# Patient Record
Sex: Female | Born: 1945 | Race: Asian | Hispanic: No | Smoking: Former smoker
Health system: Southern US, Community
[De-identification: ages and names within clinical notes are randomized; demographics above are authoritative.]

## PROBLEM LIST (undated history)

## (undated) DIAGNOSIS — N8110 Cystocele, unspecified: Secondary | ICD-10-CM

## (undated) DIAGNOSIS — M1990 Unspecified osteoarthritis, unspecified site: Secondary | ICD-10-CM

## (undated) DIAGNOSIS — B019 Varicella without complication: Secondary | ICD-10-CM

## (undated) DIAGNOSIS — A770 Spotted fever due to Rickettsia rickettsii: Secondary | ICD-10-CM

## (undated) DIAGNOSIS — N952 Postmenopausal atrophic vaginitis: Secondary | ICD-10-CM

## (undated) DIAGNOSIS — N83209 Unspecified ovarian cyst, unspecified side: Secondary | ICD-10-CM

## (undated) DIAGNOSIS — D649 Anemia, unspecified: Secondary | ICD-10-CM

## (undated) DIAGNOSIS — N760 Acute vaginitis: Secondary | ICD-10-CM

## (undated) DIAGNOSIS — K219 Gastro-esophageal reflux disease without esophagitis: Secondary | ICD-10-CM

## (undated) DIAGNOSIS — M249 Joint derangement, unspecified: Secondary | ICD-10-CM

## (undated) DIAGNOSIS — E785 Hyperlipidemia, unspecified: Secondary | ICD-10-CM

## (undated) DIAGNOSIS — T7840XA Allergy, unspecified, initial encounter: Secondary | ICD-10-CM

## (undated) DIAGNOSIS — N281 Cyst of kidney, acquired: Secondary | ICD-10-CM

## (undated) DIAGNOSIS — R300 Dysuria: Secondary | ICD-10-CM

## (undated) DIAGNOSIS — Z8619 Personal history of other infectious and parasitic diseases: Secondary | ICD-10-CM

## (undated) DIAGNOSIS — N6019 Diffuse cystic mastopathy of unspecified breast: Secondary | ICD-10-CM

## (undated) DIAGNOSIS — D259 Leiomyoma of uterus, unspecified: Secondary | ICD-10-CM

## (undated) DIAGNOSIS — F418 Other specified anxiety disorders: Secondary | ICD-10-CM

## (undated) HISTORY — DX: Dysuria: R30.0

## (undated) HISTORY — DX: Unspecified osteoarthritis, unspecified site: M19.90

## (undated) HISTORY — DX: Cyst of kidney, acquired: N28.1

## (undated) HISTORY — DX: Anemia, unspecified: D64.9

## (undated) HISTORY — DX: Cystocele, unspecified: N81.10

## (undated) HISTORY — DX: Allergy, unspecified, initial encounter: T78.40XA

## (undated) HISTORY — DX: Diffuse cystic mastopathy of unspecified breast: N60.19

## (undated) HISTORY — DX: Postmenopausal atrophic vaginitis: N95.2

## (undated) HISTORY — DX: Personal history of other infectious and parasitic diseases: Z86.19

## (undated) HISTORY — DX: Varicella without complication: B01.9

## (undated) HISTORY — DX: Gastro-esophageal reflux disease without esophagitis: K21.9

## (undated) HISTORY — PX: WISDOM TOOTH EXTRACTION: SHX21

## (undated) HISTORY — DX: Spotted fever due to Rickettsia rickettsii: A77.0

## (undated) HISTORY — DX: Hyperlipidemia, unspecified: E78.5

## (undated) HISTORY — DX: Leiomyoma of uterus, unspecified: D25.9

## (undated) HISTORY — DX: Unspecified ovarian cyst, unspecified side: N83.209

## (undated) HISTORY — DX: Joint derangement, unspecified: M24.9

## (undated) HISTORY — DX: Acute vaginitis: N76.0

---
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
## 1990-07-09 HISTORY — PX: HERNIA REPAIR: SHX51

## 1992-07-09 HISTORY — PX: OTHER SURGICAL HISTORY: SHX169

## 1998-12-20 ENCOUNTER — Other Ambulatory Visit: Admission: RE | Admit: 1998-12-20 | Discharge: 1998-12-20 | Payer: Self-pay | Admitting: *Deleted

## 1999-02-02 ENCOUNTER — Other Ambulatory Visit: Admission: RE | Admit: 1999-02-02 | Discharge: 1999-02-02 | Payer: Self-pay | Admitting: *Deleted

## 2000-01-25 ENCOUNTER — Other Ambulatory Visit: Admission: RE | Admit: 2000-01-25 | Discharge: 2000-01-25 | Payer: Self-pay | Admitting: *Deleted

## 2000-08-27 ENCOUNTER — Other Ambulatory Visit: Admission: RE | Admit: 2000-08-27 | Discharge: 2000-08-27 | Payer: Self-pay | Admitting: *Deleted

## 2001-05-15 ENCOUNTER — Other Ambulatory Visit: Admission: RE | Admit: 2001-05-15 | Discharge: 2001-05-15 | Payer: Self-pay | Admitting: *Deleted

## 2002-06-01 ENCOUNTER — Other Ambulatory Visit: Admission: RE | Admit: 2002-06-01 | Discharge: 2002-06-01 | Payer: Self-pay | Admitting: *Deleted

## 2003-08-23 ENCOUNTER — Ambulatory Visit (HOSPITAL_COMMUNITY): Admission: RE | Admit: 2003-08-23 | Discharge: 2003-08-23 | Payer: Self-pay | Admitting: Gastroenterology

## 2008-07-09 LAB — HM PAP SMEAR

## 2010-10-16 LAB — CBC AND DIFFERENTIAL
HCT: 41 % (ref 36–46)
Hemoglobin: 13.5 g/dL (ref 12.0–16.0)
Platelets: 280 10*3/uL (ref 150–399)
WBC: 5.4 10^3/mL

## 2010-10-16 LAB — LIPID PANEL
HDL: 92 mg/dL — AB (ref 35–70)
LDL Cholesterol: 150 mg/dL

## 2011-04-20 LAB — BASIC METABOLIC PANEL
BUN: 9 mg/dL (ref 4–21)
Glucose: 75 mg/dL

## 2011-09-13 ENCOUNTER — Encounter: Payer: Self-pay | Admitting: Family Medicine

## 2011-09-13 ENCOUNTER — Ambulatory Visit (INDEPENDENT_AMBULATORY_CARE_PROVIDER_SITE_OTHER): Payer: Medicare Other | Admitting: Family Medicine

## 2011-09-13 ENCOUNTER — Other Ambulatory Visit (HOSPITAL_COMMUNITY)
Admission: RE | Admit: 2011-09-13 | Discharge: 2011-09-13 | Disposition: A | Discharge status: Home / Self Care | Payer: Medicare Other | Source: Ambulatory Visit | Attending: Family Medicine | Admitting: Family Medicine

## 2011-09-13 VITALS — BP 111/74 | HR 82 | Temp 98.8°F | Ht 64.75 in | Wt 109.8 lb

## 2011-09-13 DIAGNOSIS — M810 Age-related osteoporosis without current pathological fracture: Secondary | ICD-10-CM | POA: Insufficient documentation

## 2011-09-13 DIAGNOSIS — R300 Dysuria: Secondary | ICD-10-CM

## 2011-09-13 DIAGNOSIS — N83209 Unspecified ovarian cyst, unspecified side: Secondary | ICD-10-CM

## 2011-09-13 DIAGNOSIS — E785 Hyperlipidemia, unspecified: Secondary | ICD-10-CM | POA: Diagnosis not present

## 2011-09-13 DIAGNOSIS — M249 Joint derangement, unspecified: Secondary | ICD-10-CM

## 2011-09-13 DIAGNOSIS — Z124 Encounter for screening for malignant neoplasm of cervix: Secondary | ICD-10-CM | POA: Insufficient documentation

## 2011-09-13 DIAGNOSIS — N760 Acute vaginitis: Secondary | ICD-10-CM

## 2011-09-13 DIAGNOSIS — N8110 Cystocele, unspecified: Secondary | ICD-10-CM

## 2011-09-13 DIAGNOSIS — N6019 Diffuse cystic mastopathy of unspecified breast: Secondary | ICD-10-CM

## 2011-09-13 DIAGNOSIS — N8111 Cystocele, midline: Secondary | ICD-10-CM

## 2011-09-13 DIAGNOSIS — D649 Anemia, unspecified: Secondary | ICD-10-CM

## 2011-09-13 DIAGNOSIS — N952 Postmenopausal atrophic vaginitis: Secondary | ICD-10-CM

## 2011-09-13 DIAGNOSIS — T7840XA Allergy, unspecified, initial encounter: Secondary | ICD-10-CM

## 2011-09-13 DIAGNOSIS — K219 Gastro-esophageal reflux disease without esophagitis: Secondary | ICD-10-CM | POA: Insufficient documentation

## 2011-09-13 HISTORY — DX: Cystocele, unspecified: N81.10

## 2011-09-13 HISTORY — DX: Acute vaginitis: N76.0

## 2011-09-13 HISTORY — DX: Joint derangement, unspecified: M24.9

## 2011-09-13 HISTORY — DX: Dysuria: R30.0

## 2011-09-13 LAB — POCT URINALYSIS DIPSTICK
Ketones, UA: NEGATIVE
Leukocytes, UA: NEGATIVE
Nitrite, UA: NEGATIVE
Protein, UA: NEGATIVE
Urobilinogen, UA: 0.2

## 2011-09-13 MED ORDER — LORATADINE 10 MG PO TABS: 10.0000 mg | ORAL_TABLET | Freq: Every day | ORAL | Status: DC | PRN

## 2011-09-13 NOTE — Assessment & Plan Note (Signed)
3-4 months of yellowish vaginal discharge, sometimes causes mild discomfort. Is not sexually active, no pap in 2-3 years. Pap and ancillary testing performed today

## 2011-09-13 NOTE — Assessment & Plan Note (Signed)
Noted on PE and patient noted urinary hesitancy and urgency, will refer to GYN for further evaluation

## 2011-09-13 NOTE — Patient Instructions (Signed)
Preventive Care for Adults, Female A healthy lifestyle and preventive care can promote health and wellness. Preventive health guidelines for women include the following key practices.  A routine yearly physical is a good way to check with your caregiver about your health and preventive screening. It is a chance to share any concerns and updates on your health, and to receive a thorough exam.   Visit your dentist for a routine exam and preventive care every 6 months. Brush your teeth twice a day and floss once a day. Good oral hygiene prevents tooth decay and gum disease.   The frequency of eye exams is based on your age, health, family medical history, use of contact lenses, and other factors. Follow your caregiver's recommendations for frequency of eye exams.   Eat a healthy diet. Foods like vegetables, fruits, whole grains, low-fat dairy products, and lean protein foods contain the nutrients you need without too many calories. Decrease your intake of foods high in solid fats, added sugars, and salt. Eat the right amount of calories for you.Get information about a proper diet from your caregiver, if necessary.   Regular physical exercise is one of the most important things you can do for your health. Most adults should get at least 150 minutes of moderate-intensity exercise (any activity that increases your heart rate and causes you to sweat) each week. In addition, most adults need muscle-strengthening exercises on 2 or more days a week.   Maintain a healthy weight. The body mass index (BMI) is a screening tool to identify possible weight problems. It provides an estimate of body fat based on height and weight. Your caregiver can help determine your BMI, and can help you achieve or maintain a healthy weight.For adults 20 years and older:   A BMI below 18.5 is considered underweight.   A BMI of 18.5 to 24.9 is normal.   A BMI of 25 to 29.9 is considered overweight.   A BMI of 30 and above is  considered obese.   Maintain normal blood lipids and cholesterol levels by exercising and minimizing your intake of saturated fat. Eat a balanced diet with plenty of fruit and vegetables. Blood tests for lipids and cholesterol should begin at age 20 and be repeated every 5 years. If your lipid or cholesterol levels are high, you are over 50, or you are at high risk for heart disease, you may need your cholesterol levels checked more frequently.Ongoing high lipid and cholesterol levels should be treated with medicines if diet and exercise are not effective.   If you smoke, find out from your caregiver how to quit. If you do not use tobacco, do not start.   If you are pregnant, do not drink alcohol. If you are breastfeeding, be very cautious about drinking alcohol. If you are not pregnant and choose to drink alcohol, do not exceed 1 drink per day. One drink is considered to be 12 ounces (355 mL) of beer, 5 ounces (148 mL) of wine, or 1.5 ounces (44 mL) of liquor.   Avoid use of street drugs. Do not share needles with anyone. Ask for help if you need support or instructions about stopping the use of drugs.   High blood pressure causes heart disease and increases the risk of stroke. Your blood pressure should be checked at least every 1 to 2 years. Ongoing high blood pressure should be treated with medicines if weight loss and exercise are not effective.   If you are 55 to 66   years old, ask your caregiver if you should take aspirin to prevent strokes.   Diabetes screening involves taking a blood sample to check your fasting blood sugar level. This should be done once every 3 years, after age 45, if you are within normal weight and without risk factors for diabetes. Testing should be considered at a younger age or be carried out more frequently if you are overweight and have at least 1 risk factor for diabetes.   Breast cancer screening is essential preventive care for women. You should practice "breast  self-awareness." This means understanding the normal appearance and feel of your breasts and may include breast self-examination. Any changes detected, no matter how small, should be reported to a caregiver. Women in their 20s and 30s should have a clinical breast exam (CBE) by a caregiver as part of a regular health exam every 1 to 3 years. After age 40, women should have a CBE every year. Starting at age 40, women should consider having a mammography (breast X-ray test) every year. Women who have a family history of breast cancer should talk to their caregiver about genetic screening. Women at a high risk of breast cancer should talk to their caregivers about having magnetic resonance imaging (MRI) and a mammography every year.   The Pap test is a screening test for cervical cancer. A Pap test can show cell changes on the cervix that might become cervical cancer if left untreated. A Pap test is a procedure in which cells are obtained and examined from the lower end of the uterus (cervix).   Women should have a Pap test starting at age 21.   Between ages 21 and 29, Pap tests should be repeated every 2 years.   Beginning at age 30, you should have a Pap test every 3 years as long as the past 3 Pap tests have been normal.   Some women have medical problems that increase the chance of getting cervical cancer. Talk to your caregiver about these problems. It is especially important to talk to your caregiver if a new problem develops soon after your last Pap test. In these cases, your caregiver may recommend more frequent screening and Pap tests.   The above recommendations are the same for women who have or have not gotten the vaccine for human papillomavirus (HPV).   If you had a hysterectomy for a problem that was not cancer or a condition that could lead to cancer, then you no longer need Pap tests. Even if you no longer need a Pap test, a regular exam is a good idea to make sure no other problems are  starting.   If you are between ages 65 and 70, and you have had normal Pap tests going back 10 years, you no longer need Pap tests. Even if you no longer need a Pap test, a regular exam is a good idea to make sure no other problems are starting.   If you have had past treatment for cervical cancer or a condition that could lead to cancer, you need Pap tests and screening for cancer for at least 20 years after your treatment.   If Pap tests have been discontinued, risk factors (such as a new sexual partner) need to be reassessed to determine if screening should be resumed.   The HPV test is an additional test that may be used for cervical cancer screening. The HPV test looks for the virus that can cause the cell changes on the cervix.   The cells collected during the Pap test can be tested for HPV. The HPV test could be used to screen women aged 30 years and older, and should be used in women of any age who have unclear Pap test results. After the age of 30, women should have HPV testing at the same frequency as a Pap test.   Colorectal cancer can be detected and often prevented. Most routine colorectal cancer screening begins at the age of 50 and continues through age 75. However, your caregiver may recommend screening at an earlier age if you have risk factors for colon cancer. On a yearly basis, your caregiver may provide home test kits to check for hidden blood in the stool. Use of a small camera at the end of a tube, to directly examine the colon (sigmoidoscopy or colonoscopy), can detect the earliest forms of colorectal cancer. Talk to your caregiver about this at age 50, when routine screening begins. Direct examination of the colon should be repeated every 5 to 10 years through age 75, unless early forms of pre-cancerous polyps or small growths are found.   Hepatitis C blood testing is recommended for all people born from 1945 through 1965 and any individual with known risks for hepatitis C.    Practice safe sex. Use condoms and avoid high-risk sexual practices to reduce the spread of sexually transmitted infections (STIs). STIs include gonorrhea, chlamydia, syphilis, trichomonas, herpes, HPV, and human immunodeficiency virus (HIV). Herpes, HIV, and HPV are viral illnesses that have no cure. They can result in disability, cancer, and death. Sexually active women aged 25 and younger should be checked for chlamydia. Older women with new or multiple partners should also be tested for chlamydia. Testing for other STIs is recommended if you are sexually active and at increased risk.   Osteoporosis is a disease in which the bones lose minerals and strength with aging. This can result in serious bone fractures. The risk of osteoporosis can be identified using a bone density scan. Women ages 65 and over and women at risk for fractures or osteoporosis should discuss screening with their caregivers. Ask your caregiver whether you should take a calcium supplement or vitamin D to reduce the rate of osteoporosis.   Menopause can be associated with physical symptoms and risks. Hormone replacement therapy is available to decrease symptoms and risks. You should talk to your caregiver about whether hormone replacement therapy is right for you.   Use sunscreen with sun protection factor (SPF) of 30 or more. Apply sunscreen liberally and repeatedly throughout the day. You should seek shade when your shadow is shorter than you. Protect yourself by wearing long sleeves, pants, a wide-brimmed hat, and sunglasses year round, whenever you are outdoors.   Once a month, do a whole body skin exam, using a mirror to look at the skin on your back. Notify your caregiver of new moles, moles that have irregular borders, moles that are larger than a pencil eraser, or moles that have changed in shape or color.   Stay current with required immunizations.   Influenza. You need a dose every fall (or winter). The composition of  the flu vaccine changes each year, so being vaccinated once is not enough.   Pneumococcal polysaccharide. You need 1 to 2 doses if you smoke cigarettes or if you have certain chronic medical conditions. You need 1 dose at age 65 (or older) if you have never been vaccinated.   Tetanus, diphtheria, pertussis (Tdap, Td). Get 1 dose of   Tdap vaccine if you are younger than age 65, are over 65 and have contact with an infant, are a healthcare worker, are pregnant, or simply want to be protected from whooping cough. After that, you need a Td booster dose every 10 years. Consult your caregiver if you have not had at least 3 tetanus and diphtheria-containing shots sometime in your life or have a deep or dirty wound.   HPV. You need this vaccine if you are a woman age 26 or younger. The vaccine is given in 3 doses over 6 months.   Measles, mumps, rubella (MMR). You need at least 1 dose of MMR if you were born in 1957 or later. You may also need a second dose.   Meningococcal. If you are age 19 to 21 and a first-year college student living in a residence hall, or have one of several medical conditions, you need to get vaccinated against meningococcal disease. You may also need additional booster doses.   Zoster (shingles). If you are age 60 or older, you should get this vaccine.   Varicella (chickenpox). If you have never had chickenpox or you were vaccinated but received only 1 dose, talk to your caregiver to find out if you need this vaccine.   Hepatitis A. You need this vaccine if you have a specific risk factor for hepatitis A virus infection or you simply wish to be protected from this disease. The vaccine is usually given as 2 doses, 6 to 18 months apart.   Hepatitis B. You need this vaccine if you have a specific risk factor for hepatitis B virus infection or you simply wish to be protected from this disease. The vaccine is given in 3 doses, usually over 6 months.  Preventive Services /  Frequency Ages 19 to 39  Blood pressure check.** / Every 1 to 2 years.   Lipid and cholesterol check.** / Every 5 years beginning at age 20.   Clinical breast exam.** / Every 3 years for women in their 20s and 30s.   Pap test.** / Every 2 years from ages 21 through 29. Every 3 years starting at age 30 through age 65 or 70 with a history of 3 consecutive normal Pap tests.   HPV screening.** / Every 3 years from ages 30 through ages 65 to 70 with a history of 3 consecutive normal Pap tests.   Hepatitis C blood test.** / For any individual with known risks for hepatitis C.   Skin self-exam. / Monthly.   Influenza immunization.** / Every year.   Pneumococcal polysaccharide immunization.** / 1 to 2 doses if you smoke cigarettes or if you have certain chronic medical conditions.   Tetanus, diphtheria, pertussis (Tdap, Td) immunization. / A one-time dose of Tdap vaccine. After that, you need a Td booster dose every 10 years.   HPV immunization. / 3 doses over 6 months, if you are 26 and younger.   Measles, mumps, rubella (MMR) immunization. / You need at least 1 dose of MMR if you were born in 1957 or later. You may also need a second dose.   Meningococcal immunization. / 1 dose if you are age 19 to 21 and a first-year college student living in a residence hall, or have one of several medical conditions, you need to get vaccinated against meningococcal disease. You may also need additional booster doses.   Varicella immunization.** / Consult your caregiver.   Hepatitis A immunization.** / Consult your caregiver. 2 doses, 6 to 18 months   apart.   Hepatitis B immunization.** / Consult your caregiver. 3 doses usually over 6 months.  Ages 40 to 64  Blood pressure check.** / Every 1 to 2 years.   Lipid and cholesterol check.** / Every 5 years beginning at age 20.   Clinical breast exam.** / Every year after age 40.   Mammogram.** / Every year beginning at age 40 and continuing for as  long as you are in good health. Consult with your caregiver.   Pap test.** / Every 3 years starting at age 30 through age 65 or 70 with a history of 3 consecutive normal Pap tests.   HPV screening.** / Every 3 years from ages 30 through ages 65 to 70 with a history of 3 consecutive normal Pap tests.   Fecal occult blood test (FOBT) of stool. / Every year beginning at age 50 and continuing until age 75. You may not need to do this test if you get a colonoscopy every 10 years.   Flexible sigmoidoscopy or colonoscopy.** / Every 5 years for a flexible sigmoidoscopy or every 10 years for a colonoscopy beginning at age 50 and continuing until age 75.   Hepatitis C blood test.** / For all people born from 1945 through 1965 and any individual with known risks for hepatitis C.   Skin self-exam. / Monthly.   Influenza immunization.** / Every year.   Pneumococcal polysaccharide immunization.** / 1 to 2 doses if you smoke cigarettes or if you have certain chronic medical conditions.   Tetanus, diphtheria, pertussis (Tdap, Td) immunization.** / A one-time dose of Tdap vaccine. After that, you need a Td booster dose every 10 years.   Measles, mumps, rubella (MMR) immunization. / You need at least 1 dose of MMR if you were born in 1957 or later. You may also need a second dose.   Varicella immunization.** / Consult your caregiver.   Meningococcal immunization.** / Consult your caregiver.   Hepatitis A immunization.** / Consult your caregiver. 2 doses, 6 to 18 months apart.   Hepatitis B immunization.** / Consult your caregiver. 3 doses, usually over 6 months.  Ages 65 and over  Blood pressure check.** / Every 1 to 2 years.   Lipid and cholesterol check.** / Every 5 years beginning at age 20.   Clinical breast exam.** / Every year after age 40.   Mammogram.** / Every year beginning at age 40 and continuing for as long as you are in good health. Consult with your caregiver.   Pap test.** /  Every 3 years starting at age 30 through age 65 or 70 with a 3 consecutive normal Pap tests. Testing can be stopped between 65 and 70 with 3 consecutive normal Pap tests and no abnormal Pap or HPV tests in the past 10 years.   HPV screening.** / Every 3 years from ages 30 through ages 65 or 70 with a history of 3 consecutive normal Pap tests. Testing can be stopped between 65 and 70 with 3 consecutive normal Pap tests and no abnormal Pap or HPV tests in the past 10 years.   Fecal occult blood test (FOBT) of stool. / Every year beginning at age 50 and continuing until age 75. You may not need to do this test if you get a colonoscopy every 10 years.   Flexible sigmoidoscopy or colonoscopy.** / Every 5 years for a flexible sigmoidoscopy or every 10 years for a colonoscopy beginning at age 50 and continuing until age 75.   Hepatitis   C blood test.** / For all people born from 1945 through 1965 and any individual with known risks for hepatitis C.   Osteoporosis screening.** / A one-time screening for women ages 65 and over and women at risk for fractures or osteoporosis.   Skin self-exam. / Monthly.   Influenza immunization.** / Every year.   Pneumococcal polysaccharide immunization.** / 1 dose at age 65 (or older) if you have never been vaccinated.   Tetanus, diphtheria, pertussis (Tdap, Td) immunization. / A one-time dose of Tdap vaccine if you are over 65 and have contact with an infant, are a healthcare worker, or simply want to be protected from whooping cough. After that, you need a Td booster dose every 10 years.   Varicella immunization.** / Consult your caregiver.   Meningococcal immunization.** / Consult your caregiver.   Hepatitis A immunization.** / Consult your caregiver. 2 doses, 6 to 18 months apart.   Hepatitis B immunization.** / Check with your caregiver. 3 doses, usually over 6 months.  ** Family history and personal history of risk and conditions may change your caregiver's  recommendations. Document Released: 08/21/2001 Document Revised: 06/14/2011 Document Reviewed: 11/20/2010 [employer] Patient Information 2012 [employer]. 

## 2011-09-14 ENCOUNTER — Encounter: Payer: Self-pay | Admitting: Family Medicine

## 2011-09-14 DIAGNOSIS — N83209 Unspecified ovarian cyst, unspecified side: Secondary | ICD-10-CM

## 2011-09-14 DIAGNOSIS — N952 Postmenopausal atrophic vaginitis: Secondary | ICD-10-CM

## 2011-09-14 DIAGNOSIS — N6019 Diffuse cystic mastopathy of unspecified breast: Secondary | ICD-10-CM

## 2011-09-14 HISTORY — DX: Unspecified ovarian cyst, unspecified side: N83.209

## 2011-09-14 HISTORY — DX: Diffuse cystic mastopathy of unspecified breast: N60.19

## 2011-09-14 HISTORY — DX: Postmenopausal atrophic vaginitis: N95.2

## 2011-09-14 NOTE — Progress Notes (Signed)
Patient ID: Kelsey [name], female   DOB: 1946-06-29, 66 y.o.   MRN: [id number] Kelsey [name] [id number] [birth year] 09/14/2011      Progress Note New Patient  Subjective  Chief Complaint  Chief Complaint  Patient presents with  . Establish Care    new patient    HPI  Patient is to use 57 female who is in today for nutrition appointment. Her largest concern is a roughly 3-4 month history of a creamy vaginal discharge. She denies odor, itching, pain. She denies fevers, chills. She's not sexually active. Denies any history of abnormal Paps. Has not had one in several years. She has had trouble with abnormal mammograms in the past mostly notable for dense tissue but never any cancer. 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 Other eye she reports generally good health she does struggle with some dyspepsia. Has a burping belching and burning feeling in her chest at times. She is very physically active and tries to keep well Past Medical History  Diagnosis Date  . Chicken pox as a child  . GERD (gastroesophageal reflux disease)   . Anemia     iron deficiency  . Hyperlipidemia   . Osteoporosis   . Allergy   . 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 H/O measles   . H/O mumps   . Female bladder prolapse 09/13/2011  . Vaginitis 09/13/2011  . Dysuria 09/13/2011    Past Surgical History  Procedure Date  . Small intestine hernia 1994    Family History  Problem Relation Age of Onset  . Heart disease Mother   . Osteoporosis Mother   . Cancer Father     prostate  . Osteoporosis Sister   . Gout Brother   . Gout Maternal Grandmother   . Heart disease Maternal Grandmother   . Alzheimer's disease Maternal Grandfather   . Osteoporosis Sister     History   Social History  . Marital Status: Married    Spouse Name: N/A    Number of Children: N/A  . Years of Education: N/A   Occupational History  . Not on file.   Social History Main Topics  . Smoking  status: Former Smoker -- 0.3 packs/day for 10 years    Types: Cigarettes    Quit date: 07/09/1978  . Smokeless tobacco: Never Used  . Alcohol Use: Yes     1-2 glasses of wine daily and a 12 oz beer daily  . Drug Use: No  . Sexually Active: No   Other Topics Concern  . Not on file   Social History Narrative  . No narrative on file    No current outpatient prescriptions on file prior to visit.    Allergies  Allergen Reactions  . Penicillins Rash  . Sulfa Antibiotics Rash    Under arm and thighs    Review of Systems  Review of Systems  Constitutional: Negative for fever, chills and malaise/fatigue.  HENT: Negative for hearing loss, nosebleeds and congestion.   Eyes: Negative for discharge.  Respiratory: Negative for cough, sputum production, shortness of breath and wheezing.   Cardiovascular: Negative for chest pain, palpitations and leg swelling.  Gastrointestinal: Positive for heartburn and nausea. Negative for vomiting, abdominal pain, diarrhea, constipation and blood in stool.  Genitourinary: Positive for urgency and frequency. Negative for dysuria and hematuria.       Change in stream, trouble initiating and flowing at a different angle  Musculoskeletal: Negative for myalgias, back pain and falls.  Skin: Negative for rash.  Neurological: Negative for dizziness, tremors, sensory change, focal weakness, loss of consciousness, weakness and headaches.  Endo/Heme/Allergies: Negative for polydipsia. Does not bruise/bleed easily.  Psychiatric/Behavioral: Negative for depression and suicidal ideas. The patient is not nervous/anxious and does not have insomnia.     Objective  BP 111/74  Pulse 82  Temp(Src) 98.8 F (37.1 C) (Temporal)  Ht 5' 4.75" (1.645 m)  Wt 109 lb 12.8 oz (49.805 kg)  BMI 18.41 kg/m2  SpO2 96%  Physical Exam  Physical Exam  Constitutional: She is oriented to person, place, and time and well-developed, well-nourished, and in no distress. No  distress.  HENT:  Head: Normocephalic and atraumatic.  Right Ear: External ear normal.  Left Ear: External ear normal.  Nose: Nose normal.  Mouth/Throat: Oropharynx is clear and moist. No oropharyngeal exudate.  Eyes: Conjunctivae are normal. Pupils are equal, round, and reactive to light. Right eye exhibits no discharge. Left eye exhibits no discharge. No scleral icterus.  Neck: Normal range of motion. Neck supple. No thyromegaly present.  Cardiovascular: Normal rate, regular rhythm, normal heart sounds and intact distal pulses.   No murmur heard. Pulmonary/Chest: Effort normal and breath sounds normal. No respiratory distress. She has no wheezes. She has no rales.  Abdominal: Soft. Bowel sounds are normal. She exhibits no distension and no mass. There is no tenderness.  Genitourinary: Uterus normal, cervix normal, right adnexa normal and left adnexa normal. Vaginal discharge found.       Yellowish vaginal discharge, bladder prolapse noted.   Musculoskeletal: Normal range of motion. She exhibits no edema and no tenderness.  Lymphadenopathy:    She has no cervical adenopathy.  Neurological: She is alert and oriented to person, place, and time. She has normal reflexes. No cranial nerve deficit. Coordination normal.  Skin: Skin is warm and dry. No rash noted. She is not diaphoretic.  Psychiatric: Mood, memory and affect normal.       Assessment & Plan  Vaginitis 3-4 months of yellowish vaginal discharge, sometimes causes mild discomfort. Is not sexually active, no pap in 2-3 years. Pap and ancillary testing performed today  Female bladder prolapse Noted on PE and patient noted urinary hesitancy and urgency, will refer to GYN for further evaluation  Hyperlipidemia Avoid trans fats and monitor lipid panel  Anemia Will check a CBC and advise after reviewing labs and old records which have been ordered today  Dysuria Check a UA and culture

## 2011-09-14 NOTE — Assessment & Plan Note (Signed)
Will check a CBC and advise after reviewing labs and old records which have been ordered today

## 2011-09-14 NOTE — Assessment & Plan Note (Signed)
Avoid trans fats and monitor lipid panel

## 2011-09-14 NOTE — Assessment & Plan Note (Addendum)
Check a UA and culture 

## 2011-09-24 MED ORDER — FLUCONAZOLE 150 MG PO TABS: 150.0000 mg | ORAL_TABLET | Freq: Once | ORAL | Status: DC

## 2011-09-24 NOTE — Progress Notes (Signed)
Addended by: Court Joy on: 09/24/2011 10:44 AM   Modules accepted: Orders

## 2011-09-27 ENCOUNTER — Encounter: Payer: Self-pay | Admitting: Obstetrics and Gynecology

## 2011-10-01 ENCOUNTER — Encounter (INDEPENDENT_AMBULATORY_CARE_PROVIDER_SITE_OTHER): Payer: Medicare Other | Admitting: Obstetrics and Gynecology

## 2011-10-01 DIAGNOSIS — N952 Postmenopausal atrophic vaginitis: Secondary | ICD-10-CM | POA: Diagnosis not present

## 2011-10-17 ENCOUNTER — Ambulatory Visit: Payer: Medicare Other | Admitting: Family Medicine

## 2011-10-24 ENCOUNTER — Encounter: Payer: Self-pay | Admitting: Family Medicine

## 2011-10-24 DIAGNOSIS — M1990 Unspecified osteoarthritis, unspecified site: Secondary | ICD-10-CM | POA: Insufficient documentation

## 2011-11-15 DIAGNOSIS — M810 Age-related osteoporosis without current pathological fracture: Secondary | ICD-10-CM | POA: Diagnosis not present

## 2011-11-16 ENCOUNTER — Other Ambulatory Visit: Payer: Self-pay

## 2011-11-16 MED ORDER — ESTRADIOL 0.1 MG/GM VA CREA: TOPICAL_CREAM | VAGINAL | Status: DC

## 2011-11-16 NOTE — Telephone Encounter (Signed)
Fax Rx for E[facility]ace [facility]   ld

## 2011-11-28 ENCOUNTER — Ambulatory Visit (INDEPENDENT_AMBULATORY_CARE_PROVIDER_SITE_OTHER): Payer: Medicare Other | Admitting: Family Medicine

## 2011-11-28 ENCOUNTER — Encounter: Payer: Self-pay | Admitting: Family Medicine

## 2011-11-28 VITALS — BP 98/67 | HR 64 | Temp 98.1°F | Ht 64.75 in | Wt 105.8 lb

## 2011-11-28 DIAGNOSIS — M810 Age-related osteoporosis without current pathological fracture: Secondary | ICD-10-CM | POA: Diagnosis not present

## 2011-11-28 DIAGNOSIS — N760 Acute vaginitis: Secondary | ICD-10-CM

## 2011-11-28 DIAGNOSIS — IMO0001 Reserved for inherently not codable concepts without codable children: Secondary | ICD-10-CM

## 2011-11-28 DIAGNOSIS — R300 Dysuria: Secondary | ICD-10-CM

## 2011-11-28 DIAGNOSIS — R634 Abnormal weight loss: Secondary | ICD-10-CM

## 2011-11-28 DIAGNOSIS — R109 Unspecified abdominal pain: Secondary | ICD-10-CM | POA: Diagnosis not present

## 2011-11-28 DIAGNOSIS — E785 Hyperlipidemia, unspecified: Secondary | ICD-10-CM | POA: Diagnosis not present

## 2011-11-28 DIAGNOSIS — K219 Gastro-esophageal reflux disease without esophagitis: Secondary | ICD-10-CM

## 2011-11-28 LAB — CBC
HCT: 37.6 % (ref 36.0–46.0)
Hemoglobin: 12.6 g/dL (ref 12.0–15.0)
RBC: 3.89 Mil/uL (ref 3.87–5.11)
WBC: 4.8 10*3/uL (ref 4.5–10.5)

## 2011-11-28 LAB — POCT URINALYSIS DIPSTICK
Ketones, UA: NEGATIVE
Leukocytes, UA: NEGATIVE
Nitrite, UA: NEGATIVE
Protein, UA: NEGATIVE
Urobilinogen, UA: 0.2
pH, UA: 7.5

## 2011-11-28 LAB — RENAL FUNCTION PANEL
BUN: 11 mg/dL (ref 6–23)
CO2: 30 mEq/L (ref 19–32)
Calcium: 9 mg/dL (ref 8.4–10.5)
Chloride: 105 mEq/L (ref 96–112)
Creatinine, Ser: 0.5 mg/dL (ref 0.4–1.2)
Sodium: 142 mEq/L (ref 135–145)

## 2011-11-28 LAB — HEPATIC FUNCTION PANEL
ALT: 22 U/L (ref 0–35)
AST: 31 U/L (ref 0–37)
Alkaline Phosphatase: 44 U/L (ref 39–117)
Total Bilirubin: 0.7 mg/dL (ref 0.3–1.2)

## 2011-11-28 LAB — LIPID PANEL
Cholesterol: 240 mg/dL — ABNORMAL HIGH (ref 0–200)
Total CHOL/HDL Ratio: 2

## 2011-11-28 MED ORDER — OMEPRAZOLE 20 MG PO CPDR: 20.0000 mg | DELAYED_RELEASE_CAPSULE | Freq: Every day | ORAL | Status: DC

## 2011-11-28 NOTE — Assessment & Plan Note (Signed)
Has been having Dexa scans annually due to her osteoporosis and active treatment, She used Reclast which was last administered in October of 2011 and has been using Prolia 60 mg Alma q 6 months since. Last Dexa scan was 01/03/2011 at [facility] and she would like to return there so we will arrange this

## 2011-11-28 NOTE — Progress Notes (Signed)
Patient ID: Kelsey [name], female   DOB: [birth year], 66 y.o.   MRN: [id number] Kelsey [name] [id number] 11-Dec-1945 11/28/2011      Progress Note-Follow Up  Subjective  Chief Complaint  Chief Complaint  Patient presents with  . Annual Exam    physical / no pap    HPI  Patient is a 52 from old female who is in today for followup on her new patient appointment. She is feeling better at this time. She was struggling with vaginitis and some have a yeast infection about appointment. She never took a Diflucan but she was referred to GYN for evaluation of possible bladder prolapse symptoms and they are now treating her for atrophic vaginitis and her symptoms have resolved. She complains of occasional low back pain upon rising but nothing severe. She is presently using polio shots every 6 months for her osteoporosis and is in need of a DEXA scan in June. Has not had any recent illness, fevers, chills, chest pain, palpitations, GI or GU complaints since she was last seen at our office.  Past Medical History  Diagnosis Date  . Chicken pox as a child  . GERD (gastroesophageal reflux disease)   . Anemia     iron deficiency  . Hyperlipidemia   . Allergy   . 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 H/O measles   . H/O mumps   . Female bladder prolapse 09/13/2011  . Vaginitis 09/13/2011  . Dysuria 09/13/2011  . Ovarian cyst 09/14/2011  . Fibrocystic breast 09/14/2011  . 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 Osteoporosis     reclast in Oct  . Osteoarthrosis     right shoulder    Past Surgical History  Procedure Date  . Small intestine hernia 1994    Family History  Problem Relation Age of Onset  . Heart disease Mother   . Osteoporosis Mother   . Cancer Father     prostate  . Osteoporosis Sister   . Gout Brother   . Gout Maternal Grandmother   . Heart disease Maternal Grandmother   . Alzheimer's disease Maternal Grandfather   . Osteoporosis Sister     History   Social History  . Marital  Status: Married    Spouse Name: N/A    Number of Children: N/A  . Years of Education: N/A   Occupational History  . Not on file.   Social History Main Topics  . Smoking status: Former Smoker -- 0.3 packs/day for 10 years    Types: Cigarettes    Quit date: 07/09/1978  . Smokeless tobacco: Never Used  . Alcohol Use: Yes     1-2 glasses of wine daily and a 12 oz beer daily  . Drug Use: No  . Sexually Active: No   Other Topics Concern  . Not on file   Social History Narrative  . No narrative on file    Current Outpatient Prescriptions on File Prior to Visit  Medication Sig Dispense Refill  . estradiol (ESTRACE VAGINAL) 0.1 MG/GM vaginal cream 1gr per vagina 3 x weekly  42.5 g  12  . Multiple Vitamin (MULTIVITAMIN) capsule Take 0.5 capsules by mouth daily.      . Omega-3 Fatty Acids (FISH OIL) 1000 MG CAPS Take 1 capsule by mouth daily.      Marland Kitchen DISCONTD: omeprazole (PRILOSEC) 20 MG capsule Take 20 mg by mouth daily.        Allergies  Allergen Reactions  . Penicillins Rash  . Sulfa Antibiotics  Rash    Under arm and thighs    Review of Systems  Review of Systems  Constitutional: Negative for fever, chills and malaise/fatigue.  HENT: Negative for hearing loss, nosebleeds and congestion.   Eyes: Negative for discharge.  Respiratory: Negative for cough, sputum production, shortness of breath and wheezing.   Cardiovascular: Negative for chest pain, palpitations and leg swelling.  Gastrointestinal: Negative for heartburn, nausea, vomiting, abdominal pain, diarrhea, constipation and blood in stool.  Genitourinary: Negative for dysuria, urgency, frequency and hematuria.  Musculoskeletal: Positive for back pain. Negative for myalgias and falls.       Low back pain upon arising, no radicular symptoms  Skin: Negative for rash.  Neurological: Negative for dizziness, tremors, sensory change, focal weakness, loss of consciousness, weakness and headaches.  Endo/Heme/Allergies:  Negative for polydipsia. Does not bruise/bleed easily.  Psychiatric/Behavioral: Negative for depression and suicidal ideas. The patient is not nervous/anxious and does not have insomnia.     Objective  BP 98/67  Pulse 64  Temp(Src) 98.1 F (36.7 C) (Temporal)  Ht 5' 4.75" (1.645 m)  Wt 105 lb 12.8 oz (47.991 kg)  BMI 17.74 kg/m2  SpO2 97%  Physical Exam  Physical Exam  Constitutional: She is oriented to person, place, and time and well-developed, well-nourished, and in no distress. No distress.  HENT:  Head: Normocephalic and atraumatic.  Right Ear: External ear normal.  Left Ear: External ear normal.  Nose: Nose normal.  Mouth/Throat: Oropharynx is clear and moist. No oropharyngeal exudate.  Eyes: Conjunctivae are normal. Pupils are equal, round, and reactive to light. Right eye exhibits no discharge. Left eye exhibits no discharge. No scleral icterus.  Neck: Normal range of motion. Neck supple. No thyromegaly present.  Cardiovascular: Normal rate, regular rhythm, normal heart sounds and intact distal pulses.   No murmur heard. Pulmonary/Chest: Effort normal and breath sounds normal. No respiratory distress. She has no wheezes. She has no rales.  Abdominal: Soft. Bowel sounds are normal. She exhibits no distension and no mass. There is no tenderness.  Musculoskeletal: Normal range of motion. She exhibits no edema and no tenderness.  Lymphadenopathy:    She has no cervical adenopathy.  Neurological: She is alert and oriented to person, place, and time. She has normal reflexes. No cranial nerve deficit. Coordination normal.  Skin: Skin is warm and dry. No rash noted. She is not diaphoretic.  Psychiatric: Mood, memory and affect normal.    No results found for this basename: TSH   Lab Results  Component Value Date   WBC 5.4 10/16/2010   HGB 13.5 10/16/2010   HCT 41 10/16/2010   PLT 280 10/16/2010   Lab Results  Component Value Date   CREATININE 0.7 04/20/2011   BUN 9  04/20/2011   NA 139 04/20/2011   K 4.9 04/20/2011   No results found for this basename: ALT, AST, GGT, ALKPHOS, BILITOT   Lab Results  Component Value Date   CHOL 258* 10/16/2010   Lab Results  Component Value Date   HDL 92* 10/16/2010   Lab Results  Component Value Date   LDLCALC 150 10/16/2010   Lab Results  Component Value Date   TRIG 77 10/16/2010   No results found for this basename: CHOLHDL     Assessment & Plan  Osteoporosis Has been having Dexa scans annually due to her osteoporosis and active treatment, She used Reclast which was last administered in October of 2011 and has been using Prolia 60 mg  q 6  months since. Last Dexa scan was 01/03/2011 at [facility] and she would like to return there so we will arrange this  Vaginitis Improved despite never having taken her diflucan. She is now using Estrace cream and following with gynecologist. She is now feeling better and has a follow up appt with them  Dysuria No symptoms at present but patient requests a repat UA  GERD (gastroesophageal reflux disease) Patient tried to decrease her Omeprazole use but did not tolerate this. Had increased abdominal bloating, CP, dyspepsia. Will refer back to her Gastroenterologist for further evaluation and she is given a refill on the Omeprazole  Hyperlipidemia Last year total choleserol was 258. Will repeat panel today.

## 2011-11-28 NOTE — Assessment & Plan Note (Signed)
No symptoms at present but patient requests a repat UA

## 2011-11-28 NOTE — Assessment & Plan Note (Signed)
Improved despite never having taken her diflucan. She is now using Estrace cream and following with gynecologist. She is now feeling better and has a follow up appt with them

## 2011-11-28 NOTE — Assessment & Plan Note (Signed)
Patient tried to decrease her Omeprazole use but did not tolerate this. Had increased abdominal bloating, CP, dyspepsia. Will refer back to her Gastroenterologist for further evaluation and she is given a refill on the Omeprazole

## 2011-11-28 NOTE — Assessment & Plan Note (Signed)
Last year total choleserol was 258. Will repeat panel today.

## 2011-11-28 NOTE — Patient Instructions (Signed)

## 2011-11-29 ENCOUNTER — Other Ambulatory Visit: Payer: Self-pay | Admitting: Family Medicine

## 2011-11-29 LAB — VITAMIN D 25 HYDROXY (VIT D DEFICIENCY, FRACTURES): Vit D, 25-Hydroxy: 32 ng/mL (ref 30–89)

## 2011-11-30 NOTE — Progress Notes (Signed)
Addended by: Baldemar Lenis R on: 11/30/2011 10:21 AM   Modules accepted: Orders

## 2011-11-30 NOTE — Progress Notes (Signed)
Addended by: Baldemar Lenis R on: 11/30/2011 04:44 PM   Modules accepted: Orders

## 2011-11-30 NOTE — Progress Notes (Signed)
Addended by: Baldemar Lenis R on: 11/30/2011 09:40 AM   Modules accepted: Orders

## 2011-12-04 ENCOUNTER — Telehealth: Payer: Self-pay | Admitting: Family Medicine

## 2011-12-04 NOTE — Telephone Encounter (Signed)
Patient is requesting a printed copy of her 11/28/11 lab results. She will pickup 12/05/11

## 2011-12-04 NOTE — Telephone Encounter (Signed)
Printed

## 2011-12-05 ENCOUNTER — Other Ambulatory Visit: Payer: Self-pay | Admitting: Family Medicine

## 2011-12-05 NOTE — Progress Notes (Signed)
Patient with documented of osteoporosis. Has had medication changes over the past couple of years that have resulted in some improvement. She has started using Prolia. We will proceed with repeat Dexa scan to assess her response

## 2011-12-06 DIAGNOSIS — K219 Gastro-esophageal reflux disease without esophagitis: Secondary | ICD-10-CM | POA: Diagnosis not present

## 2011-12-06 DIAGNOSIS — R1013 Epigastric pain: Secondary | ICD-10-CM | POA: Diagnosis not present

## 2011-12-06 DIAGNOSIS — R634 Abnormal weight loss: Secondary | ICD-10-CM | POA: Diagnosis not present

## 2011-12-06 DIAGNOSIS — R141 Gas pain: Secondary | ICD-10-CM | POA: Diagnosis not present

## 2011-12-11 ENCOUNTER — Encounter: Payer: Self-pay | Admitting: Family Medicine

## 2012-01-04 ENCOUNTER — Other Ambulatory Visit: Payer: Medicare Other

## 2012-01-04 ENCOUNTER — Telehealth: Payer: Self-pay

## 2012-01-04 DIAGNOSIS — M810 Age-related osteoporosis without current pathological fracture: Secondary | ICD-10-CM | POA: Diagnosis not present

## 2012-01-04 DIAGNOSIS — Z1231 Encounter for screening mammogram for malignant neoplasm of breast: Secondary | ICD-10-CM | POA: Diagnosis not present

## 2012-01-04 NOTE — Telephone Encounter (Signed)
I notified pt that Mammogram was normal and Bone Density showed Osteoporosis that we already knew about and there would be no change to her therapy. Pt stated understandment Mailed copies to pt per pts request

## 2012-01-14 ENCOUNTER — Encounter: Payer: Self-pay | Admitting: Family Medicine

## 2012-01-15 ENCOUNTER — Ambulatory Visit (INDEPENDENT_AMBULATORY_CARE_PROVIDER_SITE_OTHER): Payer: Medicare Other | Admitting: Obstetrics and Gynecology

## 2012-01-15 ENCOUNTER — Encounter: Payer: Self-pay | Admitting: Obstetrics and Gynecology

## 2012-01-15 VITALS — BP 88/42 | Wt 106.0 lb

## 2012-01-15 DIAGNOSIS — N952 Postmenopausal atrophic vaginitis: Secondary | ICD-10-CM | POA: Diagnosis not present

## 2012-01-15 DIAGNOSIS — Z719 Counseling, unspecified: Secondary | ICD-10-CM

## 2012-01-15 MED ORDER — ESTRADIOL 10 MCG VA TABS: 1.0000 | ORAL_TABLET | VAGINAL | Status: DC

## 2012-01-15 MED ORDER — ESTRADIOL ACETATE 0.05 MG/24HR VA RING: 1.0000 | VAGINAL_RING | VAGINAL | Status: DC

## 2012-01-15 NOTE — Progress Notes (Signed)
Subjective:  66 yo patient seen in March 2013, referred by Dr Reuel Derby at [facility] for sever vulvo-vaginal atrophy. Today pt is here for a 3 f/u on her estrace cream.pt stated that the estrace cream is working. Reports urinary flow is now normal.  Objective:  Pelvic Exam: Vulva and Vagina: much improved with pink mucosa and no petechia  Assessment:  Improved VV atrophy   Plan:  Estring every 3 months Follow-up annual exam

## 2012-02-05 DIAGNOSIS — L819 Disorder of pigmentation, unspecified: Secondary | ICD-10-CM | POA: Diagnosis not present

## 2012-03-18 DIAGNOSIS — L819 Disorder of pigmentation, unspecified: Secondary | ICD-10-CM | POA: Diagnosis not present

## 2012-09-25 ENCOUNTER — Other Ambulatory Visit: Payer: Self-pay | Admitting: Obstetrics and Gynecology

## 2012-09-25 DIAGNOSIS — Z124 Encounter for screening for malignant neoplasm of cervix: Secondary | ICD-10-CM | POA: Diagnosis not present

## 2012-09-25 DIAGNOSIS — N952 Postmenopausal atrophic vaginitis: Secondary | ICD-10-CM | POA: Diagnosis not present

## 2012-09-25 DIAGNOSIS — Z01419 Encounter for gynecological examination (general) (routine) without abnormal findings: Secondary | ICD-10-CM | POA: Diagnosis not present

## 2012-09-25 DIAGNOSIS — Z79890 Hormone replacement therapy (postmenopausal): Secondary | ICD-10-CM | POA: Diagnosis not present

## 2012-10-10 DIAGNOSIS — H2510 Age-related nuclear cataract, unspecified eye: Secondary | ICD-10-CM | POA: Diagnosis not present

## 2012-10-10 DIAGNOSIS — H5210 Myopia, unspecified eye: Secondary | ICD-10-CM | POA: Diagnosis not present

## 2012-10-14 DIAGNOSIS — N840 Polyp of corpus uteri: Secondary | ICD-10-CM | POA: Diagnosis not present

## 2012-10-14 DIAGNOSIS — N952 Postmenopausal atrophic vaginitis: Secondary | ICD-10-CM | POA: Diagnosis not present

## 2012-10-14 DIAGNOSIS — N950 Postmenopausal bleeding: Secondary | ICD-10-CM | POA: Diagnosis not present

## 2012-10-27 ENCOUNTER — Encounter: Payer: Self-pay | Admitting: Obstetrics and Gynecology

## 2012-10-27 ENCOUNTER — Other Ambulatory Visit: Payer: Self-pay | Admitting: Obstetrics and Gynecology

## 2012-10-30 ENCOUNTER — Encounter (HOSPITAL_COMMUNITY): Payer: Self-pay | Admitting: Pharmacist

## 2012-11-04 ENCOUNTER — Encounter (HOSPITAL_COMMUNITY): Payer: Self-pay

## 2012-11-04 ENCOUNTER — Encounter (HOSPITAL_COMMUNITY)
Admission: RE | Admit: 2012-11-04 | Discharge: 2012-11-04 | Disposition: A | Discharge status: Home / Self Care | Payer: Medicare Other | Source: Ambulatory Visit | Attending: Obstetrics and Gynecology | Admitting: Obstetrics and Gynecology

## 2012-11-04 DIAGNOSIS — N950 Postmenopausal bleeding: Secondary | ICD-10-CM | POA: Diagnosis not present

## 2012-11-04 DIAGNOSIS — N854 Malposition of uterus: Secondary | ICD-10-CM | POA: Diagnosis not present

## 2012-11-04 DIAGNOSIS — D259 Leiomyoma of uterus, unspecified: Secondary | ICD-10-CM | POA: Diagnosis not present

## 2012-11-04 DIAGNOSIS — R9389 Abnormal findings on diagnostic imaging of other specified body structures: Secondary | ICD-10-CM | POA: Diagnosis not present

## 2012-11-04 LAB — CBC
MCV: 95.6 fL (ref 78.0–100.0)
Platelets: 191 10*3/uL (ref 150–400)
RBC: 4.07 MIL/uL (ref 3.87–5.11)
RDW: 13.3 % (ref 11.5–15.5)
WBC: 5.1 10*3/uL (ref 4.0–10.5)

## 2012-11-04 NOTE — Pre-Procedure Instructions (Signed)
I attempted several times to obtain old EKG for Dr. Rodman Pickle to compare to today's abnormal EKG ( right axis deviation) but after following pt's info as to where/who did prior EKGs, I never could obtain a report. Dr. Rodman Pickle looked at EKG and "ok'd" it.

## 2012-11-04 NOTE — Patient Instructions (Addendum)
Your procedure is scheduled on:11/06/12  Enter through the Main Entrance at :10am  Pick up desk phone and dial [phone] and inform us of your arrival.  Please call [phone] if you have any problems the morning of surgery.  Remember: Do not eat or drink after midnight:WED.   Take these meds the morning of surgery with a sip of water:Omeprazole  DO NOT wear jewelry, eye make-up, lipstick,body lotion, or dark fingernail polish.   Patients discharged on the day of surgery will not be allowed to drive home.

## 2012-11-05 ENCOUNTER — Other Ambulatory Visit: Payer: Self-pay | Admitting: Obstetrics and Gynecology

## 2012-11-05 NOTE — H&P (Signed)
Reason for admission:  Postmenopausal bleeding with suspicion of endometrial polyp  HPI  This is a 67 year old Asian woman, G0, who was referred in July 2013 for excessive vaginal discharge which was due to severe vulvo-vaginal atrophy. Estrace cream was prescribed with good results after 3 months but patient desired to change to Estring for convenience. Femring was filled instead which patient used unopposed for 6 months with minimal spotting at initiation and breast soreness for 2 weeks, both now resolved. At annual visit an ultrasound was ordered to evaluate endometrium and revealed: uterus measuring 5.6 x 6.6 x 5.2 cm and 2 normal ovaries. There were 6 measurable fibroids with the largest at 4.4 cm and most between 1-2.5 cm. The endometrium was thickened at 17.26 mm with high suspicion of endometrial polyp. Recommendation was to proceed with hysteroscopy and resection of polyp.    Allergies  PENICILLINS: Rash SULFA (SULFONAMIDE ANTIBIOTICS): Rash  Medications    Name Date Source  Calcium 500 + D Take one tablet every day 09/25/12 Marissa Nestle  Estring 1 vaginal ring every 3 months 09/25/12 SR  multivitamin tablet take one tablet every day 09/25/12 Marissa Nestle  omega 3-dha-epa-fish oil 1,000 mg (120 mg-180 mg) capsule 09/25/12 Jeannie Martin  omeprazole 20 mg capsule,delayed release Take 1 capsule(s) every day by oral route. 09/25/12 Marissa Nestle   GYN History  Last Pap Smear: 09/25/12: normal Recent Bone Density: 01/04/2012 T Score -1.1. Recent Mammogram: 01/04/2012 Normal.  Past Medical History Non-contributory  Social History  Former smoker.(1 PPD) 10 YEARS.intake Moderate Wine/beer Moderate coffee/green tea Status: Married.  Family History Mother - Heart disease (died age: 9)   - Osteoporosis  Father - Carcinoma of prostate (died age: 71)  Sister - Osteoporosis  Maternal Aunt - Osteoporosis  Maternal Grandmother - Osteoporosis   - Heart disease   Brother - Gout   - Carcinoma of prostate  Maternal Grandfather - Alzheimer's disease   Surgical History Reviewed Surgical History   Surgery-Other - Wisdom Teeth   GI-Hernia Repair - 1994  Vitals 09/25/2012 02:33 pm Wt: 109 lbs  Ht: 5 ft 5 in BMI: 18.1  BP: 96/60       ROS Constitutional: Constitutional: no fever, fatigue, or significant weight loss/gain. Cardiovascular: Cardiovascular: no palpitations, SOB, orthopnea, edema, or chest pain. Gastrointestinal: Gastrointestinal: no heartburn, indigestion, nausea, vomiting, difficulty swallowing, abdominal pain, or bowel movement changes. Genitourinary: Genitourinary: no vaginal odor or itching and no hematuria, incontinence, rash, lesion, discharge, abnormal bleeding, flank pain, or trouble urinating. Endocrine: Endocrine: no endocrine symptoms. Menstrual cycle: no PMDD symptoms. Menopausal: no menopausal symptoms. Sexual problems: no sexual problems. Psychological: Psychiatric: no depression or alcoholism.  Physical Exam  Patient is a 67 year old female.  Chaperone: Chaperone: present. Constitutional: General Appearance: healthy-appearing, well-nourished, and well-developed. Psychiatric: Orientation: to time, place, and person. Mood and Affect: normal mood and affect and active and alert. Skin: Appearance: no rashes or lesions. Neck: Neck: supple, FROM, trachea midline, and no masses. Thyroid: no enlargement or nodules and non-tender. Lungs: Respiratory Effort: no intercostal retractions or accessory muscle usage. Auscultation: no wheezing, rales/crackles, or rhonchi and clear to auscultation. Cardiovascular: Auscultation: RRR and no murmur. Peripheral Vascular: no varicosities, LLE edema, RLE edema, calf tenderness, or palpable cords and pedal pulses intact. Abdomen: Auscultation/Inspection/Palpation: no tenderness, hepatomegaly, splenomegaly, masses, or CVA tenderness and soft, non-distended, and normal bowel sounds. Hernia: none  palpated. Breast: Inspection/Palpation: no tenderness, skin changes, abnormal secretions, or distinct masses and nipple appearance normal. Female Genitalia: Vulva: no masses,  atrophy, or lesions. Vagina: no tenderness, erythema, rectocele, abnormal vaginal discharge, or vesicle(s) or ulcers and cystocele (2/4); well vascularized mucosa without any signs of atrophy. Cervix: no discharge or cervical motion tenderness and grossly normal. Uterus: normal size and shape and midline, mobile, non-tender, and no uterine prolapse. Bladder/Urethra: no urethral discharge or mass and normal meatus, bladder non distended, and Urethra well supported. Adnexa/Parametria: no parametrial tenderness or mass and no adnexal tenderness or ovarian mass. Lymph Nodes: Palpation: non tender submandibular nodes, axillary nodes, or inguinal nodes. Rectal Exam: Rectum: normal perianal skin and sphincter tone and no hemorrhoids or masses.  Assessment / Plan  Thickened endometrium with suspicion of polyp Proceed with HSC / resection of polyp / D&C  HYSTEROSCOPY   The procedure was reviewed with patient with expected benefits. Risks including but not limited to bleeding, infection, uterine perforation with possible intra-abdominal organ damage and need to stay overnight +/- require exploratory laparoscopy were also reviewed. Pre-operative instructions were given to patient. Post-operative recovery and expectations were also discussed and all questions were answered.

## 2012-11-06 ENCOUNTER — Ambulatory Visit (HOSPITAL_COMMUNITY)
Admission: RE | Admit: 2012-11-06 | Discharge: 2012-11-06 | Disposition: A | Discharge status: Home / Self Care | Payer: Medicare Other | Source: Ambulatory Visit | Attending: Obstetrics and Gynecology | Admitting: Obstetrics and Gynecology

## 2012-11-06 ENCOUNTER — Encounter (HOSPITAL_COMMUNITY): Payer: Self-pay | Admitting: Anesthesiology

## 2012-11-06 ENCOUNTER — Ambulatory Visit (HOSPITAL_COMMUNITY): Payer: Medicare Other | Admitting: Anesthesiology

## 2012-11-06 ENCOUNTER — Encounter (HOSPITAL_COMMUNITY)
Admission: RE | Disposition: A | Discharge status: Home / Self Care | Payer: Self-pay | Source: Ambulatory Visit | Attending: Obstetrics and Gynecology

## 2012-11-06 DIAGNOSIS — D259 Leiomyoma of uterus, unspecified: Secondary | ICD-10-CM | POA: Diagnosis not present

## 2012-11-06 DIAGNOSIS — N950 Postmenopausal bleeding: Secondary | ICD-10-CM | POA: Insufficient documentation

## 2012-11-06 DIAGNOSIS — N854 Malposition of uterus: Secondary | ICD-10-CM | POA: Diagnosis not present

## 2012-11-06 DIAGNOSIS — R9389 Abnormal findings on diagnostic imaging of other specified body structures: Secondary | ICD-10-CM | POA: Insufficient documentation

## 2012-11-06 DIAGNOSIS — N840 Polyp of corpus uteri: Secondary | ICD-10-CM | POA: Diagnosis not present

## 2012-11-06 HISTORY — DX: Leiomyoma of uterus, unspecified: D25.9

## 2012-11-06 HISTORY — PX: DILATATION & CURRETTAGE/HYSTEROSCOPY WITH RESECTOCOPE: SHX5572

## 2012-11-06 SURGERY — DILATATION & CURETTAGE/HYSTEROSCOPY WITH RESECTOCOPE
Anesthesia: Spinal | Site: Vagina | Wound class: Clean Contaminated

## 2012-11-06 MED ORDER — IBUPROFEN 600 MG PO TABS: 600.0000 mg | ORAL_TABLET | Freq: Four times a day (QID) | ORAL | Status: DC | PRN

## 2012-11-06 MED ORDER — LACTATED RINGERS IV SOLN
INTRAVENOUS | Status: DC
  Administered 2012-11-06 (×2): via INTRAVENOUS

## 2012-11-06 MED ORDER — CLINDAMYCIN PHOSPHATE 600 MG/50ML IV SOLN
600.0000 mg | Freq: Once | INTRAVENOUS | Status: DC
  Filled 2012-11-06: qty 50

## 2012-11-06 MED ORDER — CIPROFLOXACIN IN D5W 200 MG/100ML IV SOLN
200.0000 mg | Freq: Once | INTRAVENOUS | Status: AC
  Administered 2012-11-06: 200 mg via INTRAVENOUS
  Filled 2012-11-06: qty 100

## 2012-11-06 MED ORDER — MIDAZOLAM HCL 5 MG/5ML IJ SOLN
INTRAMUSCULAR | Status: DC | PRN
  Administered 2012-11-06: 1 mg via INTRAVENOUS

## 2012-11-06 MED ORDER — KETOROLAC TROMETHAMINE 30 MG/ML IJ SOLN
INTRAMUSCULAR | Status: AC
  Filled 2012-11-06: qty 1

## 2012-11-06 MED ORDER — PROPOFOL 10 MG/ML IV EMUL
INTRAVENOUS | Status: AC
  Filled 2012-11-06: qty 20

## 2012-11-06 MED ORDER — FENTANYL CITRATE 0.05 MG/ML IJ SOLN: 25.0000 ug | INTRAMUSCULAR | Status: DC | PRN

## 2012-11-06 MED ORDER — PROPOFOL 10 MG/ML IV EMUL
INTRAVENOUS | Status: DC | PRN
  Administered 2012-11-06 (×16): 20 mg via INTRAVENOUS
  Administered 2012-11-06: 30 mg via INTRAVENOUS
  Administered 2012-11-06 (×3): 20 mg via INTRAVENOUS
  Administered 2012-11-06: 10 mg via INTRAVENOUS
  Administered 2012-11-06 (×3): 20 mg via INTRAVENOUS

## 2012-11-06 MED ORDER — FENTANYL CITRATE 0.05 MG/ML IJ SOLN
INTRAMUSCULAR | Status: DC | PRN
  Administered 2012-11-06 (×4): 50 ug via INTRAVENOUS

## 2012-11-06 MED ORDER — CHLOROPROCAINE HCL 1 % IJ SOLN
INTRAMUSCULAR | Status: DC | PRN
  Administered 2012-11-06: 20 mL

## 2012-11-06 MED ORDER — CHLOROPROCAINE HCL 1 % IJ SOLN
INTRAMUSCULAR | Status: AC
  Filled 2012-11-06: qty 30

## 2012-11-06 MED ORDER — CLINDAMYCIN PHOSPHATE 600 MG/50ML IV SOLN
600.0000 mg | Freq: Once | INTRAVENOUS | Status: AC
  Administered 2012-11-06: 600 mg via INTRAVENOUS
  Filled 2012-11-06: qty 50

## 2012-11-06 MED ORDER — FENTANYL CITRATE 0.05 MG/ML IJ SOLN
INTRAMUSCULAR | Status: AC
  Filled 2012-11-06: qty 5

## 2012-11-06 MED ORDER — EPHEDRINE 5 MG/ML INJ
INTRAVENOUS | Status: AC
  Filled 2012-11-06: qty 10

## 2012-11-06 MED ORDER — LIDOCAINE HCL (CARDIAC) 20 MG/ML IV SOLN
INTRAVENOUS | Status: DC | PRN
  Administered 2012-11-06: 40 mg via INTRAVENOUS

## 2012-11-06 MED ORDER — GLYCINE 1.5 % IR SOLN
Status: DC | PRN
  Administered 2012-11-06 (×5): 3000 mL

## 2012-11-06 MED ORDER — KETOROLAC TROMETHAMINE 30 MG/ML IJ SOLN
INTRAMUSCULAR | Status: DC | PRN
  Administered 2012-11-06: 30 mg via INTRAVENOUS

## 2012-11-06 MED ORDER — EPHEDRINE SULFATE 50 MG/ML IJ SOLN
INTRAMUSCULAR | Status: DC | PRN
  Administered 2012-11-06: 5 mg via INTRAVENOUS
  Administered 2012-11-06: 10 mg via INTRAVENOUS

## 2012-11-06 MED ORDER — MIDAZOLAM HCL 2 MG/2ML IJ SOLN
INTRAMUSCULAR | Status: AC
  Filled 2012-11-06: qty 2

## 2012-11-06 MED ORDER — LIDOCAINE HCL (CARDIAC) 20 MG/ML IV SOLN
INTRAVENOUS | Status: AC
  Filled 2012-11-06: qty 5

## 2012-11-06 SURGICAL SUPPLY — 20 items
BOOTIES KNEE HIGH SLOAN (MISCELLANEOUS) ×4 IMPLANT
CANISTER SUCTION 2500CC (MISCELLANEOUS) ×2 IMPLANT
CATH ROBINSON RED A/P 16FR (CATHETERS) ×2 IMPLANT
CLOTH BEACON ORANGE TIMEOUT ST (SAFETY) ×2 IMPLANT
CONTAINER PREFILL 10% NBF 60ML (FORM) ×4 IMPLANT
CORD ACTIVE DISPOSABLE (ELECTRODE) ×1
CORD ELECTRO ACTIVE DISP (ELECTRODE) ×1 IMPLANT
DRESSING TELFA 8X3 (GAUZE/BANDAGES/DRESSINGS) ×2 IMPLANT
ELECT LOOP GYNE PRO 24FR (CUTTING LOOP)
ELECT REM PT RETURN 9FT ADLT (ELECTROSURGICAL) ×2
ELECT VAPORTRODE GRVD BAR (ELECTRODE) IMPLANT
ELECTRODE LOOP GYNE PRO 24FR (CUTTING LOOP) IMPLANT
ELECTRODE REM PT RTRN 9FT ADLT (ELECTROSURGICAL) ×1 IMPLANT
GLOVE BIOGEL PI IND STRL 7.0 (GLOVE) ×1 IMPLANT
GLOVE BIOGEL PI INDICATOR 7.0 (GLOVE) ×1
GOWN STRL REIN XL XLG (GOWN DISPOSABLE) ×4 IMPLANT
PACK HYSTEROSCOPY LF (CUSTOM PROCEDURE TRAY) ×2 IMPLANT
PAD OB MATERNITY 4.3X12.25 (PERSONAL CARE ITEMS) ×2 IMPLANT
TOWEL OR 17X24 6PK STRL BLUE (TOWEL DISPOSABLE) ×2 IMPLANT
WATER STERILE IRR 1000ML POUR (IV SOLUTION) ×2 IMPLANT

## 2012-11-06 NOTE — Interval H&P Note (Signed)
History and Physical Interval Note:  11/06/2012 11:34 AM  Kelsey [name]  has presented today for surgery, with the diagnosis of Endometrial Polyps  The various methods of treatment have been discussed with the patient and family. After consideration of risks, benefits and other options for treatment, the patient has consented to  Procedure(s): DILATATION & CURETTAGE/HYSTEROSCOPY WITH RESECTOCOPE (N/A) as a surgical intervention .  The patient's history has been reviewed, patient examined, no change in status, stable for surgery.  I have reviewed the patient's chart and labs.  Questions were answered to the patient's satisfaction.     Kellan Raffield A

## 2012-11-06 NOTE — Transfer of Care (Signed)
Immediate Anesthesia Transfer of Care Note  Patient: Kelsey [name]  Procedure(s) Performed: Procedure(s): DILATATION & CURETTAGE/HYSTEROSCOPY WITH RESECTOCOPE (N/A)  Patient Location: PACU  Anesthesia Type:MAC  Level of Consciousness: awake, alert  and oriented  Airway & Oxygen Therapy: Patient Spontanous Breathing and Patient connected to nasal cannula oxygen  Post-op Assessment: Report given to PACU RN and Post -op Vital signs reviewed and stable  Post vital signs: Reviewed and stable  Complications: No apparent anesthesia complications

## 2012-11-06 NOTE — Anesthesia Postprocedure Evaluation (Signed)
  Anesthesia Post Note  Patient: Kelsey [name]  Procedure(s) Performed: Procedure(s) (LRB): DILATATION & CURETTAGE/HYSTEROSCOPY WITH RESECTOCOPE (N/A)  Anesthesia type: MAC  Patient location: PACU  Post pain: Pain level controlled  Post assessment: Post-op Vital signs reviewed  Last Vitals:  Filed Vitals:   11/06/12 1330  BP: 93/55  Pulse: 66  Temp: 37 C  Resp: 17    Post vital signs: Reviewed  Level of consciousness: sedated  Complications: No apparent anesthesia complications

## 2012-11-06 NOTE — Anesthesia Preprocedure Evaluation (Signed)
Anesthesia Evaluation  Patient identified by MRN, date of birth, ID band Patient awake    Reviewed: Allergy & Precautions, H&P , NPO status , Patient's Chart, lab work & pertinent test results, reviewed documented beta blocker date and time   History of Anesthesia Complications Negative for: history of anesthetic complications  Airway Mallampati: I TM Distance: >3 FB Neck ROM: full    Dental  (+) Teeth Intact   Pulmonary neg pulmonary ROS,  breath sounds clear to auscultation  Pulmonary exam normal       Cardiovascular Exercise Tolerance: Good negative cardio ROS  Rhythm:regular Rate:Normal     Neuro/Psych Low back pain  negative psych ROS   GI/Hepatic Neg liver ROS, GERD-  Medicated,  Endo/Other  negative endocrine ROS  Renal/GU negative Renal ROS Bladder dysfunction Female GU complaint     Musculoskeletal   Abdominal   Peds  Hematology negative hematology ROS (+)   Anesthesia Other Findings   Reproductive/Obstetrics negative OB ROS                           Anesthesia Physical Anesthesia Plan  ASA: II  Anesthesia Plan: Spinal   Post-op Pain Management:    Induction:   Airway Management Planned:   Additional Equipment:   Intra-op Plan:   Post-operative Plan:   Informed Consent: I have reviewed the patients History and Physical, chart, labs and discussed the procedure including the risks, benefits and alternatives for the proposed anesthesia with the patient or authorized representative who has indicated his/her understanding and acceptance.   Dental Advisory Given  Plan Discussed with: CRNA and Surgeon  Anesthesia Plan Comments: (GA with LMA vs Spinal vs MAC/Local discussed.  Pt wants least anesthesia possible.  Prefers spinal.  Will discuss with Dr Estanislado Pandy.  )        Anesthesia Quick Evaluation

## 2012-11-06 NOTE — H&P (View-Only) (Signed)
Reason for admission:  Postmenopausal bleeding with suspicion of endometrial polyp  HPI  This is a 67 year old Asian woman, G0, who was referred in July 2013 for excessive vaginal discharge which was due to severe vulvo-vaginal atrophy. Estrace cream was prescribed with good results after 3 months but patient desired to change to Estring for convenience. Femring was filled instead which patient used unopposed for 6 months with minimal spotting at initiation and breast soreness for 2 weeks, both now resolved. At annual visit an ultrasound was ordered to evaluate endometrium and revealed: uterus measuring 5.6 x 6.6 x 5.2 cm and 2 normal ovaries. There were 6 measurable fibroids with the largest at 4.4 cm and most between 1-2.5 cm. The endometrium was thickened at 17.26 mm with high suspicion of endometrial polyp. Recommendation was to proceed with hysteroscopy and resection of polyp.    Allergies  PENICILLINS: Rash SULFA (SULFONAMIDE ANTIBIOTICS): Rash  Medications    Name Date Source  Calcium 500 + D Take one tablet every day 09/25/12 Jeannie Martin  Estring 1 vaginal ring every 3 months 09/25/12 SR  multivitamin tablet take one tablet every day 09/25/12 Jeannie Martin  omega 3-dha-epa-fish oil 1,000 mg (120 mg-180 mg) capsule 09/25/12 Jeannie Martin  omeprazole 20 mg capsule,delayed release Take 1 capsule(s) every day by oral route. 09/25/12 Jeannie Martin   GYN History  Last Pap Smear: 09/25/12: normal Recent Bone Density: 01/04/2012 T Score -1.1. Recent Mammogram: 01/04/2012 Normal.  Past Medical History Non-contributory  Social History  Former smoker.(1 PPD) 10 YEARS.intake Moderate Wine/beer Moderate coffee/green tea Status: Married.  Family History Mother - Heart disease (died age: 94)   - Osteoporosis  Father - Carcinoma of prostate (died age: 90)  Sister - Osteoporosis  Maternal Aunt - Osteoporosis  Maternal Grandmother - Osteoporosis   - Heart disease   Brother - Gout   - Carcinoma of prostate  Maternal Grandfather - Alzheimer's disease   Surgical History Reviewed Surgical History   Surgery-Other - Wisdom Teeth   GI-Hernia Repair - 1994  Vitals 09/25/2012 02:33 pm Wt: 109 lbs  Ht: 5 ft 5 in BMI: 18.1  BP: 96/60       ROS Constitutional: Constitutional: no fever, fatigue, or significant weight loss/gain. Cardiovascular: Cardiovascular: no palpitations, SOB, orthopnea, edema, or chest pain. Gastrointestinal: Gastrointestinal: no heartburn, indigestion, nausea, vomiting, difficulty swallowing, abdominal pain, or bowel movement changes. Genitourinary: Genitourinary: no vaginal odor or itching and no hematuria, incontinence, rash, lesion, discharge, abnormal bleeding, flank pain, or trouble urinating. Endocrine: Endocrine: no endocrine symptoms. Menstrual cycle: no PMDD symptoms. Menopausal: no menopausal symptoms. Sexual problems: no sexual problems. Psychological: Psychiatric: no depression or alcoholism.  Physical Exam  Patient is a 67-year-old female.  Chaperone: Chaperone: present. Constitutional: General Appearance: healthy-appearing, well-nourished, and well-developed. Psychiatric: Orientation: to time, place, and person. Mood and Affect: normal mood and affect and active and alert. Skin: Appearance: no rashes or lesions. Neck: Neck: supple, FROM, trachea midline, and no masses. Thyroid: no enlargement or nodules and non-tender. Lungs: Respiratory Effort: no intercostal retractions or accessory muscle usage. Auscultation: no wheezing, rales/crackles, or rhonchi and clear to auscultation. Cardiovascular: Auscultation: RRR and no murmur. Peripheral Vascular: no varicosities, LLE edema, RLE edema, calf tenderness, or palpable cords and pedal pulses intact. Abdomen: Auscultation/Inspection/Palpation: no tenderness, hepatomegaly, splenomegaly, masses, or CVA tenderness and soft, non-distended, and normal bowel sounds. Hernia: none  palpated. Breast: Inspection/Palpation: no tenderness, skin changes, abnormal secretions, or distinct masses and nipple appearance normal. Female Genitalia: Vulva: no masses,   atrophy, or lesions. Vagina: no tenderness, erythema, rectocele, abnormal vaginal discharge, or vesicle(s) or ulcers and cystocele (2/4); well vascularized mucosa without any signs of atrophy. Cervix: no discharge or cervical motion tenderness and grossly normal. Uterus: normal size and shape and midline, mobile, non-tender, and no uterine prolapse. Bladder/Urethra: no urethral discharge or mass and normal meatus, bladder non distended, and Urethra well supported. Adnexa/Parametria: no parametrial tenderness or mass and no adnexal tenderness or ovarian mass. Lymph Nodes: Palpation: non tender submandibular nodes, axillary nodes, or inguinal nodes. Rectal Exam: Rectum: normal perianal skin and sphincter tone and no hemorrhoids or masses.  Assessment / Plan  Thickened endometrium with suspicion of polyp Proceed with HSC / resection of polyp / D&C  HYSTEROSCOPY   The procedure was reviewed with patient with expected benefits. Risks including but not limited to bleeding, infection, uterine perforation with possible intra-abdominal organ damage and need to stay overnight +/- require exploratory laparoscopy were also reviewed. Pre-operative instructions were given to patient. Post-operative recovery and expectations were also discussed and all questions were answered.  

## 2012-11-06 NOTE — Op Note (Signed)
Preop diagnosis: endometrial polyp  Postop diagnosis: Intra-cavity pedunculated fibroid  Anesthesia: sedation and paracervical block  Anesthesiologist: Dr. Jean Rosenthal  Procedure: Hysteroscopy, myomectomy  and curettage  Surgeon: Dr. Dois Davenport Delquan Poucher  Procedure: After being informed of the planned procedure with possible complications including bleeding, infection and uterine perforation, informed consent was obtained and patient was taken to or #8.  She was given IV sedation anesthesia without complication. She was placed in a dorsal decubitus position, prepped and draped in the sterile fashion and a Foley catheter was inserted in her bladder. Pelvic exam reveals a retroverted uterus approximately 8 weeks in size with normal adnexa.  A weighted speculum is inserted in the vagina. The cervix was grasped with a tenaculum forcep placed on the anterior lip.We proceed with a paracervical block using 1% Nesacaine, 20 cc. Uterus is sounded at 6.5. The cervix is then easily dilated using Hegar dilator until # 33. This allows for easy placement of a diagnostic hysteroscope. With perfusion of Glycine at a maximum pressure of 90 mmHg, we are able to evaluate the entire uterine cavity.  Observation: we note a large pedunculated fibroid filling the whole uterine cavity with a stalk attached to the anterior wall. We are able to go around it and visualize both tubal ostia and a thin atrophic myometrium.  We proceed with the resection of this fibroid in multiple passes. Fragment are removed to send to pathology.We then removed our instrumentation. Using a sharp curette, we proceed with curettage of the endometrial cavity which returns a small amount of normal-appearing endometrium.  Instruments are then removed. Instrument and sponge count is complete x2. Estimated blood loss is minimal. Water deficit is 305 cc.  The procedure is very well tolerated by the patient who is taken to recovery room in a well and stable  condition.  Specimen: Fragments of fibroid and endometrial curettings sent to pathology.

## 2012-11-07 ENCOUNTER — Encounter (HOSPITAL_COMMUNITY): Payer: Self-pay | Admitting: Obstetrics and Gynecology

## 2012-11-22 ENCOUNTER — Other Ambulatory Visit: Payer: Self-pay | Admitting: Family Medicine

## 2012-11-24 NOTE — Telephone Encounter (Signed)
Rx sent in to pharmacy. 

## 2012-11-28 ENCOUNTER — Ambulatory Visit (INDEPENDENT_AMBULATORY_CARE_PROVIDER_SITE_OTHER): Payer: Medicare Other | Admitting: Nurse Practitioner

## 2012-11-28 ENCOUNTER — Encounter: Payer: Self-pay | Admitting: Nurse Practitioner

## 2012-11-28 VITALS — BP 90/50 | HR 74 | Temp 97.6°F

## 2012-11-28 DIAGNOSIS — E785 Hyperlipidemia, unspecified: Secondary | ICD-10-CM | POA: Diagnosis not present

## 2012-11-28 DIAGNOSIS — E559 Vitamin D deficiency, unspecified: Secondary | ICD-10-CM

## 2012-11-28 DIAGNOSIS — K219 Gastro-esophageal reflux disease without esophagitis: Secondary | ICD-10-CM

## 2012-11-28 DIAGNOSIS — Z79899 Other long term (current) drug therapy: Secondary | ICD-10-CM | POA: Diagnosis not present

## 2012-11-28 LAB — LIPID PANEL
Total CHOL/HDL Ratio: 3
Triglycerides: 69 mg/dL (ref 0.0–149.0)
VLDL: 13.8 mg/dL (ref 0.0–40.0)

## 2012-11-28 LAB — MAGNESIUM: Magnesium: 2.2 mg/dL (ref 1.5–2.5)

## 2012-11-28 LAB — RENAL FUNCTION PANEL
CO2: 29 mEq/L (ref 19–32)
Chloride: 103 mEq/L (ref 96–112)
Creatinine, Ser: 0.6 mg/dL (ref 0.4–1.2)
GFR: 117.15 mL/min (ref >60.00)
Phosphorus: 3.6 mg/dL (ref 2.3–4.6)
Sodium: 138 mEq/L (ref 135–145)

## 2012-11-28 MED ORDER — OMEPRAZOLE 20 MG PO CPDR: 20.0000 mg | DELAYED_RELEASE_CAPSULE | Freq: Every day | ORAL | Status: DC

## 2012-11-28 NOTE — Patient Instructions (Signed)
I will call you with abnormal labs. I will be interested to see your records from sports medicine to assess your osteoporosis. Your goal should be to get at least 1200 mg Calcium in food. You will need to start Vit D supplements, but I will check level, then advise. Continue to see gynecology for all gynecology needs. Start 250 mg magnesium in the evening. If it causes loose stools, cut in half. Pleasure to meet you today!

## 2012-11-28 NOTE — Progress Notes (Signed)
Subjective:     Kelsey [name] is a 67 y.o. female and is here for a comprehensive physical exam. The patient reports recent surgery for uterine fibroid 3 weeks ago, ongoing treatment for osteoporosis by Sports med in [place]-not certain if she is on Prolia or Reclas; ongoing lumbar pain relieved with exercise; some joint swelling R ring finger.Marland Kitchen  History   Social History  . Marital Status: Married    Spouse Name: N/A    Number of Children: N/A  . Years of Education: N/A   Occupational History  . Not on file.   Social History Main Topics  . Smoking status: Former Smoker -- 0.30 packs/day for 10 years    Types: Cigarettes    Quit date: 07/09/1978  . Smokeless tobacco: Never Used  . Alcohol Use: Yes     Comment: 1-2 glasses of wine daily and a 12 oz beer daily  . Drug Use: No  . Sexually Active: No   Other Topics Concern  . Not on file   Social History Narrative  . No narrative on file   Health Maintenance  Topic Date Due  . Tetanus/tdap  10/31/1964  . Colonoscopy  11/01/1995  . Zostavax  10/31/2005  . Pneumococcal Polysaccharide Vaccine Age 46 And Over  11/01/2010  . Influenza Vaccine  03/09/2013  . Mammogram  01/03/2014    The following portions of the patient's history were reviewed and updated as appropriate: allergies, current medications, past family history, past medical history, past social history, past surgical history and problem list.  Review of Systems A comprehensive review of systems was negative except for: Gastrointestinal: positive for hX of chest pain that was Dx'd as GERD, symptom relief w/omeprazole. Genitourinary: positive for Hx of vaginal atrophy relieved by e string, recent Tx for large uterine fibroid. Musculoskeletal: positive for back pain and recent swelling of PIP R ring finger, recently noticed swelling of R sternoclavicular notch, no pain associated.   Objective:    BP 90/50  Pulse 74  Temp(Src) 97.6 F (36.4 C) (Oral)  SpO2  98% General appearance: alert, cooperative, appears stated age and no distress Head: Normocephalic, without obvious abnormality, atraumatic Eyes: negative findings: lids and lashes normal, conjunctivae and sclerae normal, corneas clear and pupils equal, round, reactive to light and accomodation Ears: normal TM's and external ear canals both ears Nose: no sinus tenderness Throat: lips, mucosa, and tongue normal; teeth and gums normal Neck: no adenopathy, no carotid bruit, no JVD, supple, symmetrical, trachea midline and thyroid not enlarged, symmetric, no tenderness/mass/nodules Back: no skin lesions, erythema, or scars, no tenderness to percussion or palpation, hyperflexion-able to put hands on floor several inches in front of her Lungs: clear to auscultation bilaterally Breasts: No axillary or supraclavicular adenopathy, did not examine breast tissue-only lymph node exam Heart: regular rate and rhythm, S1, S2 normal, no murmur, click, rub or gallop Abdomen: soft, non-tender; bowel sounds normal; no masses,  no organomegaly Extremities: extremities normal, atraumatic, no cyanosis or edema Pulses: 2+ and symmetric Skin: Skin color, texture, turgor normal. No rashes or lesions Lymph nodes: Cervical, supraclavicular, and axillary nodes normal.    Assessment:   Unspecified vitamin D deficiency - Plan: Vitamin D 25 hydroxy  Hyperlipidemia - Plan: Lipid panel  Medication management - Plan: Renal Function Panel, Magnesium  GERD (gastroesophageal reflux disease) - Plan: omeprazole (PRILOSEC) 20 MG capsule    Plan:     1. Unspecified vitamin D deficiency Last level was 32. Would like for level  to be at least 60. Will make reommendations after labs back - Vitamin D 25 hydroxy  2. Hyperlipidemia Mild elevation of LDL, HDL very high. Continue omega 3 and weekly fish intake. - Lipid panel  3. Medication management Pt is uncertain if she is taking prolia or reclast. Will request records  from provider administering med. Monitoring should include DEXA, Mg, Ca, Ph, kidney function, vit D level must be therapeutic. - Renal Function Panel - Magnesium  4. GERD (gastroesophageal reflux disease) Continue med. Add 250 mg Mg daily as PPI deplete magnesium. - omeprazole (PRILOSEC) 20 MG capsule; Take 1 capsule (20 mg total) by mouth daily.  Dispense: 90 capsule; Refill: 1  See After Visit Summary for Counseling Recommendations

## 2012-12-03 ENCOUNTER — Telehealth: Payer: Self-pay | Admitting: Nurse Practitioner

## 2012-12-03 DIAGNOSIS — E559 Vitamin D deficiency, unspecified: Secondary | ICD-10-CM | POA: Insufficient documentation

## 2012-12-03 MED ORDER — VITAMIN D3 1.25 MG (50000 UT) PO CAPS: 1.0000 | ORAL_CAPSULE | ORAL | Status: DC

## 2012-12-03 NOTE — Telephone Encounter (Signed)
See telephone note.

## 2012-12-15 ENCOUNTER — Encounter: Payer: Self-pay | Admitting: *Deleted

## 2012-12-15 LAB — BASIC METABOLIC PANEL
BUN: 9 mg/dL (ref 4–21)
CO2: 33 mmol/L
Calcium: 9.5 mg/dL
Chloride: 102 mmol/L
GFR, Est Non African American: 91
Glucose: 75

## 2012-12-15 LAB — VITAMIN D 25 HYDROXY (VIT D DEFICIENCY, FRACTURES): Vit D, 25-Hydroxy: 38

## 2012-12-19 ENCOUNTER — Telehealth: Payer: Self-pay | Admitting: Emergency Medicine

## 2012-12-19 DIAGNOSIS — E559 Vitamin D deficiency, unspecified: Secondary | ICD-10-CM

## 2012-12-19 DIAGNOSIS — Z1239 Encounter for other screening for malignant neoplasm of breast: Secondary | ICD-10-CM

## 2012-12-19 DIAGNOSIS — M810 Age-related osteoporosis without current pathological fracture: Secondary | ICD-10-CM

## 2012-12-19 NOTE — Telephone Encounter (Signed)
LM for patient to CB. [facility] is the best location for patient's [employer]

## 2012-12-19 NOTE — Telephone Encounter (Signed)
Patient states its time for her Bone Desity and Mammogram test, she wants to know if where she should set these up at and if [facility] will ok the order. Please advise patient

## 2012-12-22 NOTE — Telephone Encounter (Signed)
Patient will be going to [facility] 1st week of July. Faxed signed orders.

## 2012-12-31 ENCOUNTER — Encounter: Payer: Medicare Other | Admitting: Family Medicine

## 2013-01-06 DIAGNOSIS — Z1231 Encounter for screening mammogram for malignant neoplasm of breast: Secondary | ICD-10-CM | POA: Diagnosis not present

## 2013-01-07 ENCOUNTER — Encounter: Payer: Self-pay | Admitting: Nurse Practitioner

## 2013-01-12 ENCOUNTER — Telehealth: Payer: Self-pay | Admitting: *Deleted

## 2013-01-12 NOTE — Telephone Encounter (Signed)
Tried patient again but there was know answer.

## 2013-01-12 NOTE — Telephone Encounter (Signed)
Need patient's name and DOB. Thanks! Marcelino Duster  From: Maximino Sarin, FNP Sent: 01/08/2013 8:34 AM To: April Paul Half, CMA Subject: call pt   Please call pt. Advise we have sent letter of necessity to [facility] for bone density scan. She can check with them next week if she does not hear from them. They should let her know if the scan is approved through insurance. Thanks!

## 2013-01-13 ENCOUNTER — Ambulatory Visit: Payer: Medicare Other

## 2013-01-15 NOTE — Telephone Encounter (Signed)
Tried to call patient again,still no answer and know way to leave a message. Home number is know longer a service.

## 2013-01-20 ENCOUNTER — Encounter: Payer: Self-pay | Admitting: *Deleted

## 2013-01-28 ENCOUNTER — Encounter: Payer: Self-pay | Admitting: Family Medicine

## 2013-04-30 ENCOUNTER — Other Ambulatory Visit: Payer: Self-pay | Admitting: Emergency Medicine

## 2013-04-30 ENCOUNTER — Other Ambulatory Visit: Payer: Self-pay | Admitting: Nurse Practitioner

## 2013-04-30 ENCOUNTER — Other Ambulatory Visit (INDEPENDENT_AMBULATORY_CARE_PROVIDER_SITE_OTHER): Payer: Medicare Other

## 2013-04-30 DIAGNOSIS — E559 Vitamin D deficiency, unspecified: Secondary | ICD-10-CM

## 2013-04-30 NOTE — Telephone Encounter (Signed)
Please call pt, ask her to come in for lab appt for vit D level. Please schedule.

## 2013-04-30 NOTE — Progress Notes (Signed)
Labs only

## 2013-05-01 ENCOUNTER — Telehealth: Payer: Self-pay | Admitting: Nurse Practitioner

## 2013-05-01 NOTE — Telephone Encounter (Signed)
Pls call pt: advise to take 2000 iu daily Vitamin D3. Continue weight bearing exercise.

## 2013-05-01 NOTE — Telephone Encounter (Signed)
Contacted patient and updated demographics in EPIC

## 2013-05-01 NOTE — Telephone Encounter (Signed)
Unable to reach pt by phone. Will request letter to be sent for updated contact info. Vit D still not in therapeutic range ( 50-80). Recmd continue 2000 iu daily Vit D3.

## 2013-05-04 NOTE — Telephone Encounter (Signed)
LMOVM for patient to return call concerning results.  

## 2013-05-04 NOTE — Telephone Encounter (Signed)
Patient returned call and was given advise. 

## 2013-05-27 ENCOUNTER — Telehealth: Payer: Self-pay | Admitting: *Deleted

## 2013-05-27 NOTE — Telephone Encounter (Signed)
Message copied by Baldwin [place] on Wed May 27, 2013 10:20 AM ------      Message from: Carmelia Bake      Created: Wed May 27, 2013  9:27 AM      Contact: [phone]       Please call patient re Proalia injection. She wants to come in ASAP ------

## 2013-06-11 ENCOUNTER — Telehealth: Payer: Self-pay | Admitting: Nurse Practitioner

## 2013-06-11 NOTE — Telephone Encounter (Signed)
I Do not know why prolia has been pre-auth. I have not ordered this med.  Pt received 2 doses from a historical provider. This medication carries potential for serious adverse events. I do not recommend it for this pt as she has used reclast only once- 10/'10. She should use Reclast yearly for at least 3 yrs before treatment failure is determined.  Pt has had chronically low vit d, which must be corrected for maximal calcium absorption which must take place before giving bisphosphonates to decrease risk for bone necrosis.  Once vit D level is b/w 50-80, I will order reclast if pt is insistent. But she has not had Dexa since 2013, so we do not know if 2 doses of prolia were effective. I have LM for pt to return call.

## 2013-06-12 ENCOUNTER — Other Ambulatory Visit: Payer: Self-pay | Admitting: Nurse Practitioner

## 2013-06-12 ENCOUNTER — Ambulatory Visit: Payer: Medicare Other

## 2013-06-12 DIAGNOSIS — M810 Age-related osteoporosis without current pathological fracture: Secondary | ICD-10-CM

## 2013-06-26 ENCOUNTER — Telehealth: Payer: Self-pay | Admitting: Nurse Practitioner

## 2013-06-26 NOTE — Telephone Encounter (Signed)
Bone density scheduled for 06/30/13 @ 10:15 ([facility] [place]), pt notified of appointment time  Orders to be signed and faxed to [phone]

## 2013-06-30 DIAGNOSIS — M810 Age-related osteoporosis without current pathological fracture: Secondary | ICD-10-CM | POA: Diagnosis not present

## 2013-07-03 ENCOUNTER — Telehealth: Payer: Self-pay | Admitting: Nurse Practitioner

## 2013-07-03 NOTE — Telephone Encounter (Signed)
Improvement at spine: increase 1.5% since last scan 6/13. T-score -1.0. Decline R femur of 1.3%. t score -2.8.  Recmd cont. Weight bearing exercise, ca, vit  Supplement. Reclast q1y for 3 yrs. If wishes.  LM for pt.

## 2013-07-15 ENCOUNTER — Other Ambulatory Visit: Payer: Self-pay | Admitting: *Deleted

## 2013-07-15 MED ORDER — OMEPRAZOLE 20 MG PO CPDR: DELAYED_RELEASE_CAPSULE | ORAL | Status: DC

## 2013-07-15 NOTE — Telephone Encounter (Signed)
Called oncology department and had to leave message. Waiting for a call back.

## 2013-07-17 NOTE — Telephone Encounter (Signed)
Attempting to set reclast infusion at [facility] long

## 2013-07-21 NOTE — Telephone Encounter (Signed)
[name] does not do Reclast infusions, however they advised me to contact Short Stay at [facility] to see if they do the infusions. Called short stay and they stated that they do give the Reclast infusions. Short stay faxed over information.

## 2013-07-27 ENCOUNTER — Encounter: Payer: Self-pay | Admitting: Family Medicine

## 2013-08-07 ENCOUNTER — Telehealth: Payer: Self-pay | Admitting: Nurse Practitioner

## 2013-08-07 ENCOUNTER — Other Ambulatory Visit: Payer: Self-pay | Admitting: Nurse Practitioner

## 2013-08-07 DIAGNOSIS — M810 Age-related osteoporosis without current pathological fracture: Secondary | ICD-10-CM

## 2013-08-07 NOTE — Telephone Encounter (Signed)
Please call patient. She has a question about her Prolia injection.

## 2013-08-07 NOTE — Telephone Encounter (Signed)
Patient was calling in inquire about reclast inj appt. Called Short Stay who stated that pt will need to come in and get labs drawn, fax form and lab results to [facility]. After we fax form and labs, we need to call Short Stay to make appt for pt. Patient stated that she will come in for labs next week.

## 2013-08-11 ENCOUNTER — Other Ambulatory Visit (INDEPENDENT_AMBULATORY_CARE_PROVIDER_SITE_OTHER): Payer: Medicare Other

## 2013-08-11 DIAGNOSIS — M810 Age-related osteoporosis without current pathological fracture: Secondary | ICD-10-CM | POA: Diagnosis not present

## 2013-08-11 LAB — CALCIUM: CALCIUM: 8.9 mg/dL (ref 8.4–10.5)

## 2013-08-11 LAB — CREATININE, SERUM: Creatinine, Ser: 0.6 mg/dL (ref 0.4–1.2)

## 2013-08-14 ENCOUNTER — Telehealth: Payer: Self-pay | Admitting: *Deleted

## 2013-08-14 NOTE — Telephone Encounter (Signed)
Reclast inj form and labs faxed to [facility]. Appointment scheduled for pt to have Reclast inj 09/01/2013 at 1:00 pm. Patient informed of appointment and advised to drink 2 glasses of water a few hours before appointment.

## 2013-08-14 NOTE — Telephone Encounter (Signed)
Message copied by Julieta Bellini on Fri Aug 14, 2013  9:12 AM ------      Message from: WEAVER, LAYNE COX      Created: Wed Aug 12, 2013  4:33 PM       Please add these labs results to paper work for reclast order for short stay. ------

## 2013-08-27 DIAGNOSIS — Z1211 Encounter for screening for malignant neoplasm of colon: Secondary | ICD-10-CM | POA: Diagnosis not present

## 2013-08-27 DIAGNOSIS — K219 Gastro-esophageal reflux disease without esophagitis: Secondary | ICD-10-CM | POA: Diagnosis not present

## 2013-09-01 ENCOUNTER — Encounter (HOSPITAL_COMMUNITY): Payer: Self-pay

## 2013-09-01 ENCOUNTER — Other Ambulatory Visit (HOSPITAL_COMMUNITY): Payer: Self-pay | Admitting: Nurse Practitioner

## 2013-09-01 ENCOUNTER — Ambulatory Visit (HOSPITAL_COMMUNITY)
Admission: RE | Admit: 2013-09-01 | Discharge: 2013-09-01 | Disposition: A | Discharge status: Home / Self Care | Payer: Medicare Other | Source: Ambulatory Visit | Attending: Nurse Practitioner | Admitting: Nurse Practitioner

## 2013-09-01 DIAGNOSIS — M810 Age-related osteoporosis without current pathological fracture: Secondary | ICD-10-CM | POA: Insufficient documentation

## 2013-09-01 MED ORDER — SODIUM CHLORIDE 0.9 % IV SOLN
Freq: Once | INTRAVENOUS | Status: AC
  Administered 2013-09-01: 13:00:00 via INTRAVENOUS

## 2013-09-01 MED ORDER — ZOLEDRONIC ACID 5 MG/100ML IV SOLN
5.0000 mg | Freq: Once | INTRAVENOUS | Status: AC
  Administered 2013-09-01: 5 mg via INTRAVENOUS
  Filled 2013-09-01: qty 100

## 2013-09-01 NOTE — Discharge Instructions (Signed)

## 2013-10-07 DIAGNOSIS — N952 Postmenopausal atrophic vaginitis: Secondary | ICD-10-CM | POA: Diagnosis not present

## 2013-10-07 DIAGNOSIS — Z124 Encounter for screening for malignant neoplasm of cervix: Secondary | ICD-10-CM | POA: Diagnosis not present

## 2013-10-07 DIAGNOSIS — M810 Age-related osteoporosis without current pathological fracture: Secondary | ICD-10-CM | POA: Diagnosis not present

## 2013-10-13 DIAGNOSIS — H2510 Age-related nuclear cataract, unspecified eye: Secondary | ICD-10-CM | POA: Diagnosis not present

## 2013-12-02 ENCOUNTER — Encounter: Payer: Self-pay | Admitting: Nurse Practitioner

## 2013-12-02 ENCOUNTER — Ambulatory Visit (INDEPENDENT_AMBULATORY_CARE_PROVIDER_SITE_OTHER): Payer: Medicare Other | Admitting: Nurse Practitioner

## 2013-12-02 VITALS — BP 85/52 | HR 71 | Temp 97.8°F | Ht 65.0 in | Wt 108.0 lb

## 2013-12-02 DIAGNOSIS — E785 Hyperlipidemia, unspecified: Secondary | ICD-10-CM

## 2013-12-02 DIAGNOSIS — Z6379 Other stressful life events affecting family and household: Secondary | ICD-10-CM

## 2013-12-02 DIAGNOSIS — R252 Cramp and spasm: Secondary | ICD-10-CM

## 2013-12-02 DIAGNOSIS — R636 Underweight: Secondary | ICD-10-CM | POA: Insufficient documentation

## 2013-12-02 DIAGNOSIS — Z79899 Other long term (current) drug therapy: Secondary | ICD-10-CM

## 2013-12-02 DIAGNOSIS — M810 Age-related osteoporosis without current pathological fracture: Secondary | ICD-10-CM

## 2013-12-02 DIAGNOSIS — Z636 Dependent relative needing care at home: Secondary | ICD-10-CM

## 2013-12-02 DIAGNOSIS — Z0000 Encounter for general adult medical examination without abnormal findings: Secondary | ICD-10-CM

## 2013-12-02 DIAGNOSIS — Z862 Personal history of diseases of the blood and blood-forming organs and certain disorders involving the immune mechanism: Secondary | ICD-10-CM | POA: Diagnosis not present

## 2013-12-02 DIAGNOSIS — Z23 Encounter for immunization: Secondary | ICD-10-CM

## 2013-12-02 DIAGNOSIS — E559 Vitamin D deficiency, unspecified: Secondary | ICD-10-CM

## 2013-12-02 HISTORY — DX: Personal history of diseases of the blood and blood-forming organs and certain disorders involving the immune mechanism: Z86.2

## 2013-12-02 LAB — CBC WITH DIFFERENTIAL/PLATELET
BASOS ABS: 0 10*3/uL (ref 0.0–0.1)
Basophils Relative: 0.9 % (ref 0.0–3.0)
EOS ABS: 0.1 10*3/uL (ref 0.0–0.7)
Eosinophils Relative: 2.2 % (ref 0.0–5.0)
HCT: 38 % (ref 36.0–46.0)
Hemoglobin: 12.6 g/dL (ref 12.0–15.0)
LYMPHS PCT: 27.5 % (ref 12.0–46.0)
Lymphs Abs: 1.2 10*3/uL (ref 0.7–4.0)
MCHC: 33.1 g/dL (ref 30.0–36.0)
MCV: 95.6 fl (ref 78.0–100.0)
Monocytes Absolute: 0.4 10*3/uL (ref 0.1–1.0)
Monocytes Relative: 7.8 % (ref 3.0–12.0)
NEUTROS PCT: 61.6 % (ref 43.0–77.0)
Neutro Abs: 2.8 10*3/uL (ref 1.4–7.7)
Platelets: 244 10*3/uL (ref 150.0–400.0)
RBC: 3.98 Mil/uL (ref 3.87–5.11)
RDW: 14 % (ref 11.5–15.5)
WBC: 4.5 10*3/uL (ref 4.0–10.5)

## 2013-12-02 LAB — COMPREHENSIVE METABOLIC PANEL
ALBUMIN: 4.3 g/dL (ref 3.5–5.2)
ALK PHOS: 51 U/L (ref 39–117)
ALT: 22 U/L (ref 0–35)
AST: 24 U/L (ref 0–37)
BILIRUBIN TOTAL: 1 mg/dL (ref 0.2–1.2)
BUN: 15 mg/dL (ref 6–23)
CO2: 30 mEq/L (ref 19–32)
Calcium: 9.5 mg/dL (ref 8.4–10.5)
Chloride: 102 mEq/L (ref 96–112)
Creatinine, Ser: 0.7 mg/dL (ref 0.4–1.2)
GFR: 96.32 mL/min (ref >60.00)
GLUCOSE: 78 mg/dL (ref 70–99)
Potassium: 4.9 mEq/L (ref 3.5–5.1)
Sodium: 140 mEq/L (ref 135–145)
Total Protein: 7 g/dL (ref 6.0–8.3)

## 2013-12-02 LAB — VITAMIN B12: VITAMIN B 12: 726 pg/mL (ref 211–911)

## 2013-12-02 LAB — IRON AND TIBC
%SAT: 30 % (ref 20–55)
IRON: 123 ug/dL (ref 42–145)
TIBC: 406 ug/dL (ref 250–470)
UIBC: 283 ug/dL (ref 125–400)

## 2013-12-02 LAB — LIPID PANEL
Cholesterol: 273 mg/dL — ABNORMAL HIGH (ref 0–200)
HDL: 110.6 mg/dL (ref >39.00)
LDL Cholesterol: 154 mg/dL — ABNORMAL HIGH (ref 0–99)
Total CHOL/HDL Ratio: 2
Triglycerides: 40 mg/dL (ref 0.0–149.0)
VLDL: 8 mg/dL (ref 0.0–40.0)

## 2013-12-02 LAB — MAGNESIUM: Magnesium: 2.2 mg/dL (ref 1.5–2.5)

## 2013-12-02 LAB — TSH: TSH: 1.02 u[IU]/mL (ref 0.35–4.50)

## 2013-12-02 NOTE — Assessment & Plan Note (Signed)
Goal: keep level between 50-80. Continue D3 daily. Check level today.

## 2013-12-02 NOTE — Assessment & Plan Note (Signed)
Reclast infusion 2/15. (Q2Y) Will repeat DEXA 12/16. Will plan another reclast 2/17 if no improvement & pt wishes. Continue Vit D supplement. Check level today. Decrease animal fats & proteins to minimize calcium loss. Get calcium through dark green vegetables.

## 2013-12-02 NOTE — Assessment & Plan Note (Signed)
BMI 17.  Historical thyroid studies nml. Check TSH today.

## 2013-12-02 NOTE — Assessment & Plan Note (Signed)
HDL is high-cardioprotective. Check lipids today. Exercises regularly.

## 2013-12-02 NOTE — Patient Instructions (Signed)
Develop lifelong habits of exercise most days of the week: take a 30 minute walk. The benefits include weight loss, lower risk for heart disease, diabetes, stroke, high blood pressure, lower rates of depression & dementia, better sleep quality & bone health.  Choose nutrient dense foods:seeds, nuts, fruits & vegetables, whole grains, brown rice.  Continue with vitamin D. My office will call with lab results. Keep appointment with colonoscopy. Get shingles vaccine at least 2 weeks after the vaccines you had today.  Herpes Zoster Virus Vaccine What is this medicine? HERPES ZOSTER VIRUS VACCINE (HUR peez ZOS ter vahy ruhs vak SEEN) is a vaccine. It is used to prevent shingles in adults 68 years old and over. This vaccine is not used to treat shingles or nerve pain from shingles. This medicine may be used for other purposes; ask your health care provider or pharmacist if you have questions. COMMON BRAND NAME(S): Zostavax What should I tell my health care provider before I take this medicine? They need to know if you have any of these conditions: -cancer like leukemia or lymphoma -immune system problems or therapy -infection with fever -tuberculosis -an unusual or allergic reaction to vaccines, neomycin, gelatin, other medicines, foods, dyes, or preservatives -pregnant or trying to get pregnant -breast-feeding How should I use this medicine? This vaccine is for injection under the skin. It is given by a health care professional. Talk to your pediatrician regarding the use of this medicine in children. This medicine is not approved for use in children. Overdosage: If you think you have taken too much of this medicine contact a poison control center or emergency room at once. NOTE: This medicine is only for you. Do not share this medicine with others. What if I miss a dose? This does not apply. What may interact with this medicine? Do not take this medicine with any of the following  medications: -adalimumab -anakinra -etanercept -infliximab -medicines to treat cancer -medicines that suppress your immune system This medicine may also interact with the following medications: -immunoglobulins -steroid medicines like prednisone or cortisone This list may not describe all possible interactions. Give your health care provider a list of all the medicines, herbs, non-prescription drugs, or dietary supplements you use. Also tell them if you smoke, drink alcohol, or use illegal drugs. Some items may interact with your medicine. What should I watch for while using this medicine? Visit your doctor for regular check ups. This vaccine, like all vaccines, may not fully protect everyone. After receiving this vaccine it may be possible to pass chickenpox infection to others. Avoid people with immune system problems, pregnant women who have not had chickenpox, and newborns of women who have not had chickenpox. Talk to your doctor for more information. What side effects may I notice from receiving this medicine? Side effects that you should report to your doctor or health care professional as soon as possible: -allergic reactions like skin rash, itching or hives, swelling of the face, lips, or tongue -breathing problems -feeling faint or lightheaded, falls -fever, flu-like symptoms -pain, tingling, numbness in the hands or feet -swelling of the ankles, feet, hands -unusually weak or tired Side effects that usually do not require medical attention (report to your doctor or health care professional if they continue or are bothersome): -aches or pains -chickenpox-like rash -diarrhea -headache -loss of appetite -nausea, vomiting -redness, pain, swelling at site where injected -runny nose This list may not describe all possible side effects. Call your doctor for medical advice about side  effects. You may report side effects to FDA at 1-800-FDA-1088. Where should I keep my  medicine? This drug is given in a hospital or clinic and will not be stored at home. NOTE: This sheet is a summary. It may not cover all possible information. If you have questions about this medicine, talk to your doctor, pharmacist, or health care provider.  2014, Elsevier/Gold Standard. (2009-12-12 17:43:50)

## 2013-12-02 NOTE — Progress Notes (Signed)
Pre visit review using our clinic review tool, if applicable. No additional management support is needed unless otherwise documented below in the visit note. 

## 2013-12-02 NOTE — Assessment & Plan Note (Signed)
Occuring in am, not associated w/exercise. Check Mg, CMET today.

## 2013-12-02 NOTE — Assessment & Plan Note (Signed)
Taking Caregiver class through [facility]. Feeling frustrated with housework and having to remind husband of things frequently.  Recently retired to spend more time at home. Planning to join support group this summer. Learning she must make time for self. Planning to travel to [place] next month.

## 2013-12-02 NOTE — Assessment & Plan Note (Addendum)
No fatigue or SOB. Check CBC & iron studies today.

## 2013-12-02 NOTE — Assessment & Plan Note (Signed)
Needs Tdap & shingles, & Pna 13 Colonoscopy due this yr-scheduled 7/'15 w/Dr Collene Mares. MMG scheduled 7/'15. Sees gyn for womancare.

## 2013-12-03 LAB — VITAMIN D 25 HYDROXY (VIT D DEFICIENCY, FRACTURES): VIT D 25 HYDROXY: 64 ng/mL (ref 30–89)

## 2013-12-03 LAB — HEPATITIS C ANTIBODY: HCV AB: NEGATIVE

## 2013-12-04 ENCOUNTER — Telehealth: Payer: Self-pay | Admitting: Nurse Practitioner

## 2013-12-04 NOTE — Telephone Encounter (Signed)
pls call pt: Advise  All labs look great.  Continue w/current Vit D.  No need for statin.  No anemia or iron deficiency.  All electrolytes nml, including Mg.  B12 normal.  Thyroid nml.  See her in 1 year unless she needs us.  

## 2013-12-10 ENCOUNTER — Other Ambulatory Visit: Payer: Self-pay

## 2013-12-11 ENCOUNTER — Other Ambulatory Visit: Payer: Self-pay | Admitting: *Deleted

## 2013-12-11 MED ORDER — OMEPRAZOLE 20 MG PO CPDR: 20.0000 mg | DELAYED_RELEASE_CAPSULE | Freq: Every day | ORAL | Status: DC

## 2013-12-16 NOTE — Telephone Encounter (Signed)
Left message for pt to return call concerning results. Pt is out of town for a few weeks.

## 2013-12-16 NOTE — Telephone Encounter (Signed)
pls call pt: Advise  All labs look great.  Continue w/current Vit D.  No need for statin.  No anemia or iron deficiency.  All electrolytes nml, including Mg.  B12 normal.  Thyroid nml.  See her in 1 year unless she needs [place].

## 2013-12-23 NOTE — Telephone Encounter (Signed)
LMOVM for pt to return call concerning results.

## 2013-12-25 NOTE — Telephone Encounter (Signed)
Called pt, LMOM to CB. 

## 2013-12-26 NOTE — Progress Notes (Signed)
Subjective:     Kelsey [name] is a 68 y.o. female who presents for f/u of osteoporosis, dyslipidemia, underweight, Vit D deficicency, History of iron deficiency and today she c/o nocturnal muscle cramps and is tearful as she discusses the strain of caring for husband who has dementia. OP: Reclast inj 6 mos ago. DEXA in 2 yrs. Taking vit D & ca supplements-has been D deficient. Will check today. Underweight: check thyroid, B12, CBC, CMET, screen Hep C. Lipids: HDL very high-decreases risk for ASCVD. Check lipids today. Pt gives HX of iron deficiency. Will check iron studies today. She c/o muscle cramps-mostly at night, occasionally during day. She is taking Mg supplement. Will check Mg, CBC, CMET today. She continues to take omeprazole for GERD. Symptoms well controlled. Continue med. Check B12 as it can become depleted while taking PPI.  The following portions of the patient's history were reviewed and updated as appropriate: allergies, current medications, past family history, past medical history, past social history, past surgical history and problem list.  Review of Systems Pertinent items are noted in HPI.    Objective:    BP 85/52  Pulse 71  Temp(Src) 97.8 F (36.6 C) (Temporal)  Ht 5\' 5"  (1.651 m)  Wt 108 lb (48.988 kg)  BMI 17.97 kg/m2  SpO2 96% BP 85/52  Pulse 71  Temp(Src) 97.8 F (36.6 C) (Temporal)  Ht 5\' 5"  (1.651 m)  Wt 108 lb (48.988 kg)  BMI 17.97 kg/m2  SpO2 96% General appearance: alert, cooperative, appears stated age and mild distress Head: Normocephalic, without obvious abnormality, atraumatic Eyes: negative findings: lids and lashes normal and conjunctivae and sclerae normal Lungs: clear to auscultation bilaterally Heart: regular rate and rhythm, S1, S2 normal, no murmur, click, rub or gallop Abdomen: soft, non-tender; bowel sounds normal; no masses,  no organomegaly Extremities: extremities normal, atraumatic, no cyanosis or edema Pulses: 2+ and  symmetric Lymph nodes: Cervical, supraclavicular, and axillary nodes normal.    Assessment:     2. History of iron deficiency anemia - CBC with Differential - Iron and TIBC  3. Muscle cramp - Comprehensive metabolic panel - Magnesium  4. Underweight - CBC with Differential - Hepatitis C antibody - Comprehensive metabolic panel - TSH  5. Dyslipidemia - Lipid panel  6. Need for pneumococcal vaccination - Pneumococcal conjugate vaccine 13-valent IM  7. Need for Tdap vaccination - Tdap vaccine greater than or equal to 7yo IM  8. Osteoporosis, unspecified   9. Hyperlipidemia   10. Unspecified vitamin D deficiency   11. Caregiver stress

## 2014-01-06 DIAGNOSIS — K319 Disease of stomach and duodenum, unspecified: Secondary | ICD-10-CM | POA: Diagnosis not present

## 2014-01-06 DIAGNOSIS — K2289 Other specified disease of esophagus: Secondary | ICD-10-CM | POA: Diagnosis not present

## 2014-01-06 DIAGNOSIS — K2970 Gastritis, unspecified, without bleeding: Secondary | ICD-10-CM | POA: Diagnosis not present

## 2014-01-06 DIAGNOSIS — K2990 Gastroduodenitis, unspecified, without bleeding: Secondary | ICD-10-CM | POA: Diagnosis not present

## 2014-01-06 DIAGNOSIS — Z1211 Encounter for screening for malignant neoplasm of colon: Secondary | ICD-10-CM | POA: Diagnosis not present

## 2014-01-06 DIAGNOSIS — K219 Gastro-esophageal reflux disease without esophagitis: Secondary | ICD-10-CM | POA: Diagnosis not present

## 2014-01-06 DIAGNOSIS — K228 Other specified diseases of esophagus: Secondary | ICD-10-CM | POA: Diagnosis not present

## 2014-01-06 DIAGNOSIS — R1013 Epigastric pain: Secondary | ICD-10-CM | POA: Diagnosis not present

## 2014-01-14 ENCOUNTER — Encounter: Payer: Self-pay | Admitting: Nurse Practitioner

## 2014-01-15 ENCOUNTER — Encounter: Payer: Self-pay | Admitting: Nurse Practitioner

## 2014-01-20 NOTE — Telephone Encounter (Signed)
Patient is aware of results.

## 2014-01-25 ENCOUNTER — Encounter: Payer: Self-pay | Admitting: Nurse Practitioner

## 2014-01-25 DIAGNOSIS — M2170 Unequal limb length (acquired), unspecified site: Secondary | ICD-10-CM | POA: Insufficient documentation

## 2014-02-11 ENCOUNTER — Ambulatory Visit: Payer: Medicare Other | Admitting: Nurse Practitioner

## 2014-02-11 ENCOUNTER — Encounter: Payer: Self-pay | Admitting: Nurse Practitioner

## 2014-02-11 ENCOUNTER — Ambulatory Visit (INDEPENDENT_AMBULATORY_CARE_PROVIDER_SITE_OTHER): Payer: Medicare Other | Admitting: Nurse Practitioner

## 2014-02-11 VITALS — BP 95/60 | HR 77 | Temp 97.8°F | Ht 65.0 in | Wt 108.0 lb

## 2014-02-11 DIAGNOSIS — W57XXXA Bitten or stung by nonvenomous insect and other nonvenomous arthropods, initial encounter: Secondary | ICD-10-CM | POA: Diagnosis not present

## 2014-02-11 DIAGNOSIS — T148 Other injury of unspecified body region: Secondary | ICD-10-CM | POA: Diagnosis not present

## 2014-02-11 MED ORDER — METHYLPREDNISOLONE ACETATE 40 MG/ML IJ SUSP
40.0000 mg | Freq: Once | INTRAMUSCULAR | Status: AC
  Administered 2014-02-11: 40 mg via INTRA_ARTICULAR

## 2014-02-11 NOTE — Progress Notes (Signed)
   Subjective:    Patient ID: Kelsey [name], female    DOB: [birth year], 68 y.o.   MRN: [id number]  Rash This is a new (Pulling weeds few weeks ago & this week. ) problem. The current episode started 1 to 4 weeks ago (2 weeks-rash on legs, yesterday rash on arms & trunk). The problem has been gradually worsening since onset. The rash is diffuse (arms, legs, trunk). The rash is characterized by blistering, itchiness, redness and swelling. She was exposed to plant contact and an insect bite/sting. Pertinent negatives include no anorexia, congestion, cough, diarrhea, eye pain, facial edema, fatigue, fever, joint pain, shortness of breath, sore throat or vomiting. Past treatments include nothing.      Review of Systems  Constitutional: Negative for fever, chills, activity change and fatigue.  HENT: Negative for congestion and sore throat.   Eyes: Negative for pain and redness.  Respiratory: Negative for cough, chest tightness and shortness of breath.   Gastrointestinal: Negative for vomiting, abdominal pain, diarrhea and anorexia.  Musculoskeletal: Negative for joint pain.  Skin: Positive for rash.  Neurological: Negative for headaches.  Hematological: Negative for adenopathy.       Objective:   Physical Exam  Vitals reviewed. Constitutional: She is oriented to person, place, and time. She appears well-developed and well-nourished.  HENT:  Head: Normocephalic and atraumatic.  Eyes: Conjunctivae are normal. Right eye exhibits no discharge. Left eye exhibits no discharge.  Cardiovascular: Normal rate.   Pulmonary/Chest: Effort normal and breath sounds normal. No respiratory distress.  Neurological: She is alert and oriented to person, place, and time.  Skin: Skin is warm and dry. Rash noted.  Few small bullous lesions on ankle & toe, crusting over. Multiple  (about 20) 1 mm insects w/6 legs removed from skin-trunk, legs, arms.  (Legs seen under microscope). Some lesions surrounded by  mild erythema, some have small bullous lesions w/no visible insect.  Psychiatric: She has a normal mood and affect. Her behavior is normal. Thought content normal.          Assessment & Plan:  1. Tick bite - methylPREDNISolone acetate (DEPO-MEDROL) injection 40 mg; Inject 1 mL (40 mg total) into the articular space once. Claritin, pepcid. Skin care instructions given. Monitor for tick fever for 21 days.  See pt instructions.

## 2014-02-11 NOTE — Progress Notes (Signed)
Pre visit review using our clinic review tool, if applicable. No additional management support is needed unless otherwise documented below in the visit note. 

## 2014-02-11 NOTE — Patient Instructions (Signed)
Start claritin 10 mg and pepcid 20 mg daily. Take for 10 days. Get exfoliating gloves. Wash with gentle soap-Dove bar soap. Apply good lotion -St Oleh Genin.- after washing. Monitor for symptoms of tick fever for 21 days: fever, headache, joint pain, rash.  Tick Bite Information Ticks are insects that attach themselves to the skin and draw blood for food. There are various types of ticks. Common types include wood ticks and deer ticks. Most ticks live in shrubs and grassy areas. Ticks can climb onto your body when you make contact with leaves or grass where the tick is waiting. The most common places on the body for ticks to attach themselves are the scalp, neck, armpits, waist, and groin. Most tick bites are harmless, but sometimes ticks carry germs that cause diseases. These germs can be spread to a person during the tick's feeding process. The chance of a disease spreading through a tick bite depends on:   The type of tick.  Time of year.   How long the tick is attached.   Geographic location.  HOW CAN YOU PREVENT TICK BITES? Take these steps to help prevent tick bites when you are outdoors:  Wear protective clothing. Long sleeves and long pants are best.   Wear white clothes so you can see ticks more easily.  Tuck your pant legs into your socks.   If walking on a trail, stay in the middle of the trail to avoid brushing against bushes.  Avoid walking through areas with long grass.  Put insect repellent on all exposed skin and along boot tops, pant legs, and sleeve cuffs.   Check clothing, hair, and skin repeatedly and before going inside.   Brush off any ticks that are not attached.  Take a shower or bath as soon as possible after being outdoors.  WHAT IS THE PROPER WAY TO REMOVE A TICK? Ticks should be removed as soon as possible to help prevent diseases caused by tick bites. 1. If latex gloves are available, put them on before trying to remove a tick.  2. Using  fine-point tweezers, grasp the tick as close to the skin as possible. You may also use curved forceps or a tick removal tool. Grasp the tick as close to its head as possible. Avoid grasping the tick on its body. 3. Pull gently with steady upward pressure until the tick lets go. Do not twist the tick or jerk it suddenly. This may break off the tick's head or mouth parts. 4. Do not squeeze or crush the tick's body. This could force disease-carrying fluids from the tick into your body.  5. After the tick is removed, wash the bite area and your hands with soap and water or other disinfectant such as alcohol. 6. Apply a small amount of antiseptic cream or ointment to the bite site.  7. Wash and disinfect any instruments that were used.  Do not try to remove a tick by applying a hot match, petroleum jelly, or fingernail polish to the tick. These methods do not work and may increase the chances of disease being spread from the tick bite.  WHEN SHOULD YOU SEEK MEDICAL CARE? Contact your health care provider if you are unable to remove a tick from your skin or if a part of the tick breaks off and is stuck in the skin.  After a tick bite, you need to be aware of signs and symptoms that could be related to diseases spread by ticks. Contact your health care provider  if you develop any of the following in the days or weeks after the tick bite:  Unexplained fever.  Rash. A circular rash that appears days or weeks after the tick bite may indicate the possibility of Lyme disease. The rash may resemble a target with a bull's-eye and may occur at a different part of your body than the tick bite.  Redness and swelling in the area of the tick bite.   Tender, swollen lymph glands.   Diarrhea.   Weight loss.   Cough.   Fatigue.   Muscle, joint, or bone pain.   Abdominal pain.   Headache.   Lethargy or a change in your level of consciousness.  Difficulty walking or moving your legs.    Numbness in the legs.   Paralysis.  Shortness of breath.   Confusion.   Repeated vomiting.  Document Released: 06/22/2000 Document Revised: 04/15/2013 Document Reviewed: 12/03/2012 [facility] Patient Information 2015 [place], [place]. This information is not intended to replace advice given to you by your health care provider. Make sure you discuss any questions you have with your health care provider.

## 2014-03-02 DIAGNOSIS — Z1231 Encounter for screening mammogram for malignant neoplasm of breast: Secondary | ICD-10-CM | POA: Diagnosis not present

## 2014-03-04 ENCOUNTER — Encounter: Payer: Self-pay | Admitting: Nurse Practitioner

## 2014-03-19 ENCOUNTER — Encounter: Payer: Self-pay | Admitting: Nurse Practitioner

## 2014-03-19 ENCOUNTER — Ambulatory Visit (INDEPENDENT_AMBULATORY_CARE_PROVIDER_SITE_OTHER): Payer: Medicare Other | Admitting: Nurse Practitioner

## 2014-03-19 VITALS — BP 94/63 | HR 70 | Temp 98.0°F | Resp 18 | Ht 65.0 in | Wt 105.0 lb

## 2014-03-19 DIAGNOSIS — F411 Generalized anxiety disorder: Secondary | ICD-10-CM | POA: Diagnosis not present

## 2014-03-19 DIAGNOSIS — R5381 Other malaise: Secondary | ICD-10-CM

## 2014-03-19 DIAGNOSIS — R002 Palpitations: Secondary | ICD-10-CM | POA: Diagnosis not present

## 2014-03-19 DIAGNOSIS — F418 Other specified anxiety disorders: Secondary | ICD-10-CM | POA: Insufficient documentation

## 2014-03-19 DIAGNOSIS — Z23 Encounter for immunization: Secondary | ICD-10-CM | POA: Diagnosis not present

## 2014-03-19 DIAGNOSIS — R5383 Other fatigue: Secondary | ICD-10-CM

## 2014-03-19 HISTORY — DX: Other specified anxiety disorders: F41.8

## 2014-03-19 MED ORDER — LORAZEPAM 0.5 MG PO TABS: 0.5000 mg | ORAL_TABLET | Freq: Two times a day (BID) | ORAL | Status: DC | PRN

## 2014-03-19 NOTE — Patient Instructions (Signed)
I think you are experiencing grief. Grief begins as soon as we realize life will not be the same for a loved one. lAnxiety & depression can be components of grief. Be patient with yourself.  Consider cognitive behavioral therapy with a licensed counselor. I will be happy to refer you. Take anti-anxiety medication when you are having palpitations or feeling anxious. Do not drive the first time you take it, as it may make you drowsy. Take half tablet, if it makes you feel too sleepy.  Apply benadryl cream on itchy lesions. They look like they may be flea bites.  Return in 2 weeks.  Grief Reaction Grief is a normal response to the death of someone close to you. Feelings of fear, anger, and guilt can affect almost everyone who loses someone they love. Symptoms of depression are also common. These include problems with sleep, loss of appetite, and lack of energy. These grief reaction symptoms often last for weeks to months after a loss. They may also return during special times that remind you of the person you lost, such as an anniversary or birthday. Anxiety, insomnia, irritability, and deep depression may last beyond the period of normal grief. If you experience these feelings for 6 months or longer, you may have clinical depression. Clinical depression requires further medical attention. If you think that you have clinical depression, you should contact your caregiver. If you have a history of depression or a family history of depression, you are at greater risk of clinical depression. You are also at greater risk of developing clinical depression if the loss was traumatic or the loss was of someone with whom you had unresolved issues.  A grief reaction can become complicated by being blocked. This means being unable to cry or express extreme emotions. This may prolong the grieving period and worsen the emotional effects of the loss. Mourning is a natural event in human life. A healthy grief reaction is  one that is not blocked. It requires a time of sadness and readjustment. It is very important to share your sorrow and fear with others, especially close friends and family. Professional counselors and clergy can also help you process your grief. Document Released: 06/25/2005 Document Revised: 11/09/2013 Document Reviewed: 03/05/2006 [facility] Patient Information 2015 [place], [place]. This information is not intended to replace advice given to you by your health care provider. Make sure you discuss any questions you have with your health care provider.

## 2014-03-19 NOTE — Progress Notes (Signed)
Pre visit review using our clinic review tool, if applicable. No additional management support is needed unless otherwise documented below in the visit note. 

## 2014-03-20 NOTE — Progress Notes (Signed)
Subjective:    Kelsey [name] is a 68 y.o. female who presents with palpitations and fatigue that is overwhelming at times. She associates symptoms with thoughts of husband's decline with alzheimer's.. The symptoms are moderate, occur intermittently, and last a few seconds per episode. Cardiac risk factors include: fam hx; mother had arrythmia. Aggravating factors: stress/anxiety. Relieving factors: none. Patient denies: abdominal pain, calf pain, chest pain, cough, dizziness, leg swelling, shortness of breath and syncope. She is tearful as she discusses life changes associated with husband's illness, family dynamics, & personal feelings. Also, she c/o 3 or 4 lesions on trunk & shoulder-itchy, thinks from flea bites from pets.   The following portions of the patient's history were reviewed and updated as appropriate: allergies, current medications, past family history, past medical history, past social history, past surgical history and problem list.  Review of Systems Pertinent items are noted in HPI.   Objective:    BP 94/63  Pulse 70  Temp(Src) 98 F (36.7 C) (Oral)  Resp 18  Ht 5\' 5"  (1.651 m)  Wt 105 lb (47.628 kg)  BMI 17.47 kg/m2  SpO2 96% General appearance: alert, cooperative, appears stated age, cachectic, mild distress, pale and tearful Head: Normocephalic, without obvious abnormality, atraumatic Eyes: negative findings: lids and lashes normal and conjunctivae and sclerae normal Neck: no carotid bruit, supple, symmetrical, trachea midline and thyroid not enlarged, symmetric, no tenderness/mass/nodules Lungs: clear to auscultation bilaterally Heart: regular rate and rhythm, S1, S2 normal, no murmur, click, rub or gallop Pulses: 2+ and symmetric Skin: few papular lesions trunk & shoulder. Pt states she has pets & lesions my be from flea bites. NO insect parts in lesions.  Cardiographics ECG: normal sinus rhythm   Assessment:  1. Situational anxiety Think this is grief  reaction as life changes for her & husband due to his illness. - LORazepam (ATIVAN) 0.5 MG tablet; Take 1 tablet (0.5 mg total) by mouth 2 (two) times daily as needed for anxiety.  Dispense: 30 tablet; Refill: 1 -Ref BH for grief therapy  2. Palpitations Anxiety related - EKG 12-Lead- NSR  3. Other malaise and fatigue Associated w/palps-likely r/t situational anxiety/grief  4. Need for prophylactic vaccination and inoculation against influenza - Flu Vaccine QUAD 36+ mos PF IM (Fluarix Quad PF)  See problem list for complete A&P See pt instructions. F/u 2 wks

## 2014-03-22 ENCOUNTER — Telehealth: Payer: Self-pay | Admitting: Nurse Practitioner

## 2014-03-22 NOTE — Telephone Encounter (Signed)
Patient says [facility] did not receive Rx for anxiety medication

## 2014-03-23 ENCOUNTER — Encounter: Payer: Self-pay | Admitting: Family Medicine

## 2014-03-23 DIAGNOSIS — Z23 Encounter for immunization: Secondary | ICD-10-CM | POA: Diagnosis not present

## 2014-03-23 NOTE — Telephone Encounter (Signed)
Spoke with pt today and advised her [facility] because it was not written for 90 days. Pt understood and will pick up [facility] at [facility].

## 2014-03-24 ENCOUNTER — Other Ambulatory Visit: Payer: Self-pay | Admitting: Nurse Practitioner

## 2014-03-24 DIAGNOSIS — Z23 Encounter for immunization: Secondary | ICD-10-CM

## 2014-03-24 MED ORDER — TYPHOID VI POLYSACCHARIDE VACC 25 MCG/0.5ML IM SOLN: 0.5000 mL | Freq: Once | INTRAMUSCULAR | Status: DC

## 2014-03-24 MED ORDER — TYPHOID VI POLYSACCHARIDE VACC 25 MCG/0.5ML IM SOLN
0.5000 mL | Freq: Once | INTRAMUSCULAR | Status: DC
Start: 2014-03-24 — End: 2014-03-24

## 2014-03-24 MED ORDER — TYPHOID VACCINE PO CPDR: 1.0000 | DELAYED_RELEASE_CAPSULE | ORAL | Status: DC

## 2014-03-24 NOTE — Progress Notes (Signed)
Pt requests prescription for typhoid vaccine.

## 2014-03-26 ENCOUNTER — Encounter: Payer: Medicare Other | Admitting: Internal Medicine

## 2014-03-30 ENCOUNTER — Ambulatory Visit (INDEPENDENT_AMBULATORY_CARE_PROVIDER_SITE_OTHER): Payer: Self-pay

## 2014-03-30 DIAGNOSIS — F4320 Adjustment disorder, unspecified: Secondary | ICD-10-CM

## 2014-04-02 ENCOUNTER — Encounter: Payer: Self-pay | Admitting: Nurse Practitioner

## 2014-04-02 ENCOUNTER — Ambulatory Visit: Payer: Medicare Other | Admitting: Nurse Practitioner

## 2014-04-02 ENCOUNTER — Ambulatory Visit (INDEPENDENT_AMBULATORY_CARE_PROVIDER_SITE_OTHER): Payer: Medicare Other | Admitting: Nurse Practitioner

## 2014-04-02 VITALS — BP 92/54 | HR 70 | Temp 97.9°F | Resp 18 | Ht 65.0 in | Wt 105.0 lb

## 2014-04-02 DIAGNOSIS — F411 Generalized anxiety disorder: Secondary | ICD-10-CM | POA: Diagnosis not present

## 2014-04-02 DIAGNOSIS — Z23 Encounter for immunization: Secondary | ICD-10-CM | POA: Diagnosis not present

## 2014-04-02 DIAGNOSIS — F418 Other specified anxiety disorders: Secondary | ICD-10-CM

## 2014-04-02 NOTE — Patient Instructions (Addendum)
I will contact you regarding more resources.   Nice to talk with you!

## 2014-04-02 NOTE — Progress Notes (Signed)
Pre visit review using our clinic review tool, if applicable. No additional management support is needed unless otherwise documented below in the visit note. 

## 2014-04-05 ENCOUNTER — Telehealth: Payer: Self-pay | Admitting: Nurse Practitioner

## 2014-04-05 NOTE — Telephone Encounter (Signed)
pls call pt: Advise I recommend Kelsey [name] for counseling. She is with [facility] in [place]. [phone]. Pt may call to schedule appt. (I spoke w/[name] about [name]'s specific needs).  Also, please give her info. for Alzheimer caregiver support group: there is one at [facility] in [place] & at [employer] [facility] [employer]. She can call [phone] or visit [email or web address] for more info. (time & date of groups).

## 2014-04-05 NOTE — Progress Notes (Signed)
Subjective:     Kelsey [name] is a 68 y.o. female who presents for follow up of anxiety disorder. 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 She is experiencing grief over his illness and caregiver burden. She has had a few episodes of feeling panicky with palpitations & and overwhelming emotion. I have encouraged her to seek counseling-she had 1 session. She is also attending a support group.  She is feeling some better-having more patient responses to husband.  She has not used ativan, but has a script in case she experiences panic with palpitations.   The following portions of the patient's history were reviewed and updated as appropriate: allergies, current medications, past medical history, past social history, past surgical history and problem list.    Objective:    BP 92/54  Pulse 70  Temp(Src) 97.9 F (36.6 C) (Oral)  Resp 18  Ht 5\' 5"  (1.651 m)  Wt 105 lb (47.628 kg)  BMI 17.47 kg/m2  SpO2 100%  General:  alert, cooperative, appears stated age and no distress  Affect/Behavior:  full facial expressions, good grooming, good insight, normal perception, normal reasoning, normal speech pattern and content and normal thought patterns       Assessment:   1. Need for prophylactic vaccination against Streptococcus pneumoniae (pneumococcus) - Pneumococcal conjugate vaccine 13-valent  2. Situational anxiety Use ativan PRN panic attacks & heightened anxiety [facility] support groups through [email or web address] Call placed to [facility] to get referral for best counselor for grief/caregiver burden F/u PRN

## 2014-04-05 NOTE — Assessment & Plan Note (Signed)
Pt has not tried ativan. She wants to have it in case she feels panicky again. I have encouraged her to seek alzheimer's caregiver support groups. She had 1 counseling session at [facility]. She exrepsses that she feels some better & is having more self-control w/responses to husband.

## 2014-04-06 ENCOUNTER — Telehealth: Payer: Self-pay

## 2014-04-06 NOTE — Telephone Encounter (Signed)
Hep A vaccine documentation

## 2014-04-06 NOTE — Telephone Encounter (Signed)
Spoke with pt, I gave information from [place]. Pt understood.

## 2014-04-23 ENCOUNTER — Other Ambulatory Visit: Payer: Self-pay

## 2014-04-23 DIAGNOSIS — Z23 Encounter for immunization: Secondary | ICD-10-CM

## 2014-04-23 MED ORDER — TYPHOID VACCINE PO CPDR: 1.0000 | DELAYED_RELEASE_CAPSULE | ORAL | Status: DC

## 2014-04-23 NOTE — Telephone Encounter (Signed)
Pt called and requested to send Typhoid Rx to [facility] because it is cheaper there than at [facility]. Sent Rx to [facility].

## 2014-05-04 ENCOUNTER — Other Ambulatory Visit: Payer: Self-pay | Admitting: *Deleted

## 2014-05-04 MED ORDER — OMEPRAZOLE 20 MG PO CPDR: 20.0000 mg | DELAYED_RELEASE_CAPSULE | Freq: Every day | ORAL | Status: DC

## 2014-05-31 ENCOUNTER — Ambulatory Visit (INDEPENDENT_AMBULATORY_CARE_PROVIDER_SITE_OTHER): Payer: Medicare Other | Admitting: Nurse Practitioner

## 2014-05-31 ENCOUNTER — Encounter: Payer: Self-pay | Admitting: Nurse Practitioner

## 2014-05-31 VITALS — BP 98/60 | HR 68 | Temp 97.2°F | Resp 18 | Ht 65.0 in | Wt 102.0 lb

## 2014-05-31 DIAGNOSIS — A09 Infectious gastroenteritis and colitis, unspecified: Secondary | ICD-10-CM

## 2014-05-31 DIAGNOSIS — N390 Urinary tract infection, site not specified: Secondary | ICD-10-CM | POA: Diagnosis not present

## 2014-05-31 LAB — CBC WITH DIFFERENTIAL/PLATELET
BASOS ABS: 0 10*3/uL (ref 0.0–0.1)
Basophils Relative: 0.5 % (ref 0.0–3.0)
Eosinophils Absolute: 0.1 10*3/uL (ref 0.0–0.7)
Eosinophils Relative: 2.1 % (ref 0.0–5.0)
HEMATOCRIT: 39 % (ref 36.0–46.0)
Hemoglobin: 12.9 g/dL (ref 12.0–15.0)
LYMPHS ABS: 1.1 10*3/uL (ref 0.7–4.0)
Lymphocytes Relative: 23.5 % (ref 12.0–46.0)
MCHC: 33.1 g/dL (ref 30.0–36.0)
MCV: 95.2 fl (ref 78.0–100.0)
MONO ABS: 0.3 10*3/uL (ref 0.1–1.0)
Monocytes Relative: 6.5 % (ref 3.0–12.0)
Neutro Abs: 3.1 10*3/uL (ref 1.4–7.7)
Neutrophils Relative %: 67.4 % (ref 43.0–77.0)
Platelets: 277 10*3/uL (ref 150.0–400.0)
RBC: 4.1 Mil/uL (ref 3.87–5.11)
RDW: 13.9 % (ref 11.5–15.5)
WBC: 4.6 10*3/uL (ref 4.0–10.5)

## 2014-05-31 LAB — COMPREHENSIVE METABOLIC PANEL
ALT: 23 U/L (ref 0–35)
AST: 24 U/L (ref 0–37)
Albumin: 4.3 g/dL (ref 3.5–5.2)
Alkaline Phosphatase: 69 U/L (ref 39–117)
BILIRUBIN TOTAL: 0.8 mg/dL (ref 0.2–1.2)
BUN: 14 mg/dL (ref 6–23)
CO2: 26 mEq/L (ref 19–32)
CREATININE: 0.6 mg/dL (ref 0.4–1.2)
Calcium: 9.5 mg/dL (ref 8.4–10.5)
Chloride: 101 mEq/L (ref 96–112)
GFR: 103.49 mL/min (ref >60.00)
Glucose, Bld: 84 mg/dL (ref 70–99)
Potassium: 5 mEq/L (ref 3.5–5.1)
Sodium: 139 mEq/L (ref 135–145)
Total Protein: 7.1 g/dL (ref 6.0–8.3)

## 2014-05-31 LAB — URINALYSIS, MICROSCOPIC ONLY

## 2014-05-31 NOTE — Progress Notes (Signed)
Pre visit review using our clinic review tool, if applicable. No additional management support is needed unless otherwise documented below in the visit note. 

## 2014-05-31 NOTE — Patient Instructions (Signed)
For rash start 10 mg claritin & 20 mg pepcid DAILY for 10 to 14 days.  Continue to increase fluids while having diarrhea. Do not take immodium.  My office will call with results.  Nice to see you!  Happy Thanksgiving!

## 2014-05-31 NOTE — Progress Notes (Signed)
Subjective:     Kelsey [name] is a 68 y.o. female who presents for evaluation of diarrhea & papular rash on face. Onset of diarrhea was 4 days ago. Diarrhea is occurring approximately 3 times per day. Patient describes diarrhea as watery, occurring after meals. Today stool was not watery, but loose.Diarrhea has been associated with anorexia, & rash occured on face yesterday. Patient denies blood in stool, fever, recent antibiotic use, significant abdominal pain. Pt returned 4 days ago from 1 month travel to [place], [place] & [place]. She took all vaccines & meds recommended by CDC, but states she forgot last dose of "typhoid" med (had 4 out of 5 doses). Previous visits for diarrhea: none. Evaluation to date: none.  Treatment to date: nothing. She denies fever, HA, nasal congestion, fatigue.  The following portions of the patient's history were reviewed and updated as appropriate: allergies, current medications, past medical history, past social history, past surgical history and problem list.  Review of Systems Constitutional: positive for anorexia and weight loss, negative for chills, fevers and night sweats Eyes: negative for irritation, redness and visual disturbance Ears, nose, mouth, throat, and face: negative for hoarseness, nasal congestion and sore throat Respiratory: negative for cough Cardiovascular: negative for chest pressure/discomfort, irregular heart beat and near-syncope Gastrointestinal: positive for change in bowel habits and diarrhea, negative for abdominal pain, dyspepsia, nausea and reflux symptoms Genitourinary:negative for dysuria Integument/breast: positive for rash Musculoskeletal:negative for arthralgias, muscle weakness and myalgias Neurological: negative for headaches    Objective:    BP 98/60 mmHg  Pulse 68  Temp(Src) 97.2 F (36.2 C) (Temporal)  Resp 18  Ht 5\' 5"  (1.651 m)  Wt 102 lb (46.267 kg)  BMI 16.97 kg/m2  SpO2 98% General: alert,  cooperative, appears stated age, cachectic and no distress  Hydration:  well hydrated  Abdomen: Lymph nodes: Lungs: Heart:    soft, non-tender; bowel sounds normal; no masses,  no organomegaly  No cervical or [place] LAD  BS clear RRR, no murmur    Assessment:plan   1. Traveler's diarrhea Most common illnesses in region of travel ([place], [place], & [place]) are Yellow fever, malaria, & typhoid. Symptoms most c/w typhoid (rash & anorexia) DD: viral gastritis - CBC with Differential - Comprehensive metabolic panel - Stool culture (consider typhoid if pos for shigella or salmonella) - Ova and parasite examination - Clostridium Difficile by PCR - Giardia/cryptosporidium (EIA) - Urine Microscopic - Sedimentation rate (typhoid) - Urine culture (typhoid) - Protime-INR (increased w/yellow fever) - Lactate dehydrogenase (elevated in malaria)  10 mg claritin, 20 mg pepcid daily for rash. See patient instructions for complete plan. F/u PRN symptoms & labs

## 2014-06-01 ENCOUNTER — Other Ambulatory Visit: Payer: Self-pay | Admitting: Nurse Practitioner

## 2014-06-01 DIAGNOSIS — A09 Infectious gastroenteritis and colitis, unspecified: Secondary | ICD-10-CM | POA: Diagnosis not present

## 2014-06-01 LAB — SEDIMENTATION RATE: SED RATE: 11 mm/h (ref 0–22)

## 2014-06-01 LAB — URINE CULTURE
Colony Count: NO GROWTH
Organism ID, Bacteria: NO GROWTH

## 2014-06-01 LAB — LACTATE DEHYDROGENASE: LDH: 161 U/L (ref 94–250)

## 2014-06-02 ENCOUNTER — Telehealth: Payer: Self-pay | Admitting: Nurse Practitioner

## 2014-06-02 LAB — OVA AND PARASITE EXAMINATION: OP: NONE SEEN

## 2014-06-02 LAB — CLOSTRIDIUM DIFFICILE BY PCR: Toxigenic C. Difficile by PCR: NOT DETECTED

## 2014-06-02 LAB — GIARDIA/CRYPTOSPORIDIUM (EIA)
Cryptosporidium Screen (EIA): NEGATIVE
GIARDIA SCREEN (EIA): NEGATIVE

## 2014-06-02 NOTE — Telephone Encounter (Signed)
Called pt, left detailed message on voicemail and advised to call back if needed.

## 2014-06-02 NOTE — Telephone Encounter (Signed)
pls call pt: Advise So far all results have come back neg for infection. Still waiting on some of the stool tests. I will check results over weekend & call her if needed. Pls ask if still having symptoms.

## 2014-06-04 ENCOUNTER — Telehealth: Payer: Self-pay | Admitting: Nurse Practitioner

## 2014-06-04 NOTE — Telephone Encounter (Signed)
pls call pt: Advise All stool studies nml. Pls ask if symptoms have resloved (diarrhea, rash).

## 2014-06-07 NOTE — Telephone Encounter (Signed)
Pt left a voicemail on my machine stating she is feeling better and her symptoms have subsided. Advised Layne.

## 2014-06-07 NOTE — Telephone Encounter (Signed)
Left message for pt to call back  °

## 2014-07-26 ENCOUNTER — Telehealth: Payer: Self-pay | Admitting: Nurse Practitioner

## 2014-07-26 MED ORDER — OMEPRAZOLE 20 MG PO CPDR: 20.0000 mg | DELAYED_RELEASE_CAPSULE | Freq: Every day | ORAL | Status: DC

## 2014-07-26 NOTE — Telephone Encounter (Signed)
Omeprazole [facility]

## 2014-11-18 DIAGNOSIS — H524 Presbyopia: Secondary | ICD-10-CM | POA: Diagnosis not present

## 2014-11-18 DIAGNOSIS — H2513 Age-related nuclear cataract, bilateral: Secondary | ICD-10-CM | POA: Diagnosis not present

## 2014-12-07 ENCOUNTER — Ambulatory Visit (INDEPENDENT_AMBULATORY_CARE_PROVIDER_SITE_OTHER): Payer: Medicare Other | Admitting: Nurse Practitioner

## 2014-12-07 ENCOUNTER — Encounter: Payer: Self-pay | Admitting: Nurse Practitioner

## 2014-12-07 VITALS — BP 92/61 | HR 66 | Temp 97.3°F | Ht 65.0 in | Wt 108.0 lb

## 2014-12-07 DIAGNOSIS — M2170 Unequal limb length (acquired), unspecified site: Secondary | ICD-10-CM

## 2014-12-07 DIAGNOSIS — E7800 Pure hypercholesterolemia, unspecified: Secondary | ICD-10-CM

## 2014-12-07 DIAGNOSIS — M810 Age-related osteoporosis without current pathological fracture: Secondary | ICD-10-CM

## 2014-12-07 DIAGNOSIS — R202 Paresthesia of skin: Secondary | ICD-10-CM | POA: Diagnosis not present

## 2014-12-07 DIAGNOSIS — R636 Underweight: Secondary | ICD-10-CM

## 2014-12-07 DIAGNOSIS — K219 Gastro-esophageal reflux disease without esophagitis: Secondary | ICD-10-CM | POA: Insufficient documentation

## 2014-12-07 LAB — LIPID PANEL
CHOL/HDL RATIO: 3
Cholesterol: 254 mg/dL — ABNORMAL HIGH (ref 0–200)
HDL: 99.6 mg/dL (ref >39.00)
LDL CALC: 142 mg/dL — AB (ref 0–99)
NonHDL: 154.4
Triglycerides: 63 mg/dL (ref 0.0–149.0)
VLDL: 12.6 mg/dL (ref 0.0–40.0)

## 2014-12-07 LAB — COMPREHENSIVE METABOLIC PANEL
ALT: 17 U/L (ref 0–35)
AST: 24 U/L (ref 0–37)
Albumin: 4.4 g/dL (ref 3.5–5.2)
Alkaline Phosphatase: 62 U/L (ref 39–117)
BUN: 16 mg/dL (ref 6–23)
CALCIUM: 9.2 mg/dL (ref 8.4–10.5)
CHLORIDE: 103 meq/L (ref 96–112)
CO2: 29 mEq/L (ref 19–32)
CREATININE: 0.59 mg/dL (ref 0.40–1.20)
GFR: 107.38 mL/min (ref >60.00)
Glucose, Bld: 78 mg/dL (ref 70–99)
Potassium: 4.9 mEq/L (ref 3.5–5.1)
SODIUM: 138 meq/L (ref 135–145)
TOTAL PROTEIN: 7.1 g/dL (ref 6.0–8.3)
Total Bilirubin: 0.7 mg/dL (ref 0.2–1.2)

## 2014-12-07 LAB — CBC
HEMATOCRIT: 38.3 % (ref 36.0–46.0)
HEMOGLOBIN: 12.7 g/dL (ref 12.0–15.0)
MCHC: 33 g/dL (ref 30.0–36.0)
MCV: 94.8 fl (ref 78.0–100.0)
Platelets: 246 10*3/uL (ref 150.0–400.0)
RBC: 4.04 Mil/uL (ref 3.87–5.11)
RDW: 13.9 % (ref 11.5–15.5)
WBC: 4.9 10*3/uL (ref 4.0–10.5)

## 2014-12-07 LAB — TSH: TSH: 1.9 u[IU]/mL (ref 0.35–4.50)

## 2014-12-07 LAB — VITAMIN D 25 HYDROXY (VIT D DEFICIENCY, FRACTURES): VITD: 38.22 ng/mL (ref 30.00–100.00)

## 2014-12-07 NOTE — Patient Instructions (Signed)
Please see [facility] [place] to evaluate arm tingling.  Decrease omeprazole to every other day, continue to decrease as tolerated. You may take only when needed.  Please get bone density study in September. Schedule appointment after test to discuss.  Continue with 300-400 mg calcium supplement daily. Eat green leafy vegetables to get calcium in diet.   My office will call with lab results.  Great to see you!

## 2014-12-07 NOTE — Progress Notes (Signed)
Pre visit review using our clinic review tool, if applicable. No additional management support is needed unless otherwise documented below in the visit note. 

## 2014-12-07 NOTE — Progress Notes (Addendum)
Subjective:     Kelsey [name] is a 69 y.o. female presents for f/u osteoporosis & reflux, and has new c/o L arm heaviness, & chronic back pain, reviewed preventive care. Osteoporosis:  Last dexa 2013. Last reclast infusion 04/2014. 1st reclast 2010. prolia 2012, 2013. Fosamax before 2010. Will repeat DEXA this year. Gets regular wt bearing exercise, eats healthy diet, although underweight. Taking 4000 iu Vit D3 daily. Takes calcium supplement.  Reflux: no symptoms. Taking omeprazole 20 mg qd. Started after taking fosamax. She stopped for 1 week & had epigastric "fullness'. Resumed med w/resolve of symptopms. Concerned about daily use. Discussed decreasing to qod if no symptoms & wean to PRN use.  L arm heavy: onset several weeks. Associated: paresthesia in arm & hand, pain w/neck extension. Notices when driving. Denies weakness. 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  Yoga & stretches are helpful. Onset-several yrs. Pain is stable.  Preventive care: MMG 2015 nml, colonoscopy int hemorrhoids. Vaccines UTD. LDL slightly high, but HDL high. Reflux: started after taking fosamax. No symptoms using omep 20 mg qd. Suggest she cut back to qod & continue wean if no symptoms. Take PRN if able.    The following portions of the patient's history were reviewed and updated as appropriate: allergies, current medications, past medical history, past social history, past surgical history and problem list.  Review of Systems Allergy: takes claritin PRN with relief. Eyes: recent eye exam. No vision change, mild cataract  Objective:    BP 92/61 mmHg  Pulse 66  Temp(Src) 97.3 F (36.3 C) (Oral)  Ht 5\' 5"  (1.651 m)  Wt 108 lb (48.988 kg)  BMI 17.97 kg/m2  SpO2 96% BP 92/61 mmHg  Pulse 66  Temp(Src) 97.3 F (36.3 C) (Oral)  Ht 5\' 5"  (1.651 m)  Wt 108 lb (48.988 kg)  BMI 17.97 kg/m2  SpO2 96% General appearance: alert, cooperative, appears stated age and no  distress Head: Normocephalic, without obvious abnormality, atraumatic Eyes: negative findings: lids and lashes normal, conjunctivae and sclerae normal, corneas clear and pupils equal, round, reactive to light and accomodation Ears: normal TM's and external ear canals both ears Neck: no adenopathy, no carotid bruit, supple, symmetrical, trachea midline and thyroid not enlarged, symmetric, no tenderness/mass/nodules No spinal tenderness Nor spinal accessory tenderness. Slight limited ROM w/extension & rotation to L  Lungs: clear to auscultation bilaterally Heart: regular rate and rhythm, S1, S2 normal, no murmur, click, rub or gallop Abdomen: soft, non-tender; bowel sounds normal; no masses,  no organomegaly Extremities: extremities normal, atraumatic, no cyanosis or edema Pulses: 2+ and symmetric Skin: Skin color, texture, turgor normal. No rashes or lesions Lymph nodes: Cervical, supraclavicular, and axillary nodes normal. Neurologic: Grossly normal  Back: R shoulder higher than L, R scapula slightly higher than L  Assessment:Plan     1. Elevated LDL cholesterol level High HDL - Lipid panel - Comprehensive metabolic panel - CBC  2. Osteoporosis DEXA in Sept. Continue wt bearing exercise, vit d & calcium supplement. limit meat to minimize calcium loss.  - Vit D  25 hydroxy (rtn osteoporosis monitoring) - Comprehensive metabolic panel - CBC  3. Underweight - TSH  4. Arm paresthesia, left Likely cervical radiculopathy - Ambulatory referral to Spine Surgery  5. Leg length discrepancy Likely cause of back pain - Ambulatory referral to Spine Surgery  6. Reflux Decrease omep 20 mg to qod  F/u 6 mos-after DEXA scan

## 2014-12-08 ENCOUNTER — Encounter: Payer: Self-pay | Admitting: Nurse Practitioner

## 2014-12-08 ENCOUNTER — Telehealth: Payer: Self-pay | Admitting: Nurse Practitioner

## 2014-12-08 ENCOUNTER — Other Ambulatory Visit: Payer: Self-pay | Admitting: Nurse Practitioner

## 2014-12-08 DIAGNOSIS — M810 Age-related osteoporosis without current pathological fracture: Secondary | ICD-10-CM

## 2014-12-08 NOTE — Telephone Encounter (Signed)
Called and informed patient of lab results.  

## 2014-12-08 NOTE — Telephone Encounter (Signed)
pls call pt: Advise Labs look great! Tell her how to sign up for mychart, so she can see lab numbers. Increase vit D3 to 5000 iu qd. Take with meal for best absorption.

## 2014-12-13 DIAGNOSIS — Z681 Body mass index (BMI) 19 or less, adult: Secondary | ICD-10-CM | POA: Diagnosis not present

## 2014-12-13 DIAGNOSIS — M542 Cervicalgia: Secondary | ICD-10-CM | POA: Diagnosis not present

## 2015-01-03 ENCOUNTER — Other Ambulatory Visit: Payer: Self-pay | Admitting: Family Medicine

## 2015-01-03 MED ORDER — OMEPRAZOLE 20 MG PO CPDR: 20.0000 mg | DELAYED_RELEASE_CAPSULE | Freq: Every day | ORAL | Status: DC

## 2015-04-04 DIAGNOSIS — M5412 Radiculopathy, cervical region: Secondary | ICD-10-CM | POA: Diagnosis not present

## 2015-04-04 DIAGNOSIS — M5030 Other cervical disc degeneration, unspecified cervical region: Secondary | ICD-10-CM | POA: Diagnosis not present

## 2015-04-04 DIAGNOSIS — M4712 Other spondylosis with myelopathy, cervical region: Secondary | ICD-10-CM | POA: Diagnosis not present

## 2015-04-20 DIAGNOSIS — M5030 Other cervical disc degeneration, unspecified cervical region: Secondary | ICD-10-CM | POA: Diagnosis not present

## 2015-04-20 DIAGNOSIS — M4712 Other spondylosis with myelopathy, cervical region: Secondary | ICD-10-CM | POA: Diagnosis not present

## 2015-04-26 DIAGNOSIS — M436 Torticollis: Secondary | ICD-10-CM | POA: Diagnosis not present

## 2015-04-26 DIAGNOSIS — R293 Abnormal posture: Secondary | ICD-10-CM | POA: Diagnosis not present

## 2015-04-26 DIAGNOSIS — M5030 Other cervical disc degeneration, unspecified cervical region: Secondary | ICD-10-CM | POA: Diagnosis not present

## 2015-04-26 DIAGNOSIS — M542 Cervicalgia: Secondary | ICD-10-CM | POA: Diagnosis not present

## 2015-05-03 DIAGNOSIS — M542 Cervicalgia: Secondary | ICD-10-CM | POA: Diagnosis not present

## 2015-05-03 DIAGNOSIS — M436 Torticollis: Secondary | ICD-10-CM | POA: Diagnosis not present

## 2015-05-03 DIAGNOSIS — R293 Abnormal posture: Secondary | ICD-10-CM | POA: Diagnosis not present

## 2015-05-03 DIAGNOSIS — M5030 Other cervical disc degeneration, unspecified cervical region: Secondary | ICD-10-CM | POA: Diagnosis not present

## 2015-05-05 ENCOUNTER — Telehealth: Payer: Self-pay | Admitting: Nurse Practitioner

## 2015-05-05 DIAGNOSIS — M810 Age-related osteoporosis without current pathological fracture: Secondary | ICD-10-CM

## 2015-05-05 NOTE — Telephone Encounter (Signed)
Patient has osteoporosis and is due for bone density. She had her last one at [facility]. Please put in order. Thanks

## 2015-05-06 ENCOUNTER — Telehealth: Payer: Self-pay | Admitting: *Deleted

## 2015-05-06 DIAGNOSIS — M5030 Other cervical disc degeneration, unspecified cervical region: Secondary | ICD-10-CM | POA: Diagnosis not present

## 2015-05-06 DIAGNOSIS — M810 Age-related osteoporosis without current pathological fracture: Secondary | ICD-10-CM

## 2015-05-06 DIAGNOSIS — M542 Cervicalgia: Secondary | ICD-10-CM | POA: Diagnosis not present

## 2015-05-06 DIAGNOSIS — R293 Abnormal posture: Secondary | ICD-10-CM | POA: Diagnosis not present

## 2015-05-06 DIAGNOSIS — M436 Torticollis: Secondary | ICD-10-CM | POA: Diagnosis not present

## 2015-05-06 NOTE — Telephone Encounter (Signed)
Thanks

## 2015-05-06 NOTE — Telephone Encounter (Signed)
Order placed for Bone Density Scan . Ready to schedule.

## 2015-05-06 NOTE — Telephone Encounter (Signed)
Order placed for bone density scan.

## 2015-05-13 DIAGNOSIS — R293 Abnormal posture: Secondary | ICD-10-CM | POA: Diagnosis not present

## 2015-05-13 DIAGNOSIS — M5030 Other cervical disc degeneration, unspecified cervical region: Secondary | ICD-10-CM | POA: Diagnosis not present

## 2015-05-13 DIAGNOSIS — M542 Cervicalgia: Secondary | ICD-10-CM | POA: Diagnosis not present

## 2015-05-13 DIAGNOSIS — M436 Torticollis: Secondary | ICD-10-CM | POA: Diagnosis not present

## 2015-05-16 DIAGNOSIS — Z1231 Encounter for screening mammogram for malignant neoplasm of breast: Secondary | ICD-10-CM | POA: Diagnosis not present

## 2015-05-16 DIAGNOSIS — M810 Age-related osteoporosis without current pathological fracture: Secondary | ICD-10-CM | POA: Diagnosis not present

## 2015-05-17 DIAGNOSIS — M542 Cervicalgia: Secondary | ICD-10-CM | POA: Diagnosis not present

## 2015-05-17 DIAGNOSIS — M5030 Other cervical disc degeneration, unspecified cervical region: Secondary | ICD-10-CM | POA: Diagnosis not present

## 2015-05-17 DIAGNOSIS — R293 Abnormal posture: Secondary | ICD-10-CM | POA: Diagnosis not present

## 2015-05-17 DIAGNOSIS — M436 Torticollis: Secondary | ICD-10-CM | POA: Diagnosis not present

## 2015-05-18 ENCOUNTER — Telehealth: Payer: Self-pay | Admitting: Family Medicine

## 2015-05-18 NOTE — Telephone Encounter (Signed)
Please call pt, her bone density scan results are consistent with prior results. Patient should continue  treatment  for her osteoporosis. Last treatment I noticed was 08/2013. I do not see additional medication on list that suggest changed in therapy. ( I see she has had prolia, reclast, boniva, fosamax,vit D/Ca in the past) - I have not evaluated pt, she was prior NP pt.  - if on current therapy please add to med list. If pt needs to discuss please encourage her to schedule an appt.  - continue screening every 2 years with bone scan recommneded

## 2015-05-19 DIAGNOSIS — M5030 Other cervical disc degeneration, unspecified cervical region: Secondary | ICD-10-CM | POA: Diagnosis not present

## 2015-05-19 DIAGNOSIS — M542 Cervicalgia: Secondary | ICD-10-CM | POA: Diagnosis not present

## 2015-05-19 NOTE — Telephone Encounter (Signed)
Left message for patient to call back to discuss treatment options.

## 2015-05-23 ENCOUNTER — Ambulatory Visit (INDEPENDENT_AMBULATORY_CARE_PROVIDER_SITE_OTHER): Payer: Medicare Other | Admitting: Family Medicine

## 2015-05-23 ENCOUNTER — Encounter: Payer: Self-pay | Admitting: Family Medicine

## 2015-05-23 VITALS — BP 112/78 | HR 82 | Resp 20 | Wt 110.5 lb

## 2015-05-23 DIAGNOSIS — M810 Age-related osteoporosis without current pathological fracture: Secondary | ICD-10-CM

## 2015-05-23 NOTE — Progress Notes (Signed)
   Subjective:    Patient ID: Kelsey [name], female    DOB: 1946-03-08, 69 y.o.   MRN: [id number]  HPI  Osteoporosis:pt with known osteoporosis, which is basically stable from prior bone density. Unknown by EMR review, pt knowledge and spine specialist when last treatment was given. Records indicated reclast 08/2014, however pt feels she has  Had two reclast treatments and one last year. Bone density results reviewed with pt today.    Past Medical History  Diagnosis Date  . Chicken pox as a child  . GERD (gastroesophageal reflux disease)   . Anemia     iron deficiency  . Hyperlipidemia   . Allergy   . 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 H/O measles   . H/O mumps   . Female bladder prolapse 09/13/2011  . Vaginitis 09/13/2011  . Dysuria 09/13/2011  . Ovarian cyst 09/14/2011  . Fibrocystic breast 09/14/2011  . 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 Osteoporosis     reclast in Oct  . Osteoarthrosis     right shoulder  . Uterine fibroid 11/2012    See Dr. Cletis Media note, surgery 11/2012   Allergies  Allergen Reactions  . Penicillins Rash  . Sulfa Antibiotics Rash    Under arm and thighs   Social History   Social History  . Marital Status: Married    Spouse Name: N/A  . Number of Children: N/A  . Years of Education: N/A   Occupational History  . Not on file.   Social History Main Topics  . Smoking status: Former Smoker -- 0.30 packs/day for 10 years    Types: Cigarettes    Quit date: 07/09/1978  . Smokeless tobacco: Never Used  . Alcohol Use: Yes     Comment: 1-2 glasses of wine daily and a 12 oz beer daily  . Drug Use: No  . Sexual Activity: No   Other Topics Concern  . Not on file   Social History Narrative      Review of Systems Negative, with the exception of above mentioned in HPI     Objective:   Physical Exam BP 112/78 mmHg  Pulse 82  Resp 20  Wt 110 lb 8 oz (50.122 kg)  SpO2 98% Gen: Afebrile. No acute distress. Nontoxic in appearance. Pleasant asian female.    Psych: Normal affect, dress and demeanor. Normal speech. Normal thought content and judgment.    Assessment & Plan:  1. Osteoporosis - pt with known osteoporosis, which is basically stable from prior bone density.  - Unknown by EMR review, pt knowledge and spine specialist when last treatment was given. Records indicated reclast 08/2014, however pt feels she has  Had two reclast treatments.  - Will attempt to contact insurance to obtain any reclast or prolia treatments since 08/2014.  - Reclast yearly indicated for treatment and will attempt to get pt back on this schedule. BMP will be checked prior to treatment.

## 2015-05-23 NOTE — Telephone Encounter (Signed)
Spoke with patient she is setting up appt to discuss treatment options for her osteoporosis.

## 2015-05-27 ENCOUNTER — Ambulatory Visit (INDEPENDENT_AMBULATORY_CARE_PROVIDER_SITE_OTHER): Payer: Medicare Other

## 2015-05-27 DIAGNOSIS — M542 Cervicalgia: Secondary | ICD-10-CM | POA: Diagnosis not present

## 2015-05-27 DIAGNOSIS — M436 Torticollis: Secondary | ICD-10-CM | POA: Diagnosis not present

## 2015-05-27 DIAGNOSIS — Z23 Encounter for immunization: Secondary | ICD-10-CM

## 2015-05-27 DIAGNOSIS — M5030 Other cervical disc degeneration, unspecified cervical region: Secondary | ICD-10-CM | POA: Diagnosis not present

## 2015-05-27 DIAGNOSIS — R293 Abnormal posture: Secondary | ICD-10-CM | POA: Diagnosis not present

## 2015-05-30 DIAGNOSIS — M5030 Other cervical disc degeneration, unspecified cervical region: Secondary | ICD-10-CM | POA: Diagnosis not present

## 2015-05-30 DIAGNOSIS — R293 Abnormal posture: Secondary | ICD-10-CM | POA: Diagnosis not present

## 2015-05-30 DIAGNOSIS — M542 Cervicalgia: Secondary | ICD-10-CM | POA: Diagnosis not present

## 2015-05-30 DIAGNOSIS — M436 Torticollis: Secondary | ICD-10-CM | POA: Diagnosis not present

## 2015-06-01 ENCOUNTER — Telehealth: Payer: Self-pay | Admitting: *Deleted

## 2015-06-01 DIAGNOSIS — M810 Age-related osteoporosis without current pathological fracture: Secondary | ICD-10-CM

## 2015-06-01 NOTE — Telephone Encounter (Signed)
Insurance does not have a record of anything being filed since 2015 for Reclast or Prolia,. Patient called she states she ran into [place] at the store and she dicussed it with her and she told patient that she did not order any additional Treatment since 2015

## 2015-06-06 MED ORDER — DENOSUMAB 60 MG/ML ~~LOC~~ SOLN: 60.0000 mg | Freq: Once | SUBCUTANEOUS | Status: DC

## 2015-06-06 NOTE — Telephone Encounter (Signed)
I have ordered Prolia. Please call pt and make her aware this has been ordered and when it is approved, she can have the medication administered in the office  By a nurse visit.

## 2015-06-07 ENCOUNTER — Encounter: Payer: Self-pay | Admitting: Family Medicine

## 2015-06-07 NOTE — Telephone Encounter (Signed)
Can you please get PA.  Thanks!

## 2015-06-08 DIAGNOSIS — M5030 Other cervical disc degeneration, unspecified cervical region: Secondary | ICD-10-CM | POA: Diagnosis not present

## 2015-06-08 DIAGNOSIS — M436 Torticollis: Secondary | ICD-10-CM | POA: Diagnosis not present

## 2015-06-08 DIAGNOSIS — M542 Cervicalgia: Secondary | ICD-10-CM | POA: Diagnosis not present

## 2015-06-08 DIAGNOSIS — R293 Abnormal posture: Secondary | ICD-10-CM | POA: Diagnosis not present

## 2015-06-13 NOTE — Telephone Encounter (Signed)
I have electronically submitted pt's info for Prolia insurance verification and will notify you once I have a response. Thank you. °

## 2015-06-21 NOTE — Telephone Encounter (Signed)
I have rec'd pt's insurance verification and her estimated responsibility is $0.  Please make pt aware this is an estimate and we will not know and exact amt until insurance(s) has/have paid.  I have sent a copy of the summary of benefits to be scanned into pt's chart.  If you have any questions, please let me know.  Thank you.

## 2015-06-22 NOTE — Telephone Encounter (Signed)
This was sent to me.

## 2015-06-24 NOTE — Telephone Encounter (Signed)
Prolia injection was ordered and should be in office late Monday.  Once we receive injection we can schedule patient.

## 2015-06-29 NOTE — Telephone Encounter (Signed)
Patient scheduled appointment for prolia injection 06/30/15.

## 2015-06-30 ENCOUNTER — Ambulatory Visit: Payer: Medicare Other

## 2015-07-05 ENCOUNTER — Ambulatory Visit (INDEPENDENT_AMBULATORY_CARE_PROVIDER_SITE_OTHER): Payer: Medicare Other | Admitting: Family Medicine

## 2015-07-05 ENCOUNTER — Encounter: Payer: Self-pay | Admitting: Family Medicine

## 2015-07-05 ENCOUNTER — Ambulatory Visit: Payer: Medicare Other

## 2015-07-05 VITALS — BP 90/64 | HR 89 | Temp 98.3°F | Resp 20 | Wt 107.5 lb

## 2015-07-05 DIAGNOSIS — J320 Chronic maxillary sinusitis: Secondary | ICD-10-CM | POA: Insufficient documentation

## 2015-07-05 DIAGNOSIS — J0100 Acute maxillary sinusitis, unspecified: Secondary | ICD-10-CM

## 2015-07-05 MED ORDER — DOXYCYCLINE HYCLATE 100 MG PO TABS: 100.0000 mg | ORAL_TABLET | Freq: Two times a day (BID) | ORAL | Status: DC

## 2015-07-05 NOTE — Progress Notes (Signed)
   Subjective:    Patient ID: Kelsey [name], female    DOB: [birth year], 69 y.o.   MRN: [id number]  HPI  URI-like symptoms: Patient states roughly 1 week ago she was at the gym working out, and she came out from the gym she received home with the windows down. She feels like since then she has been battling some upper respiratory symptoms. She endorses nasal congestion, rhinorrhea, fever of approximately 100 Fahrenheit, less appetite, mild headache and cough. Patient does have a history of pneumonia in the past. She is up-to-date on her immunizations. Patient denies any nausea, vomit, diarrhea or rash. She has a decreased appetite but tolerating by mouth.  She has only tried Tylenol/Advil for symptomatic relief.  Former Smoker  Past Medical History  Diagnosis Date  . Chicken pox as a child  . GERD (gastroesophageal reflux disease)   . Anemia     iron deficiency  . Hyperlipidemia   . Allergy   . 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 H/O measles   . H/O mumps   . Female bladder prolapse 09/13/2011  . Vaginitis 09/13/2011  . Dysuria 09/13/2011  . Ovarian cyst 09/14/2011  . Fibrocystic breast 09/14/2011  . 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 Osteoporosis     reclast in Oct  . Osteoarthrosis     right shoulder  . Uterine fibroid 11/2012    See Dr. Cletis Media note, surgery 11/2012   Allergies  Allergen Reactions  . Penicillins Rash  . Sulfa Antibiotics Rash    Under arm and thighs   Review of Systems Negative, with the exception of above mentioned in HPI     Objective:   Physical Exam BP 90/64 mmHg  Pulse 89  Temp(Src) 98.3 F (36.8 C)  Resp 20  Wt 107 lb 8 oz (48.762 kg)  SpO2 95% Gen: Afebrile. No acute distress. Nontoxic in appearance, well-developed, well-nourished, Asian female. Pleasant. HENT: AT. [place]. Bilateral TM visualized, no erythema or bulging. Fluid levels appreciated. MMM, no oral lesions. Bilateral nares with erythema and swelling. Throat with mild erythema, no exudates. Cough  present on exam, no hoarseness present on exam. Tenderness to palpation bilateral maxillary sinuses. Eyes:Pupils Equal Round Reactive to light, Extraocular movements intact,  Conjunctiva without redness, discharge or icterus. Neck/lymp/endocrine: Supple, left shotty anterior cervical lymphadenopathy CV: RRR  Chest: CTAB, no wheeze or crackles Abd: Soft. Height. NTND. BS present. No Masses palpated. Skin: No rashes, purpura or petechiae.     Assessment & Plan:  1. Acute maxillary sinusitis, recurrence not specified - Discussed rest, hydration, Flonase, Mucinex, nasal saline and doxycycline. Encouraged use of humidifier at night. - doxycycline (VIBRA-TABS) 100 MG tablet; Take 1 tablet (100 mg total) by mouth 2 (two) times daily.  Dispense: 20 tablet; Refill: 0 Follow-up as needed

## 2015-07-05 NOTE — Patient Instructions (Signed)
Sinusitis, Adult Sinusitis is redness, soreness, and inflammation of the paranasal sinuses. Paranasal sinuses are air pockets within the bones of your face. They are located beneath your eyes, in the middle of your forehead, and above your eyes. In healthy paranasal sinuses, mucus is able to drain out, and air is able to circulate through them by way of your nose. However, when your paranasal sinuses are inflamed, mucus and air can become trapped. This can allow bacteria and other germs to grow and cause infection. Sinusitis can develop quickly and last only a short time (acute) or continue over a long period (chronic). Sinusitis that lasts for more than 12 weeks is considered chronic. CAUSES Causes of sinusitis include:  Allergies.  Structural abnormalities, such as displacement of the cartilage that separates your nostrils (deviated septum), which can decrease the air flow through your nose and sinuses and affect sinus drainage.  Functional abnormalities, such as when the small hairs (cilia) that line your sinuses and help remove mucus do not work properly or are not present. SIGNS AND SYMPTOMS Symptoms of acute and chronic sinusitis are the same. The primary symptoms are pain and pressure around the affected sinuses. Other symptoms include:  Upper toothache.  Earache.  Headache.  Bad breath.  Decreased sense of smell and taste.  A cough, which worsens when you are lying flat.  Fatigue.  Fever.  Thick drainage from your nose, which often is green and may contain pus (purulent).  Swelling and warmth over the affected sinuses. DIAGNOSIS Your health care provider will perform a physical exam. During your exam, your health care provider may perform any of the following to help determine if you have acute sinusitis or chronic sinusitis:  Look in your nose for signs of abnormal growths in your nostrils (nasal polyps).  Tap over the affected sinus to check for signs of  infection.  View the inside of your sinuses using an imaging device that has a light attached (endoscope). If your health care provider suspects that you have chronic sinusitis, one or more of the following tests may be recommended:  Allergy tests.  Nasal culture. A sample of mucus is taken from your nose, sent to a lab, and screened for bacteria.  Nasal cytology. A sample of mucus is taken from your nose and examined by your health care provider to determine if your sinusitis is related to an allergy. TREATMENT Most cases of acute sinusitis are related to a viral infection and will resolve on their own within 10 days. Sometimes, medicines are prescribed to help relieve symptoms of both acute and chronic sinusitis. These may include pain medicines, decongestants, nasal steroid sprays, or saline sprays. However, for sinusitis related to a bacterial infection, your health care provider will prescribe antibiotic medicines. These are medicines that will help kill the bacteria causing the infection. Rarely, sinusitis is caused by a fungal infection. In these cases, your health care provider will prescribe antifungal medicine. For some cases of chronic sinusitis, surgery is needed. Generally, these are cases in which sinusitis recurs more than 3 times per year, despite other treatments. HOME CARE INSTRUCTIONS  Drink plenty of water. Water helps thin the mucus so your sinuses can drain more easily.  Use a humidifier.  Inhale steam 3-4 times a day (for example, sit in the bathroom with the shower running).  Apply a warm, moist washcloth to your face 3-4 times a day, or as directed by your health care provider.  Use saline nasal sprays to help   moisten and clean your sinuses.  Take medicines only as directed by your health care provider.  If you were prescribed either an antibiotic or antifungal medicine, finish it all even if you start to feel better. SEEK IMMEDIATE MEDICAL CARE IF:  You have  increasing pain or severe headaches.  You have nausea, vomiting, or drowsiness.  You have swelling around your face.  You have vision problems.  You have a stiff neck.  You have difficulty breathing.   This information is not intended to replace advice given to you by your health care provider. Make sure you discuss any questions you have with your health care provider.   Document Released: 06/25/2005 Document Revised: 07/16/2014 Document Reviewed: 07/10/2011 Elsevier Interactive Patient Education 2016 [place], hydrate, doxycycline for ten days, humidifier. Try nasal saline and flonase as well.

## 2015-07-06 ENCOUNTER — Encounter: Payer: Self-pay | Admitting: *Deleted

## 2015-07-10 DIAGNOSIS — A770 Spotted fever due to Rickettsia rickettsii: Secondary | ICD-10-CM

## 2015-07-10 HISTORY — DX: Spotted fever due to Rickettsia rickettsii: A77.0

## 2015-08-24 ENCOUNTER — Telehealth: Payer: Self-pay | Admitting: Family Medicine

## 2015-08-24 NOTE — Telephone Encounter (Signed)
Patient has scheduled proalia injection for Friday. She would like to know if there are any side effects?

## 2015-08-25 NOTE — Telephone Encounter (Signed)
Left message for patient side effects information left at front desk for patient review

## 2015-08-26 ENCOUNTER — Ambulatory Visit (INDEPENDENT_AMBULATORY_CARE_PROVIDER_SITE_OTHER): Payer: Medicare Other | Admitting: *Deleted

## 2015-08-26 DIAGNOSIS — M810 Age-related osteoporosis without current pathological fracture: Secondary | ICD-10-CM

## 2015-08-26 MED ORDER — DENOSUMAB 60 MG/ML ~~LOC~~ SOLN
60.0000 mg | Freq: Once | SUBCUTANEOUS | Status: AC
  Administered 2015-08-26: 60 mg via SUBCUTANEOUS

## 2015-08-26 NOTE — Progress Notes (Signed)
Patient presents today for her first Prolia injection. Medication information reviewed with patient. Patient tolerated injection well. No reaction at injection site after 20 minutes.

## 2015-09-19 ENCOUNTER — Other Ambulatory Visit: Payer: Self-pay | Admitting: *Deleted

## 2015-09-19 MED ORDER — OMEPRAZOLE 20 MG PO CPDR: 20.0000 mg | DELAYED_RELEASE_CAPSULE | Freq: Every day | ORAL | Status: DC

## 2015-09-21 ENCOUNTER — Telehealth: Payer: Self-pay | Admitting: Family Medicine

## 2015-09-21 MED ORDER — OMEPRAZOLE 20 MG PO CPDR: 20.0000 mg | DELAYED_RELEASE_CAPSULE | Freq: Every day | ORAL | Status: DC

## 2015-09-21 NOTE — Telephone Encounter (Signed)
Called patient and informed have not received request for prior auth for omeprazole, and advised it could be bought OTC. Patient states she is not requesting prior auth, just wanted eRx sent to [facility] rather than [facility].  Rerouted eRx to [facility].

## 2015-09-21 NOTE — Telephone Encounter (Signed)
Pt states that prior auth for omeprazole before it can be filled.

## 2015-11-01 ENCOUNTER — Encounter: Payer: Self-pay | Admitting: Family Medicine

## 2015-11-01 ENCOUNTER — Ambulatory Visit: Payer: Medicare Other | Admitting: Family Medicine

## 2015-11-01 ENCOUNTER — Ambulatory Visit (INDEPENDENT_AMBULATORY_CARE_PROVIDER_SITE_OTHER): Payer: Medicare Other | Admitting: Family Medicine

## 2015-11-01 VITALS — BP 106/65 | HR 81 | Temp 98.2°F | Resp 20 | Wt 109.5 lb

## 2015-11-01 DIAGNOSIS — T148 Other injury of unspecified body region: Secondary | ICD-10-CM

## 2015-11-01 DIAGNOSIS — R21 Rash and other nonspecific skin eruption: Secondary | ICD-10-CM | POA: Diagnosis not present

## 2015-11-01 DIAGNOSIS — W57XXXA Bitten or stung by nonvenomous insect and other nonvenomous arthropods, initial encounter: Secondary | ICD-10-CM | POA: Diagnosis not present

## 2015-11-01 MED ORDER — DOXYCYCLINE HYCLATE 100 MG PO TABS: 100.0000 mg | ORAL_TABLET | Freq: Two times a day (BID) | ORAL | Status: DC

## 2015-11-01 NOTE — Patient Instructions (Signed)
[place] Spotted Fever [place] spotted fever is an illness that is spread to people by infected ticks. The illness causes flulike symptoms and a reddish-purple rash. This illness can quickly become very serious. Treatment must be started right away. When the illness is not treated right away, it can sometimes lead to long-term health problems or even death. This illness is most common during warm weather when ticks are most active. CAUSES [place] spotted fever is caused by a type of bacteria that is called Rickettsia rickettsii. This type of bacteria is carried by American dog ticks and [place] wood ticks. People get infected through a bite from a tick that is infected with the bacteria. The bite is painless, and it frequently goes unnoticed. The bacteria can also infect a person when tick blood or tick feces get into a person's body through damaged skin. A tick bite is not necessary for an infection to occur. People can get [place] spotted fever if they get a tick's blood or body fluids on their skin in the area of a small cut or sore. This could happen while removing a tick from another person or a dog. The infection is not contagious, and it cannot be spread (transmitted) from person to person. SIGNS AND SYMPTOMS Symptoms may begin 2-14 days after a tick bite. The most common early symptoms are:  Fever.  Muscle aches.  Headache.  Nausea.  Vomiting.  Poor appetite.  Abdominal pain. The reddish-purple rash usually appears 3-5 days after the first symptoms begin. The rash often starts on the wrists and ankles. It may then spread to the palms, the soles of the feet, the legs, and the trunk. DIAGNOSIS Diagnosis is based on a physical exam, medical history, and blood tests. Your health care provider may suspect [place] spotted fever in one of these cases:   If you have recently been bitten by a tick.  If you have been in areas that have a lot of ticks  or in areas where the disease is common. TREATMENT It is important to begin treatment right away. Treatment will usually involve the use of antibiotic medicines. In some cases, your health care provider may begin treatment before the diagnosis is confirmed. If your symptoms are severe, a hospital stay may be needed. HOME CARE INSTRUCTIONS  Rest as much as possible until you feel better.  Take medicines only as directed by your health care provider.  Take your antibiotic medicine as directed by your health care provider. Finish the antibiotic even if you start to feel better.  Drink enough fluid to keep your urine clear or pale yellow.  Keep all follow-up visits as directed by your health care provider. This is important. PREVENTION Avoiding tick bites can help to prevent this illness. Take these steps to avoid tick bites when you are outdoors:  Be aware that most ticks live in shrubs, low tree branches, and grassy areas. A tick can climb onto your body when you make contact with leaves or grass where the tick is waiting.  Wear protective clothing. Long sleeves and long pants are best.  Wear white clothes so you can see ticks more easily.  Tuck your pant legs into your socks.  If you go walking on a trail, stay in the middle of the trail to avoid brushing against bushes.  Avoid walking through areas that have long grass.  Put insect repellent on all exposed skin and along boot tops, pant legs, and sleeve cuffs.    Check clothing, hair, and skin repeatedly and before going inside.  Check family members and pets for ticks.  Brush off any ticks that are not attached.  Take a shower or a bath as soon as possible after you have been outdoors. Check your skin for ticks. The most common places on the body where ticks attach themselves are the scalp, neck, armpits, waist, and groin. You can also greatly reduce your chances of getting [place] spotted fever if you remove attached  ticks as soon as possible. To remove an attached tick, use a forceps or fine-point tweezers to detach the intact tick without leaving its mouth parts in the skin. The wound from the tick bite should be washed after the tick has been removed. SEEK MEDICAL CARE IF:  You have drainage, swelling, or increased redness or pain in the area of the rash. SEEK IMMEDIATE MEDICAL CARE IF:  You have chest pain.  You have shortness of breath.  You have a severe headache.  You have a seizure.  You have severe abdominal pain.  You are feeling confused.  You are bruising easily.  You have bleeding from your gums.  You have blood in your stool.   This information is not intended to replace advice given to you by your health care provider. Make sure you discuss any questions you have with your health care provider.   Document Released: 10/07/2000 Document Revised: 07/16/2014 Document Reviewed: 02/08/2014 Elsevier Interactive Patient Education 2016 [employer]  

## 2015-11-01 NOTE — Progress Notes (Signed)
Patient ID: Kelsey [name], female   DOB: [birth year], 70 y.o.   MRN: [id number]    Kelsey [name] , 1945-11-26, 70 y.o., female MRN: [id number]  CC: tick bite Subjective: Pt presents for an acute OV with complaints of tick bite x2 about 10 days ago. Associated symptoms include headache (not current), and bilateral wrist and bilateral ankle red rash. She reports the tics had to be on her body for a few days. She initially felt there was an itchiness, but did not look at the location until a few days later she pulled of a live tick that was "fat". She noticed the rash over the last few days.   Allergies  Allergen Reactions  . Penicillins Rash  . Sulfa Antibiotics Rash    Under arm and thighs   Social History  Substance Use Topics  . Smoking status: Former Smoker -- 0.30 packs/day for 10 years    Types: Cigarettes    Quit date: 07/09/1978  . Smokeless tobacco: Never Used  . Alcohol Use: Yes     Comment: 1-2 glasses of wine daily and a 12 oz beer daily   Past Medical History  Diagnosis Date  . Chicken pox as a child  . GERD (gastroesophageal reflux disease)   . Anemia     iron deficiency  . Hyperlipidemia   . Allergy   . 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 H/O measles   . H/O mumps   . Female bladder prolapse 09/13/2011  . Vaginitis 09/13/2011  . Dysuria 09/13/2011  . Ovarian cyst 09/14/2011  . Fibrocystic breast 09/14/2011  . 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 Osteoporosis     reclast in Oct  . Osteoarthrosis     right shoulder  . Uterine fibroid 11/2012    See Dr. Cletis Media note, surgery 11/2012   Past Surgical History  Procedure Laterality Date  . Small intestine hernia  1994  . Hernia repair  1992  . Dilatation & currettage/hysteroscopy with resectocope N/A 11/06/2012    Procedure: DILATATION & CURETTAGE/HYSTEROSCOPY WITH RESECTOCOPE;  Surgeon: Alwyn Pea, MD;  Location: [facility];  Service: Gynecology;  Laterality: N/A;   Family History  Problem Relation Age of Onset  . Heart  disease Mother   . Osteoporosis Mother   . Cancer Father     prostate  . Osteoporosis Sister   . Gout Brother   . Gout Maternal Grandmother   . Heart disease Maternal Grandmother   . Alzheimer's disease Maternal Grandfather   . Osteoporosis Sister      Medication List       This list is accurate as of: 11/01/15  3:44 PM.  Always use your most recent med list.               Biotin 10 MG Tabs  Take by mouth.     CALCIUM + D3 PO  Take 300 mg by mouth daily.     Co Q 10 100 MG Caps  Take 1 tablet by mouth daily.     denosumab 60 MG/ML Soln injection  Commonly known as:  PROLIA  Inject 60 mg into the skin once. Administer in upper arm, thigh, or abdomen     ESTRING 2 MG vaginal ring  Generic drug:  estradiol     Fish Oil 1000 MG Caps  Take 1 capsule by mouth daily.     Glucosamine 750 MG Tabs  Take by mouth daily.     LORazepam 0.5 MG tablet  Commonly  known as:  ATIVAN  Take 1 tablet (0.5 mg total) by mouth 2 (two) times daily as needed for anxiety.     MAGNESIA PO  Take 1 tablet by mouth daily. OTC     multivitamin capsule  Take 0.5 capsules by mouth daily. Reported on 07/05/2015     omeprazole 20 MG capsule  Commonly known as:  PRILOSEC  Take 1 capsule (20 mg total) by mouth daily.       ROS: Negative, with the exception of above mentioned in HPI Objective:  BP 106/65 mmHg  Pulse 81  Temp(Src) 98.2 F (36.8 C) (Oral)  Resp 20  Wt 109 lb 8 oz (49.669 kg)  SpO2 97% Body mass index is 18.22 kg/(m^2). Gen: Afebrile. No acute distress. Nontoxic in appearance, well developed, well nourished thin pt.  HENT: AT. [place]. Bilateral TM visualized and normal in appearance. MMM, no oral lesions. Bilateral nares without erythema or swelling. Throat without erythema or exudates.  Eyes:Pupils Equal Round Reactive to light, Extraocular movements intact,  Conjunctiva without redness, discharge or icterus. Neck/lymp/endocrine: Supple,no lymphadenopathy Skin:   maculopapular rash bilateral ankles and bilateral wrist. Insect bite inner left thigh x2. No erythema, swelling or drainage associated with bite. No other rash.  Neuro: Normal gait. PERLA. EOMi. Alert. Oriented x3  Psych: Normal affect, dress and demeanor. Normal speech. Normal thought content and judgment  Assessment/Plan: Kelsey [name] is a 70 y.o. female present for acute OV for  Tick bite with Rash and nonspecific skin eruption - Pt with initial headache, tick bite and no rash bilateral ankles and wrist. - Discussed with patient the possibility of RMSF as cause for all the above. She is not certain if she agrees or feels these were all separate issues, which of course is a possibility, but agrees to treatment today. Discussed lab work, inaccuracy and timing of labs to be positive with pt today.  - doxycycline (VIBRA-TABS) 100 MG tablet; Take 1 tablet (100 mg total) by mouth 2 (two) times daily.  Dispense: 14 tablet; Refill: 0   electronically signed by:  Howard Pouch, DO  [place]

## 2015-11-03 ENCOUNTER — Telehealth: Payer: Self-pay | Admitting: *Deleted

## 2015-11-03 ENCOUNTER — Encounter: Payer: Self-pay | Admitting: Family Medicine

## 2015-11-03 ENCOUNTER — Ambulatory Visit (INDEPENDENT_AMBULATORY_CARE_PROVIDER_SITE_OTHER): Payer: Medicare Other | Admitting: Family Medicine

## 2015-11-03 DIAGNOSIS — R21 Rash and other nonspecific skin eruption: Secondary | ICD-10-CM | POA: Diagnosis not present

## 2015-11-03 DIAGNOSIS — Z01419 Encounter for gynecological examination (general) (routine) without abnormal findings: Secondary | ICD-10-CM | POA: Diagnosis not present

## 2015-11-03 DIAGNOSIS — Z681 Body mass index (BMI) 19 or less, adult: Secondary | ICD-10-CM | POA: Diagnosis not present

## 2015-11-03 DIAGNOSIS — N952 Postmenopausal atrophic vaginitis: Secondary | ICD-10-CM | POA: Diagnosis not present

## 2015-11-03 DIAGNOSIS — Z124 Encounter for screening for malignant neoplasm of cervix: Secondary | ICD-10-CM | POA: Diagnosis not present

## 2015-11-03 DIAGNOSIS — M810 Age-related osteoporosis without current pathological fracture: Secondary | ICD-10-CM | POA: Diagnosis not present

## 2015-11-03 MED ORDER — TRIAMCINOLONE ACETONIDE 0.1 % EX CREA: 1.0000 "application " | TOPICAL_CREAM | Freq: Two times a day (BID) | CUTANEOUS | Status: DC

## 2015-11-03 MED ORDER — HYDROXYZINE HCL 10 MG PO TABS: 10.0000 mg | ORAL_TABLET | Freq: Three times a day (TID) | ORAL | Status: DC | PRN

## 2015-11-03 NOTE — Telephone Encounter (Signed)
Patient scheduled office visit to be seen today.

## 2015-11-03 NOTE — Telephone Encounter (Signed)
Patient called left message stating she is itching all over and its driving her crazy . She wants to know if there is anything she can take for this? Please advise.

## 2015-11-03 NOTE — Patient Instructions (Signed)
1. Buy Allegra or Zyrtec and start now. (1 only in 24 hours) 2. Buy benadryl and take a dose tonight before bed.  3. If neither of the above work, you can fill the prescription I handed to you today, and try it as prescribed. Do not take prescribed pill with the benadryl, but you can take with Allegra or Zyrtec.  4. Cream can be used with all the above.

## 2015-11-03 NOTE — Telephone Encounter (Signed)
Left message for patient with instructions on voice mail.

## 2015-11-03 NOTE — Progress Notes (Signed)
Patient ID: Kelsey [name], female   DOB: 1945/12/27, 70 y.o.   MRN: [id number]    Kelsey [name] , Apr 04, 1946, 70 y.o., female MRN: [id number]  CC: rash Subjective:  Rash: pt presents today with a rash that is spreading. She had a tick bite and is being treated for potential RMSF with doxy which she started yesterday (after itching progressed). She states she feels that she did have a mild fever yesterday or "hot flash" and one episode of nausea. She reports after the hot flash, she decided she would start the doxycyline. Today her rash is worse, and is on her back and sacrum, where she was itchy yesterday but prior to expression of rest or start of doxycyline. She states the rash is driving her crazy and is more itchy and spreading. Initially the rash was wrists and ankles only. She reports she has been back out in the yard again as well.   Prior Note: Pt presents for an acute OV with complaints of tick bite x2 about 10 days ago. Associated symptoms include headache (not current), and bilateral wrist and bilateral ankle red rash. She reports the tics had to be on her body for a few days. She initially felt there was an itchiness, but did not look at the location until a few days later she pulled of a live tick that was "fat". She noticed the rash over the last few days.   Allergies  Allergen Reactions  . Penicillins Rash  . Sulfa Antibiotics Rash    Under arm and thighs   Social History  Substance Use Topics  . Smoking status: Former Smoker -- 0.30 packs/day for 10 years    Types: Cigarettes    Quit date: 07/09/1978  . Smokeless tobacco: Never Used  . Alcohol Use: Yes     Comment: 1-2 glasses of wine daily and a 12 oz beer daily   Past Medical History  Diagnosis Date  . Chicken pox as a child  . GERD (gastroesophageal reflux disease)   . Anemia     iron deficiency  . Hyperlipidemia   . Allergy   . 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 H/O measles   . H/O mumps   . Female  bladder prolapse 09/13/2011  . Vaginitis 09/13/2011  . Dysuria 09/13/2011  . Ovarian cyst 09/14/2011  . Fibrocystic breast 09/14/2011  . 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 Osteoporosis     reclast in Oct  . Osteoarthrosis     right shoulder  . Uterine fibroid 11/2012    See Dr. Cletis Media note, surgery 11/2012   Past Surgical History  Procedure Laterality Date  . Small intestine hernia  1994  . Hernia repair  1992  . Dilatation & currettage/hysteroscopy with resectocope N/A 11/06/2012    Procedure: DILATATION & CURETTAGE/HYSTEROSCOPY WITH RESECTOCOPE;  Surgeon: Alwyn Pea, MD;  Location: [facility];  Service: Gynecology;  Laterality: N/A;   Family History  Problem Relation Age of Onset  . Heart disease Mother   . Osteoporosis Mother   . Cancer Father     prostate  . Osteoporosis Sister   . Gout Brother   . Gout Maternal Grandmother   . Heart disease Maternal Grandmother   . Alzheimer's disease Maternal Grandfather   . Osteoporosis Sister      Medication List       This list is accurate as of: 11/03/15 11:00 AM.  Always use your most recent med list.  Biotin 10 MG Tabs  Take by mouth.     CALCIUM + D3 PO  Take 300 mg by mouth daily.     Co Q 10 100 MG Caps  Take 1 tablet by mouth daily.     denosumab 60 MG/ML Soln injection  Commonly known as:  PROLIA  Inject 60 mg into the skin once. Administer in upper arm, thigh, or abdomen     doxycycline 100 MG tablet  Commonly known as:  VIBRA-TABS  Take 1 tablet (100 mg total) by mouth 2 (two) times daily.     ESTRING 2 MG vaginal ring  Generic drug:  estradiol     Fish Oil 1000 MG Caps  Take 1 capsule by mouth daily.     Glucosamine 750 MG Tabs  Take by mouth daily.     LORazepam 0.5 MG tablet  Commonly known as:  ATIVAN  Take 1 tablet (0.5 mg total) by mouth 2 (two) times daily as needed for anxiety.     MAGNESIA PO  Take 1 tablet by mouth daily. OTC     multivitamin capsule  Take 0.5 capsules by mouth  daily. Reported on 07/05/2015     omeprazole 20 MG capsule  Commonly known as:  PRILOSEC  Take 1 capsule (20 mg total) by mouth daily.       ROS: Negative, with the exception of above mentioned in HPI Objective:  BP 108/74 mmHg  Pulse 77  Temp(Src) 98.1 F (36.7 C) (Oral)  Resp 20  Wt 109 lb 8 oz (49.669 kg)  SpO2 97% Body mass index is 18.22 kg/(m^2). Gen: Afebrile. No acute distress. Nontoxic in appearance, well developed, well nourished thin pt.  HENT: AT. [place]. Bilateral TM visualized and normal in appearance. MMM, no oral lesions.  Eyes:Pupils Equal Round Reactive to light, Extraocular movements intact,  Conjunctiva without redness, discharge or icterus. Skin:  maculopapular rash bilateral ankles and bilateral wrist, improved today. erythemic red raised rash  Left flank, left tricep, over sacrum.  Insect bite inner left thigh x2. No erythema, swelling or drainage associated with bite.  Neuro: Normal gait. PERLA. EOMi. Alert. Oriented x3  Psych: Normal affect, dress and demeanor. Normal speech. Normal thought content and judgment  Assessment/Plan: Kelsey [name] is a 70 y.o. female present for acute OV for  Tick bite with Rash and nonspecific skin eruption - Pt with initial headache, tick bite and  rash bilateral ankles and wrist. Considering RMSF, timeline is appropriate and so was initial appearance of rash.  - Pt has started the abx yesterday. She experienced nausea x 1. Discussed it could be secondary to doxy, discussed this with her today.  - continue doxy, pt has tolerated this medication in the past.  - Rash is spreading vs hives vs additional insect bites. I do not think it secondary to doxy because she was scratching in the same area at first appt prior to start of doxy. She has been again working out in the yard, could be secondary exposure if hives or insect bites. - Pt to purchase allegra or zyrtec and start today. - Purchase benadryl and if need take LOW dose  tonight before bed, sedation precautions given to pt.  - triamcinolone cream prescribed for topical relief.  - Vistaril 10 mg TID PRN ONLY if all the above is not working and NEVER with benadryl. Sedation precautions given to patient.  - F/U if worsening.   > 25 minutes spent with patient, >50% of time spent  face to face counseling patient and coordinating care.   electronically signed by:  Howard Pouch, DO  [place]

## 2015-11-03 NOTE — Telephone Encounter (Signed)
Pt can try OTC regimens:  - Oral medications: allegra or benadryl.  - Topical cortisone. - If needing something more I can prescribe a stronger steroid cream and/or antihistamine. A prescribed antihistamine would probably cause her sedation, but could be used at least at night. I would want her to try OTC therapy today and if needing more she can call in tomorrow morning and we can prescribe something more for her.

## 2015-11-09 ENCOUNTER — Telehealth: Payer: Self-pay | Admitting: *Deleted

## 2015-11-10 ENCOUNTER — Encounter: Payer: Self-pay | Admitting: Family Medicine

## 2015-11-10 ENCOUNTER — Ambulatory Visit (INDEPENDENT_AMBULATORY_CARE_PROVIDER_SITE_OTHER): Payer: Medicare Other | Admitting: Family Medicine

## 2015-11-10 VITALS — BP 97/64 | HR 70 | Temp 98.0°F | Resp 16 | Wt 108.0 lb

## 2015-11-10 DIAGNOSIS — L299 Pruritus, unspecified: Secondary | ICD-10-CM | POA: Diagnosis not present

## 2015-11-10 DIAGNOSIS — R21 Rash and other nonspecific skin eruption: Secondary | ICD-10-CM | POA: Diagnosis not present

## 2015-11-10 NOTE — Progress Notes (Signed)
OFFICE VISIT  11/10/2015   CC:  Chief Complaint  Patient presents with  . Rash    only occurs in the morning, then goes away     HPI:    Patient is a 70 y.o. Asian female who presents for f/u of a rash. Was seen 11/03/15 by Dr. [name] for same reason and I reviewed that note today.    Hx of tick bite. Two weeks later she got rash on ankles and wrists that was slightly raised and pink--it itched some.  It reminded her of poison ivy, which she had potentially had contact with.  Seen by MD and doxy started for potential RMSF.  At that time the only other symptom she had was an isolated hot flash.  Before starting the doxy, she started to get an intermittent rash on torso that itched a lot.   Currently she denies any HAs, body aches, fevers.  She has finished the doxy. She says the rash on ankles and wrists is gone.  She continues to itch on her torso and continues to have a pinkish rash in the itchy areas that comes up only briefly.  An antihistamine she has at home is helping with the rash/itch. She is worried she may still have " a seed" of the infection in her.    Past Medical History  Diagnosis Date  . Chicken pox as a child  . GERD (gastroesophageal reflux disease)   . Anemia     iron deficiency  . Hyperlipidemia   . Allergy   . 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 H/O measles   . H/O mumps   . Female bladder prolapse 09/13/2011  . Vaginitis 09/13/2011  . Dysuria 09/13/2011  . Ovarian cyst 09/14/2011  . Fibrocystic breast 09/14/2011  . 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 Osteoporosis     reclast in Oct  . Osteoarthrosis     right shoulder  . Uterine fibroid 11/2012    See Dr. Cletis Media note, surgery 11/2012    Past Surgical History  Procedure Laterality Date  . Small intestine hernia  1994  . Hernia repair  1992  . Dilatation & currettage/hysteroscopy with resectocope N/A 11/06/2012    Procedure: DILATATION & CURETTAGE/HYSTEROSCOPY WITH RESECTOCOPE;  Surgeon: Alwyn Pea, MD;   Location: [facility];  Service: Gynecology;  Laterality: N/A;    Outpatient Prescriptions Prior to Visit  Medication Sig Dispense Refill  . Biotin 10 MG TABS Take by mouth.    . Calcium Carb-Cholecalciferol (CALCIUM + D3 PO) Take 300 mg by mouth daily.    . Coenzyme Q10 (CO Q 10) 100 MG CAPS Take 1 tablet by mouth daily.    Marland Kitchen denosumab (PROLIA) 60 MG/ML SOLN injection Inject 60 mg into the skin once. Administer in upper arm, thigh, or abdomen 180 mL 1  . ESTRING 2 MG vaginal ring     . Glucosamine 750 MG TABS Take by mouth daily.     . hydrOXYzine (ATARAX/VISTARIL) 10 MG tablet Take 1 tablet (10 mg total) by mouth 3 (three) times daily as needed. 30 tablet 0  . LORazepam (ATIVAN) 0.5 MG tablet Take 1 tablet (0.5 mg total) by mouth 2 (two) times daily as needed for anxiety. 30 tablet 1  . Magnesium Hydroxide (MAGNESIA PO) Take 1 tablet by mouth daily. OTC    . Multiple Vitamin (MULTIVITAMIN) capsule Take 0.5 capsules by mouth daily. Reported on 07/05/2015    . Omega-3 Fatty Acids (FISH OIL) 1000 MG CAPS Take 1 capsule  by mouth daily.    Marland Kitchen omeprazole (PRILOSEC) 20 MG capsule Take 1 capsule (20 mg total) by mouth daily. 90 capsule 1  . doxycycline (VIBRA-TABS) 100 MG tablet Take 1 tablet (100 mg total) by mouth 2 (two) times daily. (Patient not taking: Reported on 11/10/2015) 14 tablet 0  . triamcinolone cream (KENALOG) 0.1 % Apply 1 application topically 2 (two) times daily. Apply to skin rash as needed (Patient not taking: Reported on 11/10/2015) 30 g 0   No facility-administered medications prior to visit.    Allergies  Allergen Reactions  . Penicillins Rash  . Sulfa Antibiotics Rash    Under arm and thighs    ROS As per HPI  PE: Blood pressure 97/64, pulse 70, temperature 98 F (36.7 C), temperature source Oral, resp. rate 16, weight 108 lb (48.988 kg), SpO2 94 %.  Pt examined with Sharen Hones, CMA, as chaperone.  Gen: Alert, well appearing.  Patient is oriented to person, place,  time, and situation. AFFECT: pleasant, lucid thought and speech. SKIN: no rash on any skin surface.  No skin lesions.  No discoloration.  LABS:  none  IMPRESSION AND PLAN:  Intermittent itch/rash on trunk, lingering after treatment for possible RMSF.   Reassured patient.  I don't think she still has any infection.  I told her the rash may linger this way for a while and to continue to take antihistamine prn. Signs/symptoms to call or return for were reviewed and pt expressed understanding.  An After Visit Summary was printed and given to the patient.  Spent 25 min with pt today, with >50% of this time spent in counseling and care coordination regarding the above problems.  FOLLOW UP: Return if symptoms worsen or fail to improve.  Signed:  Crissie Sickles, MD           11/10/2015

## 2015-11-10 NOTE — Telephone Encounter (Signed)
Opened in error

## 2015-11-16 ENCOUNTER — Encounter: Payer: Self-pay | Admitting: Family Medicine

## 2015-11-16 DIAGNOSIS — A770 Spotted fever due to Rickettsia rickettsii: Secondary | ICD-10-CM | POA: Insufficient documentation

## 2015-11-16 DIAGNOSIS — Z79890 Hormone replacement therapy (postmenopausal): Secondary | ICD-10-CM | POA: Insufficient documentation

## 2015-12-08 ENCOUNTER — Ambulatory Visit (INDEPENDENT_AMBULATORY_CARE_PROVIDER_SITE_OTHER): Payer: Medicare Other | Admitting: Family Medicine

## 2015-12-08 ENCOUNTER — Encounter: Payer: Self-pay | Admitting: Family Medicine

## 2015-12-08 VITALS — BP 88/59 | HR 68 | Temp 98.3°F | Resp 18 | Ht 65.0 in | Wt 109.5 lb

## 2015-12-08 DIAGNOSIS — Z862 Personal history of diseases of the blood and blood-forming organs and certain disorders involving the immune mechanism: Secondary | ICD-10-CM

## 2015-12-08 DIAGNOSIS — Z23 Encounter for immunization: Secondary | ICD-10-CM | POA: Diagnosis not present

## 2015-12-08 DIAGNOSIS — Z0000 Encounter for general adult medical examination without abnormal findings: Secondary | ICD-10-CM | POA: Diagnosis not present

## 2015-12-08 DIAGNOSIS — E789 Disorder of lipoprotein metabolism, unspecified: Secondary | ICD-10-CM

## 2015-12-08 LAB — CBC WITH DIFFERENTIAL/PLATELET
BASOS PCT: 0.5 % (ref 0.0–3.0)
Basophils Absolute: 0 10*3/uL (ref 0.0–0.1)
EOS ABS: 0.1 10*3/uL (ref 0.0–0.7)
EOS PCT: 2.6 % (ref 0.0–5.0)
HCT: 36.8 % (ref 36.0–46.0)
Hemoglobin: 12.2 g/dL (ref 12.0–15.0)
LYMPHS ABS: 1.4 10*3/uL (ref 0.7–4.0)
LYMPHS PCT: 34.9 % (ref 12.0–46.0)
MCHC: 33.2 g/dL (ref 30.0–36.0)
MCV: 94.3 fl (ref 78.0–100.0)
MONO ABS: 0.3 10*3/uL (ref 0.1–1.0)
MONOS PCT: 6.3 % (ref 3.0–12.0)
NEUTROS ABS: 2.2 10*3/uL (ref 1.4–7.7)
Neutrophils Relative %: 55.7 % (ref 43.0–77.0)
PLATELETS: 263 10*3/uL (ref 150.0–400.0)
RBC: 3.9 Mil/uL (ref 3.87–5.11)
RDW: 13.9 % (ref 11.5–15.5)
WBC: 4 10*3/uL (ref 4.0–10.5)

## 2015-12-08 LAB — COMPREHENSIVE METABOLIC PANEL
ALBUMIN: 4.3 g/dL (ref 3.5–5.2)
ALT: 18 U/L (ref 0–35)
AST: 25 U/L (ref 0–37)
Alkaline Phosphatase: 49 U/L (ref 39–117)
BILIRUBIN TOTAL: 0.5 mg/dL (ref 0.2–1.2)
BUN: 18 mg/dL (ref 6–23)
CALCIUM: 8.6 mg/dL (ref 8.4–10.5)
CHLORIDE: 105 meq/L (ref 96–112)
CO2: 29 meq/L (ref 19–32)
CREATININE: 0.58 mg/dL (ref 0.40–1.20)
GFR: 109.2 mL/min (ref >60.00)
Glucose, Bld: 84 mg/dL (ref 70–99)
Potassium: 4.3 mEq/L (ref 3.5–5.1)
SODIUM: 139 meq/L (ref 135–145)
Total Protein: 6.8 g/dL (ref 6.0–8.3)

## 2015-12-08 LAB — LIPID PANEL
CHOLESTEROL: 262 mg/dL — AB (ref 0–200)
HDL: 98.2 mg/dL (ref >39.00)
LDL CALC: 152 mg/dL — AB (ref 0–99)
NonHDL: 163.97
TRIGLYCERIDES: 60 mg/dL (ref 0.0–149.0)
Total CHOL/HDL Ratio: 3
VLDL: 12 mg/dL (ref 0.0–40.0)

## 2015-12-08 MED ORDER — ZOSTER VACCINE LIVE 19400 UNT/0.65ML ~~LOC~~ SUSR: 0.6500 mL | Freq: Once | SUBCUTANEOUS | Status: DC

## 2015-12-08 NOTE — Progress Notes (Addendum)
Patient ID: Kelsey [name], female   DOB: 1946/01/06, 70 y.o.   MRN: [id number] Medicare AWV    History of Present Ilness: Kelsey [name], 70 y.o. , female presents today for Medicare wellness visit with physical (secondary insurance [employer]).  History of present illness: Patient has no complaints today. Vital Signs: BP 88/59 mmHg  Pulse 68  Temp(Src) 98.3 F (36.8 C)  Resp 18  Ht 5\' 5"  (1.651 m)  Wt 109 lb 8 oz (49.669 kg)  BMI 18.22 kg/m2  SpO2 97% List of providers/suppliers:  Updated in pts records (snapshot) Patient Care Team    Relationship Specialty Notifications Start End  [name], DO PCP - General Family Medicine  05/27/15   Delsa Bern, MD Consulting Physician Obstetrics and Gynecology  12/08/15   [name], MD Consulting Physician Gastroenterology  12/08/15   Calvert Cantor, MD Consulting Physician Ophthalmology  12/08/15     Past medical, surgical, family and social histories reviewed (including experiences with illnesses, hospital stays, operations, injuries, and treatments):  Past Medical History  Diagnosis Date  . Chicken pox as a child  . GERD (gastroesophageal reflux disease)   . Anemia     iron deficiency  . Hyperlipidemia   . Allergy   . 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 H/O measles   . H/O mumps   . Female bladder prolapse 09/13/2011  . Vaginitis 09/13/2011  . Dysuria 09/13/2011  . Ovarian cyst 09/14/2011  . Fibrocystic breast 09/14/2011  . 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 Osteoporosis     reclast in Oct  . Osteoarthrosis     right shoulder  . Uterine fibroid 11/2012    See Dr. Cletis Media note, surgery 11/2012  . Bladder prolapse, female, acquired    All allergies reviewed Allergies  Allergen Reactions  . Penicillins Rash  . Sulfa Antibiotics Rash    Under arm and thighs   Past Surgical History  Procedure Laterality Date  . Small intestine hernia  1994  . Hernia repair  1992  . Dilatation & currettage/hysteroscopy with resectocope N/A 11/06/2012     Procedure: DILATATION & CURETTAGE/HYSTEROSCOPY WITH RESECTOCOPE;  Surgeon: Alwyn Pea, MD;  Location: [facility];  Service: Gynecology;  Laterality: N/A;  . Wisdom tooth extraction     Family History  Problem Relation Age of Onset  . Heart disease Mother   . Osteoporosis Mother   . Cancer Father     prostate  . Osteoporosis Sister   . Gout Brother   . Gout Maternal Grandmother   . Heart disease Maternal Grandmother   . Alzheimer's disease Maternal Grandfather   . Osteoporosis Sister    Social History   Social History Narrative    All medications verified   Medication List       This list is accurate as of: 12/08/15  9:01 AM.  Always use your most recent med list.               Biotin 10 MG Tabs  Take by mouth.     CALCIUM + D3 PO  Take 300 mg by mouth daily.     Co Q 10 100 MG Caps  Take 1 tablet by mouth daily.     denosumab 60 MG/ML Soln injection  Commonly known as:  PROLIA  Inject 60 mg into the skin once. Administer in upper arm, thigh, or abdomen     ESTRING 2 MG vaginal ring  Generic drug:  estradiol     Fish Oil  1000 MG Caps  Take 1 capsule by mouth daily.     Glucosamine 750 MG Tabs  Take by mouth daily.     MAGNESIA PO  Take 1 tablet by mouth daily. OTC     omeprazole 20 MG capsule  Commonly known as:  PRILOSEC  Take 1 capsule (20 mg total) by mouth daily.     Vitamin D-3 1000 units Caps  Take 4,000 Units by mouth.         Exercise/Diet: Current Exercise Habits: Structured exercise class, Type of exercise: yoga (swimming), Time (Minutes): 60, Frequency (Times/Week): 3, Weekly Exercise (Minutes/Week): 180, Intensity: Mild   Diet: Regular diet  Functional Status Survey: Is the patient deaf or have difficulty hearing?: No Does the patient have difficulty seeing, even when wearing glasses/contacts?: No Does the patient have difficulty concentrating, remembering, or making decisions?: No Does the patient have difficulty walking or  climbing stairs?: No Does the patient have difficulty dressing or bathing?: No Does the patient have difficulty doing errands alone such as visiting a doctor's office or shopping?: No  Fall Risk  12/08/2015 12/02/2013  Falls in the past year? No Yes  Number falls in past yr: - 1  Injury with Fall? - Yes  Risk for fall due to : - Other (Comment)  Risk for fall due to (comments): - Dog over balanced her     Cognitive assessment: no barriers identified   Hearing screen: No obvious barriers  Depression Screening: Depression screen [facility] 2/9 12/08/2015 12/02/2013  Decreased Interest 0 0  Down, Depressed, Hopeless 0 1  PHQ - 2 Score 0 1    Advanced Care Planning: Patient has advanced directives, and she will bring this in the copy into her chart.  Health maintenance:  Immunizations: Immunization History  Administered Date(s) Administered  . Hepatitis A, Adult 03/23/2014  . Influenza Whole 04/09/2011  . Influenza, High Dose Seasonal PF 05/27/2015  . Influenza,inj,Quad PF,36+ Mos 03/19/2014  . Pneumococcal Conjugate-13 12/02/2013, 04/02/2014  . Tdap 12/02/2013   Info -Pneumococcal (once in a lifetime after age 78); Seasonal Influenza annually; Tetanus ( Tdap ) once every 10 years- not covered by medicare; Zostavax - Shingles vaccine - not covered by Part A or B   Bone mass measurements Date of Study: 05/16/2015, osteoporosis, patient on prolia , repeat skin every 2 years Info:  USPSTF: "B" recommendation to screen, >2 yr interval advised. Coverage: Medicare patients at risk for developing Osteoporosis. Medicare covers every 24 months or based on previous test.   Cardiovascular Screening Blood Tests Lipid Panel     Component Value Date/Time   CHOL 262* 12/08/2015 0850   TRIG 60.0 12/08/2015 0850   HDL 98.20 12/08/2015 0850   CHOLHDL 3 12/08/2015 0850   VLDL 12.0 12/08/2015 0850   LDLCALC 152* 12/08/2015 0850   LDLDIRECT 131.6 11/28/2012 0951   Info:  USPSTF: "C"  recommendation for no screening unless patient has risk factors, "A" recommendation to screen if has risk factors: HTN, DM, ASCVD, Fam Hx of ASCVD in men <50, women <60, Tobacco use, BMI > 30. USPSTF prefers Total Cholesterol and HDL for screening. USPSTF advises every 5 years Coverage: All asymptomic Medicare patients (No fast required for total and HDL measurement. 12-hr fast is required if lipid panel done)  Diabetes Screening Tests CMP Latest Ref Rng 12/08/2015 12/07/2014 05/31/2014  Glucose 70 - 99 mg/dL 84 78 84  BUN 6 - 23 mg/dL 18 16 14   Creatinine 0.40 - 1.20 mg/dL 0.58 0.59  0.6  Sodium 135 - 145 mEq/L 139 138 139  Potassium 3.5 - 5.1 mEq/L 4.3 4.9 5.0  Chloride 96 - 112 mEq/L 105 103 101  CO2 19 - 32 mEq/L 29 29 26   Calcium 8.4 - 10.5 mg/dL 8.6 9.2 9.5  Total Protein 6.0 - 8.3 g/dL 6.8 7.1 7.1  Total Bilirubin 0.2 - 1.2 mg/dL 0.5 0.7 0.8  Alkaline Phos 39 - 117 U/L 49 62 69  AST 0 - 37 U/L 25 24 24   ALT 0 - 35 U/L 18 17 23    Info USPSTF: "B" recommendation to screen if patient has sustained BP > 135/80; ([employer] covers 2 screening tests per year for patient diagnosed with pre-diabetes; 1 screening per year if previously tested, but not diagnosed with pre-diabetes or if never tested) Coverage: - Medicare patients with certain risk factors for diabetes or diagnosed with pre-diabetes (patients previously diagnosed with diabetes aren't eligible for benefit)  Diabetes Self-Mgmt Training and Medical Nutrition Therapy  Date previously done: N/A Info: (Up to 10 hrs of initial training within a continuous 23mo period; subsequent yrs up to 2hrs follow-up training each year after initial year) ;  Coverage: Medicare patients at risk for complications from diabetes, recently diagnosed with diabetes or previously diagnosed with diabetes (must certify DSMT need)   Colorectal Cancer Screening Last Colonoscopy: 01/06/2014 Done by: Dr. Collene Mares. Follow-up in 10 year. Info USPSTF: "A" for CRC  screening, ages 81-74;  3 screening methods: USPSTF says no method is preferred - Annual high-sensitivity FOBT OR - Flex sig Q 5 y + annual FOBT OR - Colonoscopy Q 10 y;  Medicare covers:  -Flexible sigmoidoscopy (13yrs, or once every 10 yrs after a screening colonoscopy) -Screening Colonoscopy (every 5 yrs if at high risk; every 10 yrs otherwise) -Fecal Occult Blood Test (annually) or Cologuard q3 years Coverage: Medicare covers for patients age 25 and up - Screening colonoscopy: For those at high risk; no minimum age  Glaucoma Screening  Date of Last ophthalmology evaluation: Dr. Bing Plume, No Fhx. Last eye exam: scheduled this month. Info USPSTF: "I" recommendation - Insufficent evidence to recommend for or against screening (Medicare coveres annually for patients in a high risk group);  Coverage: Medicare covers for patients with diabetes mellitus, family history of glaucoma, African-Americans age 94 and over, or 38 age 64 and up  Screening Pap tests and Pelvic Exam Last Pap and Pelvic exam: Has gynecologist. Cervical cancer screening not indicated. Info:  USPSTF: "D" recommendation against cervical cancer screening for women > age 42 with adequate screening history not at high risk for cervical cancer.  (Medicare covers annually if high-risk, or childbearing age with abnormal Pap test within past 3 yrs; every 24 months for all other women); Medicare covers for all female Medicare patients  Screening Mammography Last mammogram 05/17/2015, BI-RADS 2, benign. Patient prefers every to use her mammogram. Info:  USPSTF "B" recommendation for mammography every 2 years ages 23-74;  (Medicare covers annually); Medicare covers for all female patients 49 or older   AAA/EKG Screening: completed if indicated and medicare welcome visit.   Alcohol abuse screening: completed if indicated  STIs screening: Completed if indicated  ROS: Negative, with the exception of above  mentioned in HPI  Exam: BP 88/59 mmHg  Pulse 68  Temp(Src) 98.3 F (36.8 C)  Resp 18  Ht 5\' 5"  (1.651 m)  Wt 109 lb 8 oz (49.669 kg)  BMI 18.22 kg/m2  SpO2 97% Gen: Afebrile. No acute distress. Nontoxic in appearance,  well-developed, well-nourished, Asian female. Very pleasant. Thin. HENT: AT. [place]. Bilateral TM visualized and normal in appearance. MMM. Bilateral nares without erythema or swelling. Throat without erythema or exudates. No cough on exam, no hoarseness on exam. Eyes:Pupils Equal Round Reactive to light, Extraocular movements intact,  Conjunctiva without redness, discharge or icterus. Neck/lymp/endocrine: Supple, no lymphadenopathy, no thyromegaly CV: RRR no murmur appreciated, no edema, +2/4 P posterior tibialis pulses Chest: CTAB, no wheeze or crackles. Normal respiratory effort, good air movement. Abd: Soft. Flat. NTND. BS present. No Masses palpated. No hepatosplenomegaly MSK: No obvious deformities, full range of motion. Skin: No rashes, purpura or petechiae.  Neuro: Normal gait. PERLA. EOMi. Alert. Oriented. Cranial nerves II through XII intact. Muscle strength 5/5 upper and lower extremity. DTRs equal bilaterally. Psych: Normal affect, dress and demeanor. Normal speech. Normal thought content and judgment.   Assessment and Plan: PSV23 administered today Zostavax printed today Cbc, cmp, lipid panel Advanced directives, patient encouraged to bring in copy of her advanced directives  Education and Counseling Provided:  See after visit summary that was printed and given to patient 1. Tobacco abuse counseling 2. Alcohol abuse counseling 3. STIs counseling 4. Referrals made  Referrals/orders made if indicated: 1. IBT (Intensive Behavior Therapy) for Cardiovascular disease 2. IBT for Obesity 3. MNT (Medical Nutrition Therapy)  4. Behavioral Counseling for Alcohol abuse  5. HIBT (High Intensity Behavioral Counseling) to prevent STIs (Sexually Transmitted  Infections) 6. [place] for AAA screening - IPPE only 7. EKG screening- IPPE only 8. Low dose CT- lung cancer screen 9. Pelvic/PAP exam 10. PSA/Prostate 11. Screen mammogram 12. HIV screen 13. Hep C screen 14. Glaucoma screen 15. Diabetes screen 16. Colon cancer screen 17. DEXA  Vaccinations Administered: 1. Influenza 2. PPV23/prevnar 13 3. Hepatitis B 4. Tdap - print script 5. Shingle's vaccination- print script  Electronically Signed by: Howard Pouch, DO [place]

## 2015-12-08 NOTE — Patient Instructions (Signed)
  Kelsey [name] , Thank you for taking time to come for your Medicare Wellness Visit. I appreciate your ongoing commitment to your health goals. Please review the following plan we discussed and let me know if I can assist you in the future.   These are the goals we discussed: Goals    None      This is a list of the screening recommended for you and due dates:  Health Maintenance  Topic Date Due  . Shingles Vaccine  10/31/2005  . Pneumonia vaccines (2 of 2 - PPSV23) 04/03/2015  . Flu Shot  02/07/2016  . Mammogram  05/16/2017  . Tetanus Vaccine  12/03/2023  . Colon Cancer Screening  01/07/2024  . DEXA scan (bone density measurement)  Completed  .  Hepatitis C: One time screening is recommended by Center for Disease Control  (CDC) for  adults born from [birth year] through 1965.   Completed    Shingles vaccine given today. We will call you with results once available.  Last pneumonia shot today.  Please get [place] a copy of advanced directives

## 2015-12-09 ENCOUNTER — Telehealth: Payer: Self-pay | Admitting: Family Medicine

## 2015-12-09 NOTE — Telephone Encounter (Signed)
Spoke with patient reviewed lab results and instructions with patient. Patient verbalized understanding . 

## 2015-12-09 NOTE — Telephone Encounter (Signed)
Please call pt: - her labs are all god with the exception of her cholesterol. Mildly higher than last year. Her HDL (good cholesterol is excellent, so that will make her total cholesterol high--> and that is ok). However, her LDL (bad cholesterol) a little higher than desired, would like to see this <130. Please have her increase her fish oil supplement to 2000 mg a day (she is taking 1000 mg).

## 2015-12-12 ENCOUNTER — Encounter: Payer: Self-pay | Admitting: Family Medicine

## 2015-12-27 DIAGNOSIS — H524 Presbyopia: Secondary | ICD-10-CM | POA: Diagnosis not present

## 2015-12-27 DIAGNOSIS — H5213 Myopia, bilateral: Secondary | ICD-10-CM | POA: Diagnosis not present

## 2015-12-27 DIAGNOSIS — H25813 Combined forms of age-related cataract, bilateral: Secondary | ICD-10-CM | POA: Diagnosis not present

## 2016-02-23 ENCOUNTER — Ambulatory Visit (INDEPENDENT_AMBULATORY_CARE_PROVIDER_SITE_OTHER): Payer: Medicare Other | Admitting: Family Medicine

## 2016-02-23 DIAGNOSIS — M810 Age-related osteoporosis without current pathological fracture: Secondary | ICD-10-CM | POA: Diagnosis not present

## 2016-02-23 MED ORDER — DENOSUMAB 60 MG/ML ~~LOC~~ SOLN
60.0000 mg | Freq: Once | SUBCUTANEOUS | Status: AC
  Administered 2017-11-06: 60 mg via SUBCUTANEOUS

## 2016-02-23 NOTE — Progress Notes (Signed)
Patient arrived today for prolia injection. Patient tolerated well.

## 2016-03-13 ENCOUNTER — Encounter: Payer: Self-pay | Admitting: Family Medicine

## 2016-03-13 ENCOUNTER — Ambulatory Visit (INDEPENDENT_AMBULATORY_CARE_PROVIDER_SITE_OTHER): Payer: Medicare Other | Admitting: Family Medicine

## 2016-03-13 VITALS — BP 92/64 | HR 87 | Temp 98.5°F | Resp 20 | Wt 106.2 lb

## 2016-03-13 DIAGNOSIS — L509 Urticaria, unspecified: Secondary | ICD-10-CM

## 2016-03-13 NOTE — Progress Notes (Signed)
Kelsey [name] , August 21, 1945, 70 y.o., female MRN: [id number] Patient Care Team    Relationship Specialty Notifications Start End  [name], DO PCP - General Family Medicine  05/27/15   Delsa Bern, MD Consulting Physician Obstetrics and Gynecology  12/08/15   [name], MD Consulting Physician Gastroenterology  12/08/15   Calvert Cantor, MD Consulting Physician Ophthalmology  12/08/15     CC: rash Subjective: Pt presents for an acute OV with complaints of rash of 2 days duration.  Associated symptoms include worsening rash and itchiness. Rash is spreading, but improving in original areas.   Patient denies new exposures, personal hygiene products, clothing or cleaning agents.She is under more stress than usual. Shew has not been exercising the last 3 weeks since returning from [place].  She has not tried any medications to improve symptoms.   Allergies  Allergen Reactions  . Penicillins Rash  . Sulfa Antibiotics Rash    Under arm and thighs   Social History  Substance Use Topics  . Smoking status: Former Smoker    Packs/day: 0.30    Years: 10.00    Types: Cigarettes    Quit date: 07/09/1978  . Smokeless tobacco: Never Used  . Alcohol use Yes     Comment: 1-2 glasses of wine daily and a 12 oz beer daily   Past Medical History:  Diagnosis Date  . Allergy   . Anemia    iron deficiency  . 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 Bladder prolapse, female, acquired   . Chicken pox as a child  . Dysuria 09/13/2011  . Female bladder prolapse 09/13/2011  . Fibrocystic breast 09/14/2011  . GERD (gastroesophageal reflux disease)   . H/O measles   . H/O mumps   . Hyperlipidemia   . 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 Osteoarthrosis    right shoulder  . Osteoporosis    reclast in Oct  . Ovarian cyst 09/14/2011  . Uterine fibroid 11/2012   See Dr. Cletis Media note, surgery 11/2012  . Vaginitis 09/13/2011   Past Surgical History:  Procedure Laterality Date  . DILATATION & CURRETTAGE/HYSTEROSCOPY WITH  RESECTOCOPE N/A 11/06/2012   Procedure: DILATATION & CURETTAGE/HYSTEROSCOPY WITH RESECTOCOPE;  Surgeon: Alwyn Pea, MD;  Location: [facility];  Service: Gynecology;  Laterality: N/A;  . HERNIA REPAIR  1992  . small intestine hernia  1994  . WISDOM TOOTH EXTRACTION     Family History  Problem Relation Age of Onset  . Heart disease Mother   . Osteoporosis Mother   . Cancer Father     prostate  . Osteoporosis Sister   . Gout Brother   . Gout Maternal Grandmother   . Heart disease Maternal Grandmother   . Alzheimer's disease Maternal Grandfather   . Osteoporosis Sister      Medication List       Accurate as of 03/13/16  1:49 PM. Always use your most recent med list.          Biotin 10 MG Tabs Take by mouth.   CALCIUM + D3 PO Take 300 mg by mouth daily.   Co Q 10 100 MG Caps Take 1 tablet by mouth daily.   denosumab 60 MG/ML Soln injection Commonly known as:  PROLIA Inject 60 mg into the skin once. Administer in upper arm, thigh, or abdomen   ESTRING 2 MG vaginal ring Generic drug:  estradiol   Fish Oil 1000 MG Caps Take 1 capsule by mouth daily.   Glucosamine 750 MG  Tabs Take by mouth daily.   MAGNESIA PO Take 1 tablet by mouth daily. OTC   omeprazole 20 MG capsule Commonly known as:  PRILOSEC Take 1 capsule (20 mg total) by mouth daily.   Vitamin D-3 1000 units Caps Take 4,000 Units by mouth.   Zoster Vaccine Live (PF) 19400 UNT/0.65ML injection Commonly known as:  ZOSTAVAX Inject 19,400 Units into the skin once.       No results found for this or any previous visit (from the past 24 hour(s)). No results found.   ROS: Negative, with the exception of above mentioned in HPI   Objective:  BP 92/64 (BP Location: Left Arm, Patient Position: Sitting, Cuff Size: Normal)   Pulse 87   Temp 98.5 F (36.9 C)   Resp 20   Wt 106 lb 4 oz (48.2 kg)   SpO2 97%   BMI 17.68 kg/m  Body mass index is 17.68 kg/m. Gen: Afebrile. No acute distress. Nontoxic in  appearance, well developed, well nourished. Very pleasant female.  HENT: AT. [place].MMM, no oral lesions. Eyes:Pupils Equal Round Reactive to light, Extraocular movements intact,  Conjunctiva without redness, discharge or icterus. Skin: No  purpura or petechiae. Raised red macular papular rash bilateral biceps, flanks, thighs. And right calf.  Neuro: Normal gait. PERLA. EOMi. Alert. Oriented x3  Psych: Normal affect, dress and demeanor. Normal speech. Normal thought content and judgment.  Assessment/Plan: Kelsey [name] is a 70 y.o. female present for acute OV for  Hives - pt to monitor home environment for any new exposures.  - allegra daily for 5-7 days. Then trial off.  - return to exercise regimen for stress relief.  - F/U 1 week if worsening or not improved.   electronically signed by:  Howard Pouch, DO  [place]

## 2016-03-13 NOTE — Patient Instructions (Signed)
Hives Hives are itchy, red, swollen areas of the skin. They can vary in size and location on your body. Hives can come and go for hours or several days (acute hives) or for several weeks (chronic hives). Hives do not spread from person to person (noncontagious). They may get worse with scratching, exercise, and emotional stress. CAUSES   Allergic reaction to food, additives, or drugs.  Infections, including the common cold.  Illness, such as vasculitis, lupus, or thyroid disease.  Exposure to sunlight, heat, or cold.  Exercise.  Stress.  Contact with chemicals. SYMPTOMS   Red or white swollen patches on the skin. The patches may change size, shape, and location quickly and repeatedly.  Itching.  Swelling of the hands, feet, and face. This may occur if hives develop deeper in the skin. DIAGNOSIS  Your caregiver can usually tell what is wrong by performing a physical exam. Skin or blood tests may also be done to determine the cause of your hives. In some cases, the cause cannot be determined. TREATMENT  Mild cases usually get better with medicines such as antihistamines. Severe cases may require an emergency epinephrine injection. If the cause of your hives is known, treatment includes avoiding that trigger.  HOME CARE INSTRUCTIONS   Avoid causes that trigger your hives.  Take antihistamines as directed by your caregiver to reduce the severity of your hives. Non-sedating or low-sedating antihistamines are usually recommended. Do not drive while taking an antihistamine.  Take any other medicines prescribed for itching as directed by your caregiver.  Wear loose-fitting clothing.  Keep all follow-up appointments as directed by your caregiver. SEEK MEDICAL CARE IF:   You have persistent or severe itching that is not relieved with medicine.  You have painful or swollen joints. SEEK IMMEDIATE MEDICAL CARE IF:   You have a fever.  Your tongue or lips are swollen.  You have  trouble breathing or swallowing.  You feel tightness in the throat or chest.  You have abdominal pain. These problems may be the first sign of a life-threatening allergic reaction. Call your local emergency services (911 in [place] MAKE SURE YOU:   Understand these instructions.  Will watch your condition.  Will get help right away if you are not doing well or get worse.   This information is not intended to replace advice given to you by your health care provider. Make sure you discuss any questions you have with your health care provider.   Document Released: 06/25/2005 Document Revised: 06/30/2013 Document Reviewed: 09/18/2011 Elsevier Interactive Patient Education 2016 [place] daily for 1 week, then trial off.  Get back to exercise regimen for stress relief.

## 2016-03-19 ENCOUNTER — Other Ambulatory Visit: Payer: Self-pay | Admitting: Family Medicine

## 2016-04-12 ENCOUNTER — Ambulatory Visit: Payer: Medicare Other

## 2016-04-12 ENCOUNTER — Ambulatory Visit (INDEPENDENT_AMBULATORY_CARE_PROVIDER_SITE_OTHER): Payer: Medicare Other

## 2016-04-12 DIAGNOSIS — Z23 Encounter for immunization: Secondary | ICD-10-CM

## 2016-05-14 ENCOUNTER — Ambulatory Visit (INDEPENDENT_AMBULATORY_CARE_PROVIDER_SITE_OTHER): Payer: Medicare Other | Admitting: Family Medicine

## 2016-05-14 ENCOUNTER — Encounter: Payer: Self-pay | Admitting: Family Medicine

## 2016-05-14 VITALS — BP 112/65 | HR 71 | Temp 99.0°F | Resp 20 | Ht 65.0 in | Wt 108.0 lb

## 2016-05-14 DIAGNOSIS — H0015 Chalazion left lower eyelid: Secondary | ICD-10-CM | POA: Diagnosis not present

## 2016-05-14 DIAGNOSIS — S0101XA Laceration without foreign body of scalp, initial encounter: Secondary | ICD-10-CM | POA: Diagnosis not present

## 2016-05-14 NOTE — Progress Notes (Signed)
Kelsey [name] , Apr 30, 1946, 70 y.o., female MRN: [id number] Patient Care Team    Relationship Specialty Notifications Start End  [name], DO PCP - General Family Medicine  05/27/15   Delsa Bern, MD Consulting Physician Obstetrics and Gynecology  12/08/15   [name], MD Consulting Physician Gastroenterology  12/08/15   Calvert Cantor, MD Consulting Physician Ophthalmology  12/08/15     Subjective: Pt presents for an acute OV with complaints of   Head laceration: pt presents with concerns over a head laceration she received 2 days ago when a clock fell on her head. She states she was changing the time on the clock when it fell about 1-2 feet and hit her on top of her head. The impact was painful, and she was bleeding from head down her face. She states she waited a few hours, placing pressure over and it stopped bleeding. She has returned to her normal activities and reports only minimal tenderness with touch and no additional bleeding.     Left Eye discomfort: Patient states she has had left eye discomfort for 1 month. She reports it feels like something is underneath her eyelid. She reports always having a small bump on the  Lower lining  Of her eye and she felt it was a little larger. She put a needle in this area and denies expression of fluid, but feels it did make the area smaller. She denies eye drainage, redness or visual changes. She reports she is scheduled to see her eye doctor in 2 weeks and have  Cataract surgery   Allergies  Allergen Reactions  . Penicillins Rash  . Sulfa Antibiotics Rash    Under arm and thighs   Social History  Substance Use Topics  . Smoking status: Former Smoker    Packs/day: 0.30    Years: 10.00    Types: Cigarettes    Quit date: 07/09/1978  . Smokeless tobacco: Never Used  . Alcohol use Yes     Comment: 1-2 glasses of wine daily and a 12 oz beer daily   Past Medical History:  Diagnosis Date  . Allergy   . Anemia    iron deficiency    . 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 Bladder prolapse, female, acquired   . Chicken pox as a child  . Dysuria 09/13/2011  . Female bladder prolapse 09/13/2011  . Fibrocystic breast 09/14/2011  . GERD (gastroesophageal reflux disease)   . H/O measles   . H/O mumps   . Hyperlipidemia   . 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 Osteoarthrosis    right shoulder  . Osteoporosis    reclast in Oct  . Ovarian cyst 09/14/2011  . Uterine fibroid 11/2012   See Dr. Cletis Media note, surgery 11/2012  . Vaginitis 09/13/2011   Past Surgical History:  Procedure Laterality Date  . DILATATION & CURRETTAGE/HYSTEROSCOPY WITH RESECTOCOPE N/A 11/06/2012   Procedure: DILATATION & CURETTAGE/HYSTEROSCOPY WITH RESECTOCOPE;  Surgeon: Alwyn Pea, MD;  Location: [facility];  Service: Gynecology;  Laterality: N/A;  . HERNIA REPAIR  1992  . small intestine hernia  1994  . WISDOM TOOTH EXTRACTION     Family History  Problem Relation Age of Onset  . Heart disease Mother   . Osteoporosis Mother   . Cancer Father     prostate  . Osteoporosis Sister   . Gout Brother   . Gout Maternal Grandmother   . Heart disease Maternal Grandmother   . Alzheimer's disease Maternal Grandfather   .  Osteoporosis Sister      Medication List       Accurate as of 05/14/16  3:10 PM. Always use your most recent med list.          Biotin 10 MG Tabs Take by mouth.   CALCIUM + D3 PO Take 300 mg by mouth daily.   Co Q 10 100 MG Caps Take 1 tablet by mouth daily.   denosumab 60 MG/ML Soln injection Commonly known as:  PROLIA Inject 60 mg into the skin once. Administer in upper arm, thigh, or abdomen   ESTRING 2 MG vaginal ring Generic drug:  estradiol   Fish Oil 1000 MG Caps Take 1 capsule by mouth daily.   Glucosamine 750 MG Tabs Take by mouth daily.   MAGNESIA PO Take 1 tablet by mouth daily. OTC   omeprazole 20 MG capsule Commonly known as:  PRILOSEC TAKE 1 CAPSULE DAILY   Vitamin D-3 1000 units Caps Take 4,000 Units by  mouth.       No results found for this or any previous visit (from the past 24 hour(s)). No results found.   ROS: Negative, with the exception of above mentioned in HPI   Objective:  BP 112/65 (BP Location: Left Arm, Patient Position: Sitting, Cuff Size: Normal)   Pulse 71   Temp 99 F (37.2 C)   Resp 20   Ht 5\' 5"  (1.651 m)   Wt 108 lb (49 kg)   SpO2 99%   BMI 17.97 kg/m  Body mass index is 17.97 kg/m. Gen: Afebrile. No acute distress. Nontoxic in appearance, well developed, well nourished.  HENT: small 1.5cm laceration/abrasion midline scalp about 3 inches from hairline. Dried blood over lac. [place]. MMM Eyes:Pupils Equal Round Reactive to light, Extraocular movements intact,  Conjunctiva without redness, discharge or icterus. Small pinpoint cystic lesion midline/lateral lower lid inside lash line. No ectropion/entropian. No corneal scratch identified.   Assessment/Plan: Kelsey [name] is a 70 y.o. female present for acute OV for  Chalazion of left lower eyelid - discussed with her stye vs chalazion. 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 It has improved since she popped it. Small chalazion remains.  - she was encouraged to discuss with her ophthalmologist. Chalazion usually need removal to fully correct.  Laceration of scalp without foreign body, initial encounter - well healing. Very small laceration/abrasion. Discussed gently treatment with shampoo/water for cleansing. Then apply small amount of abx ointment to area 2x a day.  - F/u PRN     electronically signed by:  Howard Pouch, DO  [place]

## 2016-05-14 NOTE — Patient Instructions (Signed)
Area on your head is a small cut, healing ok. When washing your hair use finger tips, not your nails. Gently massage that area. Keeping clean, and covered with small amount of neosporin.   Tell your eye doctor about your eye. I think it is a chalazion that is causing your discomfort.    Chalazion A chalazion is a swelling or lump on the eyelid. It can affect the upper or lower eyelid. CAUSES This condition may be caused by:  Long-lasting (chronic) inflammation of the eyelid glands.  A blocked oil gland in the eyelid. SYMPTOMS Symptoms of this condition include:  A swelling on the eyelid. The swelling may spread to areas around the eye.  A hard lump on the eyelid. This lump may make it hard to see out of the eye. DIAGNOSIS This condition is diagnosed with an examination of the eye. TREATMENT This condition is treated by applying a warm compress to the eyelid. If the condition does not improve after two days, it may be treated with:  Surgery.  Medicine that is injected into the chalazion by a health care provider.  Medicine that is applied to the eye. HOME CARE INSTRUCTIONS  Do not touch the chalazion.  Do not try to remove the pus, such as by squeezing the chalazion or sticking it with a pin or needle.  Do not rub your eyes.  Wash your hands often. Dry your hands with a clean towel.  Keep your face, scalp, and eyebrows clean.  Avoid wearing eye makeup.  Apply a warm, moist compress to the eyelid 4-6 times a day for 10-15 minutes at a time. This will help to open any blocked glands and help to reduce redness and swelling.  Apply over-the-counter and prescription medicines only as told by your health care provider.  If the chalazion does not break open (rupture) on its own in a month, return to your health care provider.  Keep all follow-up appointments as told by your health care provider. This is important. SEEK MEDICAL CARE IF:  Your eyelid has not improved in 4  weeks.  Your eyelid is getting worse.  You have a fever.  The chalazion does not rupture on its own with home treatment in a month. SEEK IMMEDIATE MEDICAL CARE IF:  You have pain in your eye.  Your vision changes.  The chalazion becomes painful or red  The chalazion gets bigger.   This information is not intended to replace advice given to you by your health care provider. Make sure you discuss any questions you have with your health care provider.   Document Released: 06/22/2000 Document Revised: 03/16/2015 Document Reviewed: 10/18/2014 Elsevier Interactive Patient Education [employer].

## 2016-06-26 DIAGNOSIS — H25813 Combined forms of age-related cataract, bilateral: Secondary | ICD-10-CM | POA: Diagnosis not present

## 2016-06-26 DIAGNOSIS — H524 Presbyopia: Secondary | ICD-10-CM | POA: Diagnosis not present

## 2016-06-26 DIAGNOSIS — H02835 Dermatochalasis of left lower eyelid: Secondary | ICD-10-CM | POA: Diagnosis not present

## 2016-06-26 DIAGNOSIS — H5213 Myopia, bilateral: Secondary | ICD-10-CM | POA: Diagnosis not present

## 2016-06-26 DIAGNOSIS — H02831 Dermatochalasis of right upper eyelid: Secondary | ICD-10-CM | POA: Diagnosis not present

## 2016-06-26 DIAGNOSIS — H02832 Dermatochalasis of right lower eyelid: Secondary | ICD-10-CM | POA: Diagnosis not present

## 2016-06-26 DIAGNOSIS — H02834 Dermatochalasis of left upper eyelid: Secondary | ICD-10-CM | POA: Diagnosis not present

## 2016-07-05 ENCOUNTER — Ambulatory Visit (INDEPENDENT_AMBULATORY_CARE_PROVIDER_SITE_OTHER): Payer: Medicare Other | Admitting: Family Medicine

## 2016-07-05 ENCOUNTER — Encounter: Payer: Self-pay | Admitting: Family Medicine

## 2016-07-05 VITALS — BP 104/67 | HR 80 | Temp 98.8°F | Resp 20 | Ht 65.0 in | Wt 110.0 lb

## 2016-07-05 DIAGNOSIS — M545 Low back pain: Secondary | ICD-10-CM | POA: Diagnosis not present

## 2016-07-05 DIAGNOSIS — Z818 Family history of other mental and behavioral disorders: Secondary | ICD-10-CM | POA: Diagnosis not present

## 2016-07-05 DIAGNOSIS — F99 Mental disorder, not otherwise specified: Secondary | ICD-10-CM | POA: Diagnosis not present

## 2016-07-05 DIAGNOSIS — M533 Sacrococcygeal disorders, not elsewhere classified: Secondary | ICD-10-CM

## 2016-07-05 HISTORY — DX: Mental disorder, not otherwise specified: F99

## 2016-07-05 MED ORDER — NAPROXEN 500 MG PO TABS: 500.0000 mg | ORAL_TABLET | Freq: Two times a day (BID) | ORAL | 0 refills | Status: DC

## 2016-07-05 NOTE — Progress Notes (Addendum)
Kelsey [name] , 02-16-1946, 70 y.o., female MRN: [id number] Patient Care Team    Relationship Specialty Notifications Start End  [name], DO PCP - General Family Medicine  05/27/15   Delsa Bern, MD Consulting Physician Obstetrics and Gynecology  12/08/15   [name], MD Consulting Physician Gastroenterology  12/08/15   Calvert Cantor, MD Consulting Physician Ophthalmology  12/08/15     CC: Low back pain Subjective: Pt presents for an acute OV with complaints of right sided low back pain of 2 weeks duration.  Patient points to her right sacroiliac area and states that she has been experiencing pain for the past 2 weeks in this location, and the last week she has experienced more frequent sharp pain in this area. She states intermittently she feels like when she tries to stand she feels like her back and leg are "disconnected" and feels like her leg is going to give out on her. She states the pain becomes worse when transferring positions from standing to sitting and getting out of bed in the morning. She feels better when she is standing. The pain does not radiate. She reports over Christmas time she tried to get in the hot tub and perform some stretches, which she felt initially helped, but now the pain is worse again. She has not tried any medications. He has had no prior injuries or recent increase in activity. 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  Decreased mental status exam: Patient states that she recently had a screening for a Mini-Mental status exam her right her through a support group benefit since she cares for her husband. She states her husband scored a 23/30 and she only scored a 6/30. She states this has her extremely concerned considering she is the caregiver and she has a family history of dementia. She feels she "possible points "secondary to translation of her English, but she is just not certain and would like further evaluation for this  in her memory.  Allergies  Allergen Reactions  . Penicillins Rash  . Sulfa Antibiotics Rash    Under arm and thighs   Social History  Substance Use Topics  . Smoking status: Former Smoker    Packs/day: 0.30    Years: 10.00    Types: Cigarettes    Quit date: 07/09/1978  . Smokeless tobacco: Never Used  . Alcohol use Yes     Comment: 1-2 glasses of wine daily and a 12 oz beer daily   Past Medical History:  Diagnosis Date  . Allergy   . Anemia    iron deficiency  . 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 Bladder prolapse, female, acquired   . Chicken pox as a child  . Dysuria 09/13/2011  . Female bladder prolapse 09/13/2011  . Fibrocystic breast 09/14/2011  . GERD (gastroesophageal reflux disease)   . H/O measles   . H/O mumps   . Hyperlipidemia   . 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 Osteoarthrosis    right shoulder  . Osteoporosis    reclast in Oct  . Ovarian cyst 09/14/2011  . Uterine fibroid 11/2012   See Dr. Cletis Media note, surgery 11/2012  . Vaginitis 09/13/2011   Past Surgical History:  Procedure Laterality Date  . DILATATION & CURRETTAGE/HYSTEROSCOPY WITH RESECTOCOPE N/A 11/06/2012   Procedure: DILATATION & CURETTAGE/HYSTEROSCOPY WITH RESECTOCOPE;  Surgeon: Alwyn Pea, MD;  Location: [facility];  Service: Gynecology;  Laterality: N/A;  . HERNIA REPAIR  1992  .  small intestine hernia  1994  . WISDOM TOOTH EXTRACTION     Family History  Problem Relation Age of Onset  . Heart disease Mother   . Osteoporosis Mother   . Cancer Father     prostate  . Osteoporosis Sister   . Gout Brother   . Gout Maternal Grandmother   . Heart disease Maternal Grandmother   . Alzheimer's disease Maternal Grandfather   . Osteoporosis Sister    Allergies as of 07/05/2016      Reactions   Penicillins Rash   Sulfa Antibiotics Rash   Under arm and thighs      Medication List       Accurate as of 07/05/16  9:09 AM. Always use your most recent med list.          Biotin 10 MG Tabs Take by  mouth.   CALCIUM + D3 PO Take 300 mg by mouth daily.   Co Q 10 100 MG Caps Take 1 tablet by mouth daily.   ESTRING 2 MG vaginal ring Generic drug:  estradiol   Fish Oil 1000 MG Caps Take 1 capsule by mouth daily.   Glucosamine 750 MG Tabs Take by mouth daily.   MAGNESIA PO Take 1 tablet by mouth daily. OTC   omeprazole 20 MG capsule Commonly known as:  PRILOSEC TAKE 1 CAPSULE DAILY   Vitamin D-3 1000 units Caps Take 4,000 Units by mouth.       No results found for this or any previous visit (from the past 24 hour(s)). No results found.   ROS: Negative, with the exception of above mentioned in HPI   Objective:  BP 104/67 (BP Location: Right Arm, Patient Position: Sitting, Cuff Size: Normal)   Pulse 80   Temp 98.8 F (37.1 C)   Resp 20   Ht 5\' 5"  (1.651 m)   Wt 110 lb (49.9 kg)   SpO2 98%   BMI 18.30 kg/m  Body mass index is 18.3 kg/m. Gen: Afebrile. No acute distress. Nontoxic in appearance, well developed, well nourished. Very pleasant Asian female. HENT: AT. [place]. MMM MSK/Neuro: Normal gait. Difficulty transitioning from sitting/standing positions. Discomfort with flexion. Tender to palpation right SI. Muscle strength 5/5 bilateral lower extremity. Neurovascularly intact distally.   Assessment/Plan: Kelsey [name] is a 70 y.o. female present for acute OV for  Acute right-sided low back pain, with sciatica presence unspecified/SI (sacroiliac) pain - Patient's pain seems to be focal over right sacroiliac joint. Discussed with her treatment for this condition is an anti-inflammatory and eventually stretching. - Icing may also prove to be beneficial if acute cause. - Multiple different therapeutic options were provided to patient, and she chose to take naproxen twice a day for 7 days with meals. This has been called in for her. Patient was given the opportunity to ask questions. -Patient will start stretches for SI/sciatica area in 3-5 days, using pain as  her guide. -She is asked to follow-up in 2-4 weeks only if not resolved, sooner if worsening.  Abnormal mental status exam: - Discussed with patient that the points that she did not attain secondary to her English is a valid reason. The abnormal result did concern her, and she would like further testing to ensure she is okay. Discussed with her there are physicians that specialize in a neuropsych eval, and if she is wanting we can refer her to that today. Pt agreed.   > 25 minutes spent with patient, >50% of time spent face  to face counseling    electronically signed by:  Howard Pouch, DO  [place]

## 2016-07-05 NOTE — Patient Instructions (Addendum)
Start naproxen every 12 hours with a meal for 7 days. Start stretches on day 3, using pain as your guide. Follow up in 2-4 weeks.     Sciatica Rehab Ask your health care provider which exercises are safe for you. Do exercises exactly as told by your health care provider and adjust them as directed. It is normal to feel mild stretching, pulling, tightness, or discomfort as you do these exercises, but you should stop right away if you feel sudden pain or your pain gets worse.Do not begin these exercises until told by your health care provider. Stretching and range of motion exercises These exercises warm up your muscles and joints and improve the movement and flexibility of your hips and your back. These exercises also help to relieve pain, numbness, and tingling. Exercise A: Sciatic nerve glide 1. Sit in a chair with your head facing down toward your chest. Place your hands behind your back. Let your shoulders slump forward. 2. Slowly straighten one of your knees while you tilt your head back as if you are looking toward the ceiling. Only straighten your leg as far as you can without making your symptoms worse. 3. Hold for __________ seconds. 4. Slowly return to the starting position. 5. Repeat with your other leg. Repeat __________ times. Complete this exercise __________ times a day. Exercise B: Knee to chest with hip adduction and internal rotation 1. Lie on your back on a firm surface with both legs straight. 2. Bend one of your knees and move it up toward your chest until you feel a gentle stretch in your lower back and buttock. Then, move your knee toward the shoulder that is on the opposite side from your leg.  Hold your leg in this position by holding onto the front of your knee. 3. Hold for __________ seconds. 4. Slowly return to the starting position. 5. Repeat with your other leg. Repeat __________ times. Complete this exercise __________ times a day. Exercise C: Prone extension  on elbows 1. Lie on your abdomen on a firm surface. A bed may be too soft for this exercise. 2. Prop yourself up on your elbows. 3. Use your arms to help lift your chest up until you feel a gentle stretch in your abdomen and your lower back.  This will place some of your body weight on your elbows. If this is uncomfortable, try stacking pillows under your chest.  Your hips should stay down, against the surface that you are lying on. Keep your hip and back muscles relaxed. 4. Hold for __________ seconds. 5. Slowly relax your upper body and return to the starting position. Repeat __________ times. Complete this exercise __________ times a day. Strengthening exercises These exercises build strength and endurance in your back. Endurance is the ability to use your muscles for a long time, even after they get tired. Exercise D: Pelvic tilt 1. Lie on your back on a firm surface. Bend your knees and keep your feet flat. 2. Tense your abdominal muscles. Tip your pelvis up toward the ceiling and flatten your lower back into the floor.  To help with this exercise, you may place a small towel under your lower back and try to push your back into the towel. 3. Hold for __________ seconds. 4. Let your muscles relax completely before you repeat this exercise. Repeat __________ times. Complete this exercise __________ times a day. Exercise E: Alternating arm and leg raises 1. Get on your hands and knees on a firm surface.  If you are on a hard floor, you may want to use padding to cushion your knees, such as an exercise mat. 2. Line up your arms and legs. Your hands should be below your shoulders, and your knees should be below your hips. 3. Lift your left leg behind you. At the same time, raise your right arm and straighten it in front of you.  Do not lift your leg higher than your hip.  Do not lift your arm higher than your shoulder.  Keep your abdominal and back muscles tight.  Keep your hips facing  the ground.  Do not arch your back.  Keep your balance carefully, and do not hold your breath. 4. Hold for __________ seconds. 5. Slowly return to the starting position and repeat with your right leg and your left arm. Repeat __________ times. Complete this exercise __________ times a day. Posture and body mechanics   Body mechanics refers to the movements and positions of your body while you do your daily activities. Posture is part of body mechanics. Good posture and healthy body mechanics can help to relieve stress in your body's tissues and joints. Good posture means that your spine is in its natural S-curve position (your spine is neutral), your shoulders are pulled back slightly, and your head is not tipped forward. The following are general guidelines for applying improved posture and body mechanics to your everyday activities. Standing   When standing, keep your spine neutral and your feet about hip-width apart. Keep a slight bend in your knees. Your ears, shoulders, and hips should line up.  When you do a task in which you stand in one place for a long time, place one foot up on a stable object that is 2-4 inches (5-10 cm) high, such as a footstool. This helps keep your spine neutral. Sitting  When sitting, keep your spine neutral and keep your feet flat on the floor. Use a footrest, if necessary, and keep your thighs parallel to the floor. Avoid rounding your shoulders, and avoid tilting your head forward.  When working at a desk or a computer, keep your desk at a height where your hands are slightly lower than your elbows. Slide your chair under your desk so you are close enough to maintain good posture.  When working at a computer, place your monitor at a height where you are looking straight ahead and you do not have to tilt your head forward or downward to look at the screen. Resting   When lying down and resting, avoid positions that are most painful for you.  If you have  pain with activities such as sitting, bending, stooping, or squatting (flexion-based activities), lie in a position in which your body does not bend very much. For example, avoid curling up on your side with your arms and knees near your chest (fetal position).  If you have pain with activities such as standing for a long time or reaching with your arms (extension-based activities), lie with your spine in a neutral position and bend your knees slightly. Try the following positions:  Lying on your side with a pillow between your knees.  Lying on your back with a pillow under your knees. Lifting   When lifting objects, keep your feet at least shoulder-width apart and tighten your abdominal muscles.  Bend your knees and hips and keep your spine neutral. It is important to lift using the strength of your legs, not your back. Do not lock your knees straight  out.  Always ask for help to lift heavy or awkward objects. This information is not intended to replace advice given to you by your health care provider. Make sure you discuss any questions you have with your health care provider. Document Released: 06/25/2005 Document Revised: 03/01/2016 Document Reviewed: 03/11/2015 Elsevier Interactive Patient Education  2017 [place].   Please help [place] help you:  It is a privilege to be able to take care of great patients such as yourself. We are honored you have chosen [facility] for your [facility] home. Below you will find basic instructions that you may need to access in the future. Please help [place] help you by reading the instructions, which cover many of the frequent questions we experience.   Prescription refills and request:  -In order to allow more efficient response time, please call your pharmacy for all refills. They will forward the request electronically to [place]. This allows for the quickest possible response. Request left on a nurse line can take longer to refill, since these are  checked as time allows between office patients and other phone calls.  - refill request can take up to 3-5 working days to complete.  - If request is sent electronically and request is appropiate, it is usually completed in 1-2 business days.  - all patients will need to be seen routinely for all chronic medical conditions requiring prescription medications (see follow-up below). If you are overdue for follow up on your condition, you will be asked to make an appointment and we will call in enough medication to cover you until your appointment (up to 30 days).  - all controlled substances will require a face to face visit to request/refill.  - if you desire your prescriptions to go through a new pharmacy, and have an active script at original pharmacy, you will need to call your pharmacy and have scripts transferred to new pharmacy. This is completed between the pharmacy locations and not by your provider.    Results: If any images or labs were ordered, it can take up to 1 week to get results depending on the test ordered and the lab/facility running and resulting the test. - Normal or stable results, which do not need further discussion, will be released to your mychart immediately with attached note to you. A call will not be generated for normal results. Please make certain to sign up for mychart. If you have questions on how to activate your mychart you can call the front office.  - If your results need further discussion, our office will attempt to contact you via phone, and if unable to reach you after 2 attempts, we will release your abnormal result to your mychart with instructions.  - All results will be automatically released in mychart after 1 week.  - Your provider will provide you with explanation and instruction on all relevant material in your results. Please keep in mind, results and labs may appear confusing or abnormal to the untrained eye, but it does not mean they are actually  abnormal for you personally. If you have any questions about your results that are not covered, or you desire more detailed explanation than what was provided, you should make an appointment with your provider to do so.   Our office handles many outgoing and incoming calls daily. If we have not contacted you within 1 week about your results, please check your mychart to see if there is a message first and if not, then  contact our office.  In helping with this matter, you help decrease call volume, and therefore allow [place] to be able to respond to patients needs more efficiently.   Acute office visits (sick visit):  An acute visit is intended for a new problem and are scheduled in shorter time slots to allow schedule openings for patients with new problems. This is the appropriate visit to discuss a new problem. In order to provide you with excellent quality medical care with proper time for you to explain your problem, have an exam and receive treatment with instructions, these appointments should be limited to one new problem per visit. If you experience a new problem, in which you desire to be addressed, please make an acute office visit, we save openings on the schedule to accommodate you. Please do not save your new problem for any other type of visit, let us take care of it properly and quickly for you.   Follow up visits:  Depending on your condition(s) your provider will need to see you routinely in order to provide you with quality care and prescribe medication(s). Most chronic conditions (Example: hypertension, Diabetes, depression/anxiety... etc), require visits a couple times a year. Your provider will instruct you on proper follow up for your personal medical conditions and history. Please make certain to make follow up appointments for your condition as instructed. Failing to do so could result in lapse in your medication treatment/refills. If you request a refill, and are overdue to be seen on a  condition, we will always provide you with a 30 day script (once) to allow you time to schedule.    Medicare wellness (well visit): - we have a wonderful Nurse Maudie Mercury), that will meet with you and provide you will yearly medicare wellness visits. These visits should occur yearly (can not be scheduled less than 1 calendar year apart) and cover preventive health, immunizations, advance directives and screenings you are entitled to yearly through your medicare benefits. Do not miss out on your entitled benefits, this is when medicare will pay for these benefits to be ordered for you.  These are strongly encouraged by your provider and is the appropriate type of visit to make certain you are up to date with all preventive health benefits. If you have not had your medicare wellness exam in the last 12 months, please make certain to schedule one by calling the office and schedule your medicare wellness with Maudie Mercury as soon as possible.   Yearly physical (well visit):  - Adults are recommended to be seen yearly for physicals. Check with your insurance and date of your last physical, most insurances require one calendar year between physicals. Physicals include all preventive health topics, screenings, medical exam and labs that are appropriate for gender/age and history. You may have fasting labs needed at this visit. This is a well visit (not a sick visit), acute topics should not be covered during this visit.  - Pediatric patients are seen more frequently when they are younger. Your provider will advise you on well child visit timing that is appropriate for your their age. - This is not a medicare wellness visit. Medicare wellness exams do not have an exam portion to the visit. Some medicare companies allow for a physical, some do not allow a yearly physical. If your medicare allows a yearly physical you can schedule the medicare wellness with our nurse Maudie Mercury and have your physical with your provider after, on the same  day. Please check with insurance  for your full benefits.   Late Policy/No Shows:  - all new patients should arrive 15-30 minutes earlier than appointment to allow [place] time  to  obtain all personal demographics,  insurance information and for you to complete office paperwork. - All established patients should arrive 10-15 minutes earlier than appointment time to update all information and be checked in .  - In our best efforts to run on time, if you are late for your appointment you will be asked to either reschedule or if able, we will work you back into the schedule. There will be a wait time to work you back in the schedule,  depending on availability.  - If you are unable to make it to your appointment as scheduled, please call 24 hours ahead of time to allow [place] to fill the time slot with someone else who needs to be seen. If you do not cancel your appointment ahead of time, you may be charged a no show fee.

## 2016-07-05 NOTE — Addendum Note (Signed)
Addended by: Howard Pouch A on: 07/05/2016 04:52 PM   Modules accepted: Orders

## 2016-08-05 ENCOUNTER — Emergency Department (HOSPITAL_COMMUNITY)
Admission: EM | Admit: 2016-08-05 | Discharge: 2016-08-05 | Disposition: A | Discharge status: Home / Self Care | Payer: Medicare Other | Attending: Emergency Medicine | Admitting: Emergency Medicine

## 2016-08-05 ENCOUNTER — Encounter (HOSPITAL_COMMUNITY): Payer: Self-pay | Admitting: Emergency Medicine

## 2016-08-05 ENCOUNTER — Emergency Department (HOSPITAL_COMMUNITY): Payer: Medicare Other

## 2016-08-05 DIAGNOSIS — R05 Cough: Secondary | ICD-10-CM | POA: Diagnosis not present

## 2016-08-05 DIAGNOSIS — Z87891 Personal history of nicotine dependence: Secondary | ICD-10-CM | POA: Diagnosis not present

## 2016-08-05 DIAGNOSIS — J111 Influenza due to unidentified influenza virus with other respiratory manifestations: Secondary | ICD-10-CM

## 2016-08-05 DIAGNOSIS — R69 Illness, unspecified: Secondary | ICD-10-CM

## 2016-08-05 LAB — CBC WITH DIFFERENTIAL/PLATELET
BASOS ABS: 0 10*3/uL (ref 0.0–0.1)
Basophils Relative: 0 %
Eosinophils Absolute: 0 10*3/uL (ref 0.0–0.7)
Eosinophils Relative: 0 %
HEMATOCRIT: 34.7 % — AB (ref 36.0–46.0)
Hemoglobin: 11.9 g/dL — ABNORMAL LOW (ref 12.0–15.0)
LYMPHS PCT: 13 %
Lymphs Abs: 0.6 10*3/uL — ABNORMAL LOW (ref 0.7–4.0)
MCH: 31.5 pg (ref 26.0–34.0)
MCHC: 34.3 g/dL (ref 30.0–36.0)
MCV: 91.8 fL (ref 78.0–100.0)
MONO ABS: 0.5 10*3/uL (ref 0.1–1.0)
MONOS PCT: 9 %
NEUTROS ABS: 3.7 10*3/uL (ref 1.7–7.7)
Neutrophils Relative %: 78 %
Platelets: 200 10*3/uL (ref 150–400)
RBC: 3.78 MIL/uL — ABNORMAL LOW (ref 3.87–5.11)
RDW: 13.8 % (ref 11.5–15.5)
WBC: 4.8 10*3/uL (ref 4.0–10.5)

## 2016-08-05 LAB — BASIC METABOLIC PANEL
ANION GAP: 6 (ref 5–15)
BUN: 12 mg/dL (ref 6–20)
CALCIUM: 8.2 mg/dL — AB (ref 8.9–10.3)
CHLORIDE: 102 mmol/L (ref 101–111)
CO2: 26 mmol/L (ref 22–32)
Creatinine, Ser: 0.55 mg/dL (ref 0.44–1.00)
GFR calc Af Amer: >60 mL/min (ref >60)
GFR calc non Af Amer: >60 mL/min (ref >60)
GLUCOSE: 97 mg/dL (ref 65–99)
Potassium: 3.5 mmol/L (ref 3.5–5.1)
Sodium: 134 mmol/L — ABNORMAL LOW (ref 135–145)

## 2016-08-05 LAB — I-STAT CG4 LACTIC ACID, ED: Lactic Acid, Venous: 0.53 mmol/L (ref 0.5–1.9)

## 2016-08-05 MED ORDER — SODIUM CHLORIDE 0.9 % IV BOLUS (SEPSIS)
1000.0000 mL | Freq: Once | INTRAVENOUS | Status: AC
  Administered 2016-08-05: 1000 mL via INTRAVENOUS

## 2016-08-05 MED ORDER — ACETAMINOPHEN 500 MG PO TABS
1000.0000 mg | ORAL_TABLET | Freq: Once | ORAL | Status: AC
  Administered 2016-08-05: 1000 mg via ORAL
  Filled 2016-08-05: qty 2

## 2016-08-05 MED ORDER — ACETAMINOPHEN 325 MG PO TABS: 650.0000 mg | ORAL_TABLET | Freq: Once | ORAL | Status: DC | PRN

## 2016-08-05 MED ORDER — OSELTAMIVIR PHOSPHATE 75 MG PO CAPS
75.0000 mg | ORAL_CAPSULE | Freq: Once | ORAL | Status: AC
  Administered 2016-08-05: 75 mg via ORAL
  Filled 2016-08-05: qty 1

## 2016-08-05 MED ORDER — OSELTAMIVIR PHOSPHATE 75 MG PO CAPS: 75.0000 mg | ORAL_CAPSULE | Freq: Two times a day (BID) | ORAL | 0 refills | Status: DC

## 2016-08-05 NOTE — ED Triage Notes (Signed)
Pt c/o fevers/chills, cough. Chronic back pain, unable to be certain if feeling body aches or chronic pain. Home temperature 102.5, self-treating with ice packs on head. Pt's husband flu positive. No emesis/diarrhea, abdominal pain.

## 2016-08-05 NOTE — ED Notes (Signed)
Oxygen saturation 95% when ambulating.

## 2016-08-05 NOTE — ED Notes (Signed)
Bed: WA02 Expected date:  Expected time:  Means of arrival:  Comments: 71 yo fever/vomiting

## 2016-08-05 NOTE — ED Provider Notes (Signed)
[facility] DEPT Provider Note   CSN: [id number] Arrival date & time: 08/05/16  1708  By signing my name below, I, Oleh Genin, attest that this documentation has been prepared under the direction and in the presence of Carlisle Cater, [place]. Electronically Signed: Oleh Genin, Scribe. 08/05/16. 9:13 PM.   History   Chief Complaint Chief Complaint  Patient presents with  . Cough    HPI Kelsey [name] is a 71 y.o. female who presents to the ED for evaluation of flu-like symptoms. This patient was in her usual health until 3 days ago when she developed a mild dry cough which has worsened since that time. The patient states that she is frequently visiting her husband who is an inpatient at this facility and is known to have a positive flu test. She was at home this afternoon when she had new onset of fever, chills, generalized muscle aching, abdominal pain, and joint pain. She denies any vomiting, diarrhea, or urinary complaints. She has no other complaints at this time.   The history is provided by the patient. No language interpreter was used.    Past Medical History:  Diagnosis Date  . Allergy   . Anemia    iron deficiency  . 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 Bladder prolapse, female, acquired   . Chicken pox as a child  . Dysuria 09/13/2011  . Female bladder prolapse 09/13/2011  . Fibrocystic breast 09/14/2011  . GERD (gastroesophageal reflux disease)   . H/O measles   . H/O mumps   . Hyperlipidemia   . 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 Osteoarthrosis    right shoulder  . Osteoporosis    reclast in Oct  . Ovarian cyst 09/14/2011  . Uterine fibroid 11/2012   See Dr. Cletis Media note, surgery 11/2012  . Vaginitis 09/13/2011    Patient Active Problem List   Diagnosis Date Noted  . 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 Chalazion of left lower eyelid 05/14/2016  . Laceration of scalp without foreign body 05/14/2016  . [facility] spotted fever) 11/16/2015  .  Hormone replacement therapy 11/16/2015  . Situational anxiety 03/19/2014  . Leg length discrepancy 01/25/2014  . History of iron deficiency anemia 12/02/2013  . Preventative health care 12/02/2013  . Underweight 12/02/2013  . Dyslipidemia 12/02/2013  . Caregiver stress 12/02/2013  . Unspecified vitamin D deficiency 12/03/2012  . Osteoarthrosis   . Fibrocystic breast 09/14/2011  . 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 GERD (gastroesophageal reflux disease)   . Osteoporosis     Past Surgical History:  Procedure Laterality Date  . DILATATION & CURRETTAGE/HYSTEROSCOPY WITH RESECTOCOPE N/A 11/06/2012   Procedure: DILATATION & CURETTAGE/HYSTEROSCOPY WITH RESECTOCOPE;  Surgeon: Alwyn Pea, MD;  Location: [facility];  Service: Gynecology;  Laterality: N/A;  . HERNIA REPAIR  1992  . small intestine hernia  1994  . WISDOM TOOTH EXTRACTION      OB History    Gravida Para Term Preterm AB Living   0             SAB TAB Ectopic Multiple Live Births                   Home Medications    Prior to Admission medications   Medication Sig Start Date End Date Taking? Authorizing Provider  Biotin 10 MG TABS Take 1 tablet by mouth daily.    Yes Historical Provider, MD  Calcium Carb-Cholecalciferol (CALCIUM + D3 PO) Take 300 mg  by mouth daily.   Yes Historical Provider, MD  Cholecalciferol (VITAMIN D-3) 1000 units CAPS Take 4,000 Units by mouth.   Yes Historical Provider, MD  Coenzyme Q10 (CO Q 10) 100 MG CAPS Take 1 tablet by mouth daily.   Yes Historical Provider, MD  Glucosamine 750 MG TABS Take by mouth daily.    Yes Historical Provider, MD  Magnesium Hydroxide (MAGNESIA PO) Take 1 tablet by mouth daily. OTC   Yes Historical Provider, MD  Omega-3 Fatty Acids (FISH OIL) 1000 MG CAPS Take 1 capsule by mouth daily.   Yes Historical Provider, MD  omeprazole (PRILOSEC) 20 MG capsule TAKE 1 CAPSULE DAILY Patient taking differently: TAKE 20mg  BY MOUTH DAILY 03/19/16  Yes  Tammi Sou, MD  naproxen (NAPROSYN) 500 MG tablet Take 1 tablet (500 mg total) by mouth 2 (two) times daily with a meal. 07/05/16   [name] [name], DO    Family History Family History  Problem Relation Age of Onset  . Heart disease Mother   . Osteoporosis Mother   . Cancer Father     prostate  . Osteoporosis Sister   . Gout Brother   . Gout Maternal Grandmother   . Heart disease Maternal Grandmother   . Alzheimer's disease Maternal Grandfather   . Osteoporosis Sister     Social History Social History  Substance Use Topics  . Smoking status: Former Smoker    Packs/day: 0.30    Years: 10.00    Types: Cigarettes    Quit date: 07/09/1978  . Smokeless tobacco: Never Used  . Alcohol use Yes     Comment: 1-2 glasses of wine daily and a 12 oz beer daily     Allergies   Penicillins and Sulfa antibiotics   Review of Systems Review of Systems  Constitutional: Positive for chills and fever. Negative for fatigue.  HENT: Negative for congestion, ear pain, rhinorrhea, sinus pressure and sore throat.   Eyes: Negative for redness.  Respiratory: Positive for cough. Negative for wheezing.   Gastrointestinal: Negative for abdominal pain, diarrhea, nausea and vomiting.  Genitourinary: Negative for dysuria, frequency and urgency.  Musculoskeletal: Positive for arthralgias and myalgias. Negative for neck stiffness.  Skin: Negative for rash.  Neurological: Negative for headaches.  Hematological: Negative for adenopathy.  All other systems reviewed and are negative.    Physical Exam Updated Vital Signs BP 121/66 (BP Location: Left Arm)   Pulse 88   Temp 101 F (38.3 C) (Oral)   Resp 20   SpO2 96%   Physical Exam  Constitutional: She appears well-developed and well-nourished.  HENT:  Head: Normocephalic and atraumatic.  Right Ear: Tympanic membrane, external ear and ear canal normal.  Left Ear: Tympanic membrane, external ear and ear canal normal.  Nose: Nose normal. No  mucosal edema or rhinorrhea.  Mouth/Throat: Uvula is midline, oropharynx is clear and moist and mucous membranes are normal. Mucous membranes are not dry. No oral lesions. No trismus in the jaw. No uvula swelling. No oropharyngeal exudate, posterior oropharyngeal edema, posterior oropharyngeal erythema or tonsillar abscesses.  Eyes: Conjunctivae are normal. Right eye exhibits no discharge. Left eye exhibits no discharge.  Neck: Normal range of motion. Neck supple.  Cardiovascular: Normal rate, regular rhythm and normal heart sounds.   Pulmonary/Chest: Effort normal and breath sounds normal. No respiratory distress. She has no wheezes. She has no rales.  Frequent coughing during exam  Abdominal: Soft. There is no tenderness.  Musculoskeletal: She exhibits no edema.  Lymphadenopathy:  She has no cervical adenopathy.  Neurological: She is alert.  Skin: Skin is warm and dry.  Psychiatric: She has a normal mood and affect.  Nursing note and vitals reviewed.    ED Treatments / Results  DIAGNOSTIC STUDIES: Oxygen Saturation is 96 percent on room air which is normal by my interpretation.    COORDINATION OF CARE: 9:04 PM Discussed treatment plan with pt at bedside and pt agreed to plan.  Labs (all labs ordered are listed, but only abnormal results are displayed) Labs Reviewed  CBC WITH DIFFERENTIAL/PLATELET - Abnormal; Notable for the following:       Result Value   RBC 3.78 (*)    Hemoglobin 11.9 (*)    HCT 34.7 (*)    Lymphs Abs 0.6 (*)    All other components within normal limits  BASIC METABOLIC PANEL - Abnormal; Notable for the following:    Sodium 134 (*)    Calcium 8.2 (*)    All other components within normal limits  I-STAT CG4 LACTIC ACID, ED    Radiology Dg Chest 2 View  Result Date: 08/05/2016 CLINICAL DATA:  Fever, chills and cough.  Chronic back pain. EXAM: CHEST  2 VIEW COMPARISON:  None. FINDINGS: Lungs are somewhat hyperexpanded without focal consolidation or  effusion. Symmetric round densities of projected over the lower lungs compatible with the nipples. Dense 1 cm calcification projected within the lower right breast. Cardiomediastinal silhouette is within normal. There is minimal degenerate change of the spine. Moderate costochondral calcifications. IMPRESSION: No active cardiopulmonary disease. Electronically Signed   By: Marin Olp M.D.   On: 08/05/2016 21:57    Procedures Procedures (including critical care time)  Medications Ordered in ED Medications  acetaminophen (TYLENOL) tablet 650 mg (not administered)  sodium chloride 0.9 % bolus 1,000 mL (0 mLs Intravenous Stopped 08/05/16 2257)  acetaminophen (TYLENOL) tablet 1,000 mg (1,000 mg Oral Given 08/05/16 2131)  oseltamivir (TAMIFLU) capsule 75 mg (75 mg Oral Given 08/05/16 2302)     Initial Impression / Assessment and Plan / ED Course  I have reviewed the triage vital signs and the nursing notes.  Pertinent labs & imaging results that were available during my care of the patient were reviewed by me and considered in my medical decision making (see chart for details).       Vital signs reviewed and are as follows: BP (!) 97/47 (BP Location: Left Arm)   Pulse 79   Temp 98.6 F (37 C) (Oral)   Resp 18   SpO2 96%    11:31 PM Patient discussed with and seen by Dr. Vanita Panda. Presumptive flu. She has ambulated without desat. Will treat with Tamiflu. Patient discharged to home. Encouraged to rest and drink plenty of fluids.  Patient told to return to ED or see their primary doctor if their symptoms worsen, high fever not controlled with tylenol, persistent vomiting, they feel they are dehydrated, or if they have any other concerns.  Patient verbalized understanding and agreed with plan.     Final Clinical Impressions(s) / ED Diagnoses   Final diagnoses:  Influenza-like illness   Patient with symptoms consistent with influenza. She has positive contact with her husband. Labs and  x-ray reassuring. She will be started on Tamiflu. Vitals are stable, low-grade fever. No signs of dehydration, tolerating PO's. Lungs are clear. Supportive therapy indicated with return if symptoms worsen. Patient counseled.   New Prescriptions New Prescriptions   OSELTAMIVIR (TAMIFLU) 75 MG CAPSULE    Take 1  capsule (75 mg total) by mouth every 12 (twelve) hours.  I personally performed the services described in this documentation, which was scribed in my presence. The recorded information has been reviewed and is accurate.    Carlisle Cater, PA-C 08/05/16 2333    Carmin Muskrat, MD 08/07/16 [phone]

## 2016-08-05 NOTE — ED Notes (Signed)
No respiratory or acute distress noted alert and oriented x 3 call light in reach able to speak in full sentences. 

## 2016-08-05 NOTE — Discharge Instructions (Signed)
Please read and follow all provided instructions.  Your diagnoses today include:  1. Influenza-like illness     Tests performed today include:  Blood counts and electrolytes  Chest x-ray  Vital signs. See below for your results today.   Medications prescribed:   Tamiflu - medication for flu  Take any prescribed medications only as directed.  Home care instructions:  Follow any educational materials contained in this packet. Please continue drinking plenty of fluids. Use over-the-counter cold and flu medications as needed as directed on packaging for symptom relief. You may also use ibuprofen or tylenol as directed on packaging for pain or fever.   BE VERY CAREFUL not to take multiple medicines containing Tylenol (also called acetaminophen). Doing so can lead to an overdose which can damage your liver and cause liver failure and possibly death.   Follow-up instructions: Please follow-up with your primary care provider in the next 3 days for further evaluation of your symptoms.   Return instructions:   Please return to the Emergency Department if you experience worsening symptoms.  Please return if you have a high fever greater than 101 degrees not controlled with over-the-counter medications, persistent vomiting and cannot keep down fluids, or worsening trouble breathing.  Please return if you have any other emergent concerns.  Additional Information:  Your vital signs today were: BP 97/55 (BP Location: Left Arm)    Pulse 86    Temp 101.4 F (38.6 C) (Oral)    Resp 18    SpO2 96%  If your blood pressure (BP) was elevated above 135/85 this visit, please have this repeated by your doctor within one month.

## 2016-08-10 DIAGNOSIS — Z1231 Encounter for screening mammogram for malignant neoplasm of breast: Secondary | ICD-10-CM | POA: Diagnosis not present

## 2016-08-14 ENCOUNTER — Ambulatory Visit (INDEPENDENT_AMBULATORY_CARE_PROVIDER_SITE_OTHER): Payer: Medicare Other | Admitting: Psychiatry

## 2016-08-14 DIAGNOSIS — F4324 Adjustment disorder with disturbance of conduct: Secondary | ICD-10-CM | POA: Diagnosis not present

## 2016-08-27 ENCOUNTER — Telehealth: Payer: Self-pay | Admitting: Family Medicine

## 2016-08-27 NOTE — Telephone Encounter (Signed)
LMOM for patient stating after doing insurance verification for Prolia, it was discovered that she has not met her deductible and she may be billed around $60 for her prolia injection.  Just wanted to make patient aware prior to her appointment 08/28/16.

## 2016-08-28 ENCOUNTER — Ambulatory Visit (INDEPENDENT_AMBULATORY_CARE_PROVIDER_SITE_OTHER): Payer: Medicare Other | Admitting: Family Medicine

## 2016-08-28 DIAGNOSIS — M810 Age-related osteoporosis without current pathological fracture: Secondary | ICD-10-CM

## 2016-08-28 MED ORDER — DENOSUMAB 60 MG/ML ~~LOC~~ SOLN
60.0000 mg | Freq: Once | SUBCUTANEOUS | Status: AC
  Administered 2016-08-28: 60 mg via SUBCUTANEOUS

## 2016-08-28 NOTE — Telephone Encounter (Signed)
Patient aware.

## 2016-08-28 NOTE — Progress Notes (Signed)
Noted.  Medical screening examination/treatment/procedure(s) were performed by non-physician practitioner and as supervising physician I was immediately available for consultation/collaboration.  I agree with above assessment and plan.  Electronically Signed by: Howard Pouch, DO [facility]

## 2016-09-15 ENCOUNTER — Other Ambulatory Visit: Payer: Self-pay | Admitting: Family Medicine

## 2016-11-01 DIAGNOSIS — M545 Low back pain: Secondary | ICD-10-CM | POA: Diagnosis not present

## 2016-11-01 DIAGNOSIS — M9902 Segmental and somatic dysfunction of thoracic region: Secondary | ICD-10-CM | POA: Diagnosis not present

## 2016-11-01 DIAGNOSIS — M546 Pain in thoracic spine: Secondary | ICD-10-CM | POA: Diagnosis not present

## 2016-11-01 DIAGNOSIS — M542 Cervicalgia: Secondary | ICD-10-CM | POA: Diagnosis not present

## 2016-11-01 DIAGNOSIS — M9903 Segmental and somatic dysfunction of lumbar region: Secondary | ICD-10-CM | POA: Diagnosis not present

## 2016-11-01 DIAGNOSIS — M9901 Segmental and somatic dysfunction of cervical region: Secondary | ICD-10-CM | POA: Diagnosis not present

## 2016-11-05 ENCOUNTER — Encounter: Payer: Self-pay | Admitting: Family Medicine

## 2016-11-05 ENCOUNTER — Other Ambulatory Visit (HOSPITAL_COMMUNITY)
Admission: RE | Admit: 2016-11-05 | Discharge: 2016-11-05 | Disposition: A | Discharge status: Home / Self Care | Payer: Medicare Other | Source: Ambulatory Visit | Attending: Family Medicine | Admitting: Family Medicine

## 2016-11-05 ENCOUNTER — Ambulatory Visit (INDEPENDENT_AMBULATORY_CARE_PROVIDER_SITE_OTHER): Payer: Medicare Other | Admitting: Family Medicine

## 2016-11-05 VITALS — BP 99/66 | HR 81 | Temp 98.0°F | Resp 20 | Ht 65.0 in | Wt 109.0 lb

## 2016-11-05 DIAGNOSIS — Z3044 Encounter for surveillance of vaginal ring hormonal contraceptive device: Secondary | ICD-10-CM

## 2016-11-05 DIAGNOSIS — N952 Postmenopausal atrophic vaginitis: Secondary | ICD-10-CM | POA: Diagnosis not present

## 2016-11-05 DIAGNOSIS — Z79890 Hormone replacement therapy (postmenopausal): Secondary | ICD-10-CM

## 2016-11-05 MED ORDER — ESTRING 2 MG VA RING: 2.0000 mg | VAGINAL_RING | VAGINAL | 4 refills | Status: DC

## 2016-11-05 NOTE — Progress Notes (Signed)
Kelsey [name] , [birth year], 71 y.o., female MRN: [id number] Patient Care Team    Relationship Specialty Notifications Start End  [name], DO PCP - General Family Medicine  05/27/15   Delsa Bern, MD Consulting Physician Obstetrics and Gynecology  12/08/15   [name], MD Consulting Physician Gastroenterology  12/08/15   Calvert Cantor, MD Consulting Physician Ophthalmology  12/08/15     Chief Complaint  Patient presents with  . Gynecologic Exam     Subjective:  Hormone therpay/atrophic vaginitis: Patient presents for annual pelvic exam. 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 Patient has been prescribed ESTRInG 2 mg vaginal ring every 90 days for approximately one year. Her last pelvic exam was with Pap and reported normal April 2017. Her last mammogram was November 2016, and reported normal. Patient feels her estrogen replacement has helped with her vaginal dryness and atrophy changes. She denies any dyspareunia, vaginal irritation or vaginal bleeding. Patient does have a family history of heart disease and osteoporosis. No breast cancer family history.  Depression screen [facility] 2/9 12/08/2015 12/02/2013  Decreased Interest 0 0  Down, Depressed, Hopeless 0 1  PHQ - 2 Score 0 1    Allergies  Allergen Reactions  . Penicillins Rash    Has patient had a PCN reaction causing immediate rash, facial/tongue/throat swelling, SOB or lightheadedness with hypotension: no Has patient had a PCN reaction causing severe rash involving mucus membranes or skin necrosis: yes Has patient had a PCN reaction that required hospitalization: no Has patient had a PCN reaction occurring within the last 10 years: no If all of the above answers are "NO", then may proceed with Cephalosporin use.   . Sulfa Antibiotics Rash    Under arm and thighs   Social History  Substance Use Topics  . Smoking status: Former Smoker    Packs/day: 0.30    Years: 10.00   Types: Cigarettes    Quit date: 07/09/1978  . Smokeless tobacco: Never Used  . Alcohol use Yes     Comment: 1-2 glasses of wine daily and a 12 oz beer daily   Past Medical History:  Diagnosis Date  . Allergy   . Anemia    iron deficiency  . 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 Bladder prolapse, female, acquired   . Chicken pox as a child  . Dysuria 09/13/2011  . Female bladder prolapse 09/13/2011  . Fibrocystic breast 09/14/2011  . GERD (gastroesophageal reflux disease)   . H/O measles   . H/O mumps   . Hyperlipidemia   . 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 Osteoarthrosis    right shoulder  . Osteoporosis    reclast in Oct  . Ovarian cyst 09/14/2011  . Uterine fibroid 11/2012   See Dr. Cletis Media note, surgery 11/2012  . Vaginitis 09/13/2011   Past Surgical History:  Procedure Laterality Date  . DILATATION & CURRETTAGE/HYSTEROSCOPY WITH RESECTOCOPE N/A 11/06/2012   Procedure: DILATATION & CURETTAGE/HYSTEROSCOPY WITH RESECTOCOPE;  Surgeon: Alwyn Pea, MD;  Location: [facility];  Service: Gynecology;  Laterality: N/A;  . HERNIA REPAIR  1992  . small intestine hernia  1994  . WISDOM TOOTH EXTRACTION     Family History  Problem Relation Age of Onset  . Heart disease Mother   . Osteoporosis Mother   . Cancer Father     prostate  . Osteoporosis Sister   . Gout Brother   . Gout Maternal Grandmother   . Heart disease Maternal Grandmother   .  Alzheimer's disease Maternal Grandfather   . Osteoporosis Sister    Allergies as of 11/05/2016      Reactions   Penicillins Rash   Has patient had a PCN reaction causing immediate rash, facial/tongue/throat swelling, SOB or lightheadedness with hypotension: no Has patient had a PCN reaction causing severe rash involving mucus membranes or skin necrosis: yes Has patient had a PCN reaction that required hospitalization: no Has patient had a PCN reaction occurring within the last 10 years: no If all of the above answers are "NO", then may proceed with  Cephalosporin use.   Sulfa Antibiotics Rash   Under arm and thighs      Medication List       Accurate as of 11/05/16  2:24 PM. Always use your most recent med list.          Biotin 10 MG Tabs Take 1 tablet by mouth daily.   CALCIUM + D3 PO Take 300 mg by mouth daily.   Co Q 10 100 MG Caps Take 1 tablet by mouth daily.   ESTRING 2 MG vaginal ring Generic drug:  estradiol Place 2 mg vaginally every 3 (three) months.   Fish Oil 1000 MG Caps Take 1 capsule by mouth daily.   Glucosamine 750 MG Tabs Take by mouth daily.   MAGNESIA PO Take 1 tablet by mouth daily. OTC   naproxen 500 MG tablet Commonly known as:  NAPROSYN Take 1 tablet (500 mg total) by mouth 2 (two) times daily with a meal.   omeprazole 20 MG capsule Commonly known as:  PRILOSEC TAKE 1 CAPSULE DAILY   Vitamin D-3 1000 units Caps Take 4,000 Units by mouth.       All past medical history, surgical history, allergies, family history, immunizations andmedications were updated in the EMR today and reviewed under the history and medication portions of their EMR.     ROS: Negative, with the exception of above mentioned in HPI   Objective:  BP 99/66 (BP Location: Right Arm, Patient Position: Sitting, Cuff Size: Normal)   Pulse 81   Temp 98 F (36.7 C)   Resp 20   Ht 5\' 5"  (1.651 m)   Wt 109 lb (49.4 kg)   SpO2 98%   BMI 18.14 kg/m  Body mass index is 18.14 kg/m. Gen: Afebrile. No acute distress. Nontoxic in appearance, well developed, well nourished.  HENT: AT. [place].  MMM, no oral lesions.  Eyes:Pupils Equal Round Reactive to light, Extraocular movements intact,  Conjunctiva without redness, discharge or icterus. Abd: Soft. NTND. BS Present. No Masses palpated. No rebound or guarding.  Neuro:  Normal gait. PERLA. EOMi. Alert. Oriented x3  Psych: Normal affect, dress and demeanor. Normal speech. Normal thought content and judgment. Breasts: breasts appear normal, symmetrical, no tenderness on  exam, no suspicious masses, no skin or nipple changes or axillary nodes. GYN:  External genitalia within normal limits, normal hair distribution, no lesions. Urethral meatus normal, no lesions. Vaginal mucosa pink, moist, normal rugae for age, no lesions. No cystocele or rectocele. cervix without lesions, moderate white thick discharge. Bimanual exam revealed normal uterus.  No bladder/suprapubic fullness, masses or tenderness. No cervical motion tenderness. No adnexal fullness. Anus and perineum within normal limits, no lesions.   No exam data present No results found. No results found for this or any previous visit (from the past 24 hour(s)).  Assessment/Plan: Kelsey [name] is a 71 y.o. female present for OV for  Hormone replacement therapy/atrophic vaginitis -  Discussed routine pelvic/Pap and mammograms with patient today. Discussed although the ring is very low estrogen, there still is some possibility of increased chance that cardiovascular disease, breast cancer. - Last Pap 10/2015, repeat every 3 years if on estrogen therapy. - Yearly pelvic exam completed today. - Patient encouraged to schedule mammogram, encouraged her not to go past every 2 years for mammogram completion. Ideally yearly on medication. - Wet prep sent today for vaginal discharge, Cervicovaginal ancillary only - Refills on ESTRING 2mg  every 90 days, x3 refills.  - Follow-up 12 months.  Reviewed expectations re: course of current medical issues.  Discussed self-management of symptoms.  Outlined signs and symptoms indicating need for more acute intervention.  Patient verbalized understanding and all questions were answered.  Patient received an After-Visit Summary.     Note is dictated utilizing voice recognition software. Although note has been proof read prior to signing, occasional typographical errors still can be missed. If any questions arise, please do not hesitate to call for verification.    electronically signed by:  Howard Pouch, DO  [place]

## 2016-11-05 NOTE — Patient Instructions (Signed)
I have refilled your estrogen therapy.  Makes sure to stay updated with mammogram and yearly pelvics (pap every 3 years).  Let me know on the chiropractor> or if you want referral to OMT .

## 2016-11-06 ENCOUNTER — Telehealth: Payer: Self-pay | Admitting: Family Medicine

## 2016-11-06 DIAGNOSIS — M545 Low back pain: Secondary | ICD-10-CM | POA: Diagnosis not present

## 2016-11-06 DIAGNOSIS — M546 Pain in thoracic spine: Secondary | ICD-10-CM | POA: Diagnosis not present

## 2016-11-06 DIAGNOSIS — M9901 Segmental and somatic dysfunction of cervical region: Secondary | ICD-10-CM | POA: Diagnosis not present

## 2016-11-06 DIAGNOSIS — M9902 Segmental and somatic dysfunction of thoracic region: Secondary | ICD-10-CM | POA: Diagnosis not present

## 2016-11-06 DIAGNOSIS — M542 Cervicalgia: Secondary | ICD-10-CM | POA: Diagnosis not present

## 2016-11-06 DIAGNOSIS — M9903 Segmental and somatic dysfunction of lumbar region: Secondary | ICD-10-CM | POA: Diagnosis not present

## 2016-11-06 DIAGNOSIS — B379 Candidiasis, unspecified: Secondary | ICD-10-CM | POA: Insufficient documentation

## 2016-11-06 LAB — CERVICOVAGINAL ANCILLARY ONLY: WET PREP (BD AFFIRM): POSITIVE — AB

## 2016-11-06 MED ORDER — FLUCONAZOLE 150 MG PO TABS: ORAL_TABLET | ORAL | 0 refills | Status: DC

## 2016-11-06 NOTE — Telephone Encounter (Signed)
Please call pt: - she did have a yeast infection by the swab we took yesterday. I have called in a medicine called diflucan which is one pill today and then repeat in 3 days.

## 2016-11-06 NOTE — Telephone Encounter (Signed)
Left message for patient to return call to review.

## 2016-11-06 NOTE — Telephone Encounter (Signed)
Patient notified and verbalized understanding. 

## 2016-12-07 ENCOUNTER — Ambulatory Visit: Payer: Self-pay | Admitting: Family Medicine

## 2016-12-07 ENCOUNTER — Ambulatory Visit (INDEPENDENT_AMBULATORY_CARE_PROVIDER_SITE_OTHER): Payer: Medicare Other | Admitting: Family Medicine

## 2016-12-07 ENCOUNTER — Encounter: Payer: Self-pay | Admitting: Family Medicine

## 2016-12-07 VITALS — BP 105/68 | HR 71 | Temp 99.0°F | Resp 20 | Wt 107.5 lb

## 2016-12-07 DIAGNOSIS — M2170 Unequal limb length (acquired), unspecified site: Secondary | ICD-10-CM

## 2016-12-07 DIAGNOSIS — M5431 Sciatica, right side: Secondary | ICD-10-CM

## 2016-12-07 DIAGNOSIS — M9901 Segmental and somatic dysfunction of cervical region: Secondary | ICD-10-CM | POA: Insufficient documentation

## 2016-12-07 DIAGNOSIS — M2061 Acquired deformities of toe(s), unspecified, right foot: Secondary | ICD-10-CM

## 2016-12-07 DIAGNOSIS — M533 Sacrococcygeal disorders, not elsewhere classified: Secondary | ICD-10-CM | POA: Insufficient documentation

## 2016-12-07 DIAGNOSIS — M9905 Segmental and somatic dysfunction of pelvic region: Secondary | ICD-10-CM

## 2016-12-07 DIAGNOSIS — L84 Corns and callosities: Secondary | ICD-10-CM

## 2016-12-07 NOTE — Patient Instructions (Signed)
Sciatica Rehab  Ask your health care provider which exercises are safe for you. Do exercises exactly as told by your health care provider and adjust them as directed. It is normal to feel mild stretching, pulling, tightness, or discomfort as you do these exercises, but you should stop right away if you feel sudden pain or your pain gets worse. Do not begin these exercises until told by your health care provider.  Stretching and range of motion exercises  These exercises warm up your muscles and joints and improve the movement and flexibility of your hips and your back. These exercises also help to relieve pain, numbness, and tingling.  Exercise A: Sciatic nerve glide  1. Sit in a chair with your head facing down toward your chest. Place your hands behind your back. Let your shoulders slump forward.  2. Slowly straighten one of your knees while you tilt your head back as if you are looking toward the ceiling. Only straighten your leg as far as you can without making your symptoms worse.  3. Hold for __________ seconds.  4. Slowly return to the starting position.  5. Repeat with your other leg.  Repeat __________ times. Complete this exercise __________ times a day.  Exercise B: Knee to chest with hip adduction and internal rotation    1. Lie on your back on a firm surface with both legs straight.  2. Bend one of your knees and move it up toward your chest until you feel a gentle stretch in your lower back and buttock. Then, move your knee toward the shoulder that is on the opposite side from your leg.  ? Hold your leg in this position by holding onto the front of your knee.  3. Hold for __________ seconds.  4. Slowly return to the starting position.  5. Repeat with your other leg.  Repeat __________ times. Complete this exercise __________ times a day.  Exercise C: Prone extension on elbows    1. Lie on your abdomen on a firm surface. A bed may be too soft for this exercise.  2. Prop yourself up on your  elbows.  3. Use your arms to help lift your chest up until you feel a gentle stretch in your abdomen and your lower back.  ? This will place some of your body weight on your elbows. If this is uncomfortable, try stacking pillows under your chest.  ? Your hips should stay down, against the surface that you are lying on. Keep your hip and back muscles relaxed.  4. Hold for __________ seconds.  5. Slowly relax your upper body and return to the starting position.  Repeat __________ times. Complete this exercise __________ times a day.  Strengthening exercises  These exercises build strength and endurance in your back. Endurance is the ability to use your muscles for a long time, even after they get tired.  Exercise D: Pelvic tilt  1. Lie on your back on a firm surface. Bend your knees and keep your feet flat.  2. Tense your abdominal muscles. Tip your pelvis up toward the ceiling and flatten your lower back into the floor.  ? To help with this exercise, you may place a small towel under your lower back and try to push your back into the towel.  3. Hold for __________ seconds.  4. Let your muscles relax completely before you repeat this exercise.  Repeat __________ times. Complete this exercise __________ times a day.  Exercise E: Alternating arm and leg raises      1. Get on your hands and knees on a firm surface. If you are on a hard floor, you may want to use padding to cushion your knees, such as an exercise mat.  2. Line up your arms and legs. Your hands should be below your shoulders, and your knees should be below your hips.  3. Lift your left leg behind you. At the same time, raise your right arm and straighten it in front of you.  ? Do not lift your leg higher than your hip.  ? Do not lift your arm higher than your shoulder.  ? Keep your abdominal and back muscles tight.  ? Keep your hips facing the ground.  ? Do not arch your back.  ? Keep your balance carefully, and do not hold your breath.  4. Hold for  __________ seconds.  5. Slowly return to the starting position and repeat with your right leg and your left arm.  Repeat __________ times. Complete this exercise __________ times a day.  Posture and body mechanics    Body mechanics refers to the movements and positions of your body while you do your daily activities. Posture is part of body mechanics. Good posture and healthy body mechanics can help to relieve stress in your body's tissues and joints. Good posture means that your spine is in its natural S-curve position (your spine is neutral), your shoulders are pulled back slightly, and your head is not tipped forward. The following are general guidelines for applying improved posture and body mechanics to your everyday activities.  Standing    · When standing, keep your spine neutral and your feet about hip-width apart. Keep a slight bend in your knees. Your ears, shoulders, and hips should line up.  · When you do a task in which you stand in one place for a long time, place one foot up on a stable object that is 2-4 inches (5-10 cm) high, such as a footstool. This helps keep your spine neutral.  Sitting    · When sitting, keep your spine neutral and keep your feet flat on the floor. Use a footrest, if necessary, and keep your thighs parallel to the floor. Avoid rounding your shoulders, and avoid tilting your head forward.  · When working at a desk or a computer, keep your desk at a height where your hands are slightly lower than your elbows. Slide your chair under your desk so you are close enough to maintain good posture.  · When working at a computer, place your monitor at a height where you are looking straight ahead and you do not have to tilt your head forward or downward to look at the screen.  Resting    · When lying down and resting, avoid positions that are most painful for you.  · If you have pain with activities such as sitting, bending, stooping, or squatting (flexion-based activities), lie in a  position in which your body does not bend very much. For example, avoid curling up on your side with your arms and knees near your chest (fetal position).  · If you have pain with activities such as standing for a long time or reaching with your arms (extension-based activities), lie with your spine in a neutral position and bend your knees slightly. Try the following positions:  ? Lying on your side with a pillow between your knees.  ? Lying on your back with a pillow under your knees.  Lifting    · When lifting   objects, keep your feet at least shoulder-width apart and tighten your abdominal muscles.  · Bend your knees and hips and keep your spine neutral. It is important to lift using the strength of your legs, not your back. Do not lock your knees straight out.  · Always ask for help to lift heavy or awkward objects.  This information is not intended to replace advice given to you by your health care provider. Make sure you discuss any questions you have with your health care provider.  Document Released: 06/25/2005 Document Revised: 03/01/2016 Document Reviewed: 03/11/2015  [employer]vier Interactive Patient Education © 2018 [employer]

## 2016-12-07 NOTE — Progress Notes (Signed)
Kelsey [name] , Aug 23, 1945, 71 y.o., female MRN: [id number] Patient Care Team    Relationship Specialty Notifications Start End  [name], DO PCP - General Family Medicine  05/27/15   Delsa Bern, MD Consulting Physician Obstetrics and Gynecology  12/08/15   [name], MD Consulting Physician Gastroenterology  12/08/15   Calvert Cantor, MD Consulting Physician Ophthalmology  12/08/15    Chief Complaint  Patient presents with  . Back Pain    wants referral  . Chest Pain    chronic on and off    Subjective:  Neck and Back pain: Patient presents today to discuss her chronic low back pain. She states she has been seeing a chiropractor that goes by the name of Dr. Jaci Standard and works for [employer]. She started with this group approximately 4 weeks ago, and brings the treatment plan with her today. Essentially it is a program that is going to attempt to treat muscle skeletal pain in a multidisciplinary fashion, over 4-1/2 months, costing her $1900. She states she has reservations after starting the program, and she has not yet had to pay the fee, that but that comes at her next visit. We had discussed OMT in the past, and she is interested in referral today. She states she still has right low back pain, mostly points over her right SI joint and right hip. The pain is worse when transitioning from sitting to standing. She also endorses increased pain that radiates from her hip just below her right buttock over the last week after increased activity. Patient also sustained a cervical strain a few months ago, and states every once in a while her neck will bother her. She states it is much better she has gone through physical therapy for that injury, but she would like that to also be possibly addressed during referral. She states the place where she had her workup would not release her x-rays, but did allow her to take pictures of them which she shows on her phone today.  Toe pain:  Patient states that she has had toe pain for a few months. She knows her toe became red and swollen, so she thought it was something that she could "pop ". She felt there was fluid collection beneath the skin so she stuck a needle into the area after sanitizing, and did not express any fluid. Since that time. Continues to become more swollen and now hurts when attempting to wear a shoe. She denies fevers, chills drainage. Prior note: Pt presents for an acute OV with complaints of right sided low back pain of 2 weeks duration.  Patient points to her right sacroiliac area and states that she has been experiencing pain for the past 2 weeks in this location, and the last week she has experienced more frequent sharp pain in this area. She states intermittently she feels like when she tries to stand she feels like her back and leg are "disconnected" and feels like her leg is going to give out on her. She states the pain becomes worse when transferring positions from standing to sitting and getting out of bed in the morning. She feels better when she is standing. The pain does not radiate. She reports over Christmas time she tried to get in the hot tub and perform some stretches, which she felt initially helped, but now the pain is worse again. She has not tried any medications. He has had no prior injuries or recent increase in activity.  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    Allergies  Allergen Reactions  . Penicillins Rash    Has patient had a PCN reaction causing immediate rash, facial/tongue/throat swelling, SOB or lightheadedness with hypotension: no Has patient had a PCN reaction causing severe rash involving mucus membranes or skin necrosis: yes Has patient had a PCN reaction that required hospitalization: no Has patient had a PCN reaction occurring within the last 10 years: no If all of the above answers are "NO", then may proceed with Cephalosporin use.   . Sulfa  Antibiotics Rash    Under arm and thighs   Social History  Substance Use Topics  . Smoking status: Former Smoker    Packs/day: 0.30    Years: 10.00    Types: Cigarettes    Quit date: 07/09/1978  . Smokeless tobacco: Never Used  . Alcohol use Yes     Comment: 1-2 glasses of wine daily and a 12 oz beer daily   Past Medical History:  Diagnosis Date  . Allergy   . Anemia    iron deficiency  . 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 Bladder prolapse, female, acquired   . Chicken pox as a child  . Dysuria 09/13/2011  . Female bladder prolapse 09/13/2011  . Fibrocystic breast 09/14/2011  . GERD (gastroesophageal reflux disease)   . H/O measles   . H/O mumps   . Hyperlipidemia   . 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 Osteoarthrosis    right shoulder  . Osteoporosis    reclast in Oct  . Ovarian cyst 09/14/2011  . [facility] spotted fever) 2017  . Uterine fibroid 11/2012   See Dr. Cletis Media note, surgery 11/2012  . Vaginitis 09/13/2011   Past Surgical History:  Procedure Laterality Date  . DILATATION & CURRETTAGE/HYSTEROSCOPY WITH RESECTOCOPE N/A 11/06/2012   Procedure: DILATATION & CURETTAGE/HYSTEROSCOPY WITH RESECTOCOPE;  Surgeon: Alwyn Pea, MD;  Location: [facility];  Service: Gynecology;  Laterality: N/A;  . HERNIA REPAIR  1992  . small intestine hernia  1994  . WISDOM TOOTH EXTRACTION     Family History  Problem Relation Age of Onset  . Heart disease Mother   . Osteoporosis Mother   . Cancer Father        prostate  . Osteoporosis Sister   . Gout Brother   . Gout Maternal Grandmother   . Heart disease Maternal Grandmother   . Alzheimer's disease Maternal Grandfather   . Osteoporosis Sister    Allergies as of 12/07/2016      Reactions   Penicillins Rash   Has patient had a PCN reaction causing immediate rash, facial/tongue/throat swelling, SOB or lightheadedness with hypotension: no Has patient had a PCN reaction causing severe rash involving mucus membranes or skin necrosis:  yes Has patient had a PCN reaction that required hospitalization: no Has patient had a PCN reaction occurring within the last 10 years: no If all of the above answers are "NO", then may proceed with Cephalosporin use.   Sulfa Antibiotics Rash   Under arm and thighs      Medication List       Accurate as of 12/07/16  2:07 PM. Always use your most recent med list.          Biotin 10 MG Tabs Take 1 tablet by mouth daily.   CALCIUM + D3 PO Take 300 mg by mouth daily.   Co Q 10 100 MG Caps Take 1 tablet by  mouth daily.   ESTRING 2 MG vaginal ring Generic drug:  estradiol Place 2 mg vaginally every 3 (three) months.   Fish Oil 1000 MG Caps Take 1 capsule by mouth daily.   Glucosamine 750 MG Tabs Take by mouth daily.   MAGNESIA PO Take 1 tablet by mouth daily. OTC   naproxen 500 MG tablet Commonly known as:  NAPROSYN Take 1 tablet (500 mg total) by mouth 2 (two) times daily with a meal.   omeprazole 20 MG capsule Commonly known as:  PRILOSEC TAKE 1 CAPSULE DAILY   Vitamin D-3 1000 units Caps Take 4,000 Units by mouth.       No results found for this or any previous visit (from the past 24 hour(s)). No results found.   ROS: Negative, with the exception of above mentioned in HPI   Objective:  BP 105/68 (BP Location: Right Arm, Patient Position: Sitting, Cuff Size: Normal)   Pulse 71   Temp 99 F (37.2 C)   Resp 20   Wt 107 lb 8 oz (48.8 kg)   SpO2 98%   BMI 17.89 kg/m  Body mass index is 17.89 kg/m.  Gen: Afebrile. No acute distress.  Eyes:Pupils Equal Round Reactive to light, Extraocular movements intact,  Conjunctiva without redness, discharge or icterus. MSK:  - Cervical spine: No erythema, no soft tissue swelling. No bony tenderness. Full range of motion. Cervical straightening present. Neurovascularly intact distally. - Lumbar spine/pelvis: No erythema, no soft tissue swelling, no tenderness to bony processes. No step-off noted. Tender to palpation  right SI joint. Tender to palpation right piriformis. Positive Fabre right. Negative straight leg raises bilaterally. Positive flexion test right for restriction. Skin: Left second distal toe joint with nodule, red area. No tenderness to palpation.  No drainage. no rashes, purpura or petechiae.  Neuro: Normal gait. PERLA. EOMi. Alert. Oriented x3. 5/5 bilateral lower extremity strength. Neurovascularly intact distally.   Assessment/Plan: REESA GOTSCHALL is a 71 y.o. female present for acute OV for  Leg length discrepancy - Known history of left length discrepancy, left leg longer than right. - Ambulatory referral to Sports Medicine  SI (sacroiliac) joint dysfunction Sciatica of right side Cervical somatic dysfunction Pelvic somatic dysfunction - Patient still has mild cervical straightening, could benefit from OMT. She has been very good about going to physical therapy, and still performing exercises within the home. - Her now chronic right SI/pelvic  Dysfunction would certainly benefit from OMT. She does have a leg length discrepancy that may be contributing to her condition. She was provided with stretches to perform at home. - She is a very active 71 year old, very thin [place] female. She is extremely active, yoga, Pilates, yardwork and taking care of her husband which needs her full assistance secondary to illness. - Discussed NSAIDs, ice/heat therapy. - Ambulatory referral to Sports Medicine for OMT.  Callus of foot Acquired deformity of right toe - New - Discussed with patient area seems to be either consistent with a cyst or possibly warty etiology. The nodule is becoming larger. Discussed options with her today, and I feel would be most appropriate to send her to podiatry for further evaluation and treatment given nodule is becoming larger and may need more of a minor surgical treatment to completely resolve. She is agreeable to referral today. - Ambulatory referral to  Podiatry  > 25 minutes spent with patient, >50% of time spent face to face counseling and/or coordinating care. Patient does speak English well, but there is  some difficulty with communication and extra time is needed with patient for explanation of conditions and treatment.   electronically signed by:  Howard Pouch, DO  [place]

## 2016-12-17 ENCOUNTER — Ambulatory Visit: Payer: Medicare Other

## 2016-12-17 VITALS — BP 106/60 | HR 93 | Ht 65.0 in | Wt 107.0 lb

## 2016-12-17 DIAGNOSIS — Z23 Encounter for immunization: Secondary | ICD-10-CM

## 2016-12-17 DIAGNOSIS — Z0000 Encounter for general adult medical examination without abnormal findings: Secondary | ICD-10-CM

## 2016-12-17 DIAGNOSIS — R94120 Abnormal auditory function study: Secondary | ICD-10-CM

## 2016-12-17 MED ORDER — ZOSTER VAC RECOMB ADJUVANTED 50 MCG/0.5ML IM SUSR: 0.5000 mL | Freq: Once | INTRAMUSCULAR | 1 refills | Status: AC

## 2016-12-17 NOTE — Progress Notes (Signed)
Subjective:   Kelsey [name] is a 71 y.o. female who presents for Medicare Annual (Subsequent) preventive examination.  Review of Systems:  No ROS.  Medicare Wellness Visit. Cardiac Risk Factors include: advanced age (>45men, >23 women);dyslipidemia;family history of premature cardiovascular disease   Sleep patterns: Sleeps 7 hours, feels rested.  Home Safety/Smoke Alarms: Feels safe in home. Smoke alarms in place.  Living environment; residence and Firearm Safety: Lives with husband in 1 story with basement, rail at steps.  Seat Belt Safety/Bike Helmet: Wears seat belt.   Counseling:   Eye Exam-Last exam < 6 months, yearly Dr. Bing Plume.  Dental-Last exam < 1 year, every 6 months Dr. Jarrett Soho  Female:   Pap-10/2015.      Mammo-Pt reports < 6 months at [place], will call for report. Requesting [place] location next time.  Dexa scan-05/16/2015, Osteoporosis.         [facility]S-Colonoscopy 01/06/2014, normal . Recall 10 years.      Objective:     Vitals: BP 106/60 (BP Location: Left Arm, Patient Position: Sitting, Cuff Size: Normal)   Pulse 93   Ht 5\' 5"  (1.651 m)   Wt 107 lb (48.5 kg)   SpO2 96%   BMI 17.81 kg/m   Body mass index is 17.81 kg/m.   Tobacco History  Smoking Status  . Former Smoker  . Packs/day: 0.30  . Years: 10.00  . Types: Cigarettes  . Quit date: 07/09/1978  Smokeless Tobacco  . Never Used     Counseling given: Not Answered   Past Medical History:  Diagnosis Date  . Allergy   . Anemia    iron deficiency  . 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 Bladder prolapse, female, acquired   . Chicken pox as a child  . Dysuria 09/13/2011  . Female bladder prolapse 09/13/2011  . Fibrocystic breast 09/14/2011  . GERD (gastroesophageal reflux disease)   . H/O measles   . H/O mumps   . Hyperlipidemia   . 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 Osteoarthrosis    right shoulder  . Osteoporosis    reclast in Oct  . Ovarian cyst 09/14/2011  . [facility] spotted  fever) 2017  . Uterine fibroid 11/2012   See Dr. Cletis Media note, surgery 11/2012  . Vaginitis 09/13/2011   Past Surgical History:  Procedure Laterality Date  . DILATATION & CURRETTAGE/HYSTEROSCOPY WITH RESECTOCOPE N/A 11/06/2012   Procedure: DILATATION & CURETTAGE/HYSTEROSCOPY WITH RESECTOCOPE;  Surgeon: Alwyn Pea, MD;  Location: [facility];  Service: Gynecology;  Laterality: N/A;  . HERNIA REPAIR  1992  . small intestine hernia  1994  . WISDOM TOOTH EXTRACTION     Family History  Problem Relation Age of Onset  . Heart disease Mother   . Osteoporosis Mother   . Cancer Father        prostate  . Osteoporosis Sister   . Gout Brother   . Gout Maternal Grandmother   . Heart disease Maternal Grandmother   . Alzheimer's disease Maternal Grandfather   . Osteoporosis Sister    History  Sexual Activity  . Sexual activity: No    Outpatient Encounter Prescriptions as of 12/17/2016  Medication Sig  . Biotin 10 MG TABS Take 1 tablet by mouth daily.   . Calcium Carb-Cholecalciferol (CALCIUM + D3 PO) Take 300 mg by mouth daily.  . Cholecalciferol (VITAMIN D-3) 1000 units CAPS Take 4,000 Units by mouth.  Konrad Saha 2 MG vaginal ring Place 2 mg vaginally every 3 (three) months.  Marland Kitchen  Glucosamine 750 MG TABS Take by mouth daily.   . Magnesium Hydroxide (MAGNESIA PO) Take 1 tablet by mouth daily. OTC  . naproxen (NAPROSYN) 500 MG tablet Take 1 tablet (500 mg total) by mouth 2 (two) times daily with a meal.  . Omega-3 Fatty Acids (FISH OIL) 1000 MG CAPS Take 1 capsule by mouth daily.  Marland Kitchen omeprazole (PRILOSEC) 20 MG capsule TAKE 1 CAPSULE DAILY  . Coenzyme Q10 (CO Q 10) 100 MG CAPS Take 1 tablet by mouth daily.   Facility-Administered Encounter Medications as of 12/17/2016  Medication  . denosumab (PROLIA) injection 60 mg    Activities of Daily Living In your present state of health, do you have any difficulty performing the following activities: 12/17/2016 12/17/2016  Hearing? N N  Vision? N N    Difficulty concentrating or making decisions? N -  Walking or climbing stairs? N -  Dressing or bathing? N -  Doing errands, shopping? N -  Preparing Food and eating ? N -  Using the Toilet? N -  In the past six months, have you accidently leaked urine? N -  Do you have problems with loss of bowel control? N -  Managing your Medications? N -  Managing your Finances? N -  Housekeeping or managing your Housekeeping? N -  Some recent data might be hidden    Patient Care Team: [name], DO as PCP - General (Family Medicine) [name], MD as Consulting Physician (Gastroenterology) Calvert Cantor, MD as Consulting Physician (Ophthalmology)    Assessment:    Physical assessment deferred to PCP.  Exercise Activities and Dietary recommendations Current Exercise Habits: Structured exercise class (Maintaining house and caring for husband), Type of exercise: yoga;Other - see comments (pilates), Frequency (Times/Week): 1, Exercise limited by: None identified   Diet (meal preparation, eat out, water intake, caffeinated beverages, dairy products, fruits and vegetables): Drinks water.   Breakfast: toast, pb, banana, fruit, yogurt Lunch: rice, bamboo, chicken, mushroom Dinner: salad, vegetable, lean protein  Discussed heart healthy diet and activity.   Goals      Patient Stated   . <enter goal here> (pt-stated)          Increase coordination, balance and alignment.       Fall Risk Fall Risk  12/17/2016 12/07/2016 12/08/2015 12/02/2013  Falls in the past year? No No No Yes  Number falls in past yr: - - - 1  Injury with Fall? - - - Yes  Risk for fall due to : - - - Other (Comment)  Risk for fall due to (comments): - - - Dog over balanced her   Depression Screen PHQ 2/9 Scores 12/17/2016 12/07/2016 12/08/2015 12/02/2013  PHQ - 2 Score 0 0 0 1     Cognitive Function       Ad8 score reviewed for issues:  Issues making decisions: no  Less interest in hobbies / activities:  no  Repeats questions, stories (family complaining): no  Trouble using ordinary gadgets (microwave, computer, phone): no  Forgets the month or year: no  Mismanaging finances: no  Remembering appts: no  Daily problems with thinking and/or memory: no Ad8 score is= 0     Immunization History  Administered Date(s) Administered  . Hepatitis A, Adult 03/23/2014  . Influenza Whole 04/09/2011  . Influenza, High Dose Seasonal PF 05/27/2015, 04/12/2016  . Influenza,inj,Quad PF,36+ Mos 03/19/2014  . Pneumococcal Conjugate-13 12/02/2013, 04/02/2014  . Pneumococcal Polysaccharide-23 12/08/2015  . Tdap 12/02/2013   Screening Tests Health  Maintenance  Topic Date Due  . INFLUENZA VACCINE  02/06/2017  . DEXA SCAN  05/15/2017  . MAMMOGRAM  05/16/2017  . TETANUS/TDAP  12/03/2023  . COLONOSCOPY  01/07/2024  . Hepatitis C Screening  Completed  . PNA vac Low Risk Adult  Completed      Plan:    Bring a copy of your advance directives to your next office visit.  Continue doing brain stimulating activities (puzzles, reading, adult coloring books, staying active) to keep memory sharp.   Call pharmacy regarding Shingrix.   I have personally reviewed and noted the following in the patient's chart:   . Medical and social history . Use of alcohol, tobacco or illicit drugs  . Current medications and supplements . Functional ability and status . Nutritional status . Physical activity . Advanced directives . List of other physicians . Hospitalizations, surgeries, and ER visits in previous 12 months . Vitals . Screenings to include cognitive, depression, and falls . Referrals and appointments  In addition, I have reviewed and discussed with patient certain preventive protocols, quality metrics, and best practice recommendations. A written personalized care plan for preventive services as well as general preventive health recommendations were provided to patient.     Gerilyn Nestle, RN  12/17/2016  PCP Note: -Shingrix Rx sent to pharmacy -Audiology referral placed for failed hearing screen -Has appt with Podiatry for cyst/wart on toe, plans to discuss need for arch support. Juluis Rainier)   Medical screening examination/treatment/procedure(s) were performed by non-physician practitioner and as supervising physician I was immediately available for consultation/collaboration.  I agree with above assessment and plan.  Electronically Signed by: Howard Pouch, DO [facility]

## 2016-12-17 NOTE — Patient Instructions (Addendum)
Bring a copy of your advance directives to your next office visit.  Continue doing brain stimulating activities (puzzles, reading, adult coloring books, staying active) to keep memory sharp.   Call pharmacy regarding Shingrix.  Fall Prevention in the Home Falls can cause injuries. They can happen to people of all ages. There are many things you can do to make your home safe and to help prevent falls. What can I do on the outside of my home?  Regularly fix the edges of walkways and driveways and fix any cracks.  Remove anything that might make you trip as you walk through a door, such as a raised step or threshold.  Trim any bushes or trees on the path to your home.  Use bright outdoor lighting.  Clear any walking paths of anything that might make someone trip, such as rocks or tools.  Regularly check to see if handrails are loose or broken. Make sure that both sides of any steps have handrails.  Any raised decks and porches should have guardrails on the edges.  Have any leaves, snow, or ice cleared regularly.  Use sand or salt on walking paths during winter.  Clean up any spills in your garage right away. This includes oil or grease spills. What can I do in the bathroom?  Use night lights.  Install grab bars by the toilet and in the tub and shower. Do not use towel bars as grab bars.  Use non-skid mats or decals in the tub or shower.  If you need to sit down in the shower, use a plastic, non-slip stool.  Keep the floor dry. Clean up any water that spills on the floor as soon as it happens.  Remove soap buildup in the tub or shower regularly.  Attach bath mats securely with double-sided non-slip rug tape.  Do not have throw rugs and other things on the floor that can make you trip. What can I do in the bedroom?  Use night lights.  Make sure that you have a light by your bed that is easy to reach.  Do not use any sheets or blankets that are too big for your bed. They  should not hang down onto the floor.  Have a firm chair that has side arms. You can use this for support while you get dressed.  Do not have throw rugs and other things on the floor that can make you trip. What can I do in the kitchen?  Clean up any spills right away.  Avoid walking on wet floors.  Keep items that you use a lot in easy-to-reach places.  If you need to reach something above you, use a strong step stool that has a grab bar.  Keep electrical cords out of the way.  Do not use floor polish or wax that makes floors slippery. If you must use wax, use non-skid floor wax.  Do not have throw rugs and other things on the floor that can make you trip. What can I do with my stairs?  Do not leave any items on the stairs.  Make sure that there are handrails on both sides of the stairs and use them. Fix handrails that are broken or loose. Make sure that handrails are as long as the stairways.  Check any carpeting to make sure that it is firmly attached to the stairs. Fix any carpet that is loose or worn.  Avoid having throw rugs at the top or bottom of the stairs. If you  do have throw rugs, attach them to the floor with carpet tape.  Make sure that you have a light switch at the top of the stairs and the bottom of the stairs. If you do not have them, ask someone to add them for you. What else can I do to help prevent falls?  Wear shoes that: ? Do not have high heels. ? Have rubber bottoms. ? Are comfortable and fit you well. ? Are closed at the toe. Do not wear sandals.  If you use a stepladder: ? Make sure that it is fully opened. Do not climb a closed stepladder. ? Make sure that both sides of the stepladder are locked into place. ? Ask someone to hold it for you, if possible.  Clearly mark and make sure that you can see: ? Any grab bars or handrails. ? First and last steps. ? Where the edge of each step is.  Use tools that help you move around (mobility aids) if  they are needed. These include: ? Canes. ? Walkers. ? Scooters. ? Crutches.  Turn on the lights when you go into a dark area. Replace any light bulbs as soon as they burn out.  Set up your furniture so you have a clear path. Avoid moving your furniture around.  If any of your floors are uneven, fix them.  If there are any pets around you, be aware of where they are.  Review your medicines with your doctor. Some medicines can make you feel dizzy. This can increase your chance of falling. Ask your doctor what other things that you can do to help prevent falls. This information is not intended to replace advice given to you by your health care provider. Make sure you discuss any questions you have with your health care provider. Document Released: 04/21/2009 Document Revised: 12/01/2015 Document Reviewed: 07/30/2014 Elsevier Interactive Patient Education  2018 [place] Maintenance, Female Adopting a healthy lifestyle and getting preventive care can go a long way to promote health and wellness. Talk with your health care provider about what schedule of regular examinations is right for you. This is a good chance for you to check in with your provider about disease prevention and staying healthy. In between checkups, there are plenty of things you can do on your own. Experts have done a lot of research about which lifestyle changes and preventive measures are most likely to keep you healthy. Ask your health care provider for more information. Weight and diet Eat a healthy diet  Be sure to include plenty of vegetables, fruits, low-fat dairy products, and lean protein.  Do not eat a lot of foods high in solid fats, added sugars, or salt.  Get regular exercise. This is one of the most important things you can do for your health. ? Most adults should exercise for at least 150 minutes each week. The exercise should increase your heart rate and make you sweat (moderate-intensity  exercise). ? Most adults should also do strengthening exercises at least twice a week. This is in addition to the moderate-intensity exercise.  Maintain a healthy weight  Body mass index (BMI) is a measurement that can be used to identify possible weight problems. It estimates body fat based on height and weight. Your health care provider can help determine your BMI and help you achieve or maintain a healthy weight.  For females 63 years of age and older: ? A BMI below 18.5 is considered underweight. ? A BMI of 18.5  to 24.9 is normal. ? A BMI of 25 to 29.9 is considered overweight. ? A BMI of 30 and above is considered obese.  Watch levels of cholesterol and blood lipids  You should start having your blood tested for lipids and cholesterol at 71 years of age, then have this test every 5 years.  You may need to have your cholesterol levels checked more often if: ? Your lipid or cholesterol levels are high. ? You are older than 71 years of age. ? You are at high risk for heart disease.  Cancer screening Lung Cancer  Lung cancer screening is recommended for adults 37-49 years old who are at high risk for lung cancer because of a history of smoking.  A yearly low-dose CT scan of the lungs is recommended for people who: ? Currently smoke. ? Have quit within the past 15 years. ? Have at least a 30-pack-year history of smoking. A pack year is smoking an average of one pack of cigarettes a day for 1 year.  Yearly screening should continue until it has been 15 years since you quit.  Yearly screening should stop if you develop a health problem that would prevent you from having lung cancer treatment.  Breast Cancer  Practice breast self-awareness. This means understanding how your breasts normally appear and feel.  It also means doing regular breast self-exams. Let your health care provider know about any changes, no matter how small.  If you are in your 20s or 30s, you should have a  clinical breast exam (CBE) by a health care provider every 1-3 years as part of a regular health exam.  If you are 77 or older, have a CBE every year. Also consider having a breast X-ray (mammogram) every year.  If you have a family history of breast cancer, talk to your health care provider about genetic screening.  If you are at high risk for breast cancer, talk to your health care provider about having an MRI and a mammogram every year.  Breast cancer gene (BRCA) assessment is recommended for women who have family members with BRCA-related cancers. BRCA-related cancers include: ? Breast. ? Ovarian. ? Tubal. ? Peritoneal cancers.  Results of the assessment will determine the need for genetic counseling and BRCA1 and BRCA2 testing.  Cervical Cancer Your health care provider may recommend that you be screened regularly for cancer of the pelvic organs (ovaries, uterus, and vagina). This screening involves a pelvic examination, including checking for microscopic changes to the surface of your cervix (Pap test). You may be encouraged to have this screening done every 3 years, beginning at age 63.  For women ages 34-65, health care providers may recommend pelvic exams and Pap testing every 3 years, or they may recommend the Pap and pelvic exam, combined with testing for human papilloma virus (HPV), every 5 years. Some types of HPV increase your risk of cervical cancer. Testing for HPV may also be done on women of any age with unclear Pap test results.  Other health care providers may not recommend any screening for nonpregnant women who are considered low risk for pelvic cancer and who do not have symptoms. Ask your health care provider if a screening pelvic exam is right for you.  If you have had past treatment for cervical cancer or a condition that could lead to cancer, you need Pap tests and screening for cancer for at least 20 years after your treatment. If Pap tests have been discontinued,  your risk  factors (such as having a new sexual partner) need to be reassessed to determine if screening should resume. Some women have medical problems that increase the chance of getting cervical cancer. In these cases, your health care provider may recommend more frequent screening and Pap tests.  Colorectal Cancer  This type of cancer can be detected and often prevented.  Routine colorectal cancer screening usually begins at 71 years of age and continues through 71 years of age.  Your health care provider may recommend screening at an earlier age if you have risk factors for colon cancer.  Your health care provider may also recommend using home test kits to check for hidden blood in the stool.  A small camera at the end of a tube can be used to examine your colon directly (sigmoidoscopy or colonoscopy). This is done to check for the earliest forms of colorectal cancer.  Routine screening usually begins at age 23.  Direct examination of the colon should be repeated every 5-10 years through 71 years of age. However, you may need to be screened more often if early forms of precancerous polyps or small growths are found.  Skin Cancer  Check your skin from head to toe regularly.  Tell your health care provider about any new moles or changes in moles, especially if there is a change in a mole's shape or color.  Also tell your health care provider if you have a mole that is larger than the size of a pencil eraser.  Always use sunscreen. Apply sunscreen liberally and repeatedly throughout the day.  Protect yourself by wearing long sleeves, pants, a wide-brimmed hat, and sunglasses whenever you are outside.  Heart disease, diabetes, and high blood pressure  High blood pressure causes heart disease and increases the risk of stroke. High blood pressure is more likely to develop in: ? People who have blood pressure in the high end of the normal range (130-139/85-89 mm Hg). ? People who are  overweight or obese. ? People who are African American.  If you are 62-39 years of age, have your blood pressure checked every 3-5 years. If you are 1 years of age or older, have your blood pressure checked every year. You should have your blood pressure measured twice-once when you are at a hospital or clinic, and once when you are not at a hospital or clinic. Record the average of the two measurements. To check your blood pressure when you are not at a hospital or clinic, you can use: ? An automated blood pressure machine at a pharmacy. ? A home blood pressure monitor.  If you are between 8 years and 54 years old, ask your health care provider if you should take aspirin to prevent strokes.  Have regular diabetes screenings. This involves taking a blood sample to check your fasting blood sugar level. ? If you are at a normal weight and have a low risk for diabetes, have this test once every three years after 71 years of age. ? If you are overweight and have a high risk for diabetes, consider being tested at a younger age or more often. Preventing infection Hepatitis B  If you have a higher risk for hepatitis B, you should be screened for this virus. You are considered at high risk for hepatitis B if: ? You were born in a country where hepatitis B is common. Ask your health care provider which countries are considered high risk. ? Your parents were born in a high-risk country, and  you have not been immunized against hepatitis B (hepatitis B vaccine). ? You have HIV or AIDS. ? You use needles to inject street drugs. ? You live with someone who has hepatitis B. ? You have had sex with someone who has hepatitis B. ? You get hemodialysis treatment. ? You take certain medicines for conditions, including cancer, organ transplantation, and autoimmune conditions.  Hepatitis C  Blood testing is recommended for: ? Everyone born from [birth year] through 1965. ? Anyone with known risk factors for  hepatitis C.  Sexually transmitted infections (STIs)  You should be screened for sexually transmitted infections (STIs) including gonorrhea and chlamydia if: ? You are sexually active and are younger than 71 years of age. ? You are older than 71 years of age and your health care provider tells you that you are at risk for this type of infection. ? Your sexual activity has changed since you were last screened and you are at an increased risk for chlamydia or gonorrhea. Ask your health care provider if you are at risk.  If you do not have HIV, but are at risk, it may be recommended that you take a prescription medicine daily to prevent HIV infection. This is called pre-exposure prophylaxis (PrEP). You are considered at risk if: ? You are sexually active and do not regularly use condoms or know the HIV status of your partner(s). ? You take drugs by injection. ? You are sexually active with a partner who has HIV.  Talk with your health care provider about whether you are at high risk of being infected with HIV. If you choose to begin PrEP, you should first be tested for HIV. You should then be tested every 3 months for as long as you are taking PrEP. Pregnancy  If you are premenopausal and you may become pregnant, ask your health care provider about preconception counseling.  If you may become pregnant, take 400 to 800 micrograms (mcg) of folic acid every day.  If you want to prevent pregnancy, talk to your health care provider about birth control (contraception). Osteoporosis and menopause  Osteoporosis is a disease in which the bones lose minerals and strength with aging. This can result in serious bone fractures. Your risk for osteoporosis can be identified using a bone density scan.  If you are 65 years of age or older, or if you are at risk for osteoporosis and fractures, ask your health care provider if you should be screened.  Ask your health care provider whether you should take a  calcium or vitamin D supplement to lower your risk for osteoporosis.  Menopause may have certain physical symptoms and risks.  Hormone replacement therapy may reduce some of these symptoms and risks. Talk to your health care provider about whether hormone replacement therapy is right for you. Follow these instructions at home:  Schedule regular health, dental, and eye exams.  Stay current with your immunizations.  Do not use any tobacco products including cigarettes, chewing tobacco, or electronic cigarettes.  If you are pregnant, do not drink alcohol.  If you are breastfeeding, limit how much and how often you drink alcohol.  Limit alcohol intake to no more than 1 drink per day for nonpregnant women. One drink equals 12 ounces of beer, 5 ounces of wine, or 1 ounces of hard liquor.  Do not use street drugs.  Do not share needles.  Ask your health care provider for help if you need support or information about quitting drugs.  Tell  your health care provider if you often feel depressed.  Tell your health care provider if you have ever been abused or do not feel safe at home. This information is not intended to replace advice given to you by your health care provider. Make sure you discuss any questions you have with your health care provider. Document Released: 01/08/2011 Document Revised: 12/01/2015 Document Reviewed: 03/29/2015 Elsevier Interactive Patient Education  [employer].

## 2016-12-21 ENCOUNTER — Encounter: Payer: Self-pay | Admitting: Podiatry

## 2016-12-21 ENCOUNTER — Ambulatory Visit (INDEPENDENT_AMBULATORY_CARE_PROVIDER_SITE_OTHER): Payer: Medicare Other

## 2016-12-21 ENCOUNTER — Ambulatory Visit (INDEPENDENT_AMBULATORY_CARE_PROVIDER_SITE_OTHER): Payer: Medicare Other | Admitting: Podiatry

## 2016-12-21 VITALS — Resp 16 | Ht 66.0 in | Wt 103.0 lb

## 2016-12-21 DIAGNOSIS — M79672 Pain in left foot: Secondary | ICD-10-CM | POA: Diagnosis not present

## 2016-12-21 DIAGNOSIS — M779 Enthesopathy, unspecified: Secondary | ICD-10-CM | POA: Diagnosis not present

## 2016-12-21 DIAGNOSIS — M79671 Pain in right foot: Secondary | ICD-10-CM

## 2016-12-21 DIAGNOSIS — L723 Sebaceous cyst: Secondary | ICD-10-CM

## 2016-12-21 NOTE — Progress Notes (Signed)
   Subjective:    Patient ID: Kelsey [name], female    DOB: April 16, 1946, 71 y.o.   MRN: [id number]  HPI Chief Complaint  Patient presents with  . Painful lesion    Left foot; 2nd toe-top of toe; pt stated, "Thinks might have a blister, tried to pop with a needle, but nothing came out"  . Foot Orthotics    Wants to discuss; pt stated, "Has inserts from [facility], but wants to discuss"      Review of Systems  Musculoskeletal: Positive for arthralgias, back pain and gait problem.  All other systems reviewed and are negative.      Objective:   Physical Exam        Assessment & Plan:

## 2016-12-21 NOTE — Progress Notes (Signed)
Subjective:    Patient ID: Kelsey [name], female   DOB: 71 y.o.   MRN: [id number]   HPI patient presents with chronic lesion on the second digit left that's become painful on the toe with digital pressure and has structural changes with moderate depression of the arch bilateral and has inserts from [facility]    Review of Systems  All other systems reviewed and are negative.       Objective:  Physical Exam  Constitutional: She is oriented to person, place, and time.  Cardiovascular: Intact distal pulses.   Musculoskeletal: Normal range of motion.  Neurological: She is alert and oriented to person, place, and time.  Skin: Skin is warm.  Nursing note and vitals reviewed.  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 At this point for the left I did infiltrate the left second digit 60 mg I can Marcaine mixture I then aspirated the lesion getting out a small amount of gelatinous fluid and applied compression after small steroid injection and then went ahead and discussed new type orthotics with lift in the right heel to balance her better. She will see the head orthotist for this procedure and will bring in her [facility] orthotics at that time  X-rays indicate moderate depression of the arch with narrowing of the joint surface second digit distal left

## 2017-01-01 ENCOUNTER — Encounter: Payer: Medicare Other | Admitting: Orthotics

## 2017-01-02 ENCOUNTER — Ambulatory Visit: Payer: Self-pay | Admitting: Family Medicine

## 2017-01-03 ENCOUNTER — Ambulatory Visit (INDEPENDENT_AMBULATORY_CARE_PROVIDER_SITE_OTHER): Payer: Medicare Other | Admitting: Family Medicine

## 2017-01-03 ENCOUNTER — Encounter: Payer: Self-pay | Admitting: Family Medicine

## 2017-01-03 VITALS — BP 98/60 | HR 78 | Ht 66.0 in | Wt 106.0 lb

## 2017-01-03 DIAGNOSIS — M2170 Unequal limb length (acquired), unspecified site: Secondary | ICD-10-CM | POA: Diagnosis not present

## 2017-01-03 DIAGNOSIS — M533 Sacrococcygeal disorders, not elsewhere classified: Secondary | ICD-10-CM

## 2017-01-03 DIAGNOSIS — M999 Biomechanical lesion, unspecified: Secondary | ICD-10-CM | POA: Diagnosis not present

## 2017-01-03 DIAGNOSIS — M9901 Segmental and somatic dysfunction of cervical region: Secondary | ICD-10-CM

## 2017-01-03 NOTE — Assessment & Plan Note (Signed)
Decision today to treat with OMT was based on Physical Exam  After verbal consent patient was treated with HVLA, ME, FPR techniques in cervical, thoracic, lumbar and sacral areas  Patient tolerated the procedure well with improvement in symptoms  Patient given exercises, stretches and lifestyle modifications  See medications in patient instructions if given  Patient will follow up in 4 weeks 

## 2017-01-03 NOTE — Patient Instructions (Addendum)
Good to see you.  This is your sacroiliac joint.  Ice 20 minutes 2 times daily. Usually after activity and before bed. Exercises 3 times a week.  pennsaid pinkie amount topically 2 times daily as needed.  Continue the vitamin D Turmeric 500mg  1-2 daily for inflammation  Tart cherry extract any dose at night See me again in 4 weeks.

## 2017-01-03 NOTE — Assessment & Plan Note (Signed)
Patient will get orthotics.

## 2017-01-03 NOTE — Assessment & Plan Note (Signed)
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 Discuss about possible imaging which patient declined. We attempted some manipulation today with good resolution of pain. Topical anti-inflammatory given, icing regimen, which activities to do in which ones to avoid such as extension. We discussed over-the-counter medications. Follow-up again in 4 weeks

## 2017-01-03 NOTE — Progress Notes (Signed)
[name] [address], [place] [address] Phone: [phone] Subjective:    I'm seeing this patient by the request  of:  [name], [name], DO   CC: Back pain  AQT:[id number]  Kelsey [name] is a 71 y.o. female coming in with complaint of Back pain. Patient has had this for multiple years. Patient is seen multiple different providers over the course of time. Patient has been diagnosed with more of a leg length discrepancy. Has seen a chiropractor recently but has not gone through with more treatments because she did not have any good improvement. Patient rates the severity pain as 5 out of 10. Seems to stay localized to the right side. Patient denies any radiation down the leg. Patient states that going from a sitting to standing position he can be the worse. Significant tightness. Denies any bowel or bladder incontinence. Denies any recent weight loss. States active.     Past Medical History:  Diagnosis Date  . Allergy   . Anemia    iron deficiency  . 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 Bladder prolapse, female, acquired   . Chicken pox as a child  . Dysuria 09/13/2011  . Female bladder prolapse 09/13/2011  . Fibrocystic breast 09/14/2011  . GERD (gastroesophageal reflux disease)   . H/O measles   . H/O mumps   . Hyperlipidemia   . 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 Osteoarthrosis    right shoulder  . Osteoporosis    reclast in Oct  . Ovarian cyst 09/14/2011  . [facility] spotted fever) 2017  . Uterine fibroid 11/2012   See Dr. Cletis Media note, surgery 11/2012  . Vaginitis 09/13/2011   Past Surgical History:  Procedure Laterality Date  . DILATATION & CURRETTAGE/HYSTEROSCOPY WITH RESECTOCOPE N/A 11/06/2012   Procedure: DILATATION & CURETTAGE/HYSTEROSCOPY WITH RESECTOCOPE;  Surgeon: Alwyn Pea, MD;  Location: [facility];  Service: Gynecology;  Laterality: N/A;  . HERNIA REPAIR  1992  . small intestine hernia  1994  . WISDOM TOOTH EXTRACTION       Social History   Social History  . Marital status: Married    Spouse name: N/A  . Number of children: N/A  . Years of education: N/A   Social History Main Topics  . Smoking status: Former Smoker    Packs/day: 0.30    Years: 10.00    Types: Cigarettes    Quit date: 07/09/1978  . Smokeless tobacco: Never Used  . Alcohol use Yes     Comment: 1-2 glasses of wine daily and a 12 oz beer daily  . Drug use: No  . Sexual activity: No   Other Topics Concern  . None   Social History Narrative  . None   Allergies  Allergen Reactions  . Penicillins Rash    Has patient had a PCN reaction causing immediate rash, facial/tongue/throat swelling, SOB or lightheadedness with hypotension: no Has patient had a PCN reaction causing severe rash involving mucus membranes or skin necrosis: yes Has patient had a PCN reaction that required hospitalization: no Has patient had a PCN reaction occurring within the last 10 years: no If all of the above answers are "NO", then may proceed with Cephalosporin use.   . Sulfa Antibiotics Rash    Under arm and thighs   Family History  Problem Relation Age of Onset  . Heart disease Mother   . Osteoporosis Mother   . Cancer Father  prostate  . Osteoporosis Sister   . Gout Brother   . Gout Maternal Grandmother   . Heart disease Maternal Grandmother   . Alzheimer's disease Maternal Grandfather   . Osteoporosis Sister     Past medical history, social, surgical and family history all reviewed in electronic medical record.  No pertanent information unless stated regarding to the chief complaint.   Review of Systems:Review of systems updated and as accurate as of 01/03/17  No headache, visual changes, nausea, vomiting, diarrhea, constipation, dizziness, abdominal pain, skin rash, fevers, chills, night sweats, weight loss, swollen lymph nodes, body aches, joint swelling, chest pain, shortness of breath, mood changes. Positive muscle aches  Objective   Blood pressure 98/60, pulse 78, height 5\' 6"  (1.676 m), weight 106 lb (48.1 kg), SpO2 96 %. Systems examined below as of 01/03/17   General: No apparent distress alert and oriented x3 mood and affect normal, dressed appropriately.  HEENT: Pupils equal, extraocular movements intact  Respiratory: Patient's speak in full sentences and does not appear short of breath  Cardiovascular: No lower extremity edema, non tender, no erythema  Skin: Warm dry intact with no signs of infection or rash on extremities or on axial skeleton.  Abdomen: Soft nontender  Neuro: Cranial nerves II through XII are intact, neurovascularly intact in all extremities with 2+ DTRs and 2+ pulses.  Lymph: No lymphadenopathy of posterior or anterior cervical chain or axillae bilaterally.  Gait Antalgic gait MSK:  Non tender with full range of motion and good stability and symmetric strength and tone of shoulders, elbows, wrist, hip, knee and ankles bilaterally.  Back Exam:  Inspection: Unremarkable  Motion: Flexion 45 deg, Extension 20 deg, Side Bending to 45 deg bilaterally,  Rotation to 45 deg bilaterally  SLR laying: Negative  XSLR laying: Negative  Palpable tenderness: Tender to palpation over the right sacral iliac joint. FABER: Right-sided. Sensory change: Gross sensation intact to all lumbar and sacral dermatomes.  Reflexes: 2+ at both patellar tendons, 2+ at achilles tendons, Babinski's downgoing.  Strength at foot  Plantar-flexion: 5/5 Dorsi-flexion: 5/5 Eversion: 5/5 Inversion: 5/5  Leg strength  Quad: 5/5 Hamstring: 5/5 Hip flexor: 5/5 Hip abductors: 5/5  Leg length discrepancy noted with right leg Shorter  Osteopathic findings C6 flexed rotated and side bent left T3 extended rotated and side bent right inhaled third rib T9 extended rotated and side bent left L2 flexed rotated and side bent right Sacrum right on right  Procedure note 84696; 15 minutes spent for Therapeutic exercises as stated in above  notes.  This included exercises focusing on stretching, strengthening, with significant focus on eccentric aspects.  Sacroiliac Joint Mobilization and Rehab 1. Work on pretzel stretching, shoulder back and leg draped in front. 3-5 sets, 30 sec.. 2. hip abductor rotations. standing, hip flexion and rotation outward then inward. 3 sets, 15 reps. when can do comfortably, add ankle weights starting at 2 pounds.  3. cross over stretching - shoulder back to ground, same side leg crossover. 3-5 sets for 30 min..  4. rolling up and back knees to chest and rocking. 5. sacral tilt - 5 sets, hold for 5-10 seconds  Proper technique shown and discussed handout in great detail with ATC.  All questions were discussed and answered.     Impression and Recommendations:     This case required medical decision making of moderate complexity.      Note: This dictation was prepared with Dragon dictation along with smaller phrase technology. Any transcriptional errors  that result from this process are unintentional.

## 2017-01-10 ENCOUNTER — Ambulatory Visit (INDEPENDENT_AMBULATORY_CARE_PROVIDER_SITE_OTHER): Payer: Medicare Other | Admitting: Family Medicine

## 2017-01-10 ENCOUNTER — Encounter: Payer: Self-pay | Admitting: Family Medicine

## 2017-01-10 VITALS — BP 96/62 | HR 71 | Temp 98.7°F | Resp 20 | Wt 106.5 lb

## 2017-01-10 DIAGNOSIS — R269 Unspecified abnormalities of gait and mobility: Secondary | ICD-10-CM

## 2017-01-10 DIAGNOSIS — B029 Zoster without complications: Secondary | ICD-10-CM | POA: Diagnosis not present

## 2017-01-10 MED ORDER — VALACYCLOVIR HCL 1 G PO TABS: 1000.0000 mg | ORAL_TABLET | Freq: Two times a day (BID) | ORAL | 0 refills | Status: DC

## 2017-01-10 MED ORDER — PREDNISONE 20 MG PO TABS: ORAL_TABLET | ORAL | 0 refills | Status: DC

## 2017-01-10 NOTE — Progress Notes (Signed)
Patient was fitted for a : Comfort, cushioned, semi-rigid orthotic. The orthotic was heated and afterward the patient was in a seated position and the orthotic molded. The patient was positioned in subtalar neutral position and 10 degrees of ankle dorsiflexion in a non-weight bearing stance. After completion of molding, patient did have orthotic management which included instructions on acclimating to the orthotics, signs of ill fit as well as care for the orthotic.   The blank was ground to a stable position for weight bearing. Size: 8.5 Base: Carbon fiber Additional Posting and Padding: The following postings were fitted onto the molded orthotics to help maintain a talar neutral position - Left foot: Medial arch: 270/90, transverse arch: 250/35; Right foot: Medial arch: 270/90, transverse arch: 403/70     Silicone posting for longitudinal arch:  The patient ambulated these, and they were very comfortable and supportive.

## 2017-01-10 NOTE — Assessment & Plan Note (Signed)
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 Placed in custom orthotics today. Hopefully that will be beneficial. Will wear slowly over the course of the next several days and weeks. Follow-up again in 2-4 weeks.

## 2017-01-10 NOTE — Progress Notes (Signed)
Kelsey [name] , 1945-08-05, 71 y.o., female MRN: [id number] Patient Care Team    Relationship Specialty Notifications Start End  [name], DO PCP - General Family Medicine  05/27/15   [name], MD Consulting Physician Gastroenterology  12/08/15   Calvert Cantor, MD Consulting Physician Ophthalmology  12/08/15     Chief Complaint  Patient presents with  . Rash    abdomen and back painful     Subjective: Pt presents for an OV with complaints of rash  of 1 week duration.  Associated symptoms include itchy, burning sensation followed by a rash on her abdomen that spread to her back on the left side. She has never had shingles before, and she did not go get the shingles vaccine provided to her. She endorses fatigue, denies fever or headache.    Depression screen [facility] 2/9 12/17/2016 12/07/2016 12/08/2015 12/02/2013  Decreased Interest 0 0 0 0  Down, Depressed, Hopeless 0 0 0 1  PHQ - 2 Score 0 0 0 1    Allergies  Allergen Reactions  . Penicillins Rash    Has patient had a PCN reaction causing immediate rash, facial/tongue/throat swelling, SOB or lightheadedness with hypotension: no Has patient had a PCN reaction causing severe rash involving mucus membranes or skin necrosis: yes Has patient had a PCN reaction that required hospitalization: no Has patient had a PCN reaction occurring within the last 10 years: no If all of the above answers are "NO", then may proceed with Cephalosporin use.   . Sulfa Antibiotics Rash    Under arm and thighs   Social History  Substance Use Topics  . Smoking status: Former Smoker    Packs/day: 0.30    Years: 10.00    Types: Cigarettes    Quit date: 07/09/1978  . Smokeless tobacco: Never Used  . Alcohol use Yes     Comment: 1-2 glasses of wine daily and a 12 oz beer daily   Past Medical History:  Diagnosis Date  . Allergy   . Anemia    iron deficiency  . 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 Bladder prolapse, female, acquired   . Chicken pox  as a child  . Dysuria 09/13/2011  . Female bladder prolapse 09/13/2011  . Fibrocystic breast 09/14/2011  . GERD (gastroesophageal reflux disease)   . H/O measles   . H/O mumps   . Hyperlipidemia   . 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 Osteoarthrosis    right shoulder  . Osteoporosis    reclast in Oct  . Ovarian cyst 09/14/2011  . [facility] spotted fever) 2017  . Uterine fibroid 11/2012   See Dr. Cletis Media note, surgery 11/2012  . Vaginitis 09/13/2011   Past Surgical History:  Procedure Laterality Date  . DILATATION & CURRETTAGE/HYSTEROSCOPY WITH RESECTOCOPE N/A 11/06/2012   Procedure: DILATATION & CURETTAGE/HYSTEROSCOPY WITH RESECTOCOPE;  Surgeon: Alwyn Pea, MD;  Location: [facility];  Service: Gynecology;  Laterality: N/A;  . HERNIA REPAIR  1992  . small intestine hernia  1994  . WISDOM TOOTH EXTRACTION     Family History  Problem Relation Age of Onset  . Heart disease Mother   . Osteoporosis Mother   . Cancer Father        prostate  . Osteoporosis Sister   . Gout Brother   . Gout Maternal Grandmother   . Heart disease Maternal Grandmother   . Alzheimer's disease Maternal Grandfather   . Osteoporosis Sister    Allergies as of 01/10/2017  Reactions   Penicillins Rash   Has patient had a PCN reaction causing immediate rash, facial/tongue/throat swelling, SOB or lightheadedness with hypotension: no Has patient had a PCN reaction causing severe rash involving mucus membranes or skin necrosis: yes Has patient had a PCN reaction that required hospitalization: no Has patient had a PCN reaction occurring within the last 10 years: no If all of the above answers are "NO", then may proceed with Cephalosporin use.   Sulfa Antibiotics Rash   Under arm and thighs      Medication List       Accurate as of 01/10/17 11:15 AM. Always use your most recent med list.          Biotin 10 MG Tabs Take 1 tablet by mouth daily.   CALCIUM + D3 PO Take 600 mg by mouth daily.   ESTRING 2  MG vaginal ring Generic drug:  estradiol Place 2 mg vaginally every 3 (three) months.   Fish Oil 1000 MG Caps Take 1 capsule by mouth daily.   Glucosamine 750 MG Tabs Take 1,200 mg by mouth daily.   MAGNESIA PO Take 1 tablet by mouth daily. OTC   omeprazole 20 MG capsule Commonly known as:  PRILOSEC TAKE 1 CAPSULE DAILY   Vitamin D-3 1000 units Caps Take 5,000 Units by mouth.       All past medical history, surgical history, allergies, family history, immunizations andmedications were updated in the EMR today and reviewed under the history and medication portions of their EMR.     ROS: Negative, with the exception of above mentioned in HPI   Objective:  BP 96/62 (BP Location: Left Arm, Patient Position: Sitting, Cuff Size: Small)   Pulse 71   Temp 98.7 F (37.1 C)   Resp 20   Wt 106 lb 8 oz (48.3 kg)   SpO2 97%   BMI 17.19 kg/m  Body mass index is 17.19 kg/m. Gen: Afebrile. No acute distress. Nontoxic in appearance, well developed, well nourished.  HENT: AT. [place]. MMM, no oral lesions. Eyes:Pupils Equal Round Reactive to light, Extraocular movements intact,  Conjunctiva without redness, discharge or icterus. Skin: healing vesicles central abd, extending to left to midline back T10 dermatome.   Neuro: Normal gait. PERLA. EOMi. Alert. Oriented x3   No exam data present No results found. No results found for this or any previous visit (from the past 24 hour(s)).  Assessment/Plan: Kelsey [name] is a 71 y.o. female present for OV for  Herpes zoster without complication - exam and HPI consistent with Shingles T10 left dermatome. Uncertain how much benefit will be received since ~5-7 days after rash/sensation.  - OTC shingles cream.  - Prednisone taper and valtrex prescribed. - AVS on shingles.  - F/U PRN   Reviewed expectations re: course of current medical issues.  Discussed self-management of symptoms.  Outlined signs and symptoms indicating need for  more acute intervention.  Patient verbalized understanding and all questions were answered.  Patient received an After-Visit Summary.   No orders of the defined types were placed in this encounter.  Note is dictated utilizing voice recognition software. Although note has been proof read prior to signing, occasional typographical errors still can be missed. If any questions arise, please do not hesitate to call for verification.   electronically signed by:  Howard Pouch, DO  [place]

## 2017-01-10 NOTE — Patient Instructions (Signed)
Start prednisone taper  Start valtrex every 8 hours     Shingles Shingles, which is also known as herpes zoster, is an infection that causes a painful skin rash and fluid-filled blisters. Shingles is not related to genital herpes, which is a sexually transmitted infection. Shingles only develops in people who:  Have had chickenpox.  Have received the chickenpox vaccine. (This is rare.)  What are the causes? Shingles is caused by varicella-zoster virus (VZV). This is the same virus that causes chickenpox. After exposure to VZV, the virus stays in the body in an inactive (dormant) state. Shingles develops if the virus reactivates. This can happen many years after the initial exposure to VZV. It is not known what causes this virus to reactivate. What increases the risk? People who have had chickenpox or received the chickenpox vaccine are at risk for shingles. Infection is more common in people who:  Are older than age 72.  Have a weakened defense (immune) system, such as those with HIV, AIDS, or cancer.  Are taking medicines that weaken the immune system, such as transplant medicines.  Are under great stress.  What are the signs or symptoms? Early symptoms of this condition include itching, tingling, and pain in an area on your skin. Pain may be described as burning, stabbing, or throbbing. A few days or weeks after symptoms start, a painful red rash appears, usually on one side of the body in a bandlike or beltlike pattern. The rash eventually turns into fluid-filled blisters that break open, scab over, and dry up in about 2-3 weeks. At any time during the infection, you may also develop:  A fever.  Chills.  A headache.  An upset stomach.  How is this diagnosed? This condition is diagnosed with a skin exam. Sometimes, skin or fluid samples are taken from the blisters before a diagnosis is made. These samples are examined under a microscope or sent to a lab for testing. How is  this treated? There is no specific cure for this condition. Your health care provider will probably prescribe medicines to help you manage pain, recover more quickly, and avoid long-term problems. Medicines may include:  Antiviral drugs.  Anti-inflammatory drugs.  Pain medicines.  If the area involved is on your face, you may be referred to a specialist, such as an eye doctor (ophthalmologist) or an ear, nose, and throat (ENT) doctor to help you avoid eye problems, chronic pain, or disability. Follow these instructions at home: Medicines  Take medicines only as directed by your health care provider.  Apply an anti-itch or numbing cream to the affected area as directed by your health care provider. Blister and Rash Care  Take a cool bath or apply cool compresses to the area of the rash or blisters as directed by your health care provider. This may help with pain and itching.  Keep your rash covered with a loose bandage (dressing). Wear loose-fitting clothing to help ease the pain of material rubbing against the rash.  Keep your rash and blisters clean with mild soap and cool water or as directed by your health care provider.  Check your rash every day for signs of infection. These include redness, swelling, and pain that lasts or increases.  Do not pick your blisters.  Do not scratch your rash. General instructions  Rest as directed by your health care provider.  Keep all follow-up visits as directed by your health care provider. This is important.  Until your blisters scab over, your infection  can cause chickenpox in people who have never had it or been vaccinated against it. To prevent this from happening, avoid contact with other people, especially: ? Babies. ? Pregnant women. ? Children who have eczema. ? Elderly people who have transplants. ? People who have chronic illnesses, such as leukemia or AIDS. Contact a health care provider if:  Your pain is not relieved with  prescribed medicines.  Your pain does not get better after the rash heals.  Your rash looks infected. Signs of infection include redness, swelling, and pain that lasts or increases. Get help right away if:  The rash is on your face or nose.  You have facial pain, pain around your eye area, or loss of feeling on one side of your face.  You have ear pain or you have ringing in your ear.  You have loss of taste.  Your condition gets worse. This information is not intended to replace advice given to you by your health care provider. Make sure you discuss any questions you have with your health care provider. Document Released: 06/25/2005 Document Revised: 02/19/2016 Document Reviewed: 05/06/2014 Elsevier Interactive Patient Education  2017 [employer].

## 2017-01-22 ENCOUNTER — Encounter: Payer: Self-pay | Admitting: Family Medicine

## 2017-01-22 ENCOUNTER — Ambulatory Visit (INDEPENDENT_AMBULATORY_CARE_PROVIDER_SITE_OTHER): Payer: Medicare Other | Admitting: Family Medicine

## 2017-01-22 VITALS — BP 98/63 | HR 89 | Temp 99.1°F | Resp 20 | Wt 105.0 lb

## 2017-01-22 DIAGNOSIS — W57XXXA Bitten or stung by nonvenomous insect and other nonvenomous arthropods, initial encounter: Secondary | ICD-10-CM

## 2017-01-22 DIAGNOSIS — L089 Local infection of the skin and subcutaneous tissue, unspecified: Secondary | ICD-10-CM

## 2017-01-22 MED ORDER — DOXYCYCLINE HYCLATE 100 MG PO TABS: 100.0000 mg | ORAL_TABLET | Freq: Two times a day (BID) | ORAL | 0 refills | Status: DC

## 2017-01-22 NOTE — Patient Instructions (Signed)
Take doxycyline every 12 hours for 10 days.  Soak toe/foot in warm epson salt soaks daily for 15 minutes. Keep clean and dry.

## 2017-01-22 NOTE — Progress Notes (Signed)
Kelsey [name] , [birth year], 71 y.o., female MRN: [id number] Patient Care Team    Relationship Specialty Notifications Start End  Kelsey Hillock, DO PCP - General Family Medicine  05/27/15   Kelsey Craver, MD Consulting Physician Gastroenterology  12/08/15   Kelsey Cantor, MD Consulting Physician Ophthalmology  12/08/15     Chief Complaint  Patient presents with  . Insect Bite    tick right third toe     Subjective: Pt presents for an OV with complaints of Tick bite. She states about 2 weeks ago she noticed a small black dot on her toe and try remove, but did not pay much attention to it after that. Last night she picked it off and noticed it appeared to be a tic. Her right third toe is red and swollen. She denies any drainage, fatigue, headache, fever or rash.   Depression screen [facility] 2/9 12/17/2016 12/07/2016 12/08/2015 12/02/2013  Decreased Interest 0 0 0 0  Down, Depressed, Hopeless 0 0 0 1  PHQ - 2 Score 0 0 0 1    Allergies  Allergen Reactions  . Penicillins Rash    Has patient had a PCN reaction causing immediate rash, facial/tongue/throat swelling, SOB or lightheadedness with hypotension: no Has patient had a PCN reaction causing severe rash involving mucus membranes or skin necrosis: yes Has patient had a PCN reaction that required hospitalization: no Has patient had a PCN reaction occurring within the last 10 years: no If all of the above answers are "NO", then may proceed with Cephalosporin use.   . Sulfa Antibiotics Rash    Under arm and thighs   Social History  Substance Use Topics  . Smoking status: Former Smoker    Packs/day: 0.30    Years: 10.00    Types: Cigarettes    Quit date: 07/09/1978  . Smokeless tobacco: Never Used  . Alcohol use Yes     Comment: 1-2 glasses of wine daily and a 12 oz beer daily   Past Medical History:  Diagnosis Date  . Allergy   . Anemia    iron deficiency  . 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 Bladder prolapse, female, acquired   .  Chicken pox as a child  . Dysuria 09/13/2011  . Female bladder prolapse 09/13/2011  . Fibrocystic breast 09/14/2011  . GERD (gastroesophageal reflux disease)   . H/O measles   . H/O mumps   . Hyperlipidemia   . 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 Osteoarthrosis    right shoulder  . Osteoporosis    reclast in Oct  . Ovarian cyst 09/14/2011  . [facility] spotted fever) 2017  . Uterine fibroid 11/2012   See Dr. Cletis Media note, surgery 11/2012  . Vaginitis 09/13/2011   Past Surgical History:  Procedure Laterality Date  . DILATATION & CURRETTAGE/HYSTEROSCOPY WITH RESECTOCOPE N/A 11/06/2012   Procedure: DILATATION & CURETTAGE/HYSTEROSCOPY WITH RESECTOCOPE;  Surgeon: Alwyn Pea, MD;  Location: [facility];  Service: Gynecology;  Laterality: N/A;  . HERNIA REPAIR  1992  . small intestine hernia  1994  . WISDOM TOOTH EXTRACTION     Family History  Problem Relation Age of Onset  . Heart disease Mother   . Osteoporosis Mother   . Cancer Father        prostate  . Osteoporosis Sister   . Gout Brother   . Gout Maternal Grandmother   . Heart disease Maternal Grandmother   . Alzheimer's disease Maternal Grandfather   . Osteoporosis Sister  Allergies as of 01/22/2017      Reactions   Penicillins Rash   Has patient had a PCN reaction causing immediate rash, facial/tongue/throat swelling, SOB or lightheadedness with hypotension: no Has patient had a PCN reaction causing severe rash involving mucus membranes or skin necrosis: yes Has patient had a PCN reaction that required hospitalization: no Has patient had a PCN reaction occurring within the last 10 years: no If all of the above answers are "NO", then may proceed with Cephalosporin use.   Sulfa Antibiotics Rash   Under arm and thighs      Medication List       Accurate as of 01/22/17  9:51 AM. Always use your most recent med list.          Biotin 10 MG Tabs Take 1 tablet by mouth daily.   CALCIUM + D3 PO Take 600 mg by mouth  daily.   ESTRING 2 MG vaginal ring Generic drug:  estradiol Place 2 mg vaginally every 3 (three) months.   Fish Oil 1000 MG Caps Take 1 capsule by mouth daily.   Glucosamine 750 MG Tabs Take 1,200 mg by mouth daily.   MAGNESIA PO Take 1 tablet by mouth daily. OTC   omeprazole 20 MG capsule Commonly known as:  PRILOSEC TAKE 1 CAPSULE DAILY   valACYclovir 1000 MG tablet Commonly known as:  VALTREX Take 1 tablet (1,000 mg total) by mouth 2 (two) times daily.   Vitamin D-3 1000 units Caps Take 5,000 Units by mouth.       All past medical history, surgical history, allergies, family history, immunizations andmedications were updated in the EMR today and reviewed under the history and medication portions of their EMR.     ROS: Negative, with the exception of above mentioned in HPI   Objective:  BP 98/63 (BP Location: Left Arm, Patient Position: Sitting, Cuff Size: Small)   Pulse 89   Temp 99.1 F (37.3 C)   Resp 20   Wt 105 lb (47.6 kg)   SpO2 97%   BMI 16.95 kg/m  Body mass index is 16.95 kg/m. Gen: Afebrile. No acute distress. Nontoxic in appearance, well developed, well nourished.  HENT: AT. [place].  MMM, no oral lesions.  Eyes:Pupils Equal Round Reactive to light, Extraocular movements intact,  Conjunctiva without redness, discharge or icterus. Right foot: Third distal toe with erythema, swelling distal joint. No tenderness to palpation. Good capillary refill. Normal sensation. Skin: no rashes, purpura or petechiae. Recent shingles resolved with treatment. Neuro: Normal gait. PERLA. EOMi. Alert. Oriented x3  No exam data present No results found. No results found for this or any previous visit (from the past 24 hour(s)).  Assessment/Plan: Kelsey [name] is a 71 y.o. female present for OV for  Tick bite, initial encounter Toe infection - Obvious area of attachment at the cuticle area of her third right toe, with a obvious infection process. Toe is red and  swollen. Treat with doxycycline. - Patient encouraged to use Epsom salt soaks at least once daily for 15-20 minutes. - Keep area clean and dry -Follow-up in 1-2 weeks, if not resolved, sooner if worsening.   Reviewed expectations re: course of current medical issues.  Discussed self-management of symptoms.  Outlined signs and symptoms indicating need for more acute intervention.  Patient verbalized understanding and all questions were answered.  Patient received an After-Visit Summary.    No orders of the defined types were placed in this encounter.    Note  is dictated utilizing voice recognition software. Although note has been proof read prior to signing, occasional typographical errors still can be missed. If any questions arise, please do not hesitate to call for verification.   electronically signed by:  Howard Pouch, DO  [place]

## 2017-01-31 ENCOUNTER — Ambulatory Visit (INDEPENDENT_AMBULATORY_CARE_PROVIDER_SITE_OTHER): Payer: Medicare Other | Admitting: Family Medicine

## 2017-01-31 ENCOUNTER — Other Ambulatory Visit: Payer: Self-pay

## 2017-01-31 ENCOUNTER — Encounter: Payer: Self-pay | Admitting: Family Medicine

## 2017-01-31 VITALS — BP 92/68 | HR 84 | Ht 65.5 in | Wt 106.0 lb

## 2017-01-31 DIAGNOSIS — R269 Unspecified abnormalities of gait and mobility: Secondary | ICD-10-CM

## 2017-01-31 DIAGNOSIS — M999 Biomechanical lesion, unspecified: Secondary | ICD-10-CM | POA: Diagnosis not present

## 2017-01-31 DIAGNOSIS — M533 Sacrococcygeal disorders, not elsewhere classified: Secondary | ICD-10-CM | POA: Diagnosis not present

## 2017-01-31 MED ORDER — VITAMIN D (ERGOCALCIFEROL) 1.25 MG (50000 UNIT) PO CAPS: 50000.0000 [IU] | ORAL_CAPSULE | ORAL | 0 refills | Status: DC

## 2017-01-31 NOTE — Assessment & Plan Note (Signed)
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 Responds fairly well to osteopathic manipulation. Encourage patient to continue with the once weekly vitamin D and the probably a. Encourage her to do more weight training and exercises. Patient will continue this. We discussed protein supplementation. Responding well to a supine manipulation. Follow-up again in 4-6 weeks

## 2017-01-31 NOTE — Assessment & Plan Note (Signed)
Better with orthotics.

## 2017-01-31 NOTE — Assessment & Plan Note (Signed)
Decision today to treat with OMT was based on Physical Exam  After verbal consent patient was treated with HVLA, ME, FPR techniques in cervical, thoracic, lumbar and sacral areas  Patient tolerated the procedure well with improvement in symptoms  Patient given exercises, stretches and lifestyle modifications  See medications in patient instructions if given  Patient will follow up in 4-6 weeks 

## 2017-01-31 NOTE — Patient Instructions (Signed)
Great to see yo u  Kelsey [name] is your friend after activity  Once weekly vitamin D prescribed Vega sport protein powder would be great 1-2 times a day  Stay active.  See me again in 4 weeks right before your trip

## 2017-01-31 NOTE — Progress Notes (Signed)
[name] [address] [place], [place] [address] Phone: [phone] Subjective:    I'm seeing this patient by the request  of:  [name], Kelsey [name]   CC: Back pain f/u  [id number]  Kelsey [name] is a 71 y.o. female coming in with complaint of Back pain. 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 We started osteopathic manipulation and patient was putting custom orthotics. Patient states She is doing relatively well. Has been doing exercises. States that the over-the-counter medications may be helping. Patient still having some mild back pain. Patient states since she has been walking 7 a miles without any significant pain though.     Past Medical History:  Diagnosis Date  . Allergy   . Anemia    iron deficiency  . 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 Bladder prolapse, female, acquired   . Chicken pox as a child  . Dysuria 09/13/2011  . Female bladder prolapse 09/13/2011  . Fibrocystic breast 09/14/2011  . GERD (gastroesophageal reflux disease)   . H/O measles   . H/O mumps   . Hyperlipidemia   . 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 Osteoarthrosis    right shoulder  . Osteoporosis    reclast in Oct  . Ovarian cyst 09/14/2011  . [facility] spotted fever) 2017  . Uterine fibroid 11/2012   See Dr. Cletis [name] note, surgery 11/2012  . Vaginitis 09/13/2011   Past Surgical History:  Procedure Laterality Date  . DILATATION & CURRETTAGE/HYSTEROSCOPY WITH RESECTOCOPE N/A 11/06/2012   Procedure: DILATATION & CURETTAGE/HYSTEROSCOPY WITH RESECTOCOPE;  Surgeon: Alwyn Pea, MD;  Location: [facility];  Service: Gynecology;  Laterality: N/A;  . HERNIA REPAIR  1992  . small intestine hernia  1994  . WISDOM TOOTH EXTRACTION     Social History   Social History  . Marital status: Married    Spouse name: N/A  . Number of children: N/A  . Years of education: N/A   Social History Main Topics  . Smoking  status: Former Smoker    Packs/day: 0.30    Years: 10.00    Types: Cigarettes    Quit date: 07/09/1978  . Smokeless tobacco: Never Used  . Alcohol use Yes     Comment: 1-2 glasses of wine daily and a 12 oz beer daily  . Drug use: No  . Sexual activity: No   Other Topics Concern  . None   Social History Narrative  . None   Allergies  Allergen Reactions  . Penicillins Rash    Has patient had a PCN reaction causing immediate rash, facial/tongue/throat swelling, SOB or lightheadedness with hypotension: no Has patient had a PCN reaction causing severe rash involving mucus membranes or skin necrosis: yes Has patient had a PCN reaction that required hospitalization: no Has patient had a PCN reaction occurring within the last 10 years: no If all of the above answers are "NO", then may proceed with Cephalosporin use.   . Sulfa Antibiotics Rash    Under arm and thighs   Family History  Problem Relation Age of Onset  . Heart disease Mother   . Osteoporosis Mother   . Cancer Father        prostate  . Osteoporosis Sister   . Gout Brother   . Gout Maternal Grandmother   . Heart disease Maternal Grandmother   . Alzheimer's disease Maternal Grandfather   . Osteoporosis Sister  Past medical history, social, surgical and family history all reviewed in electronic medical record.  No pertanent information unless stated regarding to the chief complaint.   Review of Systems: No headache, visual changes, nausea, vomiting, diarrhea, constipation, dizziness, abdominal pain, skin rash, fevers, chills, night sweats, weight loss, swollen lymph nodes, body aches, joint swelling,  chest pain, shortness of breath, mood changes.  Positive muscle aches  Objective  Blood pressure 92/68, pulse 84, height 5' 5.5" (1.664 m), weight 106 lb (48.1 kg).   Systems examined below as of 01/31/17 General: NAD A&O x3 mood, affect normal  HEENT: Pupils equal, extraocular movements intact no  nystagmus Respiratory: not short of breath at rest or with speaking Cardiovascular: No lower extremity edema, non tender Skin: Warm dry intact with no signs of infection or rash on extremities or on axial skeleton. Abdomen: Soft nontender, no masses Neuro: Cranial nerves  intact, neurovascularly intact in all extremities with 2+ DTRs and 2+ pulses. Lymph: No lymphadenopathy appreciated today  Gait normal with good balance and coordination.  MSK: Non tender with full range of motion and good stability and symmetric strength and tone of shoulders, elbows, wrist,  knee hips and ankles bilaterally. Arthritic changes of multiple joints   Back Exam:  Inspection: Mild loss in lordosis Motion: Flexion 45 deg, Extension 25 deg, Side Bending to 45 deg bilaterally,  Rotation to 45 deg bilaterally  SLR laying: Negative  XSLR laying: Negative  Palpable tenderness: Tender to palpation in the paraspinal musculature mostly in the lumbosacral area more on the right side. FABER: Positive right. Sensory change: Gross sensation intact to all lumbar and sacral dermatomes.  Reflexes: 2+ at both patellar tendons, 2+ at achilles tendons, Babinski's downgoing.  Strength at foot  Plantar-flexion: 5/5 Dorsi-flexion: 5/5 Eversion: 5/5 Inversion: 5/5  Leg strength  Quad: 5/5 Hamstring: 5/5 Hip flexor: 5/5 Hip abductors: 5/5  Gait unremarkable.   Osteopathic findings C6 flexed rotated and side bent left T3 extended rotated and side bent right inhaled third rib T6 extended rotated and side bent left L1 flexed rotated and side bent right Sacrum right on right    Impression and Recommendations:     This case required medical decision making of moderate complexity.      Note: This dictation was prepared with Dragon dictation along with smaller phrase technology. Any transcriptional errors that result from this process are unintentional.

## 2017-02-22 ENCOUNTER — Telehealth: Payer: Self-pay

## 2017-02-22 NOTE — Telephone Encounter (Signed)
Patient came by office inquiring about compounded medication [id number]. Medication has expired but was asking if okay to take for muscle pain.  Roderic Ovens, RN was consulted and she stated that it should be fine to use but since it was expired the effectiveness may not be as good.  Patient notified and verbalized understanding.

## 2017-02-25 ENCOUNTER — Ambulatory Visit: Payer: Self-pay

## 2017-02-26 ENCOUNTER — Ambulatory Visit (INDEPENDENT_AMBULATORY_CARE_PROVIDER_SITE_OTHER): Payer: Medicare Other

## 2017-02-26 ENCOUNTER — Ambulatory Visit (INDEPENDENT_AMBULATORY_CARE_PROVIDER_SITE_OTHER): Payer: Medicare Other | Admitting: Family Medicine

## 2017-02-26 ENCOUNTER — Encounter: Payer: Self-pay | Admitting: Family Medicine

## 2017-02-26 VITALS — BP 120/80 | HR 84 | Ht 65.5 in | Wt 105.0 lb

## 2017-02-26 DIAGNOSIS — M9901 Segmental and somatic dysfunction of cervical region: Secondary | ICD-10-CM | POA: Diagnosis not present

## 2017-02-26 DIAGNOSIS — M810 Age-related osteoporosis without current pathological fracture: Secondary | ICD-10-CM | POA: Diagnosis not present

## 2017-02-26 DIAGNOSIS — M4698 Unspecified inflammatory spondylopathy, sacral and sacrococcygeal region: Secondary | ICD-10-CM | POA: Diagnosis not present

## 2017-02-26 DIAGNOSIS — M999 Biomechanical lesion, unspecified: Secondary | ICD-10-CM | POA: Diagnosis not present

## 2017-02-26 DIAGNOSIS — M47818 Spondylosis without myelopathy or radiculopathy, sacral and sacrococcygeal region: Secondary | ICD-10-CM | POA: Insufficient documentation

## 2017-02-26 MED ORDER — DENOSUMAB 60 MG/ML ~~LOC~~ SOLN
60.0000 mg | Freq: Once | SUBCUTANEOUS | Status: AC
  Administered 2017-02-26: 60 mg via SUBCUTANEOUS

## 2017-02-26 MED ORDER — PREDNISONE 50 MG PO TABS: 50.0000 mg | ORAL_TABLET | Freq: Every day | ORAL | 0 refills | Status: DC

## 2017-02-26 NOTE — Progress Notes (Addendum)
Patient presents today for Prolia injection. Given with no incidence or problems. Patient left with no complaints.  Medical screening examination/treatment/procedure(s) were performed by non-physician practitioner and as supervising physician I was immediately available for consultation/collaboration.  I agree with above assessment and plan.  Electronically Signed by: Renee [name], DO [facility]- OR   

## 2017-02-26 NOTE — Progress Notes (Signed)
[name] [address] [place], [place] [address] Phone: [phone] Subjective:    I'm seeing this patient by the request  of:  [name], [name], DO   CC: Back pain f/u  [id number]  Wanna Kelsey [name] is a 71 y.o. female coming in with complaint of Back pain. 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 We started osteopathic manipulation and patient was putting custom orthotics. Patient was making some improvement in last exam. Patient is going on a long hiking trip. Patient states she is doing well.  Still pain on the bottom right side.  Mild radiation down the leg. Very seldom.     Past Medical History:  Diagnosis Date  . Allergy   . Anemia    iron deficiency  . 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 Bladder prolapse, female, acquired   . Chicken pox as a child  . Dysuria 09/13/2011  . Female bladder prolapse 09/13/2011  . Fibrocystic breast 09/14/2011  . GERD (gastroesophageal reflux disease)   . H/O measles   . H/O mumps   . Hyperlipidemia   . 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 Osteoarthrosis    right shoulder  . Osteoporosis    reclast in Oct  . Ovarian cyst 09/14/2011  . [facility] spotted fever) 2017  . Uterine fibroid 11/2012   See Dr. Cletis Media note, surgery 11/2012  . Vaginitis 09/13/2011   Past Surgical History:  Procedure Laterality Date  . DILATATION & CURRETTAGE/HYSTEROSCOPY WITH RESECTOCOPE N/A 11/06/2012   Procedure: DILATATION & CURETTAGE/HYSTEROSCOPY WITH RESECTOCOPE;  Surgeon: Alwyn Pea, MD;  Location: [facility];  Service: Gynecology;  Laterality: N/A;  . HERNIA REPAIR  1992  . small intestine hernia  1994  . WISDOM TOOTH EXTRACTION     Social History   Social History  . Marital status: Married    Spouse name: N/A  . Number of children: N/A  . Years of education: N/A   Social History Main Topics  . Smoking status: Former Smoker    Packs/day: 0.30    Years: 10.00     Types: Cigarettes    Quit date: 07/09/1978  . Smokeless tobacco: Never Used  . Alcohol use Yes     Comment: 1-2 glasses of wine daily and a 12 oz beer daily  . Drug use: No  . Sexual activity: No   Other Topics Concern  . None   Social History Narrative  . None   Allergies  Allergen Reactions  . Penicillins Rash    Has patient had a PCN reaction causing immediate rash, facial/tongue/throat swelling, SOB or lightheadedness with hypotension: no Has patient had a PCN reaction causing severe rash involving mucus membranes or skin necrosis: yes Has patient had a PCN reaction that required hospitalization: no Has patient had a PCN reaction occurring within the last 10 years: no If all of the above answers are "NO", then may proceed with Cephalosporin use.   . Sulfa Antibiotics Rash    Under arm and thighs   Family History  Problem Relation Age of Onset  . Heart disease Mother   . Osteoporosis Mother   . Cancer Father        prostate  . Osteoporosis Sister   . Gout Brother   . Gout Maternal Grandmother   . Heart disease Maternal Grandmother   . Alzheimer's disease Maternal Grandfather   . Osteoporosis Sister     Past  medical history, social, surgical and family history all reviewed in electronic medical record.  No pertanent information unless stated regarding to the chief complaint.   Review of Systems: No headache, visual changes, nausea, vomiting, diarrhea, constipation, dizziness, abdominal pain, skin rash, fevers, chills, night sweats, weight loss, swollen lymph nodes, body aches, joint swelling,  chest pain, shortness of breath, mood changes.  Positive muscle aches  Objective  Blood pressure 120/80, pulse 84, height 5' 5.5" (1.664 m), weight 105 lb (47.6 kg).   Systems examined below as of 02/26/17 General: NAD A&O x3 mood, affect normal  HEENT: Pupils equal, extraocular movements intact no nystagmus Respiratory: not short of breath at rest or with  speaking Cardiovascular: No lower extremity edema, non tender Skin: Warm dry intact with no signs of infection or rash on extremities or on axial skeleton. Abdomen: Soft nontender, no masses Neuro: Cranial nerves  intact, neurovascularly intact in all extremities with 2+ DTRs and 2+ pulses. Lymph: No lymphadenopathy appreciated today  Gait normal with good balance and coordination.  MSK: Non tender with full range of motion and good stability and symmetric strength and tone of shoulders, elbows, wrist,  knee hips and ankles bilaterally. Arthritic changes of multiple joints   Back Exam:  Inspection: Loss of lordosis with degenerative scoliosis changes Motion: Flexion 40 deg, Extension 20 deg, Side Bending to 25 deg bilaterally,  Rotation to 25 deg bilaterally  SLR laying: Negative  XSLR laying: Negative  Palpable tenderness: Mild discomfort more over the right leg really joint. FABER: Positive right. Sensory change: Gross sensation intact to all lumbar and sacral dermatomes.  Reflexes: 2+ at both patellar tendons, 2+ at achilles tendons, Babinski's downgoing.  Strength at foot  Plantar-flexion: 5/5 Dorsi-flexion: 5/5 Eversion: 5/5 Inversion: 5/5  Leg strength  Quad: 5/5 Hamstring: 5/5 Hip flexor: 5/5 Hip abductors: 5/5  Gait unremarkable.    Osteopathic findings C2 flexed rotated and side bent right T3 extended rotated and side bent right inhaled third rib T6 extended rotated and side bent left T11 extended rotated and side bent left L2 flexed rotated and side bent right Sacrum right on right    Impression and Recommendations:     This case required medical decision making of moderate complexity.      Note: This dictation was prepared with Dragon dictation along with smaller phrase technology. Any transcriptional errors that result from this process are unintentional.

## 2017-02-26 NOTE — Assessment & Plan Note (Signed)
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 Discussed with patient at great length. Continue on the medications. Given prednisone for her trip. Does respond well to osteopathic manipulation. Patient will follow-up with me again in 3-4 weeks for further evaluation and treatment. Could be a candidate for injection at follow-up.

## 2017-02-26 NOTE — Patient Instructions (Addendum)
Good to see you  Ice 20 minutes 2 times daily. Usually after activity and before bed. Sent in the prednisone for you to take it if pain worsens on your trip  Get the right size shoes or consider double socks.  xerelo shoes could be good.  Have a great trip  See me again in 3-4 weeks.

## 2017-02-26 NOTE — Assessment & Plan Note (Signed)
Decision today to treat with OMT was based on Physical Exam  After verbal consent patient was treated with HVLA, ME, FPR techniques in cervical, thoracic, lumbar and sacral areas  Patient tolerated the procedure well with improvement in symptoms  Patient given exercises, stretches and lifestyle modifications  See medications in patient instructions if given  Patient will follow up in 3-4 weeks  

## 2017-02-28 ENCOUNTER — Ambulatory Visit: Payer: Self-pay | Admitting: Family Medicine

## 2017-03-19 ENCOUNTER — Ambulatory Visit (INDEPENDENT_AMBULATORY_CARE_PROVIDER_SITE_OTHER): Payer: Medicare Other | Admitting: Family Medicine

## 2017-03-19 ENCOUNTER — Encounter: Payer: Self-pay | Admitting: Family Medicine

## 2017-03-19 ENCOUNTER — Ambulatory Visit (INDEPENDENT_AMBULATORY_CARE_PROVIDER_SITE_OTHER)
Admission: RE | Admit: 2017-03-19 | Discharge: 2017-03-19 | Disposition: A | Discharge status: Home / Self Care | Payer: Medicare Other | Source: Ambulatory Visit | Attending: Family Medicine | Admitting: Family Medicine

## 2017-03-19 VITALS — BP 82/62 | HR 87 | Wt 106.0 lb

## 2017-03-19 DIAGNOSIS — M545 Low back pain, unspecified: Secondary | ICD-10-CM

## 2017-03-19 DIAGNOSIS — M4698 Unspecified inflammatory spondylopathy, sacral and sacrococcygeal region: Secondary | ICD-10-CM | POA: Diagnosis not present

## 2017-03-19 DIAGNOSIS — M47818 Spondylosis without myelopathy or radiculopathy, sacral and sacrococcygeal region: Secondary | ICD-10-CM

## 2017-03-19 DIAGNOSIS — S3992XA Unspecified injury of lower back, initial encounter: Secondary | ICD-10-CM | POA: Diagnosis not present

## 2017-03-19 DIAGNOSIS — M999 Biomechanical lesion, unspecified: Secondary | ICD-10-CM

## 2017-03-19 NOTE — Assessment & Plan Note (Signed)
Decision today to treat with OMT was based on Physical Exam  After verbal consent patient was treated with HVLA, ME, FPR techniques in cervical, thoracic, lumbar and sacral areas  Patient tolerated the procedure well with improvement in symptoms  Patient given exercises, stretches and lifestyle modifications  See medications in patient instructions if given  Patient will follow up in 6-8 weeks 

## 2017-03-19 NOTE — Assessment & Plan Note (Signed)
I still believe that sacroiliac arthritis is contribute in. X-rays ordered today. We discussed icing regimen and home exercises. We'll continue this. Continue topical anti-inflammatories. Worsening symptoms we'll consider possible injection or possible advance imaging. Patient will follow-up with me again in 4-6 weeks.

## 2017-03-19 NOTE — Progress Notes (Signed)
[name] [address], [place] [address] Phone: [phone] Subjective:    I'm seeing this patient by the request  of:  [name], [name], DO   CC: Back pain f/u  [id number]  Kelsey [name] is a 71 y.o. female coming in with complaint of Back pain. 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 We started osteopathic manipulation and patient was putting custom orthotics. Went to have patient on a trip Did great Overall. Patient did do a significant amount hiking with minimal pain. States that there is some tightness in the back but nothing seems to be worsening. No radicular symptoms down the back. Ibuprofen in the topical anti-inflammatories were helpful      Past Medical History:  Diagnosis Date  . Allergy   . Anemia    iron deficiency  . 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 Bladder prolapse, female, acquired   . Chicken pox as a child  . Dysuria 09/13/2011  . Female bladder prolapse 09/13/2011  . Fibrocystic breast 09/14/2011  . GERD (gastroesophageal reflux disease)   . H/O measles   . H/O mumps   . Hyperlipidemia   . 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 Osteoarthrosis    right shoulder  . Osteoporosis    reclast in Oct  . Ovarian cyst 09/14/2011  . 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 Uterine fibroid 11/2012   See Dr. Cletis Media note, surgery 11/2012  . Vaginitis 09/13/2011   Past Surgical History:  Procedure Laterality Date  . DILATATION & CURRETTAGE/HYSTEROSCOPY WITH RESECTOCOPE N/A 11/06/2012   Procedure: DILATATION & CURETTAGE/HYSTEROSCOPY WITH RESECTOCOPE;  Surgeon: Alwyn Pea, MD;  Location: [facility];  Service: Gynecology;  Laterality: N/A;  . HERNIA REPAIR  1992  . small intestine hernia  1994  . WISDOM TOOTH EXTRACTION     Social History   Social History  . Marital status: Married    Spouse name: N/A  . Number of children: N/A  . Years of education: N/A   Social  History Main Topics  . Smoking status: Former Smoker    Packs/day: 0.30    Years: 10.00    Types: Cigarettes    Quit date: 07/09/1978  . Smokeless tobacco: Never Used  . Alcohol use Yes     Comment: 1-2 glasses of wine daily and a 12 oz beer daily  . Drug use: No  . Sexual activity: No   Other Topics Concern  . None   Social History Narrative  . None   Allergies  Allergen Reactions  . Penicillins Rash    Has patient had a PCN reaction causing immediate rash, facial/tongue/throat swelling, SOB or lightheadedness with hypotension: no Has patient had a PCN reaction causing severe rash involving mucus membranes or skin necrosis: yes Has patient had a PCN reaction that required hospitalization: no Has patient had a PCN reaction occurring within the last 10 years: no If all of the above answers are "NO", then may proceed with Cephalosporin use.   . Sulfa Antibiotics Rash    Under arm and thighs   Family History  Problem Relation Age of Onset  . Heart disease Mother   . Osteoporosis Mother   . Cancer Father        prostate  . Osteoporosis Sister   . Gout Brother   . Gout Maternal Grandmother   . Heart disease Maternal Grandmother   . Alzheimer's disease Maternal  Grandfather   . Osteoporosis Sister     Past medical history, social, surgical and family history all reviewed in electronic medical record.  No pertanent information unless stated regarding to the chief complaint.   Review of Systems: No headache, visual changes, nausea, vomiting, diarrhea, constipation, dizziness, abdominal pain, skin rash, fevers, chills, night sweats, weight loss, swollen lymph nodes, body aches, joint swelling, chest pain, shortness of breath, mood changes.  Positive muscle aches  Objective  Blood pressure (!) 82/62, pulse 87, weight 106 lb (48.1 kg), SpO2 96 %.   Systems examined below as of 03/19/17 General: NAD A&O x3 mood, affect normal  HEENT: Pupils equal, extraocular movements intact  no nystagmus Respiratory: not short of breath at rest or with speaking Cardiovascular: No lower extremity edema, non tender Skin: Warm dry intact with no signs of infection or rash on extremities or on axial skeleton. Abdomen: Soft nontender, no masses Neuro: Cranial nerves  intact, neurovascularly intact in all extremities with 2+ DTRs and 2+ pulses. Lymph: No lymphadenopathy appreciated today  Gait normal with good balance and coordination.  MSK: Non tender with full range of motion and good stability and symmetric strength and tone of shoulders, elbows, wrist,  knee hips and ankles bilaterally. Arthritic changes of multiple joints   Back Exam:  Inspection: Loss of lordosis with degenerative scoliosis changes Motion: Flexion 30 deg, Extension 10 deg, Side Bending to 25 deg bilaterally,  Rotation to 25 deg bilaterally  SLR laying: Negative  XSLR laying: Negative  Palpable tenderness: Patient does have some mild increase in tenderness especially on the right lumbar paraspinal musculature in the right sacroiliac joint FABER: Positive right. Sensory change: Gross sensation intact to all lumbar and sacral dermatomes.  Reflexes: 2+ at both patellar tendons, 2+ at achilles tendons, Babinski's downgoing.  Strength at foot  Plantar-flexion: 5/5 Dorsi-flexion: 5/5 Eversion: 5/5 Inversion: 5/5  Leg strength  Quad: 5/5 Hamstring: 5/5 Hip flexor: 5/5 Hip abductors: 5/5  Gait unremarkable.  Osteopathic findings C2 flexed rotated and side bent right C4 flexed rotated and side bent left C7 flexed rotated and side bent left T3 extended rotated and side bent right inhaled third rib T6 extended rotated and side bent left L2 flexed rotated and side bent right L4 flexed rotated and side bent right Sacrum right on right     Impression and Recommendations:     This case required medical decision making of moderate complexity.      Note: This dictation was prepared with Dragon dictation  along with smaller phrase technology. Any transcriptional errors that result from this process are unintentional.

## 2017-03-19 NOTE — Patient Instructions (Signed)
Good to see you  Kelsey [name] is your friend.  Continue the vitamins We will call you when we get samples.  Xray downstairs today  See me again in 6-8 weeks.

## 2017-04-02 ENCOUNTER — Ambulatory Visit (INDEPENDENT_AMBULATORY_CARE_PROVIDER_SITE_OTHER): Payer: Medicare Other

## 2017-04-02 DIAGNOSIS — Z23 Encounter for immunization: Secondary | ICD-10-CM | POA: Diagnosis not present

## 2017-04-11 DIAGNOSIS — T1512XA Foreign body in conjunctival sac, left eye, initial encounter: Secondary | ICD-10-CM | POA: Diagnosis not present

## 2017-04-30 ENCOUNTER — Encounter: Payer: Self-pay | Admitting: Family Medicine

## 2017-04-30 ENCOUNTER — Ambulatory Visit (INDEPENDENT_AMBULATORY_CARE_PROVIDER_SITE_OTHER): Payer: Medicare Other | Admitting: Family Medicine

## 2017-04-30 VITALS — BP 108/68 | HR 81 | Wt 105.0 lb

## 2017-04-30 DIAGNOSIS — M999 Biomechanical lesion, unspecified: Secondary | ICD-10-CM

## 2017-04-30 DIAGNOSIS — M47818 Spondylosis without myelopathy or radiculopathy, sacral and sacrococcygeal region: Secondary | ICD-10-CM

## 2017-04-30 DIAGNOSIS — M4698 Unspecified inflammatory spondylopathy, sacral and sacrococcygeal region: Secondary | ICD-10-CM | POA: Diagnosis not present

## 2017-04-30 NOTE — Assessment & Plan Note (Signed)
Severe overall. Patient has had difficulty. I do believe the patient also has osteophytic changes of the degenerative disc plan overall. We discussed the possibility of MRIs of needed. We discussed possibility of injections. Patient will follow-up again in 4 weeks.

## 2017-04-30 NOTE — Assessment & Plan Note (Signed)
Decision today to treat with OMT was based on Physical Exam  After verbal consent patient was treated with HVLA, ME, FPR techniques in cervical, thoracic, lumbar and sacral areas  Patient tolerated the procedure well with improvement in symptoms  Patient given exercises, stretches and lifestyle modifications  See medications in patient instructions if given  Patient will follow up in 4-6 weeks 

## 2017-04-30 NOTE — Progress Notes (Signed)
[name] [address], [place] [address] Phone: [phone] Subjective:    I'm seeing this patient by the request  of:    CC: Back pain follow-up  [id number]  Kelsey [name] is a 71 y.o. female coming for follow up for back pain. She has been having some sharp, stabbing pains on the right side of her back. She takes prednisone which helps but uses it on as as needed basis. She would also like to know the results from her xray. Patient's x-rays do show that patient did have severe degenerative arthritis. Seems to be worse in the sacroiliac area. Patient is doing all the other conservative therapy with very mild improving. States that she feels that there is more tightness recently. States that she sits on that and 5 minutes extremely difficult to get out of a chair.   patient's x-rays that show a calcific pelvic mass is likely more of a calcific fibroid.  Past Medical History:  Diagnosis Date  . Allergy   . Anemia    iron deficiency  . 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 Bladder prolapse, female, acquired   . Chicken pox as a child  . Dysuria 09/13/2011  . Female bladder prolapse 09/13/2011  . Fibrocystic breast 09/14/2011  . GERD (gastroesophageal reflux disease)   . H/O measles   . H/O mumps   . Hyperlipidemia   . 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 Osteoarthrosis    right shoulder  . Osteoporosis    reclast in Oct  . Ovarian cyst 09/14/2011  . RMS[facility] spotted fever) 2017  . Uterine fibroid 11/2012   See Dr. Cletis Media note, surgery 11/2012  . Vaginitis 09/13/2011   Past Surgical History:  Procedure Laterality Date  . DILATATION & CURRETTAGE/HYSTEROSCOPY WITH RESECTOCOPE N/A 11/06/2012   Procedure: DILATATION & CURETTAGE/HYSTEROSCOPY WITH RESECTOCOPE;  Surgeon: Alwyn Pea, MD;  Location: [facility];  Service: Gynecology;  Laterality: N/A;  . HERNIA REPAIR  1992  . small intestine hernia  1994  . WISDOM TOOTH EXTRACTION     Social  History   Social History  . Marital status: Married    Spouse name: N/A  . Number of children: N/A  . Years of education: N/A   Social History Main Topics  . Smoking status: Former Smoker    Packs/day: 0.30    Years: 10.00    Types: Cigarettes    Quit date: 07/09/1978  . Smokeless tobacco: Never Used  . Alcohol use Yes     Comment: 1-2 glasses of wine daily and a 12 oz beer daily  . Drug use: No  . Sexual activity: No   Other Topics Concern  . None   Social History Narrative  . None   Allergies  Allergen Reactions  . Penicillins Rash    Has patient had a PCN reaction causing immediate rash, facial/tongue/throat swelling, SOB or lightheadedness with hypotension: no Has patient had a PCN reaction causing severe rash involving mucus membranes or skin necrosis: yes Has patient had a PCN reaction that required hospitalization: no Has patient had a PCN reaction occurring within the last 10 years: no If all of the above answers are "NO", then may proceed with Cephalosporin use.   . Sulfa Antibiotics Rash    Under arm and thighs   Family History  Problem Relation Age of Onset  . Heart disease Mother   . Osteoporosis Mother   . Cancer Father  prostate  . Osteoporosis Sister   . Gout Brother   . Gout Maternal Grandmother   . Heart disease Maternal Grandmother   . Alzheimer's disease Maternal Grandfather   . Osteoporosis Sister      Past medical history, social, surgical and family history all reviewed in electronic medical record.  No pertanent information unless stated regarding to the chief complaint.   Review of Systems:Review of systems updated and as accurate as of 04/30/17  No headache, visual changes, nausea, vomiting, diarrhea, constipation, dizziness, abdominal pain, skin rash, fevers, chills, night sweats, weight loss, swollen lymph nodes, body aches, joint swelling, chest pain, shortness of breath, mood changes. Positive muscle aches  Objective  Blood  pressure 108/68, pulse 81, weight 105 lb (47.6 kg), SpO2 98 %. Systems examined below as of 04/30/17   General: No apparent distress alert and oriented x3 mood and affect normal, dressed appropriately.  HEENT: Pupils equal, extraocular movements intact  Respiratory: Patient's speak in full sentences and does not appear short of breath  Cardiovascular: No lower extremity edema, non tender, no erythema  Skin: Warm dry intact with no signs of infection or rash on extremities or on axial skeleton.  Abdomen: Soft nontender  Neuro: Cranial nerves II through XII are intact, neurovascularly intact in all extremities with 2+ DTRs and 2+ pulses.  Lymph: No lymphadenopathy of posterior or anterior cervical chain or axillae bilaterally.  Gait normal with good balance and coordination.  MSK:  Non tender with full range of motion and good stability and symmetric strength and tone of shoulders, elbows, wrist, hip, knee and ankles bilaterally. Arthritic changes of multiple joints Back Exam:  Inspection: Degenerative scoliosis noted with significant loss of lordosis Motion: Flexion 35 deg, Extension 5 deg, Side Bending to 25 deg bilaterally,  Rotation to 45 deg bilaterally  SLR laying: Positive straight leg test on the left. This is new. XSLR laying: Negative  Palpable tenderness: Severe tenderness over the sacroiliac joints bilaterally. FABER: negative. Sensory change: Gross sensation intact to all lumbar and sacral dermatomes.  Reflexes: 2+ at both patellar tendons, 2+ at achilles tendons, Babinski's downgoing.  Strength at foot  Plantar-flexion: 5/5 Dorsi-flexion: 5/5 Eversion: 5/5 Inversion: 5/5  Leg strength  Quad: 5/5 Hamstring: 5/5 Hip flexor: 5/5 Hip abductors: 5/5  Gait unremarkable.  Osteopathic findings C6 flexed rotated and side bent left T3 extended rotated and side bent right inhaled third rib T9 extended rotated and side bent left L2 flexed rotated and side bent right L4 flexed  rotated and side bent left Sacrum right on right      Impression and Recommendations:     This case required medical decision making of moderate complexity.      Note: This dictation was prepared with Dragon dictation along with smaller phrase technology. Any transcriptional errors that result from this process are unintentional.

## 2017-04-30 NOTE — Patient Instructions (Signed)
Good to see you  Tell me if you change your mind on the MRI Ice is your friend Stay active.  See me again in 4-5 weeks.

## 2017-05-27 NOTE — Progress Notes (Signed)
[name] [address], [place] [address] Phone: [phone] Subjective:    I'm seeing this patient by the request  of:    CC: Back pain and sacroiliac pain follow-up  [id number]  Kelsey [name] is a 71 y.o. female coming in with complaint of pain in the middle of the lumbar spine. She describes the pain as discomfort. She continues with the exercises.  Patient has been making some progress but states that unfortunately cannot be without pain.  Any change in position becomes difficult.  Feels like she would not be able to do the hiking trip she did multiple months ago secondary to the pain.  Patient has had workup including x-rays that showed severe osteoarthritic changes.     Past Medical History:  Diagnosis Date  . Allergy   . Anemia    iron deficiency  . 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 Bladder prolapse, female, acquired   . Chicken pox as a child  . Dysuria 09/13/2011  . Female bladder prolapse 09/13/2011  . Fibrocystic breast 09/14/2011  . GERD (gastroesophageal reflux disease)   . H/O measles   . H/O mumps   . Hyperlipidemia   . 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 Osteoarthrosis    right shoulder  . Osteoporosis    reclast in Oct  . Ovarian cyst 09/14/2011  . [facility] spotted fever) 2017  . Uterine fibroid 11/2012   See Dr. Cletis Media note, surgery 11/2012  . Vaginitis 09/13/2011   Past Surgical History:  Procedure Laterality Date  . DILATATION & CURETTAGE/HYSTEROSCOPY WITH RESECTOCOPE N/A 11/06/2012   Performed by Alwyn Pea, MD at [facility]  . HERNIA REPAIR  1992  . small intestine hernia  1994  . WISDOM TOOTH EXTRACTION     Social History   Socioeconomic History  . Marital status: Married    Spouse name: Not on file  . Number of children: Not on file  . Years of education: Not on file  . Highest education level: Not on file  Social Needs  . Financial resource strain: Not on file  . Food insecurity - worry: Not  on file  . Food insecurity - inability: Not on file  . Transportation needs - medical: Not on file  . Transportation needs - non-medical: Not on file  Occupational History  . Not on file  Tobacco Use  . Smoking status: Former Smoker    Packs/day: 0.30    Years: 10.00    Pack years: 3.00    Types: Cigarettes    Last attempt to quit: 07/09/1978    Years since quitting: 38.9  . Smokeless tobacco: Never Used  Substance and Sexual Activity  . Alcohol use: Yes    Comment: 1-2 glasses of wine daily and a 12 oz beer daily  . Drug use: No  . Sexual activity: No  Other Topics Concern  . Not on file  Social History Narrative  . Not on file   Allergies  Allergen Reactions  . Penicillins Rash    Has patient had a PCN reaction causing immediate rash, facial/tongue/throat swelling, SOB or lightheadedness with hypotension: no Has patient had a PCN reaction causing severe rash involving mucus membranes or skin necrosis: yes Has patient had a PCN reaction that required hospitalization: no Has patient had a PCN reaction occurring within the last 10 years: no If all of the above answers are "NO", then may proceed with Cephalosporin use.   . Sulfa  Antibiotics Rash    Under arm and thighs   Family History  Problem Relation Age of Onset  . Heart disease Mother   . Osteoporosis Mother   . Cancer Father        prostate  . Osteoporosis Sister   . Gout Brother   . Gout Maternal Grandmother   . Heart disease Maternal Grandmother   . Alzheimer's disease Maternal Grandfather   . Osteoporosis Sister      Past medical history, social, surgical and family history all reviewed in electronic medical record.  No pertanent information unless stated regarding to the chief complaint.   Review of Systems:Review of systems updated and as accurate as of 05/27/17  No headache, visual changes, nausea, vomiting, diarrhea, constipation, dizziness, abdominal pain, skin rash, fevers, chills, night sweats,  weight loss, swollen lymph nodes, body aches, joint swelling, , chest pain, shortness of breath, mood changes.  Positive muscle aches  Objective  There were no vitals taken for this visit. Systems examined below as of 05/27/17   General: No apparent distress alert and oriented x3 mood and affect normal, dressed appropriately.  HEENT: Pupils equal, extraocular movements intact  Respiratory: Patient's speak in full sentences and does not appear short of breath  Cardiovascular: No lower extremity edema, non tender, no erythema  Skin: Warm dry intact with no signs of infection or rash on extremities or on axial skeleton.  Abdomen: Soft nontender  Neuro: Cranial nerves II through XII are intact, neurovascularly intact in all extremities with 2+ DTRs and 2+ pulses.  Lymph: No lymphadenopathy of posterior or anterior cervical chain or axillae bilaterally.  Gait antalgic MSK: Mild tender with full range of motion and good stability and symmetric strength and tone of shoulders, elbows, wrist, hip, knee and ankles bilaterally.  Arthritic changes of multiple joints  Back Exam:  Inspection: Significant degenerative scoliosis Motion: Flexion 25 deg, Extension 15 deg, Side Bending to 42 deg bilaterally,  Rotation to 25 deg bilaterally  SLR laying: The right side XSLR laying: Negative  Palpable tenderness: Tender to palpation in the paraspinal musculature.Marland Kitchen FABER: Reviewed tightness bilaterally. Sensory change: Gross sensation intact to all lumbar and sacral dermatomes.  Reflexes: 2+ at both patellar tendons, 2+ at achilles tendons, Babinski's downgoing.  Strength at foot  4 out of 5 but symmetric   Osteopathic findings C2 flexed rotated and side bent right T3 extended rotated and side bent right inhaled third rib L2 flexed rotated and side bent right Sacrum right on right     Impression and Recommendations:     This case required medical decision making of moderate complexity.       Note: This dictation was prepared with Dragon dictation along with smaller phrase technology. Any transcriptional errors that result from this process are unintentional.

## 2017-05-28 ENCOUNTER — Ambulatory Visit (INDEPENDENT_AMBULATORY_CARE_PROVIDER_SITE_OTHER): Payer: Medicare Other | Admitting: Family Medicine

## 2017-05-28 ENCOUNTER — Encounter: Payer: Self-pay | Admitting: Family Medicine

## 2017-05-28 VITALS — BP 98/70 | HR 80 | Wt 105.0 lb

## 2017-05-28 DIAGNOSIS — M5416 Radiculopathy, lumbar region: Secondary | ICD-10-CM

## 2017-05-28 DIAGNOSIS — M5431 Sciatica, right side: Secondary | ICD-10-CM | POA: Diagnosis not present

## 2017-05-28 DIAGNOSIS — M999 Biomechanical lesion, unspecified: Secondary | ICD-10-CM

## 2017-05-28 NOTE — Assessment & Plan Note (Signed)
Patient is having worsening symptoms at this time.  Patient is having radicular symptoms.  Increasing pain.  Patient has had loss of motion.  Not responding as well to the osteopathic manipulation.  We discussed icing regimen and home exercises.  We discussed which activities to do which ones to avoid.  I do believe the patient has failed conservative therapy to a certain degree at this time and advanced imaging would be warranted.  Would be a candidate for epidural injections.  Patient's depending on MRI findings then we will discussed different treatment options.

## 2017-05-28 NOTE — Patient Instructions (Signed)
Good to see you  Lets get an MRI of the back  We will call you with results and discuss epidural  IF we do the epidural then I would like to see you again 2-3 weeks AFTER the injections Happy holidays!

## 2017-05-28 NOTE — Assessment & Plan Note (Signed)
Decision today to treat with OMT was based on Physical Exam  After verbal consent patient was treated with HVLA, ME, FPR techniques in cervical, thoracic, rib,  lumbar and sacral areas  Patient tolerated the procedure well with improvement in symptoms  Patient given exercises, stretches and lifestyle modifications  See medications in patient instructions if given  Patient will follow up in 4-8 weeks 

## 2017-06-20 ENCOUNTER — Ambulatory Visit
Admission: RE | Admit: 2017-06-20 | Discharge: 2017-06-20 | Disposition: A | Discharge status: Home / Self Care | Payer: Medicare Other | Source: Ambulatory Visit | Attending: Family Medicine | Admitting: Family Medicine

## 2017-06-20 DIAGNOSIS — M5416 Radiculopathy, lumbar region: Secondary | ICD-10-CM

## 2017-06-20 DIAGNOSIS — M48061 Spinal stenosis, lumbar region without neurogenic claudication: Secondary | ICD-10-CM | POA: Diagnosis not present

## 2017-07-17 ENCOUNTER — Other Ambulatory Visit: Payer: Self-pay | Admitting: *Deleted

## 2017-07-17 ENCOUNTER — Telehealth: Payer: Self-pay | Admitting: Family Medicine

## 2017-07-17 ENCOUNTER — Other Ambulatory Visit: Payer: Self-pay

## 2017-07-17 MED ORDER — ESTRING 2 MG VA RING: 2.0000 mg | VAGINAL_RING | VAGINAL | 0 refills | Status: DC

## 2017-07-17 MED ORDER — OMEPRAZOLE 20 MG PO CPDR: 20.0000 mg | DELAYED_RELEASE_CAPSULE | Freq: Every day | ORAL | 0 refills | Status: DC

## 2017-07-17 NOTE — Telephone Encounter (Signed)
Rx refilled at new pharmacy- for 1 3 month supply- per protocol- patient due follow up- gyn- 4/18, GI 3/18 Patient scheduled for appointment

## 2017-07-17 NOTE — Telephone Encounter (Signed)
Copied from [place] [id number]. Topic: Quick Communication - Rx Refill/Question >> Jul 17, 2017 11:28 AM Malena Catholic I, NT wrote: Medication: Prilosec 20 Mg Estring 2 Mg Vaginal Ring    Has the patient contacted their pharmacy yes    (Agent: If no, request that the patient contact the pharmacy for the refill  Pt    Preferred Pharmacy (with phone number or street name [facility] [phone]  Fax [phone]   Agent: Please be advised that RX refills may take up to 3 business days. We ask that you follow-up with your pharmacy.

## 2017-07-19 ENCOUNTER — Telehealth: Payer: Self-pay | Admitting: Family Medicine

## 2017-07-19 NOTE — Telephone Encounter (Signed)
Copied from [place] [phone]. Topic: Quick Communication - See Telephone Encounter >> Jul 19, 2017 11:06 AM Robina Ade, Helene Kelp D wrote: Patient called and would like to know about her MRI results she had done 4 weeks ago. Please call patient back, thanks. CRM for notification. See Telephone encounter for: 07/19/17.

## 2017-07-22 NOTE — Telephone Encounter (Signed)
lmovm for pt to return call.  

## 2017-11-06 ENCOUNTER — Other Ambulatory Visit (HOSPITAL_COMMUNITY)
Admission: RE | Admit: 2017-11-06 | Discharge: 2017-11-06 | Disposition: A | Discharge status: Home / Self Care | Payer: Medicare Other | Source: Ambulatory Visit | Attending: Family Medicine | Admitting: Family Medicine

## 2017-11-06 ENCOUNTER — Encounter: Payer: Self-pay | Admitting: Family Medicine

## 2017-11-06 ENCOUNTER — Ambulatory Visit (INDEPENDENT_AMBULATORY_CARE_PROVIDER_SITE_OTHER): Payer: Medicare Other | Admitting: Family Medicine

## 2017-11-06 VITALS — BP 90/59 | HR 84 | Temp 96.0°F | Resp 20 | Ht 66.0 in | Wt 103.0 lb

## 2017-11-06 DIAGNOSIS — Z1231 Encounter for screening mammogram for malignant neoplasm of breast: Secondary | ICD-10-CM | POA: Diagnosis not present

## 2017-11-06 DIAGNOSIS — N952 Postmenopausal atrophic vaginitis: Secondary | ICD-10-CM | POA: Insufficient documentation

## 2017-11-06 DIAGNOSIS — K219 Gastro-esophageal reflux disease without esophagitis: Secondary | ICD-10-CM | POA: Diagnosis not present

## 2017-11-06 DIAGNOSIS — B373 Candidiasis of vulva and vagina: Secondary | ICD-10-CM | POA: Diagnosis not present

## 2017-11-06 DIAGNOSIS — M810 Age-related osteoporosis without current pathological fracture: Secondary | ICD-10-CM

## 2017-11-06 DIAGNOSIS — N898 Other specified noninflammatory disorders of vagina: Secondary | ICD-10-CM | POA: Insufficient documentation

## 2017-11-06 DIAGNOSIS — Z1239 Encounter for other screening for malignant neoplasm of breast: Secondary | ICD-10-CM

## 2017-11-06 MED ORDER — ESTRING 2 MG VA RING: 2.0000 mg | VAGINAL_RING | VAGINAL | 3 refills | Status: DC

## 2017-11-06 MED ORDER — OMEPRAZOLE 20 MG PO CPDR: 20.0000 mg | DELAYED_RELEASE_CAPSULE | Freq: Every day | ORAL | 3 refills | Status: DC

## 2017-11-06 MED ORDER — OMEPRAZOLE 20 MG PO CPDR: 20.0000 mg | DELAYED_RELEASE_CAPSULE | Freq: Every day | ORAL | 0 refills | Status: DC

## 2017-11-06 MED ORDER — DENOSUMAB 60 MG/ML ~~LOC~~ SOSY: 60.0000 mg | PREFILLED_SYRINGE | Freq: Once | SUBCUTANEOUS | 0 refills | Status: AC

## 2017-11-06 NOTE — Patient Instructions (Addendum)
Every 6 months for prolia injections. I have refilled your medications.   I have ordered your mammogram and bone density.

## 2017-11-06 NOTE — Progress Notes (Signed)
Kelsey [name] , 02-Mar-1946, 72 y.o., female MRN: [id number] Patient Care Team    Relationship Specialty Notifications Start End  [name], DO PCP - General Family Medicine  05/27/15   [name], MD Consulting Physician Gastroenterology  12/08/15   Calvert Cantor, MD Consulting Physician Ophthalmology  12/08/15     Chief Complaint  Patient presents with  . Follow-up    osteoporosis,estrogen therapy,GERD     Subjective:  Hormone therpay/atrophic vaginitis: - increase vaginal discharge reported recently. No vaginal irritation. Patient presents for annual pelvic exam d/t estrogen therpay. 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 Pelvic completed last year and normal.  Her last mammogram was 08/2016 (in [facility] system) and normal-reviewed in care everywhere. Patient feels her estrogen replacement has helped greatly with her vaginal dryness and atrophy changes. She denies any dyspareunia, vaginal irritation or vaginal bleeding. Patient does have a family history of heart disease and osteoporosis. No breast cancer family history.   Osteoporosis: prolia injection is overdue. Completed today. Bone density ordered.   GERD: controlled prilosec 20 mg QD.   Depression screen [facility] 2/9 11/06/2017 12/17/2016 12/07/2016 12/08/2015 12/02/2013  Decreased Interest 0 0 0 0 0  Down, Depressed, Hopeless 0 0 0 0 1  PHQ - 2 Score 0 0 0 0 1    Allergies  Allergen Reactions  . Penicillins Rash    Has patient had a PCN reaction causing immediate rash, facial/tongue/throat swelling, SOB or lightheadedness with hypotension: no Has patient had a PCN reaction causing severe rash involving mucus membranes or skin necrosis: yes Has patient had a PCN reaction that required hospitalization: no Has patient had a PCN reaction occurring within the last 10 years: no If all of the above answers are "NO", then may proceed with Cephalosporin use.   . Sulfa Antibiotics Rash   Under arm and thighs   Social History   Tobacco Use  . Smoking status: Former Smoker    Packs/day: 0.30    Years: 10.00    Pack years: 3.00    Types: Cigarettes    Last attempt to quit: 07/09/1978    Years since quitting: 39.3  . Smokeless tobacco: Never Used  Substance Use Topics  . Alcohol use: Yes    Comment: 1-2 glasses of wine daily and a 12 oz beer daily   Past Medical History:  Diagnosis Date  . Allergy   . Anemia    iron deficiency  . 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 Bladder prolapse, female, acquired   . Chicken pox as a child  . Dysuria 09/13/2011  . Female bladder prolapse 09/13/2011  . Fibrocystic breast 09/14/2011  . GERD (gastroesophageal reflux disease)   . H/O measles   . H/O mumps   . Hyperlipidemia   . 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 Osteoarthrosis    right shoulder  . Osteoporosis    reclast in Oct  . Ovarian cyst 09/14/2011  . [facility] spotted fever) 2017  . Uterine fibroid 11/2012   See Dr. Cletis Media note, surgery 11/2012  . Vaginitis 09/13/2011   Past Surgical History:  Procedure Laterality Date  . DILATATION & CURRETTAGE/HYSTEROSCOPY WITH RESECTOCOPE N/A 11/06/2012   Procedure: DILATATION & CURETTAGE/HYSTEROSCOPY WITH RESECTOCOPE;  Surgeon: Alwyn Pea, MD;  Location: [facility];  Service: Gynecology;  Laterality: N/A;  . HERNIA REPAIR  1992  . small intestine hernia  1994  . WISDOM TOOTH EXTRACTION     Family History  Problem  Relation Age of Onset  . Heart disease Mother   . Osteoporosis Mother   . Cancer Father        prostate  . Osteoporosis Sister   . Gout Brother   . Gout Maternal Grandmother   . Heart disease Maternal Grandmother   . Alzheimer's disease Maternal Grandfather   . Osteoporosis Sister    Allergies as of 11/06/2017      Reactions   Penicillins Rash   Has patient had a PCN reaction causing immediate rash, facial/tongue/throat swelling, SOB or lightheadedness with hypotension: no Has patient had a PCN reaction causing  severe rash involving mucus membranes or skin necrosis: yes Has patient had a PCN reaction that required hospitalization: no Has patient had a PCN reaction occurring within the last 10 years: no If all of the above answers are "NO", then may proceed with Cephalosporin use.   Sulfa Antibiotics Rash   Under arm and thighs      Medication List        Accurate as of 11/06/17  9:37 AM. Always use your most recent med list.          Biotin 10 MG Tabs Take 1 tablet by mouth daily.   CALCIUM + D3 PO Take 600 mg by mouth daily.   ESTRING 2 MG vaginal ring Generic drug:  estradiol Place 2 mg vaginally every 3 (three) months.   Fish Oil 1000 MG Caps Take 1 capsule by mouth daily.   Glucosamine 750 MG Tabs Take 1,200 mg by mouth daily.   MAGNESIA PO Take 1 tablet by mouth daily. OTC   omeprazole 20 MG capsule Commonly known as:  PRILOSEC Take 1 capsule (20 mg total) by mouth daily.   Vitamin D-3 1000 units Caps Take 5,000 Units by mouth.       All past medical history, surgical history, allergies, family history, immunizations andmedications were updated in the EMR today and reviewed under the history and medication portions of their EMR.     ROS: Negative, with the exception of above mentioned in HPI   Objective:  BP (!) 90/59 (BP Location: Left Arm, Patient Position: Sitting, Cuff Size: Normal)   Pulse 84   Temp (!) 96 F (35.6 C)   Resp 20   Ht 5\' 6"  (1.676 m)   Wt 103 lb (46.7 kg)   SpO2 97%   BMI 16.62 kg/m  Body mass index is 16.62 kg/m. Gen: Afebrile. No acute distress. Nontoxic in appearance.  Well-developed, well-nourished, very pleasant thin [place] female. HENT: AT. [place].  MMM.  Eyes:Pupils Equal Round Reactive to light, Extraocular movements intact,  Conjunctiva without redness, discharge or icterus. Neck/lymp/endocrine: Supple, no lymphadenopathy, no thyromegaly CV: RRR no murmur, no edema, +2/4 P posterior tibialis pulses Chest: CTAB, no wheeze or  crackles Abd: Soft. NTND. BS positive Skin: no rashes, purpura or petechiae.  Neuro: Normal gait. PERLA. EOMi. Alert. Oriented x3  Psych: Normal affect, dress and demeanor. Normal speech. Normal thought content and judgment.   No exam data present No results found. No results found for this or any previous visit (from the past 24 hour(s)).  Assessment/Plan: Kelsey [name] is a 72 y.o. female present for OV for  Hormone replacement therapy/atrophic vaginitis/vaginal discharge - Discussed routine pelvic/Pap and mammograms with patient today. Discussed although the ring is very low estrogen, there still is some possibility of increased chance that cardiovascular disease, breast cancer. - Yearly pelvic exam completed today. - Vaginal discharge--> Cervicovaginal ancillary only-  Refills on ESTRING 2mg  every 90 days, x3 refills.  - Follow-up 12 months.  Osteoporosis without current pathological fracture, unspecified osteoporosis type Prolia every 6 months. - DG Bone Density; Future every 2- 3 years - denosumab (PROLIA) 60 MG/ML SOSY injection; Inject 60 mg into the skin once for 1 dose.  Dispense: 1 mL; Refill: 0  Gastroesophageal reflux disease without esophagitis Controlled.  - omeprazole (PRILOSEC) 20 MG capsule; Take 1 capsule (20 mg total) by mouth daily.  Dispense: 90 capsule; Refill: 3  Breast cancer screening - MM 3D SCREEN BREAST BILATERAL; Future   Reviewed expectations re: course of current medical issues.  Discussed self-management of symptoms.  Outlined signs and symptoms indicating need for more acute intervention.  Patient verbalized understanding and all questions were answered.  Patient received an After-Visit Summary.   Note is dictated utilizing voice recognition software. Although note has been proof read prior to signing, occasional typographical errors still can be missed. If any questions arise, please do not hesitate to call for verification.    electronically signed by:  Howard Pouch, DO  [place]

## 2017-11-08 ENCOUNTER — Telehealth: Payer: Self-pay | Admitting: Family Medicine

## 2017-11-08 ENCOUNTER — Telehealth: Payer: Self-pay | Admitting: *Deleted

## 2017-11-08 ENCOUNTER — Encounter: Payer: Self-pay | Admitting: Family Medicine

## 2017-11-08 LAB — CERVICOVAGINAL ANCILLARY ONLY
Bacterial vaginitis: NEGATIVE
Candida vaginitis: POSITIVE — AB
TRICH (WINDOWPATH): NEGATIVE

## 2017-11-08 MED ORDER — FLUCONAZOLE 150 MG PO TABS: 150.0000 mg | ORAL_TABLET | Freq: Once | ORAL | 0 refills | Status: AC

## 2017-11-08 NOTE — Telephone Encounter (Signed)
Left detailed message with results and instructions on patient voice mail per DPR 

## 2017-11-08 NOTE — Telephone Encounter (Signed)
Please inform patient the following information: Her pelvic exam did show evidence of yeast infection. I have called in a one time pill of diflucan to treat.

## 2017-11-08 NOTE — Telephone Encounter (Signed)
Spoke with patient reviewed information and instructions patient will try OTC meds .

## 2017-11-08 NOTE — Telephone Encounter (Signed)
Pt calling requesting a round of prednisone for her back pain. She did not mention her back pain during her visit this past week.  Please explain to pt prednisone is a prescribed medication and would need an appt to discuss need, discuss risk and benefits, evaluate her complaint with a physical exam etc.  I would recommend she rest back, try a heating pad,  try OTC nsaids first, such as advil or aleve.

## 2017-11-25 ENCOUNTER — Ambulatory Visit: Payer: Self-pay

## 2017-11-25 NOTE — Telephone Encounter (Signed)
PC from pt.  Reported she has attempted to remove an E string vaginal ring, but unable to reach it.  Denied any pain, fever, chills, or discharge.  Stated she has tried to remove it in a lying position.  Reported can feel the ring, but unable to get her finger around it to pull it out.  Appt. Scheduled for 11/27/17 at 9:00 AM.  (declined an appt. offered on 5/21)   Pt. Advised to return call if develops pain, fever/ chills, or vaginal discharge.  Verb. Understanding; agreed with plan.      Reason for Disposition . [1] Vaginal FB AND [2] can't remove at home    Reported has E string vaginal ring; inserted this herself about 3 mos. Ago and is trying to change this now.  Unable to reach this; stated "it is up too high; can't get my finger into the ring."  Answer Assessment - Initial Assessment Questions 1. OBJECT: "What do you think it is?"      Vaginal Ring 2. SIZE: "How large is it?"      n/a 3. PAIN: "Are you having any pain?" If so, ask: "How bad is it?"  (Scale 1-10; or mild, moderate, severe)     no 4. ONSET: "How long do you think the foreign body has been there?"      3 mos. Per MD recommendation 5. MECHANISM: "Tell me how it happened."     Inserted vaginally approx. 3 mos. ago 6. OTHER SYMPTOMS: "Do you have any other symptoms?" (e.g., vaginal discharge, fever, rash)     Denied fever/ chills or discharge  Protocols used: VAGINAL FOREIGN BODY-A-AH

## 2017-11-27 ENCOUNTER — Ambulatory Visit (INDEPENDENT_AMBULATORY_CARE_PROVIDER_SITE_OTHER): Payer: Medicare Other | Admitting: Family Medicine

## 2017-11-27 ENCOUNTER — Encounter: Payer: Self-pay | Admitting: Family Medicine

## 2017-11-27 VITALS — BP 103/71 | HR 69 | Temp 97.6°F | Resp 20 | Ht 66.0 in | Wt 107.0 lb

## 2017-11-27 DIAGNOSIS — R102 Pelvic and perineal pain: Secondary | ICD-10-CM

## 2017-11-27 DIAGNOSIS — T192XXA Foreign body in vulva and vagina, initial encounter: Secondary | ICD-10-CM

## 2017-11-27 NOTE — Patient Instructions (Signed)
You should have refills. They were sent to the [facility] for a year. Follow up yearly on this issue.

## 2017-11-27 NOTE — Progress Notes (Signed)
Kelsey [name] , [birth year], 72 y.o., female MRN: [id number] Patient Care Team    Relationship Specialty Notifications Start End  Kelsey Hillock, DO PCP - General Family Medicine  05/27/15   Kelsey Craver, MD Consulting Physician Gastroenterology  12/08/15   Kelsey Cantor, MD Consulting Physician Ophthalmology  12/08/15     Chief Complaint  Patient presents with  . Foreign Body in Vagina    E string      Subjective: Pt presents for an OV with complaints of vaginal ring retention.  Patient reports she has had her current Estring vaginal ring placed for 4 months and she has been unable to remove it.  She states she cannot reach the ring.  Is a new problem for her, she has been on Estring vaginal ring for a few years without difficulty and using and removing.  Mild discomfort when attempting to remove ring only.   Depression screen [facility] 2/9 11/06/2017 12/17/2016 12/07/2016 12/08/2015 12/02/2013  Decreased Interest 0 0 0 0 0  Down, Depressed, Hopeless 0 0 0 0 1  PHQ - 2 Score 0 0 0 0 1    Allergies  Allergen Reactions  . Penicillins Rash    Has patient had a PCN reaction causing immediate rash, facial/tongue/throat swelling, SOB or lightheadedness with hypotension: no Has patient had a PCN reaction causing severe rash involving mucus membranes or skin necrosis: yes Has patient had a PCN reaction that required hospitalization: no Has patient had a PCN reaction occurring within the last 10 years: no If all of the above answers are "NO", then may proceed with Cephalosporin use.   . Sulfa Antibiotics Rash    Under arm and thighs   Social History   Tobacco Use  . Smoking status: Former Smoker    Packs/day: 0.30    Years: 10.00    Pack years: 3.00    Types: Cigarettes    Last attempt to quit: 07/09/1978    Years since quitting: 39.4  . Smokeless tobacco: Never Used  Substance Use Topics  . Alcohol use: Yes    Comment: 1-2 glasses of wine daily and a 12 oz beer daily   Past Medical  History:  Diagnosis Date  . Allergy   . Anemia    iron deficiency  . 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 Bladder prolapse, female, acquired   . Chicken pox as a child  . Dysuria 09/13/2011  . Female bladder prolapse 09/13/2011  . Fibrocystic breast 09/14/2011  . GERD (gastroesophageal reflux disease)   . H/O measles   . H/O mumps   . Hyperlipidemia   . 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 Osteoarthrosis    right shoulder  . Osteoporosis    reclast in Oct  . Ovarian cyst 09/14/2011  . [facility] spotted fever) 2017  . Uterine fibroid 11/2012   See Dr. Cletis Media note, surgery 11/2012  . Vaginitis 09/13/2011   Past Surgical History:  Procedure Laterality Date  . DILATATION & CURRETTAGE/HYSTEROSCOPY WITH RESECTOCOPE N/A 11/06/2012   Procedure: DILATATION & CURETTAGE/HYSTEROSCOPY WITH RESECTOCOPE;  Surgeon: Kelsey Pea, MD;  Location: [facility];  Service: Gynecology;  Laterality: N/A;  . HERNIA REPAIR  1992  . small intestine hernia  1994  . WISDOM TOOTH EXTRACTION     Family History  Problem Relation Age of Onset  . Heart disease Mother   . Osteoporosis Mother   . Cancer Father        prostate  . Osteoporosis Sister   .  Gout Brother   . Gout Maternal Grandmother   . Heart disease Maternal Grandmother   . Alzheimer's disease Maternal Grandfather   . Osteoporosis Sister    Allergies as of 11/27/2017      Reactions   Penicillins Rash   Has patient had a PCN reaction causing immediate rash, facial/tongue/throat swelling, SOB or lightheadedness with hypotension: no Has patient had a PCN reaction causing severe rash involving mucus membranes or skin necrosis: yes Has patient had a PCN reaction that required hospitalization: no Has patient had a PCN reaction occurring within the last 10 years: no If all of the above answers are "NO", then may proceed with Cephalosporin use.   Sulfa Antibiotics Rash   Under arm and thighs      Medication List        Accurate as of 11/27/17  9:27 AM.  Always use your most recent med list.          Biotin 10 MG Tabs Take 1 tablet by mouth daily.   CALCIUM + D3 PO Take 600 mg by mouth daily.   ESTRING 2 MG vaginal ring Generic drug:  estradiol Place 2 mg vaginally every 3 (three) months.   Fish Oil 1000 MG Caps Take 1 capsule by mouth daily.   Glucosamine 750 MG Tabs Take 1,200 mg by mouth daily.   MAGNESIA PO Take 1 tablet by mouth daily. OTC   omeprazole 20 MG capsule Commonly known as:  PRILOSEC Take 1 capsule (20 mg total) by mouth daily.   Vitamin D-3 1000 units Caps Take 5,000 Units by mouth.       All past medical history, surgical history, allergies, family history, immunizations andmedications were updated in the EMR today and reviewed under the history and medication portions of their EMR.     ROS: Negative, with the exception of above mentioned in HPI   Objective:  BP 103/71 (BP Location: Right Arm, Patient Position: Sitting, Cuff Size: Normal)   Pulse 69   Temp 97.6 F (36.4 C)   Resp 20   Ht 5\' 6"  (1.676 m)   Wt 107 lb (48.5 kg)   SpO2 97%   BMI 17.27 kg/m  Body mass index is 17.27 kg/m. Gen: Afebrile. No acute distress. Nontoxic in appearance, well developed, well nourished.  Abd: Soft.  Flat. NTND. BS +.  No masses palpated. No rebound or guarding.  Neuro:  Normal gait. PERLA. EOMi. Alert. Oriented x3  Female genitalia: normal external genitalia, vulva, vagina, cervix, uterus and adnexa Vaginal ring easily identified with speculum exam.  Ring forcep was used to grasp vaginal ring and remove easily.  Patient tolerated procedure well.  New vaginal ring was placed for her at this time.  No exam data present No results found. No results found for this or any previous visit (from the past 24 hour(s)).  Assessment/Plan: Kelsey [name] is a 72 y.o. female present for OV for  Vaginal foreign body, initial encounter/Pelvic pain -Patient unable to remove her vaginal ring, prescribed for  estrogen therapy.  Vaginal ring removed with speculum exam and ring forceps today.  Patient tolerated well. -New vaginal ring was placed for patient today. -Follow-up PRN  Reviewed expectations re: course of current medical issues.  Discussed self-management of symptoms.  Outlined signs and symptoms indicating need for more acute intervention.  Patient verbalized understanding and all questions were answered.  Patient received an After-Visit Summary.    No orders of the defined types were placed in  this encounter.    Note is dictated utilizing voice recognition software. Although note has been proof read prior to signing, occasional typographical errors still can be missed. If any questions arise, please do not hesitate to call for verification.   electronically signed by:  Howard Pouch, DO  [place]

## 2017-12-05 ENCOUNTER — Encounter: Payer: Self-pay | Admitting: *Deleted

## 2017-12-05 DIAGNOSIS — M810 Age-related osteoporosis without current pathological fracture: Secondary | ICD-10-CM | POA: Diagnosis not present

## 2017-12-05 DIAGNOSIS — Z1231 Encounter for screening mammogram for malignant neoplasm of breast: Secondary | ICD-10-CM | POA: Diagnosis not present

## 2017-12-05 DIAGNOSIS — M8588 Other specified disorders of bone density and structure, other site: Secondary | ICD-10-CM | POA: Diagnosis not present

## 2017-12-05 LAB — HM DEXA SCAN

## 2017-12-05 LAB — HM MAMMOGRAPHY

## 2017-12-06 ENCOUNTER — Encounter: Payer: Self-pay | Admitting: *Deleted

## 2017-12-09 ENCOUNTER — Telehealth: Payer: Self-pay | Admitting: Family Medicine

## 2017-12-09 NOTE — Telephone Encounter (Signed)
Please inform patient the following information: Her mammogram and bone density results  are stable. 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

## 2017-12-09 NOTE — Telephone Encounter (Signed)
Spoke with patient reviewed mammogram and dexa scan results . Patient verbalized understanding.

## 2017-12-11 ENCOUNTER — Ambulatory Visit (INDEPENDENT_AMBULATORY_CARE_PROVIDER_SITE_OTHER): Payer: Medicare Other | Admitting: Family Medicine

## 2017-12-11 ENCOUNTER — Encounter: Payer: Self-pay | Admitting: Family Medicine

## 2017-12-11 VITALS — BP 103/63 | HR 72 | Temp 98.5°F | Resp 20 | Ht 66.0 in | Wt 107.5 lb

## 2017-12-11 DIAGNOSIS — M654 Radial styloid tenosynovitis [de Quervain]: Secondary | ICD-10-CM

## 2017-12-11 DIAGNOSIS — Z298 Encounter for other specified prophylactic measures: Secondary | ICD-10-CM

## 2017-12-11 DIAGNOSIS — M79645 Pain in left finger(s): Secondary | ICD-10-CM | POA: Diagnosis not present

## 2017-12-11 HISTORY — DX: Radial styloid tenosynovitis (de quervain): M65.4

## 2017-12-11 MED ORDER — CAPSAICIN 0.075 % EX CREA: 1.0000 "application " | TOPICAL_CREAM | Freq: Two times a day (BID) | CUTANEOUS | 0 refills | Status: DC

## 2017-12-11 MED ORDER — PREDNISONE 20 MG PO TABS: 20.0000 mg | ORAL_TABLET | Freq: Every day | ORAL | 0 refills | Status: DC

## 2017-12-11 NOTE — Patient Instructions (Signed)
Prednisone and capsaicin cream  Called in to your pharmacy.  If not improved or worsens in 4 weeks, then return to follow up and we will consider further work up  The medicine for altitude sickness is called diamox or acetazolamide. Consider using before going to high altitude trips    [employer] Tenosynovitis Tendons attach muscles to bones. They also help with joint movements. When tendons become irritated or swollen, it is called tendinitis. The extensor pollicis brevis (EPB) tendon connects the EPB muscle to a bone that is near the base of the thumb. The EPB muscle helps to straighten and extend the thumb. [place] tenosynovitis is a condition in which the EPB tendon lining (sheath) becomes irritated, thickened, and swollen. This condition is sometimes called stenosing tenosynovitis. This condition causes pain on the thumb side of the back of the wrist. What are the causes? Causes of this condition include:  Activities that repeatedly cause your thumb and wrist to extend.  A sudden increase in activity or change in activity that affects your wrist.  What increases the risk? This condition is more likely to develop in:  Females.  People who have diabetes.  Women who have recently given birth.  People who are over 56 years of age.  People who do activities that involve repeated hand and wrist motions, such as tennis, racquetball, volleyball, gardening, and taking care of children.  People who do heavy labor.  People who have poor wrist strength and flexibility.  People who do not warm up properly before activities.  What are the signs or symptoms? Symptoms of this condition include:  Pain or tenderness over the thumb side of the back of the wrist when your thumb and wrist are not moving.  Pain that gets worse when you straighten your thumb or extend your thumb or wrist.  Pain when the injured area is touched.  Locking or catching of the thumb joint while you bend and  straighten your thumb.  Decreased thumb motion due to pain.  Swelling over the affected area.  How is this diagnosed? This condition is diagnosed with a medical history and physical exam. Your health care provider will ask for details about your injury and ask about your symptoms. How is this treated? Treatment may include the use of icing and medicines to reduce pain and swelling. You may also be advised to wear a splint or brace to limit your thumb and wrist motion. In less severe cases, treatment may also include working with a physical therapist to strengthen your wrist and calm the irritation around your EPB tendon sheath. In severe cases, surgery may be needed. Follow these instructions at home: If you have a splint or brace:  Wear it as told by your health care provider. Remove it only as told by your health care provider.  Loosen the splint or brace if your fingers become numb and tingle, or if they turn cold and blue.  Keep the splint or brace clean and dry. Managing pain, stiffness, and swelling  If directed, apply ice to the injured area. ? Put ice in a plastic bag. ? Place a towel between your skin and the bag. ? Leave the ice on for 20 minutes, 2-3 times per day.  Move your fingers often to avoid stiffness and to lessen swelling.  Raise (elevate) the injured area above the level of your heart while you are sitting or lying down. General instructions  Return to your normal activities as told by your  health care provider. Ask your health care provider what activities are safe for you.  Take over-the-counter and prescription medicines only as told by your health care provider.  Keep all follow-up visits as told by your health care provider. This is important.  Do not drive or operate heavy machinery while taking prescription pain medicine. Contact a health care provider if:  Your pain, tenderness, or swelling gets worse, even if you have had treatment.  You have  numbness or tingling in your wrist, hand, or fingers on the injured side. This information is not intended to replace advice given to you by your health care provider. Make sure you discuss any questions you have with your health care provider. Document Released: 06/25/2005 Document Revised: 12/01/2015 Document Reviewed: 08/31/2014 Elsevier Interactive Patient Education  [employer].

## 2017-12-11 NOTE — Progress Notes (Signed)
Kelsey [name] , 07/21/1945, 72 y.o., female MRN: [id number] Patient Care Team    Relationship Specialty Notifications Start End  [name], DO PCP - General Family Medicine  05/27/15   [name], MD Consulting Physician Gastroenterology  12/08/15   Calvert Cantor, MD Consulting Physician Ophthalmology  12/08/15     Chief Complaint  Patient presents with  . thumb pain    left x 4 weeks     Subjective:    Altitude sickness preventative measures Pt is hoping to plan a trip to [place] this coming year. She has questions surrounding the prevention of altitude sickness.   Pain of left thumb Pt presents for an OV with complaints of acute left thumb pain of 4 weeks duration.  Associated symptoms include pain with ROM and a small area of redness near the proximal joint. She does not recall a specific injury. 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 She denies known arthritis or prior injury/surgery to the area of concern.  Pt has tried nothing to ease their symptoms.   Depression screen [facility] 2/9 11/06/2017 12/17/2016 12/07/2016 12/08/2015 12/02/2013  Decreased Interest 0 0 0 0 0  Down, Depressed, Hopeless 0 0 0 0 1  PHQ - 2 Score 0 0 0 0 1    Allergies  Allergen Reactions  . Penicillins Rash    Has patient had a PCN reaction causing immediate rash, facial/tongue/throat swelling, SOB or lightheadedness with hypotension: no Has patient had a PCN reaction causing severe rash involving mucus membranes or skin necrosis: yes Has patient had a PCN reaction that required hospitalization: no Has patient had a PCN reaction occurring within the last 10 years: no If all of the above answers are "NO", then may proceed with Cephalosporin use.   . Sulfa Antibiotics Rash    Under arm and thighs   Social History   Tobacco Use  . Smoking status: Former Smoker    Packs/day: 0.30    Years: 10.00    Pack years: 3.00    Types: Cigarettes    Last attempt to  quit: 07/09/1978    Years since quitting: 39.4  . Smokeless tobacco: Never Used  Substance Use Topics  . Alcohol use: Yes    Comment: 1-2 glasses of wine daily and a 12 oz beer daily   Past Medical History:  Diagnosis Date  . Allergy   . Anemia    iron deficiency  . 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 Bladder prolapse, female, acquired   . Chicken pox as a child  . Dysuria 09/13/2011  . Female bladder prolapse 09/13/2011  . Fibrocystic breast 09/14/2011  . GERD (gastroesophageal reflux disease)   . H/O measles   . H/O mumps   . Hyperlipidemia   . 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 Osteoarthrosis    right shoulder  . Osteoporosis    reclast in Oct  . Ovarian cyst 09/14/2011  . [facility] spotted fever) 2017  . Uterine fibroid 11/2012   See Dr. Cletis Media note, surgery 11/2012  . Vaginitis 09/13/2011   Past Surgical History:  Procedure Laterality Date  . DILATATION & CURRETTAGE/HYSTEROSCOPY WITH RESECTOCOPE N/A 11/06/2012   Procedure: DILATATION & CURETTAGE/HYSTEROSCOPY WITH RESECTOCOPE;  Surgeon: Alwyn Pea, MD;  Location: [facility];  Service: Gynecology;  Laterality: N/A;  . HERNIA REPAIR  1992  . small intestine hernia  1994  . WISDOM TOOTH EXTRACTION     Family History  Problem Relation Age of Onset  . Heart disease Mother   . Osteoporosis Mother   . Cancer Father        prostate  . Osteoporosis Sister   . Gout Brother   . Gout Maternal Grandmother   . Heart disease Maternal Grandmother   . Alzheimer's disease Maternal Grandfather   . Osteoporosis Sister    Allergies as of 12/11/2017      Reactions   Penicillins Rash   Has patient had a PCN reaction causing immediate rash, facial/tongue/throat swelling, SOB or lightheadedness with hypotension: no Has patient had a PCN reaction causing severe rash involving mucus membranes or skin necrosis: yes Has patient had a PCN reaction that required hospitalization: no Has patient had a PCN reaction occurring within the last 10 years:  no If all of the above answers are "NO", then may proceed with Cephalosporin use.   Sulfa Antibiotics Rash   Under arm and thighs      Medication List        Accurate as of 12/11/17 11:06 AM. Always use your most recent med list.          Biotin 10 MG Tabs Take 1 tablet by mouth daily.   CALCIUM + D3 PO Take 600 mg by mouth daily.   ESTRING 2 MG vaginal ring Generic drug:  estradiol Place 2 mg vaginally every 3 (three) months.   Fish Oil 1000 MG Caps Take 1 capsule by mouth daily.   Glucosamine 750 MG Tabs Take 1,200 mg by mouth daily.   MAGNESIA PO Take 1 tablet by mouth daily. OTC   omeprazole 20 MG capsule Commonly known as:  PRILOSEC Take 1 capsule (20 mg total) by mouth daily.   Vitamin D-3 1000 units Caps Take 5,000 Units by mouth.       All past medical history, surgical history, allergies, family history, immunizations andmedications were updated in the EMR today and reviewed under the history and medication portions of their EMR.     ROS: Negative, with the exception of above mentioned in HPI   Objective:  BP 103/63 (BP Location: Right Arm, Patient Position: Sitting, Cuff Size: Normal)   Pulse 72   Temp 98.5 F (36.9 C)   Resp 20   Ht 5\' 6"  (1.676 m)   Wt 107 lb 8 oz (48.8 kg)   SpO2 97%   BMI 17.35 kg/m  Body mass index is 17.35 kg/m. Gen: Afebrile. No acute distress. Nontoxic in appearance, well developed, well nourished.  HENT: AT. [place]. MMM Eyes:Pupils Equal Round Reactive to light, Extraocular movements intact,  Conjunctiva without redness, discharge or icterus. MSK: very small area of redness over proximal thumb joint. ? Injury. Skin intact. Joint is mildly swollen. TTP. +finklestein. Normal ROM with some mild discomfort. NV intact distally.  Skin: norashes, purpura or petechiae.  Neuro: Normal gait. PERLA. EOMi. Alert. Oriented x3  No exam data present No results found. No results found for this or any previous visit (from the past 24  hour(s)).  Assessment/Plan: Kelsey [name] is a 72 y.o. female present for OV for  Pain of left thumb/De Quervains tenosynovitis Exam consistent with tenosynovitis of left thumb. Options provided and pt agreed to capsaicin cream and prednisone taper.  Rest thumb, may need to try brace in the future, but will try to avoid for now.  If pain does not resolve or worsens, f/u 2-4 weeks and xray/daily med and brace will be considered.   Altitude sickness preventative  measures Discussed options with her today.  Encouraged her to try [place] prior to trip. Since discussed, would be happy to call in for her if she plans her trip.    Reviewed expectations re: course of current medical issues.  Discussed self-management of symptoms.  Outlined signs and symptoms indicating need for more acute intervention.  Patient verbalized understanding and all questions were answered.  Patient received an After-Visit Summary.    No orders of the defined types were placed in this encounter.    Note is dictated utilizing voice recognition software. Although note has been proof read prior to signing, occasional typographical errors still can be missed. If any questions arise, please do not hesitate to call for verification.   electronically signed by:  Howard Pouch, DO  [place]

## 2018-01-13 ENCOUNTER — Ambulatory Visit (INDEPENDENT_AMBULATORY_CARE_PROVIDER_SITE_OTHER): Payer: Medicare Other | Admitting: Family Medicine

## 2018-01-13 ENCOUNTER — Encounter: Payer: Self-pay | Admitting: Family Medicine

## 2018-01-13 VITALS — BP 92/55 | HR 68 | Temp 98.6°F | Resp 20 | Ht 66.0 in | Wt 107.0 lb

## 2018-01-13 DIAGNOSIS — M654 Radial styloid tenosynovitis [de Quervain]: Secondary | ICD-10-CM

## 2018-01-13 DIAGNOSIS — M79645 Pain in left finger(s): Secondary | ICD-10-CM

## 2018-01-13 DIAGNOSIS — G8929 Other chronic pain: Secondary | ICD-10-CM | POA: Diagnosis not present

## 2018-01-13 MED ORDER — MELOXICAM 15 MG PO TABS: 15.0000 mg | ORAL_TABLET | Freq: Every day | ORAL | 0 refills | Status: DC

## 2018-01-13 NOTE — Patient Instructions (Signed)
Wear wrist splint when not showering or in water. Start mobic once daily with largest meal.  Please have xray completed, stop at front desk and Diane will help set I tup for you.    Once I get xray result, we will call you and discuss any follow up or referrals needed.

## 2018-01-13 NOTE — Progress Notes (Signed)
Kelsey [name] , 08-07-1945, 72 y.o., female MRN: [id number] Patient Care Team    Relationship Specialty Notifications Start End  [name], DO PCP - General Family Medicine  05/27/15   [name], MD Consulting Physician Gastroenterology  12/08/15   Calvert Cantor, MD Consulting Physician Ophthalmology  12/08/15     Chief Complaint  Patient presents with  . Follow-up    thumb pain     Subjective:  Pain of left thumb/de Quervain's tenosynovitis She returns today to discuss her left thumb pain.  She feels it is still the same.  Try the capsaicin cream and the prednisone taper which did not seem to have great benefit to her thumb.  Her other areas of arthritis felt very good after prednisone.  Reports the thumb is still swollen and painful.  Prior note 12/11/2017 Pt presents for an OV with complaints of acute left thumb pain of 4 weeks duration.  Associated symptoms include pain with ROM and a small area of redness near the proximal joint. She does not recall a specific injury. 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 She denies known arthritis or prior injury/surgery to the area of concern.  Pt has tried nothing to ease their symptoms.   Depression screen [facility] 2/9 11/06/2017 12/17/2016 12/07/2016 12/08/2015 12/02/2013  Decreased Interest 0 0 0 0 0  Down, Depressed, Hopeless 0 0 0 0 1  PHQ - 2 Score 0 0 0 0 1    Allergies  Allergen Reactions  . Penicillins Rash    Has patient had a PCN reaction causing immediate rash, facial/tongue/throat swelling, SOB or lightheadedness with hypotension: no Has patient had a PCN reaction causing severe rash involving mucus membranes or skin necrosis: yes Has patient had a PCN reaction that required hospitalization: no Has patient had a PCN reaction occurring within the last 10 years: no If all of the above answers are "NO", then may proceed with Cephalosporin use.   . Sulfa Antibiotics Rash    Under arm and thighs    Social History   Tobacco Use  . Smoking status: Former Smoker    Packs/day: 0.30    Years: 10.00    Pack years: 3.00    Types: Cigarettes    Last attempt to quit: 07/09/1978    Years since quitting: 39.5  . Smokeless tobacco: Never Used  Substance Use Topics  . Alcohol use: Yes    Comment: 1-2 glasses of wine daily and a 12 oz beer daily   Past Medical History:  Diagnosis Date  . Allergy   . Anemia    iron deficiency  . 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 Bladder prolapse, female, acquired   . Chicken pox as a child  . Dysuria 09/13/2011  . Female bladder prolapse 09/13/2011  . Fibrocystic breast 09/14/2011  . GERD (gastroesophageal reflux disease)   . H/O measles   . H/O mumps   . Hyperlipidemia   . 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 Osteoarthrosis    right shoulder  . Osteoporosis    reclast in Oct  . Ovarian cyst 09/14/2011  . [facility] spotted fever) 2017  . Uterine fibroid 11/2012   See Dr. Cletis Media note, surgery 11/2012  . Vaginitis 09/13/2011   Past Surgical History:  Procedure Laterality Date  . DILATATION & CURRETTAGE/HYSTEROSCOPY WITH RESECTOCOPE N/A 11/06/2012   Procedure: DILATATION & CURETTAGE/HYSTEROSCOPY WITH RESECTOCOPE;  Surgeon: Alwyn Pea, MD;  Location: [facility];  Service: Gynecology;  Laterality: N/A;  . HERNIA REPAIR  1992  . small intestine hernia  1994  . WISDOM TOOTH EXTRACTION     Family History  Problem Relation Age of Onset  . Heart disease Mother   . Osteoporosis Mother   . Cancer Father        prostate  . Osteoporosis Sister   . Gout Brother   . Gout Maternal Grandmother   . Heart disease Maternal Grandmother   . Alzheimer's disease Maternal Grandfather   . Osteoporosis Sister    Allergies as of 01/13/2018      Reactions   Penicillins Rash   Has patient had a PCN reaction causing immediate rash, facial/tongue/throat swelling, SOB or lightheadedness with hypotension: no Has patient had a PCN reaction causing severe rash involving  mucus membranes or skin necrosis: yes Has patient had a PCN reaction that required hospitalization: no Has patient had a PCN reaction occurring within the last 10 years: no If all of the above answers are "NO", then may proceed with Cephalosporin use.   Sulfa Antibiotics Rash   Under arm and thighs      Medication List        Accurate as of 01/13/18  3:54 PM. Always use your most recent med list.          Biotin 10 MG Tabs Take 1 tablet by mouth daily.   CALCIUM + D3 PO Take 600 mg by mouth daily.   capsicum 0.075 % topical cream Commonly known as:  ZOSTRIX Apply 1 application topically 2 (two) times daily.   ESTRING 2 MG vaginal ring Generic drug:  estradiol Place 2 mg vaginally every 3 (three) months.   Fish Oil 1000 MG Caps Take 1 capsule by mouth daily.   Glucosamine 750 MG Tabs Take 1,200 mg by mouth daily.   MAGNESIA PO Take 1 tablet by mouth daily. OTC   omeprazole 20 MG capsule Commonly known as:  PRILOSEC Take 1 capsule (20 mg total) by mouth daily.   Vitamin D-3 1000 units Caps Take 5,000 Units by mouth.       All past medical history, surgical history, allergies, family history, immunizations andmedications were updated in the EMR today and reviewed under the history and medication portions of their EMR.     ROS: Negative, with the exception of above mentioned in HPI   Objective:  BP (!) 92/55 (BP Location: Right Arm, Patient Position: Sitting, Cuff Size: Normal)   Pulse 68   Temp 98.6 F (37 C)   Resp 20   Ht 5\' 6"  (1.676 m)   Wt 107 lb (48.5 kg)   SpO2 97%   BMI 17.27 kg/m  Body mass index is 17.27 kg/m.  Gen: Afebrile. No acute distress.  No acute distress.  Very pleasant [place] female. HENT: AT. [place].MMM.  Eyes:Pupils Equal Round Reactive to light, Extraocular movements intact,  Conjunctiva without redness, discharge or icterus. MSK: No erythema.  Mild swelling remains.  Tender to palpation over proximal thumb joint.  Positive  Finkelstein.  Patient with mild discomfort.  Neurovascularly intact.  No exam data present No results found. No results found for this or any previous visit (from the past 24 hour(s)).  Assessment/Plan: Kelsey [name] is a 72 y.o. female present for OV for  Pain of left thumb/De Quervains tenosynovitis -Continues despite oral prednisone taper and capsaicin cream.   - Discussed options with her today and she is agreeable to x-ray of the area.   -  Will splint during waking hours for 2 weeks.  Splint provided today. -Start Mobic daily. -Follow-up is dependent upon x-ray results.  If not improved and x-ray is normal will offer sports med referral.    Reviewed expectations re: course of current medical issues.  Discussed self-management of symptoms.  Outlined signs and symptoms indicating need for more acute intervention.  Patient verbalized understanding and all questions were answered.  Patient received an After-Visit Summary.    No orders of the defined types were placed in this encounter.    Note is dictated utilizing voice recognition software. Although note has been proof read prior to signing, occasional typographical errors still can be missed. If any questions arise, please do not hesitate to call for verification.   electronically signed by:  Howard Pouch, DO  [place]

## 2018-01-16 ENCOUNTER — Encounter: Payer: Self-pay | Admitting: Family Medicine

## 2018-01-16 ENCOUNTER — Ambulatory Visit
Admission: RE | Admit: 2018-01-16 | Discharge: 2018-01-16 | Disposition: A | Discharge status: Home / Self Care | Payer: Medicare Other | Source: Ambulatory Visit | Attending: Family Medicine | Admitting: Family Medicine

## 2018-01-16 DIAGNOSIS — M24172 Other articular cartilage disorders, left ankle: Secondary | ICD-10-CM | POA: Diagnosis not present

## 2018-01-16 DIAGNOSIS — G8929 Other chronic pain: Secondary | ICD-10-CM

## 2018-01-16 DIAGNOSIS — M79645 Pain in left finger(s): Principal | ICD-10-CM

## 2018-02-24 NOTE — Progress Notes (Deleted)
[name] [address], [place] [address] Phone: [phone] Subjective:    I'm seeing this patient by the request  of:    CC:   [id number]  Kelsey [name] is a 72 y.o. female coming in with complaint of ***  Onset-  Location Duration-  Character- Aggravating factors- Reliving factors-  Therapies tried-  Severity-     Past Medical History:  Diagnosis Date  . Allergy   . Anemia    iron deficiency  . 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 Bladder prolapse, female, acquired   . Chicken pox as a child  . Dysuria 09/13/2011  . Female bladder prolapse 09/13/2011  . Fibrocystic breast 09/14/2011  . GERD (gastroesophageal reflux disease)   . H/O measles   . H/O mumps   . Hyperlipidemia   . 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 Osteoarthrosis    right shoulder  . Osteoporosis    reclast in Oct  . Ovarian cyst 09/14/2011  . [facility] spotted fever) 2017  . Uterine fibroid 11/2012   See Dr. Cletis Media note, surgery 11/2012  . Vaginitis 09/13/2011   Past Surgical History:  Procedure Laterality Date  . DILATATION & CURRETTAGE/HYSTEROSCOPY WITH RESECTOCOPE N/A 11/06/2012   Procedure: DILATATION & CURETTAGE/HYSTEROSCOPY WITH RESECTOCOPE;  Surgeon: Alwyn Pea, MD;  Location: [facility];  Service: Gynecology;  Laterality: N/A;  . HERNIA REPAIR  1992  . small intestine hernia  1994  . WISDOM TOOTH EXTRACTION     Social History   Socioeconomic History  . Marital status: Married    Spouse name: Not on file  . Number of children: Not on file  . Years of education: Not on file  . Highest education level: Not on file  Occupational History  . Not on file  Social Needs  . Financial resource strain: Not on file  . Food insecurity:    Worry: Not on file    Inability: Not on file  . Transportation needs:    Medical: Not on file    Non-medical: Not on file  Tobacco Use  . Smoking status: Former Smoker    Packs/day: 0.30    Years: 10.00   Pack years: 3.00    Types: Cigarettes    Last attempt to quit: 07/09/1978    Years since quitting: 39.6  . Smokeless tobacco: Never Used  Substance and Sexual Activity  . Alcohol use: Yes    Comment: 1-2 glasses of wine daily and a 12 oz beer daily  . Drug use: No  . Sexual activity: Never  Lifestyle  . Physical activity:    Days per week: Not on file    Minutes per session: Not on file  . Stress: Not on file  Relationships  . Social connections:    Talks on phone: Not on file    Gets together: Not on file    Attends religious service: Not on file    Active member of club or organization: Not on file    Attends meetings of clubs or organizations: Not on file    Relationship status: Not on file  Other Topics Concern  . Not on file  Social History Narrative  . Not on file   Allergies  Allergen Reactions  . Penicillins Rash    Has patient had a PCN reaction causing immediate rash, facial/tongue/throat swelling, SOB or lightheadedness with hypotension: no Has patient had a PCN reaction causing severe rash involving mucus membranes or skin necrosis:  yes Has patient had a PCN reaction that required hospitalization: no Has patient had a PCN reaction occurring within the last 10 years: no If all of the above answers are "NO", then may proceed with Cephalosporin use.   . Sulfa Antibiotics Rash    Under arm and thighs   Family History  Problem Relation Age of Onset  . Heart disease Mother   . Osteoporosis Mother   . Cancer Father        prostate  . Osteoporosis Sister   . Gout Brother   . Gout Maternal Grandmother   . Heart disease Maternal Grandmother   . Alzheimer's disease Maternal Grandfather   . Osteoporosis Sister      Past medical history, social, surgical and family history all reviewed in electronic medical record.  No pertanent information unless stated regarding to the chief complaint.   Review of Systems:Review of systems updated and as accurate as of  02/24/18  No headache, visual changes, nausea, vomiting, diarrhea, constipation, dizziness, abdominal pain, skin rash, fevers, chills, night sweats, weight loss, swollen lymph nodes, body aches, joint swelling, muscle aches, chest pain, shortness of breath, mood changes.   Objective  There were no vitals taken for this visit. Systems examined below as of 02/24/18   General: No apparent distress alert and oriented x3 mood and affect normal, dressed appropriately.  HEENT: Pupils equal, extraocular movements intact  Respiratory: Patient's speak in full sentences and does not appear short of breath  Cardiovascular: No lower extremity edema, non tender, no erythema  Skin: Warm dry intact with no signs of infection or rash on extremities or on axial skeleton.  Abdomen: Soft nontender  Neuro: Cranial nerves II through XII are intact, neurovascularly intact in all extremities with 2+ DTRs and 2+ pulses.  Lymph: No lymphadenopathy of posterior or anterior cervical chain or axillae bilaterally.  Gait normal with good balance and coordination.  MSK:  Non tender with full range of motion and good stability and symmetric strength and tone of shoulders, elbows, wrist, hip, knee and ankles bilaterally.     Impression and Recommendations:     This case required medical decision making of moderate complexity.      Note: This dictation was prepared with Dragon dictation along with smaller phrase technology. Any transcriptional errors that result from this process are unintentional.

## 2018-02-25 ENCOUNTER — Ambulatory Visit: Payer: Medicare Other | Admitting: Family Medicine

## 2018-03-04 DIAGNOSIS — H02834 Dermatochalasis of left upper eyelid: Secondary | ICD-10-CM | POA: Diagnosis not present

## 2018-03-04 DIAGNOSIS — H02835 Dermatochalasis of left lower eyelid: Secondary | ICD-10-CM | POA: Diagnosis not present

## 2018-03-04 DIAGNOSIS — H5213 Myopia, bilateral: Secondary | ICD-10-CM | POA: Diagnosis not present

## 2018-03-04 DIAGNOSIS — H02831 Dermatochalasis of right upper eyelid: Secondary | ICD-10-CM | POA: Diagnosis not present

## 2018-03-04 DIAGNOSIS — H52223 Regular astigmatism, bilateral: Secondary | ICD-10-CM | POA: Diagnosis not present

## 2018-03-04 DIAGNOSIS — H02832 Dermatochalasis of right lower eyelid: Secondary | ICD-10-CM | POA: Diagnosis not present

## 2018-03-04 DIAGNOSIS — H524 Presbyopia: Secondary | ICD-10-CM | POA: Diagnosis not present

## 2018-03-05 ENCOUNTER — Ambulatory Visit (INDEPENDENT_AMBULATORY_CARE_PROVIDER_SITE_OTHER): Payer: Medicare Other | Admitting: Family Medicine

## 2018-03-05 ENCOUNTER — Encounter: Payer: Self-pay | Admitting: Family Medicine

## 2018-03-05 VITALS — BP 90/61 | HR 72 | Temp 98.3°F | Resp 20 | Ht 66.0 in | Wt 105.6 lb

## 2018-03-05 DIAGNOSIS — Z79890 Hormone replacement therapy (postmenopausal): Secondary | ICD-10-CM

## 2018-03-05 DIAGNOSIS — E785 Hyperlipidemia, unspecified: Secondary | ICD-10-CM | POA: Diagnosis not present

## 2018-03-05 DIAGNOSIS — Z131 Encounter for screening for diabetes mellitus: Secondary | ICD-10-CM | POA: Diagnosis not present

## 2018-03-05 DIAGNOSIS — D649 Anemia, unspecified: Secondary | ICD-10-CM | POA: Diagnosis not present

## 2018-03-05 DIAGNOSIS — M810 Age-related osteoporosis without current pathological fracture: Secondary | ICD-10-CM | POA: Diagnosis not present

## 2018-03-05 DIAGNOSIS — K219 Gastro-esophageal reflux disease without esophagitis: Secondary | ICD-10-CM

## 2018-03-05 DIAGNOSIS — N952 Postmenopausal atrophic vaginitis: Secondary | ICD-10-CM

## 2018-03-05 DIAGNOSIS — S161XXA Strain of muscle, fascia and tendon at neck level, initial encounter: Secondary | ICD-10-CM

## 2018-03-05 DIAGNOSIS — E559 Vitamin D deficiency, unspecified: Secondary | ICD-10-CM | POA: Diagnosis not present

## 2018-03-05 LAB — COMPREHENSIVE METABOLIC PANEL
ALT: 16 U/L (ref 0–35)
AST: 24 U/L (ref 0–37)
Albumin: 4.6 g/dL (ref 3.5–5.2)
Alkaline Phosphatase: 64 U/L (ref 39–117)
BUN: 14 mg/dL (ref 6–23)
CALCIUM: 9 mg/dL (ref 8.4–10.5)
CO2: 29 meq/L (ref 19–32)
Chloride: 104 mEq/L (ref 96–112)
Creatinine, Ser: 0.65 mg/dL (ref 0.40–1.20)
GFR: 95.14 mL/min (ref >60.00)
Glucose, Bld: 81 mg/dL (ref 70–99)
Potassium: 4.7 mEq/L (ref 3.5–5.1)
Sodium: 140 mEq/L (ref 135–145)
Total Bilirubin: 0.6 mg/dL (ref 0.2–1.2)
Total Protein: 7.2 g/dL (ref 6.0–8.3)

## 2018-03-05 LAB — TSH: TSH: 1.62 u[IU]/mL (ref 0.35–4.50)

## 2018-03-05 LAB — LIPID PANEL
CHOLESTEROL: 243 mg/dL — AB (ref 0–200)
HDL: 98.7 mg/dL (ref >39.00)
LDL CALC: 133 mg/dL — AB (ref 0–99)
NonHDL: 144.14
TRIGLYCERIDES: 58 mg/dL (ref 0.0–149.0)
Total CHOL/HDL Ratio: 2
VLDL: 11.6 mg/dL (ref 0.0–40.0)

## 2018-03-05 LAB — CBC
HCT: 39.4 % (ref 36.0–46.0)
Hemoglobin: 13 g/dL (ref 12.0–15.0)
MCHC: 33.1 g/dL (ref 30.0–36.0)
MCV: 95.1 fl (ref 78.0–100.0)
PLATELETS: 269 10*3/uL (ref 150.0–400.0)
RBC: 4.14 Mil/uL (ref 3.87–5.11)
RDW: 13.9 % (ref 11.5–15.5)
WBC: 4.3 10*3/uL (ref 4.0–10.5)

## 2018-03-05 MED ORDER — BACLOFEN 10 MG PO TABS: 10.0000 mg | ORAL_TABLET | Freq: Two times a day (BID) | ORAL | 0 refills | Status: DC

## 2018-03-05 MED ORDER — VITAMIN D (ERGOCALCIFEROL) 1.25 MG (50000 UNIT) PO CAPS: 50000.0000 [IU] | ORAL_CAPSULE | ORAL | 0 refills | Status: DC

## 2018-03-05 MED ORDER — MELOXICAM 15 MG PO TABS: 15.0000 mg | ORAL_TABLET | Freq: Every day | ORAL | 0 refills | Status: DC

## 2018-03-05 MED ORDER — ZOSTER VAC RECOMB ADJUVANTED 50 MCG/0.5ML IM SUSR: 0.5000 mL | Freq: Once | INTRAMUSCULAR | 1 refills | Status: DC

## 2018-03-05 NOTE — Progress Notes (Signed)
Kelsey [name] , Feb 12, 1946, 72 y.o., female MRN: [id number] Patient Care Team    Relationship Specialty Notifications Start End  [name], DO PCP - General Family Medicine  05/27/15   [name], MD Consulting Physician Gastroenterology  12/08/15   Calvert Cantor, MD Consulting Physician Ophthalmology  12/08/15     Chief Complaint  Patient presents with  . Hyperlipidemia     Subjective: Pt presents for an OV follow up chronic medical conditions.  Gastroesophageal reflux disease without esophagitis Stable.   Osteoporosis without current pathological fracture, unspecified osteoporosis type/Vitamin D deficiency Compliant with prolia. Supplementing with calcium and vit d. dexa 12/05/2017 reviewed. -2.7 right femoral neck. Rpt 2 years.   Atrophic vaginitis/Hormone replacement therapy Continue estring 2 mg vaginal ring.  Pelvic UTD 11/2017.   Dyslipidemia/Anemia, unspecified type Very active, healthy eater, asymptomatic anemia in the past. Taking fish oil.   Neck strain, initial encounter Reports after her yoga class Monday her neck started bothering her. She does not recall an injury. She as attended same class weekly for decades. She has not taken anything for discomfort. She states the pain is worse with trying to turn her head right.    Depression screen [facility] 2/9 11/06/2017 12/17/2016 12/07/2016 12/08/2015 12/02/2013  Decreased Interest 0 0 0 0 0  Down, Depressed, Hopeless 0 0 0 0 1  PHQ - 2 Score 0 0 0 0 1    Allergies  Allergen Reactions  . Penicillins Rash    Has patient had a PCN reaction causing immediate rash, facial/tongue/throat swelling, SOB or lightheadedness with hypotension: no Has patient had a PCN reaction causing severe rash involving mucus membranes or skin necrosis: yes Has patient had a PCN reaction that required hospitalization: no Has patient had a PCN reaction occurring within the last 10 years: no If all of the above answers are "NO", then may  proceed with Cephalosporin use.   . Sulfa Antibiotics Rash    Under arm and thighs   Social History   Tobacco Use  . Smoking status: Former Smoker    Packs/day: 0.30    Years: 10.00    Pack years: 3.00    Types: Cigarettes    Last attempt to quit: 07/09/1978    Years since quitting: 39.6  . Smokeless tobacco: Never Used  Substance Use Topics  . Alcohol use: Yes    Comment: 1-2 glasses of wine daily and a 12 oz beer daily   Past Medical History:  Diagnosis Date  . Allergy   . Anemia    iron deficiency  . 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 Bladder prolapse, female, acquired   . Chicken pox as a child  . Dysuria 09/13/2011  . Female bladder prolapse 09/13/2011  . Fibrocystic breast 09/14/2011  . GERD (gastroesophageal reflux disease)   . H/O measles   . H/O mumps   . Hyperlipidemia   . 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 Osteoarthrosis    right shoulder  . Osteoporosis    reclast in Oct  . Ovarian cyst 09/14/2011  . [facility] spotted fever) 2017  . Uterine fibroid 11/2012   See Dr. Cletis Media note, surgery 11/2012  . Vaginitis 09/13/2011   Past Surgical History:  Procedure Laterality Date  . DILATATION & CURRETTAGE/HYSTEROSCOPY WITH RESECTOCOPE N/A 11/06/2012   Procedure: DILATATION & CURETTAGE/HYSTEROSCOPY WITH RESECTOCOPE;  Surgeon: Alwyn Pea, MD;  Location: [facility];  Service: Gynecology;  Laterality: N/A;  . HERNIA REPAIR  1992  . small  intestine hernia  1994  . WISDOM TOOTH EXTRACTION     Family History  Problem Relation Age of Onset  . Heart disease Mother   . Osteoporosis Mother   . Cancer Father        prostate  . Osteoporosis Sister   . Gout Brother   . Gout Maternal Grandmother   . Heart disease Maternal Grandmother   . Alzheimer's disease Maternal Grandfather   . Osteoporosis Sister    Allergies as of 03/05/2018      Reactions   Penicillins Rash   Has patient had a PCN reaction causing immediate rash, facial/tongue/throat swelling, SOB or lightheadedness  with hypotension: no Has patient had a PCN reaction causing severe rash involving mucus membranes or skin necrosis: yes Has patient had a PCN reaction that required hospitalization: no Has patient had a PCN reaction occurring within the last 10 years: no If all of the above answers are "NO", then may proceed with Cephalosporin use.   Sulfa Antibiotics Rash   Under arm and thighs      Medication List        Accurate as of 03/05/18  3:17 PM. Always use your most recent med list.          baclofen 10 MG tablet Commonly known as:  LIORESAL Take 1 tablet (10 mg total) by mouth 2 (two) times daily.   Biotin 10 MG Tabs Take 1 tablet by mouth daily.   CALCIUM + D3 PO Take 600 mg by mouth daily.   ESTRING 2 MG vaginal ring Generic drug:  estradiol Place 2 mg vaginally every 3 (three) months.   Fish Oil 1000 MG Caps Take 1 capsule by mouth daily.   Glucosamine 750 MG Tabs Take 1,200 mg by mouth daily.   MAGNESIA PO Take 1 tablet by mouth daily. OTC   meloxicam 15 MG tablet Commonly known as:  MOBIC Take 1 tablet (15 mg total) by mouth daily.   omeprazole 20 MG capsule Commonly known as:  PRILOSEC Take 1 capsule (20 mg total) by mouth daily.   Vitamin D-3 1000 units Caps Take 5,000 Units by mouth.   Zoster Vaccine Adjuvanted injection Commonly known as:  SHINGRIX Inject 0.5 mLs into the muscle once for 1 dose.       All past medical history, surgical history, allergies, family history, immunizations andmedications were updated in the EMR today and reviewed under the history and medication portions of their EMR.     ROS: Negative, with the exception of above mentioned in HPI   Objective:  BP 90/61 (BP Location: Left Arm, Patient Position: Sitting, Cuff Size: Small)   Pulse 72   Temp 98.3 F (36.8 C)   Resp 20   Ht _0  (1.676 m)   Wt 105 lb 9.6 oz (47.9 kg)   SpO2 97%   BMI 17.04 kg/m  Body mass index is 17.04 kg/m. Gen: Afebrile. No acute distress.  Nontoxic in appearance, well developed, well nourished. Pleasant asian female.  HENT: AT. [place]. Bilateral TM visualized without erythema or fullness. MMM, no oral lesions. Bilateral nares without erythema. Throat without erythema or exudates. No cough or hoarseness Eyes:Pupils Equal Round Reactive to light, Extraocular movements intact,  Conjunctiva without redness, discharge or icterus. Neck/lymp/endocrine: Supple,no lymphadenopathy CV: RRR no murmur, no edema Chest: CTAB, no wheeze or crackles. Good air movement, normal resp effort.  Abd: Soft. flat. NTND. BS present. no Masses palpated. No rebound or guarding.  MSK: (neck) No erythema,  no soft tissue swelling. No cervical bone tenderness. TTP left SCM and bilateral trap. Decreased ROM 2/2 to discomfort flexion, right SB and bilateral rotation. NV intact distally.  Skin: no rashes, purpura or petechiae.  Neuro:  Normal gait. PERLA. EOMi. Alert. Oriented x3  Psych: Normal affect, dress and demeanor. Normal speech. Normal thought content and judgment.  No exam data present No results found. No results found for this or any previous visit (from the past 24 hour(s)).  Assessment/Plan: SHALUNDA LINDH is a 72 y.o. female present for OV for  Gastroesophageal reflux disease without esophagitis Stable. Continue prilosec as needed daily.   Osteoporosis without current pathological fracture, unspecified osteoporosis type/Vitamin D deficiency - on prolia. Continue calcium and vit d - dexa 12/05/2017 reviewed. -2.7 right femoral neck. Rpt 2 years.   Atrophic vaginitis/Hormone replacement therapy Continue estring 2 mg vaginal ring.  Pelvic UTD 11/2017.   Dyslipidemia lipid panel, TSH, cmp  Anemia, unspecified type CBC collected today.   Neck strain, initial encounter New. Ropy and tender trap and Left SCM. Rest, heat, massage, mobic, baclofen prescribed. Instructions provided on baclofen use.  - f/u 4 weeks if not resolved, sooner if  worsening.   Reviewed expectations re: course of current medical issues.  Discussed self-management of symptoms.  Outlined signs and symptoms indicating need for more acute intervention.  Patient verbalized understanding and all questions were answered.  Patient received an After-Visit Summary.    Orders Placed This Encounter  Procedures  . CBC  . Comp Met (CMET)  . TSH  . Lipid panel    > 25 minutes spent with patient, >50% of time spent face to face   Note is dictated utilizing voice recognition software. Although note has been proof read prior to signing, occasional typographical errors still can be missed. If any questions arise, please do not hesitate to call for verification.   electronically signed by:  Howard Pouch, DO  [place]

## 2018-03-05 NOTE — Patient Instructions (Addendum)
Call in next week to see if we Have the high dose flu available.  We will call you  With lab results.  Restart the meloxicam (called in refills also)  Start the baclofen up to every 12 hours, definitely before bed.  Use heating pad as we discussed and consider scheduling yourself a massage.  If neck improving in 2 weeks, follow up Follow up with me yearly for chronic conditions Yearly medicare wellness   Cervical Sprain A cervical sprain is a stretch or tear in the tissues that connect bones (ligaments) in the neck. Most neck (cervical) sprains get better in 4-6 weeks. Follow these instructions at home: If you have a neck collar:  Wear it as told by your doctor. Do not take off (do not remove) the collar unless your doctor says that this is safe.  Ask your doctor before adjusting your collar.  If you have long hair, keep it outside of the collar.  Ask your doctor if you may take off the collar for cleaning and bathing. If you may take off the collar: ? Follow instructions from your doctor about how to take off the collar safely. ? Clean the collar by wiping it with mild soap and water. Let it air-dry all the way. ? If your collar has removable pads:  Take the pads out every 1-2 days.  Hand wash the pads with soap and water.  Let the pads air-dry all the way before you put them back in the collar. Do not dry them in a clothes dryer. Do not dry them with a hair dryer. ? Check your skin under the collar for irritation or sores. If you see any, tell your doctor. Managing pain, stiffness, and swelling  Use a cervical traction device, if told by your doctor.  If told, put heat on the affected area. Do this before exercises (physical therapy) or as often as told by your doctor. Use the heat source that your doctor recommends, such as a moist heat pack or a heating pad. ? Place a towel between your skin and the heat source. ? Leave the heat on for 20-30 minutes. ? Take the heat off  (remove the heat) if your skin turns bright red. This is very important if you cannot feel pain, heat, or cold. You may have a greater risk of getting burned.  Put ice on the affected area. ? Put ice in a plastic bag. ? Place a towel between your skin and the bag. ? Leave the ice on for 20 minutes, 2-3 times a day. Activity  Do not drive while wearing a neck collar. If you do not have a neck collar, ask your doctor if it is safe to drive.  Do not drive or use heavy machinery while taking prescription pain medicine or muscle relaxants, unless your doctor approves.  Do not lift anything that is heavier than 10 lb (4.5 kg) until your doctor tells you that it is safe.  Rest as told by your doctor.  Avoid activities that make you feel worse. Ask your doctor what activities are safe for you.  Do exercises as told by your doctor or physical therapist. Preventing neck sprain  Practice good posture. Adjust your workstation to help with this, if needed.  Exercise regularly as told by your doctor or physical therapist.  Avoid activities that are risky or may cause a neck sprain (cervical sprain). General instructions  Take over-the-counter and prescription medicines only as told by your doctor.  Do  not use any products that contain nicotine or tobacco. This includes cigarettes and e-cigarettes. If you need help quitting, ask your doctor.  Keep all follow-up visits as told by your doctor. This is important. Contact a doctor if:  You have pain or other symptoms that get worse.  You have symptoms that do not get better after 2 weeks.  You have pain that does not get better with medicine.  You start to have new, unexplained symptoms.  You have sores or irritated skin from wearing your neck collar. Get help right away if:  You have very bad pain.  You have any of the following in any part of your body: ? Loss of feeling (numbness). ? Tingling. ? Weakness.  You cannot move a part  of your body (you have paralysis).  Your activity level does not improve. Summary  A cervical sprain is a stretch or tear in the tissues that connect bones (ligaments) in the neck.  If you have a neck (cervical) collar, do not take off the collar unless your doctor says that this is safe.  Put ice on affected areas as told by your doctor.  Put heat on affected areas as told by your doctor.  Good posture and regular exercise can help prevent a neck sprain from happening again. This information is not intended to replace advice given to you by your health care provider. Make sure you discuss any questions you have with your health care provider. Document Released: 12/12/2007 Document Revised: 03/06/2016 Document Reviewed: 03/06/2016 Elsevier Interactive Patient Education  2017 [place].   Please help [place] help you:  We are honored you have chosen [facility] for your Primary Care home. Below you will find basic instructions that you may need to access in the future. Please help [place] help you by reading the instructions, which cover many of the frequent questions we experience.   Prescription refills and request:  -In order to allow more efficient response time, please call your pharmacy for all refills. They will forward the request electronically to [place]. This allows for the quickest possible response. Request left on a nurse line can take longer to refill, since these are checked as time allows between office patients and other phone calls.  - refill request can take up to 3-5 working days to complete.  - If request is sent electronically and request is appropiate, it is usually completed in 1-2 business days.  - all patients will need to be seen routinely for all chronic medical conditions requiring prescription medications (see follow-up below). If you are overdue for follow up on your condition, you will be asked to make an appointment and we will call in enough medication to cover  you until your appointment (up to 30 days).  - all controlled substances will require a face to face visit to request/refill.  - if you desire your prescriptions to go through a new pharmacy, and have an active script at original pharmacy, you will need to call your pharmacy and have scripts transferred to new pharmacy. This is completed between the pharmacy locations and not by your provider.    Results: If any images or labs were ordered, it can take up to 1 week to get results depending on the test ordered and the lab/facility running and resulting the test. - Normal or stable results, which do not need further discussion, may be released to your mychart immediately with attached note to you. A call may not be generated  for normal results. Please make certain to sign up for mychart. If you have questions on how to activate your mychart you can call the front office.  - If your results need further discussion, our office will attempt to contact you via phone, and if unable to reach you after 2 attempts, we will release your abnormal result to your mychart with instructions.  - All results will be automatically released in mychart after 1 week.  - Your provider will provide you with explanation and instruction on all relevant material in your results. Please keep in mind, results and labs may appear confusing or abnormal to the untrained eye, but it does not mean they are actually abnormal for you personally. If you have any questions about your results that are not covered, or you desire more detailed explanation than what was provided, you should make an appointment with your provider to do so.   Our office handles many outgoing and incoming calls daily. If we have not contacted you within 1 week about your results, please check your mychart to see if there is a message first and if not, then contact our office.  In helping with this matter, you help decrease call volume, and therefore allow [place] to be  able to respond to patients needs more efficiently.   Acute office visits (sick visit):  An acute visit is intended for a new problem and are scheduled in shorter time slots to allow schedule openings for patients with new problems. This is the appropriate visit to discuss a new problem. Problems will not be addressed by phone call or Echart message. Appointment is needed if requesting treatment. In order to provide you with excellent quality medical care with proper time for you to explain your problem, have an exam and receive treatment with instructions, these appointments should be limited to one new problem per visit. If you experience a new problem, in which you desire to be addressed, please make an acute office visit, we save openings on the schedule to accommodate you. Please do not save your new problem for any other type of visit, let us take care of it properly and quickly for you.   Follow up visits:  Depending on your condition(s) your provider will need to see you routinely in order to provide you with quality care and prescribe medication(s). Most chronic conditions (Example: hypertension, Diabetes, depression/anxiety... etc), require visits a couple times a year. Your provider will instruct you on proper follow up for your personal medical conditions and history. Please make certain to make follow up appointments for your condition as instructed. Failing to do so could result in lapse in your medication treatment/refills. If you request a refill, and are overdue to be seen on a condition, we will always provide you with a 30 day script (once) to allow you time to schedule.    Medicare wellness (well visit): - we have a wonderful Nurse Maudie Mercury), that will meet with you and provide you will yearly medicare wellness visits. These visits should occur yearly (can not be scheduled less than 1 calendar year apart) and cover preventive health, immunizations, advance directives and screenings you are  entitled to yearly through your medicare benefits. Do not miss out on your entitled benefits, this is when medicare will pay for these benefits to be ordered for you.  These are strongly encouraged by your provider and is the appropriate type of visit to make certain you are up to date with all preventive health  benefits. If you have not had your medicare wellness exam in the last 12 months, please make certain to schedule one by calling the office and schedule your medicare wellness with Maudie Mercury as soon as possible.   Yearly physical (well visit):  - Adults are recommended to be seen yearly for physicals. Check with your insurance and date of your last physical, most insurances require one calendar year between physicals. Physicals include all preventive health topics, screenings, medical exam and labs that are appropriate for gender/age and history. You may have fasting labs needed at this visit. This is a well visit (not a sick visit), new problems should not be covered during this visit (see acute visit).  - Pediatric patients are seen more frequently when they are younger. Your provider will advise you on well child visit timing that is appropriate for your their age. - This is not a medicare wellness visit. Medicare wellness exams do not have an exam portion to the visit. Some medicare companies allow for a physical, some do not allow a yearly physical. If your medicare allows a yearly physical you can schedule the medicare wellness with our nurse Maudie Mercury and have your physical with your provider after, on the same day. Please check with insurance for your full benefits.   Late Policy/No Shows:  - all new patients should arrive 15-30 minutes earlier than appointment to allow [place] time  to  obtain all personal demographics,  insurance information and for you to complete office paperwork. - All established patients should arrive 10-15 minutes earlier than appointment time to update all information and be checked  in .  - In our best efforts to run on time, if you are late for your appointment you will be asked to either reschedule or if able, we will work you back into the schedule. There will be a wait time to work you back in the schedule,  depending on availability.  - If you are unable to make it to your appointment as scheduled, please call 24 hours ahead of time to allow [place] to fill the time slot with someone else who needs to be seen. If you do not cancel your appointment ahead of time, you may be charged a no show fee.

## 2018-03-13 ENCOUNTER — Encounter: Payer: Self-pay | Admitting: Family Medicine

## 2018-03-13 ENCOUNTER — Ambulatory Visit: Payer: Self-pay

## 2018-03-13 ENCOUNTER — Ambulatory Visit (INDEPENDENT_AMBULATORY_CARE_PROVIDER_SITE_OTHER): Payer: Medicare Other | Admitting: Family Medicine

## 2018-03-13 VITALS — BP 100/60 | HR 72 | Ht 66.0 in | Wt 106.0 lb

## 2018-03-13 DIAGNOSIS — G8929 Other chronic pain: Secondary | ICD-10-CM

## 2018-03-13 DIAGNOSIS — M533 Sacrococcygeal disorders, not elsewhere classified: Secondary | ICD-10-CM | POA: Diagnosis not present

## 2018-03-13 DIAGNOSIS — M19049 Primary osteoarthritis, unspecified hand: Secondary | ICD-10-CM | POA: Diagnosis not present

## 2018-03-13 DIAGNOSIS — M79645 Pain in left finger(s): Secondary | ICD-10-CM

## 2018-03-13 DIAGNOSIS — M9905 Segmental and somatic dysfunction of pelvic region: Secondary | ICD-10-CM

## 2018-03-13 DIAGNOSIS — M999 Biomechanical lesion, unspecified: Secondary | ICD-10-CM | POA: Diagnosis not present

## 2018-03-13 NOTE — Progress Notes (Signed)
[name] [place], [place] [address] Phone: [phone] Subjective:       I [name] am serving as a Education administrator for Dr. [name].   CC: Neck and back pain  LKT:[id number]  Kelsey [name] is a 72 y.o. female coming in with complaint of back and neck pain. Left thumb pain. Wants to talk about MRI from December. No injury to the thumb. Loss of ROM in thumb.  Patient is back has had significant tightness.  Has had an L4 and L5 nerve root impingement.  Seen on MRI.  This was independently visualized by me.  Patient had not been following up with me know secondary to the illness of her husband.  Character-aching Aggravating factors- ADL  Reliving factors- Oral meds  Therapies tried-  Severity-7 out of 10     Past Medical History:  Diagnosis Date  . Allergy   . Anemia    iron deficiency  . 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 Bladder prolapse, female, acquired   . Chicken pox as a child  . Dysuria 09/13/2011  . Female bladder prolapse 09/13/2011  . Fibrocystic breast 09/14/2011  . GERD (gastroesophageal reflux disease)   . H/O measles   . H/O mumps   . Hyperlipidemia   . 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 Osteoarthrosis    right shoulder  . Osteoporosis    reclast in Oct  . Ovarian cyst 09/14/2011  . 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 Uterine fibroid 11/2012   See Dr. Cletis Media note, surgery 11/2012  . Vaginitis 09/13/2011   Past Surgical History:  Procedure Laterality Date  . DILATATION & CURRETTAGE/HYSTEROSCOPY WITH RESECTOCOPE N/A 11/06/2012   Procedure: DILATATION & CURETTAGE/HYSTEROSCOPY WITH RESECTOCOPE;  Surgeon: Alwyn Pea, MD;  Location: [facility];  Service: Gynecology;  Laterality: N/A;  . HERNIA REPAIR  1992  . small intestine hernia  1994  . WISDOM TOOTH EXTRACTION     Social History   Socioeconomic History  . Marital status: Married    Spouse name: Not on file  . Number of children: Not on file  . Years of  education: Not on file  . Highest education level: Not on file  Occupational History  . Not on file  Social Needs  . Financial resource strain: Not on file  . Food insecurity:    Worry: Not on file    Inability: Not on file  . Transportation needs:    Medical: Not on file    Non-medical: Not on file  Tobacco Use  . Smoking status: Former Smoker    Packs/day: 0.30    Years: 10.00    Pack years: 3.00    Types: Cigarettes    Last attempt to quit: 07/09/1978    Years since quitting: 39.7  . Smokeless tobacco: Never Used  Substance and Sexual Activity  . Alcohol use: Yes    Comment: 1-2 glasses of wine daily and a 12 oz beer daily  . Drug use: No  . Sexual activity: Never  Lifestyle  . Physical activity:    Days per week: Not on file    Minutes per session: Not on file  . Stress: Not on file  Relationships  . Social connections:    Talks on phone: Not on file    Gets together: Not on file    Attends religious service: Not on file    Active member of club or organization: Not on file  Attends meetings of clubs or organizations: Not on file    Relationship status: Not on file  Other Topics Concern  . Not on file  Social History Narrative  . Not on file   Allergies  Allergen Reactions  . Penicillins Rash    Has patient had a PCN reaction causing immediate rash, facial/tongue/throat swelling, SOB or lightheadedness with hypotension: no Has patient had a PCN reaction causing severe rash involving mucus membranes or skin necrosis: yes Has patient had a PCN reaction that required hospitalization: no Has patient had a PCN reaction occurring within the last 10 years: no If all of the above answers are "NO", then may proceed with Cephalosporin use.   . Sulfa Antibiotics Rash    Under arm and thighs   Family History  Problem Relation Age of Onset  . Heart disease Mother   . Osteoporosis Mother   . Cancer Father        prostate  . Osteoporosis Sister   . Gout Brother     . Gout Maternal Grandmother   . Heart disease Maternal Grandmother   . Alzheimer's disease Maternal Grandfather   . Osteoporosis Sister        Current Outpatient Medications (Analgesics):  .  meloxicam (MOBIC) 15 MG tablet, Take 1 tablet (15 mg total) by mouth daily.   Current Outpatient Medications (Other):  .  baclofen (LIORESAL) 10 MG tablet, Take 1 tablet (10 mg total) by mouth 2 (two) times daily. .  Biotin 10 MG TABS, Take 1 tablet by mouth daily.  .  Calcium Carb-Cholecalciferol (CALCIUM + D3 PO), Take 600 mg by mouth daily.  .  Cholecalciferol (VITAMIN D-3) 1000 units CAPS, Take 5,000 Units by mouth.  .  ESTRING 2 MG vaginal ring, Place 2 mg vaginally every 3 (three) months. .  Glucosamine 750 MG TABS, Take 1,200 mg by mouth daily.  .  Magnesium Hydroxide (MAGNESIA PO), Take 1 tablet by mouth daily. OTC .  Omega-3 Fatty Acids (FISH OIL) 1000 MG CAPS, Take 1 capsule by mouth daily. Marland Kitchen  omeprazole (PRILOSEC) 20 MG capsule, Take 1 capsule (20 mg total) by mouth daily.    Past medical history, social, surgical and family history all reviewed in electronic medical record.  No pertanent information unless stated regarding to the chief complaint.   Review of Systems:  No headache, visual changes, nausea, vomiting, diarrhea, constipation, dizziness, abdominal pain, skin rash, fevers, chills, night sweats, weight loss, swollen lymph nodes,  joint swelling,chest pain, shortness of breath, mood changes.  Positive muscle aches, body aches  Objective  Blood pressure 100/60, pulse 72, height 5\' 6"  (1.676 m), weight 106 lb (48.1 kg), SpO2 96 %.   General: No apparent distress alert and oriented x3 mood and affect normal, dressed appropriately.  HEENT: Pupils equal, extraocular movements intact  Respiratory: Patient's speak in full sentences and does not appear short of breath  Cardiovascular: No lower extremity edema, non tender, no erythema  Skin: Warm dry intact with no signs of  infection or rash on extremities or on axial skeleton.  Abdomen: Soft nontender  Neuro: Cranial nerves II through XII are intact, neurovascularly intact in all extremities with 2+ DTRs and 2+ pulses.  Lymph: No lymphadenopathy of posterior or anterior cervical chain or axillae bilaterally.  Gait mild antalgic MSK:  tender with full range of motion and good stability and symmetric strength and tone of shoulders, elbows, wrist, hip, knee and ankles bilaterally.  Moderate arthritic changes of multiple joints  Patient back exam shows significant loss of lordosis and some degenerative scoliosis.  Patient does have tenderness to palpation in the paraspinal musculature lumbar spine.  Does have decreased range of motion in all planes by 5 to 10 degrees.  Neurovascularly intact distally.  Mild positive Corky Sox on the left side. Negative straight leg test but significant tightness of the hamstring.  Osteopathic findings C4 flexed rotated and side bent left T3 extended rotated and side bent right inhaled third rib T9 extended rotated and side bent left L2 flexed rotated and side bent right Sacrum right on right Right anterior ilium   Patient limited musculoskeletal ultrasound was performed and interpreted by Lyndal Pulley  Limited ultrasound presents with mild arthritic changes with a mild synovitis but all very unremarkable.   Impression and Recommendations:     This case required medical decision making of moderate complexity. The above documentation has been reviewed and is accurate and complete Lyndal Pulley, DO       Note: This dictation was prepared with Dragon dictation along with smaller phrase technology. Any transcriptional errors that result from this process are unintentional.

## 2018-03-13 NOTE — Assessment & Plan Note (Signed)
Discussed icing regimen and home exercise.  Discussed which activities to do which wants to avoid.  Patient does have significant arthritic changes and does have nerve root impingement.  May need to consider epidurals.  Patient will continue with conservative therapy.  Do feel that 6-week intervals for manipulation would be beneficial.

## 2018-03-13 NOTE — Assessment & Plan Note (Signed)
Discussed bracing at night, topical anti-inflammatories.

## 2018-03-13 NOTE — Patient Instructions (Signed)
Good to see you  Ice is your friend pennsaid pinkie amount topically 2 times daily as needed on the thumb Try the bacolfen at night Melxoicam daily for a week then as needed Anytime you start the meloxicam take it daily for a week Stay active See me again in 5 weeks

## 2018-03-13 NOTE — Assessment & Plan Note (Signed)
Decision today to treat with OMT was based on Physical Exam  After verbal consent patient was treated with HVLA, ME, FPR techniques in cervical, thoracic, pelvis lumbar and sacral areas  Patient tolerated the procedure well with improvement in symptoms  Patient given exercises, stretches and lifestyle modifications  See medications in patient instructions if given  Patient will follow up in 4 weeks

## 2018-04-07 ENCOUNTER — Ambulatory Visit (INDEPENDENT_AMBULATORY_CARE_PROVIDER_SITE_OTHER): Payer: Medicare Other

## 2018-04-07 ENCOUNTER — Other Ambulatory Visit: Payer: Self-pay

## 2018-04-07 DIAGNOSIS — Z0000 Encounter for general adult medical examination without abnormal findings: Secondary | ICD-10-CM

## 2018-04-07 DIAGNOSIS — Z23 Encounter for immunization: Secondary | ICD-10-CM | POA: Diagnosis not present

## 2018-04-07 NOTE — Progress Notes (Addendum)
Subjective:   Kelsey [name] is a 72 y.o. female who presents for Medicare Annual (Subsequent) preventive examination.  Review of Systems:  No ROS.  Medicare Wellness Visit. Additional risk factors are reflected in the social history.  Cardiac Risk Factors include: advanced age (>42men, >68 women);dyslipidemia;family history of premature cardiovascular disease   Sleep patterns: Sleeps 7 hours.  Home Safety/Smoke Alarms: Feels safe in home. Smoke alarms in place.  Living environment; residence and Firearm Safety: Lives with husband in 1 story with basement, rail at steps.  Seat Belt Safety/Bike Helmet: Wears seat belt.   Female:   Pap-N/A      Mammo-12/05/2017, benign.        Dexa scan-12/05/2017, Osteoporosis.         CCS-Colonoscopy 01/06/2014, normal. Recall 10 years.      Objective:     Vitals: BP (!) 92/58 (BP Location: Left Arm, Patient Position: Sitting, Cuff Size: Normal)   Pulse 82   Ht 5\' 6"  (1.676 m)   Wt 106 lb 2 oz (48.1 kg)   SpO2 96%   BMI 17.13 kg/m   Body mass index is 17.13 kg/m.  Advanced Directives 04/07/2018 12/18/2016 12/17/2016 08/05/2016 11/04/2012  Does Patient Have a Medical Advance Directive? Yes Yes Yes No Patient has advance directive, copy not in chart  Type of Advance Directive [place];Living will [place];Living will [place];Living will - [place] in Chart? Yes Yes No - copy requested - -    Tobacco Social History   Tobacco Use  Smoking Status Former Smoker  . Packs/day: 0.30  . Years: 10.00  . Pack years: 3.00  . Types: Cigarettes  . Last attempt to quit: 07/09/1978  . Years since quitting: 39.7  Smokeless Tobacco Never Used     Counseling given: Not Answered   Past Medical History:  Diagnosis Date  . Allergy   . Anemia    iron deficiency  . 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 Bladder prolapse, female, acquired    . Chicken pox as a child  . Dysuria 09/13/2011  . Female bladder prolapse 09/13/2011  . Fibrocystic breast 09/14/2011  . GERD (gastroesophageal reflux disease)   . H/O measles   . H/O mumps   . Hyperlipidemia   . 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 Osteoarthrosis    right shoulder  . Osteoporosis    reclast in Oct  . Ovarian cyst 09/14/2011  . RMSF [facility] spotted fever) 2017  . Uterine fibroid 11/2012   See Dr. Cletis Media note, surgery 11/2012  . Vaginitis 09/13/2011   Past Surgical History:  Procedure Laterality Date  . DILATATION & CURRETTAGE/HYSTEROSCOPY WITH RESECTOCOPE N/A 11/06/2012   Procedure: DILATATION & CURETTAGE/HYSTEROSCOPY WITH RESECTOCOPE;  Surgeon: Alwyn Pea, MD;  Location: [facility];  Service: Gynecology;  Laterality: N/A;  . HERNIA REPAIR  1992  . small intestine hernia  1994  . WISDOM TOOTH EXTRACTION     Family History  Problem Relation Age of Onset  . Heart disease Mother   . Osteoporosis Mother   . Cancer Father        prostate  . Osteoporosis Sister   . Gout Brother   . Gout Maternal Grandmother   . Heart disease Maternal Grandmother   . Alzheimer's disease Maternal Grandfather   . Osteoporosis Sister    Social History   Socioeconomic History  . Marital status: Married  Spouse name: Not on file  . Number of children: Not on file  . Years of education: Not on file  . Highest education level: Not on file  Occupational History  . Not on file  Social Needs  . Financial resource strain: Not on file  . Food insecurity:    Worry: Not on file    Inability: Not on file  . Transportation needs:    Medical: Not on file    Non-medical: Not on file  Tobacco Use  . Smoking status: Former Smoker    Packs/day: 0.30    Years: 10.00    Pack years: 3.00    Types: Cigarettes    Last attempt to quit: 07/09/1978    Years since quitting: 39.7  . Smokeless tobacco: Never Used  Substance and Sexual Activity  . Alcohol use: Yes    Comment: 1-2 glasses of wine  daily and a 12 oz beer daily  . Drug use: No  . Sexual activity: Never  Lifestyle  . Physical activity:    Days per week: Not on file    Minutes per session: Not on file  . Stress: Not on file  Relationships  . Social connections:    Talks on phone: Not on file    Gets together: Not on file    Attends religious service: Not on file    Active member of club or organization: Not on file    Attends meetings of clubs or organizations: Not on file    Relationship status: Not on file  Other Topics Concern  . Not on file  Social History Narrative  . Not on file    Outpatient Encounter Medications as of 04/07/2018  Medication Sig  . Biotin 10 MG TABS Take 1 tablet by mouth daily.   . Calcium Carb-Cholecalciferol (CALCIUM + D3 PO) Take 600 mg by mouth daily.   . Cholecalciferol (VITAMIN D-3) 1000 units CAPS Take 5,000 Units by mouth.   . denosumab (PROLIA) 60 MG/ML SOSY injection Inject 60 mg into the skin every 6 (six) months.  Konrad Saha 2 MG vaginal ring Place 2 mg vaginally every 3 (three) months.  . Glucosamine 750 MG TABS Take 1,200 mg by mouth daily.   . Magnesium Hydroxide (MAGNESIA PO) Take 1 tablet by mouth daily. OTC  . meloxicam (MOBIC) 15 MG tablet Take 1 tablet (15 mg total) by mouth daily.  . Omega-3 Fatty Acids (FISH OIL) 1000 MG CAPS Take 1 capsule by mouth daily.  Marland Kitchen omeprazole (PRILOSEC) 20 MG capsule Take 1 capsule (20 mg total) by mouth daily.  . baclofen (LIORESAL) 10 MG tablet Take 1 tablet (10 mg total) by mouth 2 (two) times daily. (Patient not taking: Reported on 04/07/2018)   No facility-administered encounter medications on file as of 04/07/2018.     Activities of Daily Living In your present state of health, do you have any difficulty performing the following activities: 04/07/2018  Hearing? N  Vision? N  Difficulty concentrating or making decisions? N  Walking or climbing stairs? N  Dressing or bathing? N  Doing errands, shopping? N  Preparing Food and  eating ? N  Using the Toilet? N  In the past six months, have you accidently leaked urine? N  Do you have problems with loss of bowel control? N  Managing your Medications? N  Managing your Finances? N  Housekeeping or managing your Housekeeping? N  Some recent data might be hidden    Patient Care Team: [place],  Reinaldo Raddle, DO as PCP - General (Family Medicine) [name], MD as Consulting Physician (Gastroenterology) Calvert Cantor, MD as Consulting Physician (Ophthalmology) Lyndal Pulley, DO as Consulting Physician (Family Medicine)    Assessment:   This is a routine wellness examination for Kelsey [name].  Exercise Activities and Dietary recommendations Current Exercise Habits: Structured exercise class, Type of exercise: yoga;Other - see comments(pilates, swimming), Frequency (Times/Week): 4, Exercise limited by: None identified   Diet (meal preparation, eat out, water intake, caffeinated beverages, dairy products, fruits and vegetables): Drinks water and juice (beet). Smoothie (kale and beet).   Eats 3 meals/day. Variety of protein and vegetables.   Goals      Patient Stated   . <enter goal here> (pt-stated)     Increase coordination, balance and alignment.       Other   . Gain weight     Gain weight by increasing protein        Fall Risk Fall Risk  04/07/2018 03/05/2018 11/06/2017 12/17/2016 12/07/2016  Falls in the past year? No No No No No  Comment - - - - -  Number falls in past yr: - - - - -  Injury with Fall? - - - - -  Comment - - - - -  Risk for fall due to : - - - - -  Risk for fall due to: Comment - - - - -    Depression Screen PHQ 2/9 Scores 04/07/2018 03/05/2018 11/06/2017 12/17/2016  PHQ - 2 Score 0 0 0 0     Cognitive Function MMSE - Mini Mental State Exam 04/07/2018  Orientation to time 5  Orientation to Place 5  Registration 3  Attention/ Calculation 5  Recall 3  Language- name 2 objects 2  Language- repeat 1  Language- follow 3 step command 3    Language- read & follow direction 1  Write a sentence 1  Copy design 1  Total score 30        Immunization History  Administered Date(s) Administered  . Hepatitis A, Adult 03/23/2014  . Influenza Whole 04/09/2011  . Influenza, High Dose Seasonal PF 05/27/2015, 04/12/2016, 04/02/2017, 04/07/2018  . Influenza,inj,Quad PF,6+ Mos 03/19/2014  . Pneumococcal Conjugate-13 12/02/2013, 04/02/2014  . Pneumococcal Polysaccharide-23 12/08/2015  . Tdap 12/02/2013    Screening Tests Health Maintenance  Topic Date Due  . INFLUENZA VACCINE  05/04/2018 (Originally 02/06/2018)  . MAMMOGRAM  12/06/2018  . DEXA SCAN  12/06/2019  . TETANUS/TDAP  12/03/2023  . COLONOSCOPY  01/07/2024  . Hepatitis C Screening  Completed  . PNA vac Low Risk Adult  Completed        Plan:     Shingles vaccine  Continue doing brain stimulating activities (puzzles, reading, adult coloring books, staying active) to keep memory sharp.   I have personally reviewed and noted the following in the patient's chart:   . Medical and social history . Use of alcohol, tobacco or illicit drugs  . Current medications and supplements . Functional ability and status . Nutritional status . Physical activity . Advanced directives . List of other physicians . Hospitalizations, surgeries, and ER visits in previous 12 months . Vitals . Screenings to include cognitive, depression, and falls . Referrals and appointments  In addition, I have reviewed and discussed with patient certain preventive protocols, quality metrics, and best practice recommendations. A written personalized care plan for preventive services as well as general preventive health recommendations were provided to patient.     Royetta Crochet  Zachery Conch, RN  04/07/2018  PCP Notes: -Pt to call for F/U, no issues.   Medical screening examination/treatment/procedure(s) were performed by non-physician practitioner and as supervising physician.   I agree with above  assessment and plan.  Electronically Signed by: Howard Pouch, DO [facility] [place]

## 2018-04-07 NOTE — Patient Instructions (Addendum)
Shingles vaccine  Continue doing brain stimulating activities (puzzles, reading, adult coloring books, staying active) to keep memory sharp.   Health Maintenance, Female Adopting a healthy lifestyle and getting preventive care can go a long way to promote health and wellness. Talk with your health care provider about what schedule of regular examinations is right for you. This is a good chance for you to check in with your provider about disease prevention and staying healthy. In between checkups, there are plenty of things you can do on your own. Experts have done a lot of research about which lifestyle changes and preventive measures are most likely to keep you healthy. Ask your health care provider for more information. Weight and diet Eat a healthy diet  Be sure to include plenty of vegetables, fruits, low-fat dairy products, and lean protein.  Do not eat a lot of foods high in solid fats, added sugars, or salt.  Get regular exercise. This is one of the most important things you can do for your health. ? Most adults should exercise for at least 150 minutes each week. The exercise should increase your heart rate and make you sweat (moderate-intensity exercise). ? Most adults should also do strengthening exercises at least twice a week. This is in addition to the moderate-intensity exercise.  Maintain a healthy weight  Body mass index (BMI) is a measurement that can be used to identify possible weight problems. It estimates body fat based on height and weight. Your health care provider can help determine your BMI and help you achieve or maintain a healthy weight.  For females 46 years of age and older: ? A BMI below 18.5 is considered underweight. ? A BMI of 18.5 to 24.9 is normal. ? A BMI of 25 to 29.9 is considered overweight. ? A BMI of 30 and above is considered obese.  Watch levels of cholesterol and blood lipids  You should start having your blood tested for lipids and  cholesterol at 72 years of age, then have this test every 5 years.  You may need to have your cholesterol levels checked more often if: ? Your lipid or cholesterol levels are high. ? You are older than 72 years of age. ? You are at high risk for heart disease.  Cancer screening Lung Cancer  Lung cancer screening is recommended for adults 71-46 years old who are at high risk for lung cancer because of a history of smoking.  A yearly low-dose CT scan of the lungs is recommended for people who: ? Currently smoke. ? Have quit within the past 15 years. ? Have at least a 30-pack-year history of smoking. A pack year is smoking an average of one pack of cigarettes a day for 1 year.  Yearly screening should continue until it has been 15 years since you quit.  Yearly screening should stop if you develop a health problem that would prevent you from having lung cancer treatment.  Breast Cancer  Practice breast self-awareness. This means understanding how your breasts normally appear and feel.  It also means doing regular breast self-exams. Let your health care provider know about any changes, no matter how small.  If you are in your 20s or 30s, you should have a clinical breast exam (CBE) by a health care provider every 1-3 years as part of a regular health exam.  If you are 31 or older, have a CBE every year. Also consider having a breast X-ray (mammogram) every year.  If you have a  family history of breast cancer, talk to your health care provider about genetic screening.  If you are at high risk for breast cancer, talk to your health care provider about having an MRI and a mammogram every year.  Breast cancer gene (BRCA) assessment is recommended for women who have family members with BRCA-related cancers. BRCA-related cancers include: ? Breast. ? Ovarian. ? Tubal. ? Peritoneal cancers.  Results of the assessment will determine the need for genetic counseling and BRCA1 and BRCA2  testing.  Cervical Cancer Your health care provider may recommend that you be screened regularly for cancer of the pelvic organs (ovaries, uterus, and vagina). This screening involves a pelvic examination, including checking for microscopic changes to the surface of your cervix (Pap test). You may be encouraged to have this screening done every 3 years, beginning at age 51.  For women ages 53-65, health care providers may recommend pelvic exams and Pap testing every 3 years, or they may recommend the Pap and pelvic exam, combined with testing for human papilloma virus (HPV), every 5 years. Some types of HPV increase your risk of cervical cancer. Testing for HPV may also be done on women of any age with unclear Pap test results.  Other health care providers may not recommend any screening for nonpregnant women who are considered low risk for pelvic cancer and who do not have symptoms. Ask your health care provider if a screening pelvic exam is right for you.  If you have had past treatment for cervical cancer or a condition that could lead to cancer, you need Pap tests and screening for cancer for at least 20 years after your treatment. If Pap tests have been discontinued, your risk factors (such as having a new sexual partner) need to be reassessed to determine if screening should resume. Some women have medical problems that increase the chance of getting cervical cancer. In these cases, your health care provider may recommend more frequent screening and Pap tests.  Colorectal Cancer  This type of cancer can be detected and often prevented.  Routine colorectal cancer screening usually begins at 72 years of age and continues through 72 years of age.  Your health care provider may recommend screening at an earlier age if you have risk factors for colon cancer.  Your health care provider may also recommend using home test kits to check for hidden blood in the stool.  A small camera at the end of a  tube can be used to examine your colon directly (sigmoidoscopy or colonoscopy). This is done to check for the earliest forms of colorectal cancer.  Routine screening usually begins at age 80.  Direct examination of the colon should be repeated every 5-10 years through 72 years of age. However, you may need to be screened more often if early forms of precancerous polyps or small growths are found.  Skin Cancer  Check your skin from head to toe regularly.  Tell your health care provider about any new moles or changes in moles, especially if there is a change in a mole's shape or color.  Also tell your health care provider if you have a mole that is larger than the size of a pencil eraser.  Always use sunscreen. Apply sunscreen liberally and repeatedly throughout the day.  Protect yourself by wearing long sleeves, pants, a wide-brimmed hat, and sunglasses whenever you are outside.  Heart disease, diabetes, and high blood pressure  High blood pressure causes heart disease and increases the risk of  stroke. High blood pressure is more likely to develop in: ? People who have blood pressure in the high end of the normal range (130-139/85-89 mm Hg). ? People who are overweight or obese. ? People who are African American.  If you are 2-9 years of age, have your blood pressure checked every 3-5 years. If you are 23 years of age or older, have your blood pressure checked every year. You should have your blood pressure measured twice-once when you are at a hospital or clinic, and once when you are not at a hospital or clinic. Record the average of the two measurements. To check your blood pressure when you are not at a hospital or clinic, you can use: ? An automated blood pressure machine at a pharmacy. ? A home blood pressure monitor.  If you are between 28 years and 73 years old, ask your health care provider if you should take aspirin to prevent strokes.  Have regular diabetes screenings. This  involves taking a blood sample to check your fasting blood sugar level. ? If you are at a normal weight and have a low risk for diabetes, have this test once every three years after 72 years of age. ? If you are overweight and have a high risk for diabetes, consider being tested at a younger age or more often. Preventing infection Hepatitis B  If you have a higher risk for hepatitis B, you should be screened for this virus. You are considered at high risk for hepatitis B if: ? You were born in a country where hepatitis B is common. Ask your health care provider which countries are considered high risk. ? Your parents were born in a high-risk country, and you have not been immunized against hepatitis B (hepatitis B vaccine). ? You have HIV or AIDS. ? You use needles to inject street drugs. ? You live with someone who has hepatitis B. ? You have had sex with someone who has hepatitis B. ? You get hemodialysis treatment. ? You take certain medicines for conditions, including cancer, organ transplantation, and autoimmune conditions.  Hepatitis C  Blood testing is recommended for: ? Everyone born from [birth year] through 1965. ? Anyone with known risk factors for hepatitis C.  Sexually transmitted infections (STIs)  You should be screened for sexually transmitted infections (STIs) including gonorrhea and chlamydia if: ? You are sexually active and are younger than 72 years of age. ? You are older than 72 years of age and your health care provider tells you that you are at risk for this type of infection. ? Your sexual activity has changed since you were last screened and you are at an increased risk for chlamydia or gonorrhea. Ask your health care provider if you are at risk.  If you do not have HIV, but are at risk, it may be recommended that you take a prescription medicine daily to prevent HIV infection. This is called pre-exposure prophylaxis (PrEP). You are considered at risk if: ? You are  sexually active and do not regularly use condoms or know the HIV status of your partner(s). ? You take drugs by injection. ? You are sexually active with a partner who has HIV.  Talk with your health care provider about whether you are at high risk of being infected with HIV. If you choose to begin PrEP, you should first be tested for HIV. You should then be tested every 3 months for as long as you are taking PrEP. Pregnancy  If  you are premenopausal and you may become pregnant, ask your health care provider about preconception counseling.  If you may become pregnant, take 400 to 800 micrograms (mcg) of folic acid every day.  If you want to prevent pregnancy, talk to your health care provider about birth control (contraception). Osteoporosis and menopause  Osteoporosis is a disease in which the bones lose minerals and strength with aging. This can result in serious bone fractures. Your risk for osteoporosis can be identified using a bone density scan.  If you are 29 years of age or older, or if you are at risk for osteoporosis and fractures, ask your health care provider if you should be screened.  Ask your health care provider whether you should take a calcium or vitamin D supplement to lower your risk for osteoporosis.  Menopause may have certain physical symptoms and risks.  Hormone replacement therapy may reduce some of these symptoms and risks. Talk to your health care provider about whether hormone replacement therapy is right for you. Follow these instructions at home:  Schedule regular health, dental, and eye exams.  Stay current with your immunizations.  Do not use any tobacco products including cigarettes, chewing tobacco, or electronic cigarettes.  If you are pregnant, do not drink alcohol.  If you are breastfeeding, limit how much and how often you drink alcohol.  Limit alcohol intake to no more than 1 drink per day for nonpregnant women. One drink equals 12 ounces of  beer, 5 ounces of wine, or 1 ounces of hard liquor.  Do not use street drugs.  Do not share needles.  Ask your health care provider for help if you need support or information about quitting drugs.  Tell your health care provider if you often feel depressed.  Tell your health care provider if you have ever been abused or do not feel safe at home. This information is not intended to replace advice given to you by your health care provider. Make sure you discuss any questions you have with your health care provider. Document Released: 01/08/2011 Document Revised: 12/01/2015 Document Reviewed: 03/29/2015 Elsevier Interactive Patient Education  [employer].

## 2018-04-21 NOTE — Progress Notes (Signed)
[name] [address], [place] [address] Phone: [phone] Subjective:     CC: Low back pain  HQP:[id number]  Kelsey [name] is a 72 y.o. female coming in with complaint of low back pain.  Has known spinal stenosis.  Has had pinched right nerve.  Patient has been avoiding the epidurals.  Responding fairly well to manipulation.  Last exam was having worsening discomfort and pain and muscle tightness.  Patient did not want to change medications so.  Given couple injections that were helpful.  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     Past Medical History:  Diagnosis Date  . Allergy   . Anemia    iron deficiency  . 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 Bladder prolapse, female, acquired   . Chicken pox as a child  . Dysuria 09/13/2011  . Female bladder prolapse 09/13/2011  . Fibrocystic breast 09/14/2011  . GERD (gastroesophageal reflux disease)   . H/O measles   . H/O mumps   . Hyperlipidemia   . 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 Osteoarthrosis    right shoulder  . Osteoporosis    reclast in Oct  . Ovarian cyst 09/14/2011  . 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 Uterine fibroid 11/2012   See Dr. Cletis Media note, surgery 11/2012  . Vaginitis 09/13/2011   Past Surgical History:  Procedure Laterality Date  . DILATATION & CURRETTAGE/HYSTEROSCOPY WITH RESECTOCOPE N/A 11/06/2012   Procedure: DILATATION & CURETTAGE/HYSTEROSCOPY WITH RESECTOCOPE;  Surgeon: Alwyn Pea, MD;  Location: [facility];  Service: Gynecology;  Laterality: N/A;  . HERNIA REPAIR  1992  . small intestine hernia  1994  . WISDOM TOOTH EXTRACTION     Social History   Socioeconomic History  . Marital status: Married    Spouse name: Not on file  . Number of children: Not on file  . Years of education: Not on file  . Highest education level: Not on file  Occupational History  . Not on file  Social Needs  .  Financial resource strain: Not on file  . Food insecurity:    Worry: Not on file    Inability: Not on file  . Transportation needs:    Medical: Not on file    Non-medical: Not on file  Tobacco Use  . Smoking status: Former Smoker    Packs/day: 0.30    Years: 10.00    Pack years: 3.00    Types: Cigarettes    Last attempt to quit: 07/09/1978    Years since quitting: 39.8  . Smokeless tobacco: Never Used  Substance and Sexual Activity  . Alcohol use: Yes    Comment: 1-2 glasses of wine daily and a 12 oz beer daily  . Drug use: No  . Sexual activity: Never  Lifestyle  . Physical activity:    Days per week: Not on file    Minutes per session: Not on file  . Stress: Not on file  Relationships  . Social connections:    Talks on phone: Not on file    Gets together: Not on file    Attends religious service: Not on file    Active member of club or organization: Not on file    Attends meetings of clubs or organizations: Not on file    Relationship status: Not on file  Other Topics Concern  . Not on file  Social History Narrative  .  Not on file   Allergies  Allergen Reactions  . Penicillins Rash    Has patient had a PCN reaction causing immediate rash, facial/tongue/throat swelling, SOB or lightheadedness with hypotension: no Has patient had a PCN reaction causing severe rash involving mucus membranes or skin necrosis: yes Has patient had a PCN reaction that required hospitalization: no Has patient had a PCN reaction occurring within the last 10 years: no If all of the above answers are "NO", then may proceed with Cephalosporin use.   . Sulfa Antibiotics Rash    Under arm and thighs   Family History  Problem Relation Age of Onset  . Heart disease Mother   . Osteoporosis Mother   . Cancer Father        prostate  . Osteoporosis Sister   . Gout Brother   . Gout Maternal Grandmother   . Heart disease Maternal Grandmother   . Alzheimer's disease Maternal Grandfather   .  Osteoporosis Sister     Current Outpatient Medications (Endocrine & Metabolic):  .  denosumab (PROLIA) 60 MG/ML SOSY injection, Inject 60 mg into the skin every 6 (six) months.    Current Outpatient Medications (Analgesics):  .  meloxicam (MOBIC) 15 MG tablet, Take 1 tablet (15 mg total) by mouth daily.   Current Outpatient Medications (Other):  .  baclofen (LIORESAL) 10 MG tablet, Take 1 tablet (10 mg total) by mouth 2 (two) times daily. .  Biotin 10 MG TABS, Take 1 tablet by mouth daily.  .  Calcium Carb-Cholecalciferol (CALCIUM + D3 PO), Take 600 mg by mouth daily.  .  Cholecalciferol (VITAMIN D-3) 1000 units CAPS, Take 5,000 Units by mouth.  .  ESTRING 2 MG vaginal ring, Place 2 mg vaginally every 3 (three) months. .  Glucosamine 750 MG TABS, Take 1,200 mg by mouth daily.  .  Magnesium Hydroxide (MAGNESIA PO), Take 1 tablet by mouth daily. OTC .  Omega-3 Fatty Acids (FISH OIL) 1000 MG CAPS, Take 1 capsule by mouth daily. Marland Kitchen  omeprazole (PRILOSEC) 20 MG capsule, Take 1 capsule (20 mg total) by mouth daily.    Past medical history, social, surgical and family history all reviewed in electronic medical record.  No pertanent information unless stated regarding to the chief complaint.   Review of Systems:  No headache, visual changes, nausea, vomiting, diarrhea, constipation, dizziness, abdominal pain, skin rash, fevers, chills, night sweats, weight loss, swollen lymph nodes, body aches, joint swelling,  chest pain, shortness of breath, mood changes.  Positive muscle aches  Objective  Blood pressure 116/70, pulse 90, resp. rate 16, weight 107 lb (48.5 kg), SpO2 96 %.    General: No apparent distress alert and oriented x3 mood and affect normal, dressed appropriately.  HEENT: Pupils equal, extraocular movements intact  Respiratory: Patient's speak in full sentences and does not appear short of breath  Cardiovascular: No lower extremity edema, non tender, no erythema  Skin: Warm dry  intact with no signs of infection or rash on extremities or on axial skeleton.  Abdomen: Soft nontender  Neuro: Cranial nerves II through XII are intact, neurovascularly intact in all extremities with 2+ DTRs and 2+ pulses.  Lymph: No lymphadenopathy of posterior or anterior cervical chain or axillae bilaterally.  Gait n antalgic MSK:  Non tender with full range of motion and good stability and symmetric strength and tone of shoulders, elbows, wrist, hip, knee and ankles bilaterally.  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  Tender to palpation diffusely in the paraspinal musculature.  More over the sacroiliac joints.  Negative straight leg test.  Positive Faber test.  Decreased range of motion of the hips bilaterally  Osteopathic findings T11 extended rotated and side bent left L3 flexed rotated and side bent right Sacrum right on right     Impression and Recommendations:     This case required medical decision making of moderate complexity. The above documentation has been reviewed and is accurate and complete Lyndal Pulley, DO       Note: This dictation was prepared with Dragon dictation along with smaller phrase technology. Any transcriptional errors that result from this process are unintentional.

## 2018-04-22 ENCOUNTER — Encounter: Payer: Self-pay | Admitting: Family Medicine

## 2018-04-22 ENCOUNTER — Ambulatory Visit (INDEPENDENT_AMBULATORY_CARE_PROVIDER_SITE_OTHER): Payer: Medicare Other | Admitting: Family Medicine

## 2018-04-22 VITALS — BP 116/70 | HR 90 | Resp 16 | Wt 107.0 lb

## 2018-04-22 DIAGNOSIS — M47818 Spondylosis without myelopathy or radiculopathy, sacral and sacrococcygeal region: Secondary | ICD-10-CM

## 2018-04-22 DIAGNOSIS — M999 Biomechanical lesion, unspecified: Secondary | ICD-10-CM

## 2018-04-22 NOTE — Assessment & Plan Note (Signed)
Moderate to severe.  Responding fairly well to manipulation.  Discussed posture and ergonomics again.  Discussed diet changes.  Patient does have a low BMI.  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  I believe that this is causing patient to have more discomfort as well.  Follow-up with me again 4 to 8 weeks

## 2018-04-22 NOTE — Assessment & Plan Note (Signed)
Decision today to treat with OMT was based on Physical Exam  After verbal consent patient was treated with HVLA, ME, FPR techniques in , thoracic, lumbar and sacral areas  Patient tolerated the procedure well with improvement in symptoms  Patient given exercises, stretches and lifestyle modifications  See medications in patient instructions if given  Patient will follow up in 4-8 weeks 

## 2018-04-22 NOTE — Patient Instructions (Signed)
Good to see you  Ice 20 minutes 2 times daily. Usually after activity and before bed. Exercises 3 times a week.  See me again in 5-6 weeks

## 2018-05-26 NOTE — Progress Notes (Signed)
[name] [place], [place] [address] Phone: [phone] Subjective:    I Kelsey [name] am serving as a Education administrator for Dr. [name].   CC: Hip pain  BJY:NWGNFAOZHY  Kelsey [name] is a 72 y.o. female coming in with complaint of hip pain. Hasn't had to take the medicine. Has shooting pain sometimes but not enought to take the medicine.  Patient has more hip and back pain.  Seems to be moderate to severe in nature sometimes.  Patient states had one day where she unfortunately had even difficulty with regular daily activities.  Patient denies of any fevers chills or any abnormal weight loss.  Patient is using some of the protein supplements and is noticing some improvement.     Past Medical History:  Diagnosis Date  . Allergy   . Anemia    iron deficiency  . 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 Bladder prolapse, female, acquired   . Chicken pox as a child  . Dysuria 09/13/2011  . Female bladder prolapse 09/13/2011  . Fibrocystic breast 09/14/2011  . GERD (gastroesophageal reflux disease)   . H/O measles   . H/O mumps   . Hyperlipidemia   . 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 Osteoarthrosis    right shoulder  . Osteoporosis    reclast in Oct  . Ovarian cyst 09/14/2011  . RMSF [facility] spotted fever) 2017  . Uterine fibroid 11/2012   See Dr. Cletis Media note, surgery 11/2012  . Vaginitis 09/13/2011   Past Surgical History:  Procedure Laterality Date  . DILATATION & CURRETTAGE/HYSTEROSCOPY WITH RESECTOCOPE N/A 11/06/2012   Procedure: DILATATION & CURETTAGE/HYSTEROSCOPY WITH RESECTOCOPE;  Surgeon: Alwyn Pea, MD;  Location: [facility];  Service: Gynecology;  Laterality: N/A;  . HERNIA REPAIR  1992  . small intestine hernia  1994  . WISDOM TOOTH EXTRACTION     Social History   Socioeconomic History  . Marital status: Married    Spouse name: Not on file  . Number of children: Not on file  . Years of education: Not on file  . Highest  education level: Not on file  Occupational History  . Not on file  Social Needs  . Financial resource strain: Not on file  . Food insecurity:    Worry: Not on file    Inability: Not on file  . Transportation needs:    Medical: Not on file    Non-medical: Not on file  Tobacco Use  . Smoking status: Former Smoker    Packs/day: 0.30    Years: 10.00    Pack years: 3.00    Types: Cigarettes    Last attempt to quit: 07/09/1978    Years since quitting: 39.9  . Smokeless tobacco: Never Used  Substance and Sexual Activity  . Alcohol use: Yes    Comment: 1-2 glasses of wine daily and a 12 oz beer daily  . Drug use: No  . Sexual activity: Never  Lifestyle  . Physical activity:    Days per week: Not on file    Minutes per session: Not on file  . Stress: Not on file  Relationships  . Social connections:    Talks on phone: Not on file    Gets together: Not on file    Attends religious service: Not on file    Active member of club or organization: Not on file    Attends meetings of clubs or organizations: Not on file    Relationship  status: Not on file  Other Topics Concern  . Not on file  Social History Narrative  . Not on file   Allergies  Allergen Reactions  . Penicillins Rash    Has patient had a PCN reaction causing immediate rash, facial/tongue/throat swelling, SOB or lightheadedness with hypotension: no Has patient had a PCN reaction causing severe rash involving mucus membranes or skin necrosis: yes Has patient had a PCN reaction that required hospitalization: no Has patient had a PCN reaction occurring within the last 10 years: no If all of the above answers are "NO", then may proceed with Cephalosporin use.   . Sulfa Antibiotics Rash    Under arm and thighs   Family History  Problem Relation Age of Onset  . Heart disease Mother   . Osteoporosis Mother   . Cancer Father        prostate  . Osteoporosis Sister   . Gout Brother   . Gout Maternal Grandmother   .  Heart disease Maternal Grandmother   . Alzheimer's disease Maternal Grandfather   . Osteoporosis Sister     Current Outpatient Medications (Endocrine & Metabolic):  .  denosumab (PROLIA) 60 MG/ML SOSY injection, Inject 60 mg into the skin every 6 (six) months.    Current Outpatient Medications (Analgesics):  .  meloxicam (MOBIC) 15 MG tablet, Take 1 tablet (15 mg total) by mouth daily.   Current Outpatient Medications (Other):  .  baclofen (LIORESAL) 10 MG tablet, Take 1 tablet (10 mg total) by mouth 2 (two) times daily. .  Biotin 10 MG TABS, Take 1 tablet by mouth daily.  .  Calcium Carb-Cholecalciferol (CALCIUM + D3 PO), Take 600 mg by mouth daily.  .  Cholecalciferol (VITAMIN D-3) 1000 units CAPS, Take 5,000 Units by mouth.  .  ESTRING 2 MG vaginal ring, Place 2 mg vaginally every 3 (three) months. .  Glucosamine 750 MG TABS, Take 1,200 mg by mouth daily.  .  Magnesium Hydroxide (MAGNESIA PO), Take 1 tablet by mouth daily. OTC .  Omega-3 Fatty Acids (FISH OIL) 1000 MG CAPS, Take 1 capsule by mouth daily. Marland Kitchen  omeprazole (PRILOSEC) 20 MG capsule, Take 1 capsule (20 mg total) by mouth daily.    Past medical history, social, surgical and family history all reviewed in electronic medical record.  No pertanent information unless stated regarding to the chief complaint.   Review of Systems:  No headache, visual changes, nausea, vomiting, diarrhea, constipation, dizziness, abdominal pain, skin rash, fevers, chills, night sweats, weight loss, swollen lymph nodes, body aches, joint swelling, chest pain, shortness of breath, mood changes.  Positive muscle aches  Objective  Blood pressure 100/76, pulse 81, height 5\' 6"  (1.676 m), weight 109 lb (49.4 kg), SpO2 96 %.    General: No apparent distress alert and oriented x3 mood and affect normal, dressed appropriately.  HEENT: Pupils equal, extraocular movements intact  Respiratory: Patient's speak in full sentences and does not appear short  of breath  Cardiovascular: No lower extremity edema, non tender, no erythema  Skin: Warm dry intact with no signs of infection or rash on extremities or on axial skeleton.  Abdomen: Soft nontender  Neuro: Cranial nerves II through XII are intact, neurovascularly intact in all extremities with 2+ DTRs and 2+ pulses.  Lymph: No lymphadenopathy of posterior or anterior cervical chain or axillae bilaterally.  Gait mild antalgic MSK:  tender with mild limited range of motion and good stability and symmetric strength and tone of shoulders, elbows,  wrist, hip, knee and ankles bilaterally.  Back exam has significant degenerative scoliosis noted.  Patient does have tightness of the [facility] test bilaterally.  Loss of 10 degrees in all planes with range of motion.  Mild crepitus noted.  4+ out of 5 strength of the lower extremities but symmetric   T4 extended rotated and side bent right L2 flexed rotated and side bent left Sacrum right on right Right pelvic shear noted   Impression and Recommendations:     This case required medical decision making of moderate complexity. The above documentation has been reviewed and is accurate and complete Lyndal Pulley, DO       Note: This dictation was prepared with Dragon dictation along with smaller phrase technology. Any transcriptional errors that result from this process are unintentional.

## 2018-05-27 ENCOUNTER — Encounter: Payer: Self-pay | Admitting: Family Medicine

## 2018-05-27 ENCOUNTER — Ambulatory Visit (INDEPENDENT_AMBULATORY_CARE_PROVIDER_SITE_OTHER): Payer: Medicare Other | Admitting: Family Medicine

## 2018-05-27 VITALS — BP 100/76 | HR 81 | Ht 66.0 in | Wt 109.0 lb

## 2018-05-27 DIAGNOSIS — M999 Biomechanical lesion, unspecified: Secondary | ICD-10-CM | POA: Diagnosis not present

## 2018-05-27 DIAGNOSIS — M9905 Segmental and somatic dysfunction of pelvic region: Secondary | ICD-10-CM

## 2018-05-27 DIAGNOSIS — M533 Sacrococcygeal disorders, not elsewhere classified: Secondary | ICD-10-CM | POA: Diagnosis not present

## 2018-05-27 NOTE — Assessment & Plan Note (Signed)
Decision today to treat with OMT was based on Physical Exam  After verbal consent patient was treated with HVLA, ME, FPR techniques in cervical, thoracic, lumbar and sacral and pelvis  areas  Patient tolerated the procedure well with improvement in symptoms  Patient given exercises, stretches and lifestyle modifications  See medications in patient instructions if given  Patient will follow up in 8 weeks

## 2018-05-27 NOTE — Patient Instructions (Signed)
Good to see ou  Ice is your friend Overall you are doing well  :Look up clean project and read about what foods are more important to buy organic  See me again in 2 months

## 2018-05-27 NOTE — Assessment & Plan Note (Signed)
Significant amount of arthritic changes.  Patient continues to train with conservative therapy.  Has decreased the amount of medications at this time.  Did believe that some of other comorbidities are also contributing.  Encourage patient to make sure she is taking care of herself as well.  Patient will follow-up with me again 8 weeks

## 2018-06-19 ENCOUNTER — Ambulatory Visit: Payer: Self-pay | Admitting: Family Medicine

## 2018-06-23 ENCOUNTER — Ambulatory Visit: Payer: Self-pay | Admitting: Family Medicine

## 2018-06-23 ENCOUNTER — Telehealth: Payer: Self-pay | Admitting: Family Medicine

## 2018-06-23 NOTE — Telephone Encounter (Signed)
Patient is still having vaginal itching. Patient did not realize we had scheduled her for today. Patient was driving, said she would call back to schedule appt.

## 2018-06-23 NOTE — Telephone Encounter (Signed)
noted 

## 2018-06-24 ENCOUNTER — Encounter: Payer: Self-pay | Admitting: Internal Medicine

## 2018-06-24 ENCOUNTER — Ambulatory Visit (INDEPENDENT_AMBULATORY_CARE_PROVIDER_SITE_OTHER): Payer: Medicare Other | Admitting: Internal Medicine

## 2018-06-24 ENCOUNTER — Other Ambulatory Visit (INDEPENDENT_AMBULATORY_CARE_PROVIDER_SITE_OTHER): Payer: Medicare Other

## 2018-06-24 VITALS — BP 90/60 | HR 75 | Temp 98.9°F | Ht 66.0 in | Wt 110.0 lb

## 2018-06-24 DIAGNOSIS — L299 Pruritus, unspecified: Secondary | ICD-10-CM

## 2018-06-24 DIAGNOSIS — N952 Postmenopausal atrophic vaginitis: Secondary | ICD-10-CM

## 2018-06-24 LAB — CBC
HCT: 39.7 % (ref 36.0–46.0)
Hemoglobin: 13.3 g/dL (ref 12.0–15.0)
MCHC: 33.4 g/dL (ref 30.0–36.0)
MCV: 95 fl (ref 78.0–100.0)
Platelets: 285 10*3/uL (ref 150.0–400.0)
RBC: 4.18 Mil/uL (ref 3.87–5.11)
RDW: 13.5 % (ref 11.5–15.5)
WBC: 4.4 10*3/uL (ref 4.0–10.5)

## 2018-06-24 LAB — COMPREHENSIVE METABOLIC PANEL
ALT: 17 U/L (ref 0–35)
AST: 22 U/L (ref 0–37)
Albumin: 4.5 g/dL (ref 3.5–5.2)
Alkaline Phosphatase: 58 U/L (ref 39–117)
BUN: 17 mg/dL (ref 6–23)
CO2: 29 mEq/L (ref 19–32)
Calcium: 9.5 mg/dL (ref 8.4–10.5)
Chloride: 102 mEq/L (ref 96–112)
Creatinine, Ser: 0.59 mg/dL (ref 0.40–1.20)
GFR: 106.3 mL/min (ref >60.00)
Glucose, Bld: 81 mg/dL (ref 70–99)
POTASSIUM: 4.3 meq/L (ref 3.5–5.1)
Sodium: 138 mEq/L (ref 135–145)
Total Bilirubin: 0.6 mg/dL (ref 0.2–1.2)
Total Protein: 7.4 g/dL (ref 6.0–8.3)

## 2018-06-24 MED ORDER — FLUCONAZOLE 150 MG PO TABS: 150.0000 mg | ORAL_TABLET | Freq: Once | ORAL | 0 refills | Status: AC

## 2018-06-24 NOTE — Assessment & Plan Note (Signed)
Checking CBC and CMP to check for cause of itching. Asked to use zyrtec daily to help and benadryl if needed. Asked to avoid scratching as this can promote histamines in the body which can promote the cycle of itching further.

## 2018-06-24 NOTE — Patient Instructions (Signed)
We have sent in diflucan to take today to help the discharge and itchiness.   We are checking the blood work today to make sure there is not a cause of the itching.   You can use zyrtec daily for 1-2 weeks to stop the itching or benadryl if needed for itching when it happens.

## 2018-06-24 NOTE — Assessment & Plan Note (Signed)
Likely this is related to this. Rx for diflucan to see if there is a yeast component. Advised to change her ring in case this is related. We talked about how some discharge can be physiologic.

## 2018-06-24 NOTE — Progress Notes (Signed)
   Subjective:    Patient ID: Kelsey [name], female    DOB: [birth year], 72 y.o.   MRN: [id number]  HPI The patient is a 72 YO female coming in for several concerns including vaginal discharge (some clear drainage, no burning with urination, some mild vaginal itching, present for several weeks, no worsening, denies new sexual partner, uses vaginal ring for several years now, is due to change this soon, ) and itching (changes locations, she gets very intense itching and she scratches a lot, then it moves later, lasts around 30 minutes maximum, has stopped some oral supplements recently, no new soaps, lotion, detergent, new clothes, leaves no rash)  Review of Systems  Constitutional: Negative.   HENT: Negative.   Eyes: Negative.   Respiratory: Negative for cough, chest tightness and shortness of breath.   Cardiovascular: Negative for chest pain, palpitations and leg swelling.  Gastrointestinal: Negative for abdominal distention, abdominal pain, constipation, diarrhea, nausea and vomiting.  Genitourinary: Positive for vaginal discharge. Negative for decreased urine volume, difficulty urinating, dyspareunia, dysuria, enuresis, flank pain, frequency, genital sores, hematuria, menstrual problem, pelvic pain, urgency, vaginal bleeding and vaginal pain.  Musculoskeletal: Negative.   Skin: Negative.        Pruritus  Neurological: Negative.   Psychiatric/Behavioral: Negative.       Objective:   Physical Exam Constitutional:      Appearance: She is well-developed.  HENT:     Head: Normocephalic and atraumatic.  Neck:     Musculoskeletal: Normal range of motion.  Cardiovascular:     Rate and Rhythm: Normal rate and regular rhythm.  Pulmonary:     Effort: Pulmonary effort is normal. No respiratory distress.     Breath sounds: Normal breath sounds. No wheezing or rales.  Abdominal:     General: Bowel sounds are normal. There is no distension.     Palpations: Abdomen is soft.   Tenderness: There is no abdominal tenderness. There is no rebound.  Skin:    General: Skin is warm and dry.     Comments: No rash or lesion on the skin in areas where she has had itching previously. No itching or scratching during exam.   Neurological:     Mental Status: She is alert and oriented to person, place, and time.     Coordination: Coordination normal.    Vitals:   06/24/18 1046  BP: 90/60  Pulse: 75  Temp: 98.9 F (37.2 C)  TempSrc: Oral  SpO2: 95%  Weight: 110 lb (49.9 kg)  Height: 5\' 6"  (1.676 m)      Assessment & Plan:

## 2018-06-27 ENCOUNTER — Telehealth: Payer: Self-pay | Admitting: Internal Medicine

## 2018-06-27 NOTE — Telephone Encounter (Signed)
Patient is checking to see if her results have come back from her 06/24/18 blood draw. Please call her cell. Thanks

## 2018-06-27 NOTE — Telephone Encounter (Signed)
Please see result note 

## 2018-06-27 NOTE — Telephone Encounter (Signed)
Pt agree that Dr. [name] is out of the office till Monday, okay to wait until Monday for Dr. [name] to review the labs from Dr. Sharlet Salina.

## 2018-06-30 NOTE — Telephone Encounter (Signed)
Please send to ordering physician- they will review with her.

## 2018-06-30 NOTE — Telephone Encounter (Signed)
For quality patient care please see the previous note done 3 days ago by myself. The results have already been reviewed at least 4 days ago and the result note is logged in the chart. Please call patient and you can read the result note to them. This is also available on her mychart since she is signed up for this with the same note.

## 2018-06-30 NOTE — Telephone Encounter (Signed)
Will ask Dr. [name] to review labs from last visit w Dr. Sharlet Salina. Please review labs.

## 2018-06-30 NOTE — Telephone Encounter (Signed)
Dr. Sharlet Salina can you please advise pt, she has called our office and we told her to contact your office for the results since she was seen at your office.  Thanks

## 2018-07-01 NOTE — Telephone Encounter (Signed)
Patient was seen by Dr. Sharlet Salina on 06/24/2018 for pruritus. Labs were ordered at that visit which are reported as normal. It does not seem the patient is aware of those results.  Please call the pt to inform her of those normal results- since she did not receive them from the ordering office. I believe it was sent via echart and Mrs.Thronburg, although "active" does not use echart.  Thank you.

## 2018-07-01 NOTE — Telephone Encounter (Signed)
Pt informed and results. Voiced understanding.

## 2018-07-21 NOTE — Progress Notes (Signed)
[name] [place], [place] [address] Phone: [phone] Subjective:   [name], am serving as a scribe for Dr. [name].   CC: Back pain follow-up  BJY:NWGNFAOZHY  Kelsey [name] is a 73 y.o. female coming in with complaint of back and neck pain. She said that she was stressed over the holidays due to family being at her house. Is having right shoulder pain into the thoracic spine. Also having right hip pain. Patient was having pain over left lumbar spine.    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  Past Medical History:  Diagnosis Date  . Allergy   . Anemia    iron deficiency  . 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 Bladder prolapse, female, acquired   . Chicken pox as a child  . Dysuria 09/13/2011  . Female bladder prolapse 09/13/2011  . Fibrocystic breast 09/14/2011  . GERD (gastroesophageal reflux disease)   . H/O measles   . H/O mumps   . Hyperlipidemia   . 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 Osteoarthrosis    right shoulder  . Osteoporosis    reclast in Oct  . Ovarian cyst 09/14/2011  . RMSF [facility] spotted fever) 2017  . Uterine fibroid 11/2012   See Dr. Cletis Media note, surgery 11/2012  . Vaginitis 09/13/2011   Past Surgical History:  Procedure Laterality Date  . DILATATION & CURRETTAGE/HYSTEROSCOPY WITH RESECTOCOPE N/A 11/06/2012   Procedure: DILATATION & CURETTAGE/HYSTEROSCOPY WITH RESECTOCOPE;  Surgeon: Alwyn Pea, MD;  Location: [facility];  Service: Gynecology;  Laterality: N/A;  . HERNIA REPAIR  1992  . small intestine hernia  1994  . WISDOM TOOTH EXTRACTION     Social History   Socioeconomic History  . Marital status: Married    Spouse name: Not on file  . Number of children: Not on file  . Years of education: Not on file  . Highest education level: Not on file  Occupational History  . Not on file  Social Needs  . Financial resource strain:  Not on file  . Food insecurity:    Worry: Not on file    Inability: Not on file  . Transportation needs:    Medical: Not on file    Non-medical: Not on file  Tobacco Use  . Smoking status: Former Smoker    Packs/day: 0.30    Years: 10.00    Pack years: 3.00    Types: Cigarettes    Last attempt to quit: 07/09/1978    Years since quitting: 40.0  . Smokeless tobacco: Never Used  Substance and Sexual Activity  . Alcohol use: Yes    Comment: 1-2 glasses of wine daily and a 12 oz beer daily  . Drug use: No  . Sexual activity: Never  Lifestyle  . Physical activity:    Days per week: Not on file    Minutes per session: Not on file  . Stress: Not on file  Relationships  . Social connections:    Talks on phone: Not on file    Gets together: Not on file    Attends religious service: Not on file    Active member of club or organization: Not on file    Attends meetings of clubs or organizations: Not on file    Relationship status: Not on file  Other Topics Concern  . Not on file  Social History Narrative  . Not on file  Allergies  Allergen Reactions  . Penicillins Rash    Has patient had a PCN reaction causing immediate rash, facial/tongue/throat swelling, SOB or lightheadedness with hypotension: no Has patient had a PCN reaction causing severe rash involving mucus membranes or skin necrosis: yes Has patient had a PCN reaction that required hospitalization: no Has patient had a PCN reaction occurring within the last 10 years: no If all of the above answers are "NO", then may proceed with Cephalosporin use.   . Sulfa Antibiotics Rash    Under arm and thighs   Family History  Problem Relation Age of Onset  . Heart disease Mother   . Osteoporosis Mother   . Cancer Father        prostate  . Osteoporosis Sister   . Gout Brother   . Gout Maternal Grandmother   . Heart disease Maternal Grandmother   . Alzheimer's disease Maternal Grandfather   . Osteoporosis Sister      Current Outpatient Medications (Endocrine & Metabolic):  .  denosumab (PROLIA) 60 MG/ML SOSY injection, Inject 60 mg into the skin every 6 (six) months.    Current Outpatient Medications (Analgesics):  .  meloxicam (MOBIC) 15 MG tablet, Take 1 tablet (15 mg total) by mouth daily.   Current Outpatient Medications (Other):  .  baclofen (LIORESAL) 10 MG tablet, Take 1 tablet (10 mg total) by mouth 2 (two) times daily. .  Biotin 10 MG TABS, Take 1 tablet by mouth daily.  .  Calcium Carb-Cholecalciferol (CALCIUM + D3 PO), Take 600 mg by mouth daily.  .  Cholecalciferol (VITAMIN D-3) 1000 units CAPS, Take 5,000 Units by mouth.  .  ESTRING 2 MG vaginal ring, Place 2 mg vaginally every 3 (three) months. .  Glucosamine 750 MG TABS, Take 1,200 mg by mouth daily.  .  Magnesium Hydroxide (MAGNESIA PO), Take 1 tablet by mouth daily. OTC .  Omega-3 Fatty Acids (FISH OIL) 1000 MG CAPS, Take 1 capsule by mouth daily. Marland Kitchen  omeprazole (PRILOSEC) 20 MG capsule, Take 1 capsule (20 mg total) by mouth daily.    Past medical history, social, surgical and family history all reviewed in electronic medical record.  No pertanent information unless stated regarding to the chief complaint.   Review of Systems:  No headache, visual changes, nausea, vomiting, diarrhea, constipation, dizziness, abdominal pain, skin rash, fevers, chills, night sweats, weight loss, swollen lymph nodes, body aches, joint swelling, muscle aches, chest pain, shortness of breath, mood changes.   Objective  There were no vitals taken for this visit. Systems examined below as of    General: No apparent distress alert and oriented x3 mood and affect normal, dressed appropriately.  HEENT: Pupils equal, extraocular movements intact  Respiratory: Patient's speak in full sentences and does not appear short of breath  Cardiovascular: No lower extremity edema, non tender, no erythema  Skin: Warm dry intact with no signs of infection or rash  on extremities or on axial skeleton.  Abdomen: Soft nontender  Neuro: Cranial nerves II through XII are intact, neurovascularly intact in all extremities with 2+ DTRs and 2+ pulses.  Lymph: No lymphadenopathy of posterior or anterior cervical chain or axillae bilaterally.  Gait normal with good balance and coordination.  MSK:  Non tender with full range of motion and good stability and symmetric strength and tone of shoulders, elbows, wrist, hip, knee and ankles bilaterally.  Back Exam:  Inspection: Degenerative scoliosis noted Motion: Flexion 30 deg, Extension 25 deg, Side Bending to 35  deg bilaterally,  Rotation to 35 deg bilaterally  SLR laying: Negative  XSLR laying: Negative  Palpable tenderness: Diffusely tender to palpation in the paraspinal musculature. FABER: negative. Sensory change: Gross sensation intact to all lumbar and sacral dermatomes.  Reflexes: 2+ at both patellar tendons, 2+ at achilles tendons, Babinski's downgoing.  Strength at foot  Plantar-flexion: 5/5 Dorsi-flexion: 5/5 Eversion: 5/5 Inversion: 5/5  Leg strength  Quad: 5/5 Hamstring: 5/5 Hip flexor: 5/5 Hip abductors: 5/5  Gait unremarkable.  Osteopathic findings Cervical C2 flexed rotated and side bent right T3-7 neutral right rotated left side bending L2 flexed rotated and side bent right Sacrum right on right    Impression and Recommendations:     This case required medical decision making of moderate complexity. The above documentation has been reviewed and is accurate and complete Lyndal Pulley, DO       Note: This dictation was prepared with Dragon dictation along with smaller phrase technology. Any transcriptional errors that result from this process are unintentional.

## 2018-07-22 ENCOUNTER — Ambulatory Visit: Payer: Self-pay | Admitting: Family Medicine

## 2018-07-22 ENCOUNTER — Ambulatory Visit (INDEPENDENT_AMBULATORY_CARE_PROVIDER_SITE_OTHER): Payer: Medicare Other | Admitting: Family Medicine

## 2018-07-22 ENCOUNTER — Encounter: Payer: Self-pay | Admitting: Family Medicine

## 2018-07-22 VITALS — BP 92/66 | HR 79 | Ht 66.0 in | Wt 111.0 lb

## 2018-07-22 DIAGNOSIS — M47818 Spondylosis without myelopathy or radiculopathy, sacral and sacrococcygeal region: Secondary | ICD-10-CM | POA: Diagnosis not present

## 2018-07-22 DIAGNOSIS — M999 Biomechanical lesion, unspecified: Secondary | ICD-10-CM

## 2018-07-22 DIAGNOSIS — M461 Sacroiliitis, not elsewhere classified: Secondary | ICD-10-CM

## 2018-07-22 NOTE — Assessment & Plan Note (Signed)
Decision today to treat with OMT was based on Physical Exam  After verbal consent patient was treated with HVLA, ME, FPR techniques in  thoracic, lumbar and sacral areas  Patient tolerated the procedure well with improvement in symptoms  Patient given exercises, stretches and lifestyle modifications  See medications in patient instructions if given  Patient will follow up in 4-8 weeks 

## 2018-07-22 NOTE — Patient Instructions (Signed)
Good to see you  Keep it up  Stay active  Continue the exercises  Try an inversion table but likely will need TEETER brands Otherwise see me again in 4-6 weeks Happy New Year!

## 2018-07-22 NOTE — Assessment & Plan Note (Signed)
Moderate to severe with patient having degenerative spinal stenosis lumbar spine as well.  Responding fairly well to osteopathic manipulation.  Many other health issues that I also contributing.  Patient is also being the primary caregiver for her husband who is in his 43s.  Encourage patient to continue to try to stay active on a regular basis.  Follow-up again with me in 4 to 8 weeks

## 2018-08-12 DIAGNOSIS — H02051 Trichiasis without entropian right upper eyelid: Secondary | ICD-10-CM | POA: Diagnosis not present

## 2018-08-19 ENCOUNTER — Encounter: Payer: Self-pay | Admitting: Family Medicine

## 2018-08-19 ENCOUNTER — Ambulatory Visit: Payer: Self-pay | Admitting: Family Medicine

## 2018-08-19 ENCOUNTER — Ambulatory Visit (INDEPENDENT_AMBULATORY_CARE_PROVIDER_SITE_OTHER): Payer: Medicare Other | Admitting: Family Medicine

## 2018-08-19 VITALS — BP 100/60 | HR 87 | Ht 66.0 in | Wt 110.0 lb

## 2018-08-19 DIAGNOSIS — M999 Biomechanical lesion, unspecified: Secondary | ICD-10-CM | POA: Diagnosis not present

## 2018-08-19 DIAGNOSIS — M48061 Spinal stenosis, lumbar region without neurogenic claudication: Secondary | ICD-10-CM

## 2018-08-19 NOTE — Patient Instructions (Signed)
Good to see you  Ice is your friend Continue the vitamins Keep up with the exercises Consider a epidural steroid injection to help calm down the back- I do feel you are getting tighter ans likely it is the muscle protecting the arthritis  See me again in 3-4 weeks

## 2018-08-19 NOTE — Progress Notes (Signed)
[name] [address] [place], [place] [address] Phone: [phone] Subjective:    I Kelsey [name] am serving as a Education administrator for Dr. [name].   CC: Back pain  [id number]  Kelsey [name] is a 73 y.o. female coming in with complaint of back pain. States that she is repeating the healing cycle.  Patient states that has tried to increase activity but is still having difficulty with going from sitting to standing.     Past Medical History:  Diagnosis Date  . Allergy   . Anemia    iron deficiency  . 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 Bladder prolapse, female, acquired   . Chicken pox as a child  . Dysuria 09/13/2011  . Female bladder prolapse 09/13/2011  . Fibrocystic breast 09/14/2011  . GERD (gastroesophageal reflux disease)   . H/O measles   . H/O mumps   . Hyperlipidemia   . 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 Osteoarthrosis    right shoulder  . Osteoporosis    reclast in Oct  . Ovarian cyst 09/14/2011  . [facility] spotted fever) 2017  . Uterine fibroid 11/2012   See Dr. Cletis Media note, surgery 11/2012  . Vaginitis 09/13/2011   Past Surgical History:  Procedure Laterality Date  . DILATATION & CURRETTAGE/HYSTEROSCOPY WITH RESECTOCOPE N/A 11/06/2012   Procedure: DILATATION & CURETTAGE/HYSTEROSCOPY WITH RESECTOCOPE;  Surgeon: Alwyn Pea, MD;  Location: [facility];  Service: Gynecology;  Laterality: N/A;  . HERNIA REPAIR  1992  . small intestine hernia  1994  . WISDOM TOOTH EXTRACTION     Social History   Socioeconomic History  . Marital status: Married    Spouse name: Not on file  . Number of children: Not on file  . Years of education: Not on file  . Highest education level: Not on file  Occupational History  . Not on file  Social Needs  . Financial resource strain: Not on file  . Food insecurity:    Worry: Not on file    Inability: Not on file  . Transportation needs:    Medical: Not on file    Non-medical: Not on file    Tobacco Use  . Smoking status: Former Smoker    Packs/day: 0.30    Years: 10.00    Pack years: 3.00    Types: Cigarettes    Last attempt to quit: 07/09/1978    Years since quitting: 40.1  . Smokeless tobacco: Never Used  Substance and Sexual Activity  . Alcohol use: Yes    Comment: 1-2 glasses of wine daily and a 12 oz beer daily  . Drug use: No  . Sexual activity: Never  Lifestyle  . Physical activity:    Days per week: Not on file    Minutes per session: Not on file  . Stress: Not on file  Relationships  . Social connections:    Talks on phone: Not on file    Gets together: Not on file    Attends religious service: Not on file    Active member of club or organization: Not on file    Attends meetings of clubs or organizations: Not on file    Relationship status: Not on file  Other Topics Concern  . Not on file  Social History Narrative  . Not on file   Allergies  Allergen Reactions  . Penicillins Rash    Has patient had a PCN reaction causing immediate rash, facial/tongue/throat swelling, SOB or  lightheadedness with hypotension: no Has patient had a PCN reaction causing severe rash involving mucus membranes or skin necrosis: yes Has patient had a PCN reaction that required hospitalization: no Has patient had a PCN reaction occurring within the last 10 years: no If all of the above answers are "NO", then may proceed with Cephalosporin use.   . Sulfa Antibiotics Rash    Under arm and thighs   Family History  Problem Relation Age of Onset  . Heart disease Mother   . Osteoporosis Mother   . Cancer Father        prostate  . Osteoporosis Sister   . Gout Brother   . Gout Maternal Grandmother   . Heart disease Maternal Grandmother   . Alzheimer's disease Maternal Grandfather   . Osteoporosis Sister     Current Outpatient Medications (Endocrine & Metabolic):  .  denosumab (PROLIA) 60 MG/ML SOSY injection, Inject 60 mg into the skin every 6 (six)  months.    Current Outpatient Medications (Analgesics):  .  meloxicam (MOBIC) 15 MG tablet, Take 1 tablet (15 mg total) by mouth daily.   Current Outpatient Medications (Other):  .  baclofen (LIORESAL) 10 MG tablet, Take 1 tablet (10 mg total) by mouth 2 (two) times daily. .  Biotin 10 MG TABS, Take 1 tablet by mouth daily.  .  Calcium Carb-Cholecalciferol (CALCIUM + D3 PO), Take 600 mg by mouth daily.  .  Cholecalciferol (VITAMIN D-3) 1000 units CAPS, Take 5,000 Units by mouth.  .  ESTRING 2 MG vaginal ring, Place 2 mg vaginally every 3 (three) months. .  Glucosamine 750 MG TABS, Take 1,200 mg by mouth daily.  .  Magnesium Hydroxide (MAGNESIA PO), Take 1 tablet by mouth daily. OTC .  Omega-3 Fatty Acids (FISH OIL) 1000 MG CAPS, Take 1 capsule by mouth daily. Marland Kitchen  omeprazole (PRILOSEC) 20 MG capsule, Take 1 capsule (20 mg total) by mouth daily.    Past medical history, social, surgical and family history all reviewed in electronic medical record.  No pertanent information unless stated regarding to the chief complaint.   Review of Systems:  No headache, visual changes, nausea, vomiting, diarrhea, constipation, dizziness, abdominal pain, skin rash, fevers, chills, night sweats, weight loss, swollen lymph nodes, body aches, joint swelling,, chest pain, shortness of breath, mood changes.  Positive muscle aches  Objective  Blood pressure 100/60, pulse 87, height 5\' 6"  (1.676 m), weight 110 lb (49.9 kg), SpO2 96 %.    General: No apparent distress alert and oriented x3 mood and affect normal, dressed appropriately.  HEENT: Pupils equal, extraocular movements intact  Respiratory: Patient's speak in full sentences and does not appear short of breath  Cardiovascular: No lower extremity edema, non tender, no erythema  Skin: Warm dry intact with no signs of infection or rash on extremities or on axial skeleton.  Abdomen: Soft nontender  Neuro: Cranial nerves II through XII are intact,  neurovascularly intact in all extremities with 2+ DTRs and 2+ pulses.  Lymph: No lymphadenopathy of posterior or anterior cervical chain or axillae bilaterally.  Gait walks upright secondary to stiff back MSK:  Non tender with full range of motion and good stability and symmetric strength and tone of shoulders, elbows, wrist, hip, knee and ankles bilaterally.  Arthritic changes of multiple joints  Back Exam:  Inspection: Loss of lordosis with some degenerative scoliosis Motion: Flexion 35 deg, Extension 25 deg, Side Bending to 35 deg bilaterally,  Rotation to 35 deg bilaterally  SLR laying: Negative  XSLR laying: Negative  Palpable tenderness: Mild tender to palpation in the paraspinal musculature and severe around the sacroiliac joint bilaterally.Marland Kitchen FABER: Positive bilaterally with increasing stiffness. Sensory change: Gross sensation intact to all lumbar and sacral dermatomes.  Reflexes: 2+ at both patellar tendons, 2+ at achilles tendons, Babinski's downgoing.  Strength at foot  Plantar-flexion: 5/5 Dorsi-flexion: 5/5 Eversion: 5/5 Inversion: 5/5  Leg strength  Quad: 5/5 Hamstring: 5/5 Hip flexor: 5/5 Hip abductors: 5/5  Gait unremarkable.  Osteopathic findings C6 flexed rotated and side bent left T3 extended rotated and side bent right inhaled third rib T9 extended rotated and side bent left L2 flexed rotated and side bent right L4 flexed rotated side bent left Sacrum right on right    Impression and Recommendations:     This case required medical decision making of moderate complexity. The above documentation has been reviewed and is accurate and complete Lyndal Pulley, DO       Note: This dictation was prepared with Dragon dictation along with smaller phrase technology. Any transcriptional errors that result from this process are unintentional.

## 2018-08-19 NOTE — Assessment & Plan Note (Signed)
Decision today to treat with OMT was based on Physical Exam  After verbal consent patient was treated with HVLA, ME, FPR techniques in  thoracic, lumbar and sacral areas  Patient tolerated the procedure well with improvement in symptoms  Patient given exercises, stretches and lifestyle modifications  See medications in patient instructions if given  Patient will follow up in 3-4 weeks 

## 2018-08-19 NOTE — Assessment & Plan Note (Signed)
Degenerative lumbar spondylosis.  Concern the patient is having worsening symptoms.  May need to think the possibility of an epidural.  Patient declined that at the moment.  If continued to worsen would consider.  The patient has been continuing to have difficulty overall with multiple different health issues as well as patient's underlying anxiety and being the primary caregiver for her husband.  Patient is going continue to try to increase activity.  Follow-up with me again in 3 to 4 weeks

## 2018-09-11 ENCOUNTER — Encounter: Payer: Self-pay | Admitting: Family Medicine

## 2018-09-11 ENCOUNTER — Ambulatory Visit (INDEPENDENT_AMBULATORY_CARE_PROVIDER_SITE_OTHER): Payer: Medicare Other | Admitting: Family Medicine

## 2018-09-11 VITALS — BP 96/70 | HR 85 | Ht 66.0 in | Wt 110.0 lb

## 2018-09-11 DIAGNOSIS — M999 Biomechanical lesion, unspecified: Secondary | ICD-10-CM

## 2018-09-11 DIAGNOSIS — M48061 Spinal stenosis, lumbar region without neurogenic claudication: Secondary | ICD-10-CM | POA: Diagnosis not present

## 2018-09-11 NOTE — Progress Notes (Signed)
[name] [address] [place], [place] [address] Phone: [phone] Subjective:      CC: Right hip pain and back pain follow-up  [id number]  Kelsey [name] is a 73 y.o. female coming in with complaint of right hip and lumbar spine pain. Pain increases with sit to stand and prolonged sitting. Pain has improved over time with OMT.  Severe arthritic changes with spinal stenosis.  Patient states that he continues to try to do the exercises occasionally.  Has not been very compliant on doing them regularly and is not doing the vitamins that seem to be making a difference.     Past Medical History:  Diagnosis Date  . Allergy   . Anemia    iron deficiency  . 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 Bladder prolapse, female, acquired   . Chicken pox as a child  . Dysuria 09/13/2011  . Female bladder prolapse 09/13/2011  . Fibrocystic breast 09/14/2011  . GERD (gastroesophageal reflux disease)   . H/O measles   . H/O mumps   . Hyperlipidemia   . 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 Osteoarthrosis    right shoulder  . Osteoporosis    reclast in Oct  . Ovarian cyst 09/14/2011  . [facility] spotted fever) 2017  . Uterine fibroid 11/2012   See Dr. Cletis Media note, surgery 11/2012  . Vaginitis 09/13/2011   Past Surgical History:  Procedure Laterality Date  . DILATATION & CURRETTAGE/HYSTEROSCOPY WITH RESECTOCOPE N/A 11/06/2012   Procedure: DILATATION & CURETTAGE/HYSTEROSCOPY WITH RESECTOCOPE;  Surgeon: Alwyn Pea, MD;  Location: [facility];  Service: Gynecology;  Laterality: N/A;  . HERNIA REPAIR  1992  . small intestine hernia  1994  . WISDOM TOOTH EXTRACTION     Social History   Socioeconomic History  . Marital status: Married    Spouse name: Not on file  . Number of children: Not on file  . Years of education: Not on file  . Highest education level: Not on file  Occupational History  . Not on file  Social Needs  . Financial resource strain: Not on  file  . Food insecurity:    Worry: Not on file    Inability: Not on file  . Transportation needs:    Medical: Not on file    Non-medical: Not on file  Tobacco Use  . Smoking status: Former Smoker    Packs/day: 0.30    Years: 10.00    Pack years: 3.00    Types: Cigarettes    Last attempt to quit: 07/09/1978    Years since quitting: 40.2  . Smokeless tobacco: Never Used  Substance and Sexual Activity  . Alcohol use: Yes    Comment: 1-2 glasses of wine daily and a 12 oz beer daily  . Drug use: No  . Sexual activity: Never  Lifestyle  . Physical activity:    Days per week: Not on file    Minutes per session: Not on file  . Stress: Not on file  Relationships  . Social connections:    Talks on phone: Not on file    Gets together: Not on file    Attends religious service: Not on file    Active member of club or organization: Not on file    Attends meetings of clubs or organizations: Not on file    Relationship status: Not on file  Other Topics Concern  . Not on file  Social History Narrative  . Not on  file   Allergies  Allergen Reactions  . Penicillins Rash    Has patient had a PCN reaction causing immediate rash, facial/tongue/throat swelling, SOB or lightheadedness with hypotension: no Has patient had a PCN reaction causing severe rash involving mucus membranes or skin necrosis: yes Has patient had a PCN reaction that required hospitalization: no Has patient had a PCN reaction occurring within the last 10 years: no If all of the above answers are "NO", then may proceed with Cephalosporin use.   . Sulfa Antibiotics Rash    Under arm and thighs   Family History  Problem Relation Age of Onset  . Heart disease Mother   . Osteoporosis Mother   . Cancer Father        prostate  . Osteoporosis Sister   . Gout Brother   . Gout Maternal Grandmother   . Heart disease Maternal Grandmother   . Alzheimer's disease Maternal Grandfather   . Osteoporosis Sister     Current  Outpatient Medications (Endocrine & Metabolic):  .  denosumab (PROLIA) 60 MG/ML SOSY injection, Inject 60 mg into the skin every 6 (six) months.    Current Outpatient Medications (Analgesics):  .  meloxicam (MOBIC) 15 MG tablet, Take 1 tablet (15 mg total) by mouth daily.   Current Outpatient Medications (Other):  .  baclofen (LIORESAL) 10 MG tablet, Take 1 tablet (10 mg total) by mouth 2 (two) times daily. .  Biotin 10 MG TABS, Take 1 tablet by mouth daily.  .  Calcium Carb-Cholecalciferol (CALCIUM + D3 PO), Take 600 mg by mouth daily.  .  Cholecalciferol (VITAMIN D-3) 1000 units CAPS, Take 5,000 Units by mouth.  .  ESTRING 2 MG vaginal ring, Place 2 mg vaginally every 3 (three) months. .  Glucosamine 750 MG TABS, Take 1,200 mg by mouth daily.  .  Magnesium Hydroxide (MAGNESIA PO), Take 1 tablet by mouth daily. OTC .  Omega-3 Fatty Acids (FISH OIL) 1000 MG CAPS, Take 1 capsule by mouth daily. Marland Kitchen  omeprazole (PRILOSEC) 20 MG capsule, Take 1 capsule (20 mg total) by mouth daily.    Past medical history, social, surgical and family history all reviewed in electronic medical record.  No pertanent information unless stated regarding to the chief complaint.   Review of Systems:  No headache, visual changes, nausea, vomiting, diarrhea, constipation, dizziness, abdominal pain, skin rash, fevers, chills, night sweats, weight loss, swollen lymph nodes, body aches, joint swelling, chest pain, shortness of breath, mood changes.  Positive muscle aches  Objective  Blood pressure 96/70, pulse 85, height 5\' 6"  (1.676 m), weight 110 lb (49.9 kg), SpO2 97 %.   General: No apparent distress alert and oriented x3 mood and affect normal, dressed appropriately.  HEENT: Pupils equal, extraocular movements intact  Respiratory: Patient's speak in full sentences and does not appear short of breath  Cardiovascular: No lower extremity edema, non tender, no erythema  Skin: Warm dry intact with no signs of  infection or rash on extremities or on axial skeleton.  Abdomen: Soft nontender  Neuro: Cranial nerves II through XII are intact, neurovascularly intact in all extremities with 2+ DTRs and 2+ pulses.  Lymph: No lymphadenopathy of posterior or anterior cervical chain or axillae bilaterally.  Gait antalgic MSK:  tender with limited range of motion and good stability and symmetric strength and tone of shoulders, elbows, wrist, hip, knee and ankles bilaterally.  Lumbar spine shows the patient does have severe loss of lordosis.  Patient is severely tight even  going from a sitting position.  Tender to palpation of the paraspinal musculature lumbar spine diffusely.  Negative straight leg test but severe tightness of the hamstrings.  Unable to do [facility] test secondary to the tightness either at the moment.  Mild atrophy of the legs but seems to be with symmetric strength deep tendon reflexes intact  Osteopathic findings  C6 flexed rotated and side bent right T3 extended rotated and side bent right L2 flexed rotated and side bent right L4 flexed rotated and side bent left Sacrum right on right    Impression and Recommendations:     This case required medical decision making of moderate complexity. The above documentation has been reviewed and is accurate and complete Lyndal Pulley, DO       Note: This dictation was prepared with Dragon dictation along with smaller phrase technology. Any transcriptional errors that result from this process are unintentional.

## 2018-09-11 NOTE — Patient Instructions (Addendum)
Great to see you  Stay as active as you can and make sure you are having some time for you  I still believe the epidural is an option but Continue the vitamins regularly and lets see how we do See me again in 4 weeks

## 2018-09-11 NOTE — Assessment & Plan Note (Signed)
Severe overall.  Discussed with patient again about the possibility of epidurals.  Patient has declined this previously and still wants to continue with conservative therapy.  I do believe that patient's quality of life is slowly deteriorating. Discussed laboratory work-up patient has been somewhat cachectic.  I believe a lot of it is secondary to stress and being the primary caregiver for her ailing husband.  Discussed with patient about icing regimen, home exercises has meloxicam for breakthrough pain.  Patient will follow-up with me again in 4 weeks

## 2018-09-11 NOTE — Assessment & Plan Note (Signed)
Decision today to treat with OMT was based on Physical Exam  After verbal consent patient was treated with  ME, FPR techniques in cervical, thoracic, rib lumbar and sacral areas  Patient tolerated the procedure well with improvement in symptoms  Patient given exercises, stretches and lifestyle modifications  See medications in patient instructions if given  Patient will follow up in 4 weeks

## 2018-10-22 ENCOUNTER — Ambulatory Visit: Payer: Self-pay | Admitting: Family Medicine

## 2018-11-12 ENCOUNTER — Ambulatory Visit (INDEPENDENT_AMBULATORY_CARE_PROVIDER_SITE_OTHER): Payer: Medicare Other | Admitting: Family Medicine

## 2018-11-12 ENCOUNTER — Other Ambulatory Visit: Payer: Self-pay

## 2018-11-12 ENCOUNTER — Ambulatory Visit: Payer: Self-pay | Admitting: Family Medicine

## 2018-11-12 ENCOUNTER — Encounter: Payer: Self-pay | Admitting: Family Medicine

## 2018-11-12 VITALS — BP 100/58 | HR 79 | Ht 66.0 in | Wt 106.0 lb

## 2018-11-12 DIAGNOSIS — M533 Sacrococcygeal disorders, not elsewhere classified: Secondary | ICD-10-CM | POA: Diagnosis not present

## 2018-11-12 DIAGNOSIS — M999 Biomechanical lesion, unspecified: Secondary | ICD-10-CM | POA: Diagnosis not present

## 2018-11-12 NOTE — Assessment & Plan Note (Signed)
Decision today to treat with OMT was based on Physical Exam  After verbal consent patient was treated with HVLA, ME, FPR techniques in  thoracic, lumbar and sacral areas  Patient tolerated the procedure well with improvement in symptoms  Patient given exercises, stretches and lifestyle modifications  See medications in patient instructions if given  Patient will follow up in 4 weeks 

## 2018-11-12 NOTE — Progress Notes (Signed)
[name] [place], [place] [address] Phone: [phone] Subjective:     CC:  Lower back pain   [id number]  Kelsey [name] is a 73 y.o. female coming in with complaint of lower back pain. She is having left sided low back pain that is dull. Has not been as active due to Covid. Is also taking care of her husband full time as the adult day care he attends is not open.  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  Has to do some more lifting recently.  Looking into getting him a hospital bed here in the near future.    Past Medical History:  Diagnosis Date  . Allergy   . Anemia    iron deficiency  . 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 Bladder prolapse, female, acquired   . Chicken pox as a child  . Dysuria 09/13/2011  . Female bladder prolapse 09/13/2011  . Fibrocystic breast 09/14/2011  . GERD (gastroesophageal reflux disease)   . H/O measles   . H/O mumps   . Hyperlipidemia   . 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 Osteoarthrosis    right shoulder  . Osteoporosis    reclast in Oct  . Ovarian cyst 09/14/2011  . [facility] spotted fever) 2017  . Uterine fibroid 11/2012   See Dr. Cletis Media note, surgery 11/2012  . Vaginitis 09/13/2011   Past Surgical History:  Procedure Laterality Date  . DILATATION & CURRETTAGE/HYSTEROSCOPY WITH RESECTOCOPE N/A 11/06/2012   Procedure: DILATATION & CURETTAGE/HYSTEROSCOPY WITH RESECTOCOPE;  Surgeon: Alwyn Pea, MD;  Location: [facility];  Service: Gynecology;  Laterality: N/A;  . HERNIA REPAIR  1992  . small intestine hernia  1994  . WISDOM TOOTH EXTRACTION     Social History   Socioeconomic History  . Marital status: Married    Spouse name: Not on file  . Number of children: Not on file  . Years of education: Not on file  . Highest education level: Not on file  Occupational History  . Not on file  Social Needs  . Financial resource strain: Not on file  . Food insecurity:    Worry:  Not on file    Inability: Not on file  . Transportation needs:    Medical: Not on file    Non-medical: Not on file  Tobacco Use  . Smoking status: Former Smoker    Packs/day: 0.30    Years: 10.00    Pack years: 3.00    Types: Cigarettes    Last attempt to quit: 07/09/1978    Years since quitting: 40.3  . Smokeless tobacco: Never Used  Substance and Sexual Activity  . Alcohol use: Yes    Comment: 1-2 glasses of wine daily and a 12 oz beer daily  . Drug use: No  . Sexual activity: Never  Lifestyle  . Physical activity:    Days per week: Not on file    Minutes per session: Not on file  . Stress: Not on file  Relationships  . Social connections:    Talks on phone: Not on file    Gets together: Not on file    Attends religious service: Not on file    Active member of club or organization: Not on file    Attends meetings of clubs or organizations: Not on file    Relationship status: Not on file  Other Topics Concern  . Not on file  Social History Narrative  .  Not on file   Allergies  Allergen Reactions  . Penicillins Rash    Has patient had a PCN reaction causing immediate rash, facial/tongue/throat swelling, SOB or lightheadedness with hypotension: no Has patient had a PCN reaction causing severe rash involving mucus membranes or skin necrosis: yes Has patient had a PCN reaction that required hospitalization: no Has patient had a PCN reaction occurring within the last 10 years: no If all of the above answers are "NO", then may proceed with Cephalosporin use.   . Sulfa Antibiotics Rash    Under arm and thighs   Family History  Problem Relation Age of Onset  . Heart disease Mother   . Osteoporosis Mother   . Cancer Father        prostate  . Osteoporosis Sister   . Gout Brother   . Gout Maternal Grandmother   . Heart disease Maternal Grandmother   . Alzheimer's disease Maternal Grandfather   . Osteoporosis Sister     Current Outpatient Medications (Endocrine &  Metabolic):  .  denosumab (PROLIA) 60 MG/ML SOSY injection, Inject 60 mg into the skin every 6 (six) months.    Current Outpatient Medications (Analgesics):  .  meloxicam (MOBIC) 15 MG tablet, Take 1 tablet (15 mg total) by mouth daily.   Current Outpatient Medications (Other):  .  baclofen (LIORESAL) 10 MG tablet, Take 1 tablet (10 mg total) by mouth 2 (two) times daily. .  Biotin 10 MG TABS, Take 1 tablet by mouth daily.  .  Calcium Carb-Cholecalciferol (CALCIUM + D3 PO), Take 600 mg by mouth daily.  .  Cholecalciferol (VITAMIN D-3) 1000 units CAPS, Take 5,000 Units by mouth.  .  ESTRING 2 MG vaginal ring, Place 2 mg vaginally every 3 (three) months. .  Glucosamine 750 MG TABS, Take 1,200 mg by mouth daily.  .  Magnesium Hydroxide (MAGNESIA PO), Take 1 tablet by mouth daily. OTC .  Omega-3 Fatty Acids (FISH OIL) 1000 MG CAPS, Take 1 capsule by mouth daily. Marland Kitchen  omeprazole (PRILOSEC) 20 MG capsule, Take 1 capsule (20 mg total) by mouth daily.    Past medical history, social, surgical and family history all reviewed in electronic medical record.  No pertanent information unless stated regarding to the chief complaint.   Review of Systems:  No headache, visual changes, nausea, vomiting, diarrhea, constipation, dizziness, abdominal pain, skin rash, fevers, chills, night sweats, weight loss, swollen lymph nodes, body aches, joint swelling,, chest pain, shortness of breath, mood changes.  Positive muscle aches  Objective  Blood pressure (!) 100/58, pulse 79, height 5\' 6"  (1.676 m), weight 106 lb (48.1 kg), SpO2 98 %.   General: No apparent distress alert and oriented x3 mood and affect normal, dressed appropriately.  HEENT: Pupils equal, extraocular movements intact  Respiratory: Patient's speak in full sentences and does not appear short of breath  Cardiovascular: No lower extremity edema, non tender, no erythema  Skin: Warm dry intact with no signs of infection or rash on extremities or  on axial skeleton.  Abdomen: Soft nontender  Neuro: Cranial nerves II through XII are intact, neurovascularly intact in all extremities with 2+ DTRs and 2+ pulses.  Lymph: No lymphadenopathy of posterior or anterior cervical chain or axillae bilaterally.  Gait antalgic MSK: Arthritic changes of multiple joints but does have near full range of motion of the extremities  Patient is back no severe loss of lordosis.  Has decreased range of motion.  Severe tenderness over the sacroiliac joint.  Some degenerative scoliosis also noted.   Osteopathic findings  T9 extended rotated and side bent left L2 flexed rotated and side bent right Sacrum right on right  This case required medical decision making of moderate complexity. The above documentation has been reviewed and is accurate and complete Lyndal Pulley, DO       Note: This dictation was prepared with Dragon dictation along with smaller phrase technology. Any transcriptional errors that result from this process are unintentional.

## 2018-11-12 NOTE — Assessment & Plan Note (Signed)
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  We discussed with patient in great length.  Discussed home exercises and icing regimen again.  Patient wants to avoid any significant amount of medications especially neither would make her groggy while she is taking care of her husband.  Responding fairly well to manipulation.  We will continue with this therapy and return in 4 weeks

## 2018-11-12 NOTE — Patient Instructions (Signed)
Be safe  Keep working on the stretches See me again in 4 weeks

## 2018-12-10 ENCOUNTER — Encounter: Payer: Self-pay | Admitting: Family Medicine

## 2018-12-10 ENCOUNTER — Ambulatory Visit (INDEPENDENT_AMBULATORY_CARE_PROVIDER_SITE_OTHER): Payer: Medicare Other | Admitting: Family Medicine

## 2018-12-10 ENCOUNTER — Other Ambulatory Visit: Payer: Self-pay

## 2018-12-10 VITALS — BP 100/62 | HR 86 | Ht 66.0 in | Wt 107.0 lb

## 2018-12-10 DIAGNOSIS — M533 Sacrococcygeal disorders, not elsewhere classified: Secondary | ICD-10-CM

## 2018-12-10 DIAGNOSIS — M47818 Spondylosis without myelopathy or radiculopathy, sacral and sacrococcygeal region: Secondary | ICD-10-CM | POA: Diagnosis not present

## 2018-12-10 DIAGNOSIS — M48061 Spinal stenosis, lumbar region without neurogenic claudication: Secondary | ICD-10-CM | POA: Diagnosis not present

## 2018-12-10 DIAGNOSIS — M999 Biomechanical lesion, unspecified: Secondary | ICD-10-CM | POA: Diagnosis not present

## 2018-12-10 MED ORDER — ACYCLOVIR 400 MG PO TABS: 400.0000 mg | ORAL_TABLET | Freq: Three times a day (TID) | ORAL | 0 refills | Status: AC

## 2018-12-10 NOTE — Assessment & Plan Note (Signed)
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  Underlying osteoporosis is also concerning.  Continue with the vitamin D supplementation.  Continue the muscle strengthening.  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  Does a lot of lifting.  We discussed the mechanical aspect of that.  Follow-up again in 4 to 6 weeks

## 2018-12-10 NOTE — Progress Notes (Signed)
[name] [place], [place] [address] Phone: [phone] Subjective:   I Kelsey [name] am serving as a Education administrator for Dr. Hulan [name].   CC: back pain   [id number]  Kelsey [name] is a 73 y.o. female coming in with complaint of back pain. States that she is painful all over. Pain in the shoulder blade with over head reaching.  Known severe arthritis and degenerative disc disease.  Patient has been doing relatively good overall.  Rates the severity of pain now is 9 out of 10. That only happens for seconds and then seems to resolve.  Intermittent radicular symptoms.  Worse with lifting.      Past Medical History:  Diagnosis Date  . Allergy   . Anemia    iron deficiency  . 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 Bladder prolapse, female, acquired   . Chicken pox as a child  . Dysuria 09/13/2011  . Female bladder prolapse 09/13/2011  . Fibrocystic breast 09/14/2011  . GERD (gastroesophageal reflux disease)   . H/O measles   . H/O mumps   . Hyperlipidemia   . 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 Osteoarthrosis    right shoulder  . Osteoporosis    reclast in Oct  . Ovarian cyst 09/14/2011  . [facility] spotted fever) 2017  . Uterine fibroid 11/2012   See Dr. Cletis [name] note, surgery 11/2012  . Vaginitis 09/13/2011   Past Surgical History:  Procedure Laterality Date  . DILATATION & CURRETTAGE/HYSTEROSCOPY WITH RESECTOCOPE N/A 11/06/2012   Procedure: DILATATION & CURETTAGE/HYSTEROSCOPY WITH RESECTOCOPE;  Surgeon: Kelsey Pea, MD;  Location: [name];  Service: Gynecology;  Laterality: N/A;  . HERNIA REPAIR  1992  . small intestine hernia  1994  . WISDOM TOOTH EXTRACTION     Social History   Socioeconomic History  . Marital status: Married    Spouse name: Not on file  . Number of children: Not on file  . Years of education: Not on file  . Highest education level: Not on file  Occupational History  . Not on file  Social Needs  .  Financial resource strain: Not on file  . Food insecurity:    Worry: Not on file    Inability: Not on file  . Transportation needs:    Medical: Not on file    Non-medical: Not on file  Tobacco Use  . Smoking status: Former Smoker    Packs/day: 0.30    Years: 10.00    Pack years: 3.00    Types: Cigarettes    Last attempt to quit: 07/09/1978    Years since quitting: 40.4  . Smokeless tobacco: Never Used  Substance and Sexual Activity  . Alcohol use: Yes    Comment: 1-2 glasses of wine daily and a 12 oz beer daily  . Drug use: No  . Sexual activity: Never  Lifestyle  . Physical activity:    Days per week: Not on file    Minutes per session: Not on file  . Stress: Not on file  Relationships  . Social connections:    Talks on phone: Not on file    Gets together: Not on file    Attends religious service: Not on file    Active member of club or organization: Not on file    Attends meetings of clubs or organizations: Not on file    Relationship status: Not on file  Other Topics Concern  . Not on file  Social History Narrative  . Not on file   Allergies  Allergen Reactions  . Penicillins Rash    Has patient had a PCN reaction causing immediate rash, facial/tongue/throat swelling, SOB or lightheadedness with hypotension: no Has patient had a PCN reaction causing severe rash involving mucus membranes or skin necrosis: yes Has patient had a PCN reaction that required hospitalization: no Has patient had a PCN reaction occurring within the last 10 years: no If all of the above answers are "NO", then may proceed with Cephalosporin use.   . Sulfa Antibiotics Rash    Under arm and thighs   Family History  Problem Relation Age of Onset  . Heart disease Mother   . Osteoporosis Mother   . Cancer Father        prostate  . Osteoporosis Sister   . Gout Brother   . Gout Maternal Grandmother   . Heart disease Maternal Grandmother   . Alzheimer's disease Maternal Grandfather   .  Osteoporosis Sister     Current Outpatient Medications (Endocrine & Metabolic):  .  denosumab (PROLIA) 60 MG/ML SOSY injection, Inject 60 mg into the skin every 6 (six) months.    Current Outpatient Medications (Analgesics):  .  meloxicam (MOBIC) 15 MG tablet, Take 1 tablet (15 mg total) by mouth daily.   Current Outpatient Medications (Other):  .  baclofen (LIORESAL) 10 MG tablet, Take 1 tablet (10 mg total) by mouth 2 (two) times daily. .  Biotin 10 MG TABS, Take 1 tablet by mouth daily.  .  Calcium Carb-Cholecalciferol (CALCIUM + D3 PO), Take 600 mg by mouth daily.  .  Cholecalciferol (VITAMIN D-3) 1000 units CAPS, Take 5,000 Units by mouth.  .  ESTRING 2 MG vaginal ring, Place 2 mg vaginally every 3 (three) months. .  Glucosamine 750 MG TABS, Take 1,200 mg by mouth daily.  .  Magnesium Hydroxide (MAGNESIA PO), Take 1 tablet by mouth daily. OTC .  Omega-3 Fatty Acids (FISH OIL) 1000 MG CAPS, Take 1 capsule by mouth daily. Marland Kitchen  omeprazole (PRILOSEC) 20 MG capsule, Take 1 capsule (20 mg total) by mouth daily. Marland Kitchen  acyclovir (ZOVIRAX) 400 MG tablet, Take 1 tablet (400 mg total) by mouth 3 (three) times daily for 5 days.    Past medical history, social, surgical and family history all reviewed in electronic medical record.  No pertanent information unless stated regarding to the chief complaint.   Review of Systems:  No headache, visual changes, nausea, vomiting, diarrhea, constipation, dizziness, abdominal pain, skin rash, fevers, chills, night sweats, weight loss, swollen lymph nodes, body aches, joint swelling, muscle aches, chest pain, shortness of breath, mood changes.  Positive muscle aches  Objective  Blood pressure 100/62, pulse 86, height 5\' 6"  (1.676 m), weight 107 lb (48.5 kg), SpO2 96 %.   General: No apparent distress alert and oriented x3 mood and affect normal, dressed appropriately.  HEENT: Pupils equal, extraocular movements intact  Respiratory: Patient's speak in full  sentences and does not appear short of breath  Cardiovascular: No lower extremity edema, non tender, no erythema  Skin: Warm dry intact with no signs of infection or rash on extremities or on axial skeleton.  Abdomen: Soft nontender  Neuro: Cranial nerves II through XII are intact, neurovascularly intact in all extremities with 2+ DTRs and 2+ pulses.  Lymph: No lymphadenopathy of posterior or anterior cervical chain or axillae bilaterally.  Gait normal with good balance and coordination.  MSK:  Non tender with full  range of motion and good stability and symmetric strength and tone of shoulders, elbows, wrist, hip, knee and ankles bilaterally.    Back Exam:  Inspection: Loss of lordosis with significant degenerative scoliosis Motion: Flexion 25 deg, Extension 15 deg, Side Bending to 45 deg bilaterally,  Rotation to 45 deg bilaterally  SLR laying: Negative  XSLR laying: Negative  Palpable tenderness: Tender to palpation paraspinal musculature lumbar spine right greater than left.  Diffusely noted tightness with the right Faber test. FABER: negative. Sensory change: Gross sensation intact to all lumbar and sacral dermatomes.  Reflexes: 2+ at both patellar tendons, 2+ at achilles tendons, Babinski's downgoing.  Strength at foot  Plantar-flexion: 5/5 Dorsi-flexion: 5/5 Eversion: 5/5 Inversion: 5/5  Leg strength  Quad: 5/5 Hamstring: 5/5 Hip flexor: 5/5 Hip abductors: 5/5  Gait unremarkable.  Osteopathic findings  T3 extended rotated and side bent right inhaled third rib T5-8 extended rotated and side bent left L2 flexed rotated and side bent right Sacrum right on right  Impression and Recommendations:     This case required medical decision making of moderate complexity. The above documentation has been reviewed and is accurate and complete Lyndal Pulley, DO       Note: This dictation was prepared with Dragon dictation along with smaller phrase technology. Any transcriptional  errors that result from this process are unintentional.

## 2018-12-10 NOTE — Assessment & Plan Note (Signed)
Decision today to treat with OMT was based on Physical Exam  After verbal consent patient was treated with HVLA, ME, FPR techniques in  thoracic, lumbar and sacral areas  Patient tolerated the procedure well with improvement in symptoms  Patient given exercises, stretches and lifestyle modifications  See medications in patient instructions if given  Patient will follow up in 4-6 weeks 

## 2018-12-10 NOTE — Patient Instructions (Signed)
Good to see you  Acyclovir 3 times daily for 5 days for the shingles.  Try to find 20 minutes for yourself a day to do the stretches  See me again in 5 weeks

## 2018-12-24 ENCOUNTER — Other Ambulatory Visit: Payer: Self-pay | Admitting: Family Medicine

## 2018-12-24 ENCOUNTER — Telehealth: Payer: Self-pay | Admitting: Family Medicine

## 2018-12-24 DIAGNOSIS — K219 Gastro-esophageal reflux disease without esophagitis: Secondary | ICD-10-CM

## 2018-12-24 NOTE — Telephone Encounter (Signed)
Pt was called and was told we needed to schedule appt. When talking to patient she asked when the last time her Estring was changed, pt was told per instructions it is to be changed q 3 months. Pt stated she has not changed or taken out since 11/2017. Pt was scheduled for appt but cannot come until Friday. I asked pt multiple times if she has taken out vaginal ring and she stated she has not. There was a language barrier. I told pt to remove if she was able.   Dr [name] Juluis Rainier

## 2018-12-24 NOTE — Telephone Encounter (Signed)
Patient's need to change their vaginal rings every 3 months as the label reads.  If she is not able to change every 3 months, then there are other forms of estrogen replacement we can try if she is still wanting to be on estrogen.  She may not even need estrogen any longer. Since this ring would not have been releasing hormone for at least 9 months. I would recommend she remove it and not use hormone replacement any longer. If her symptoms return we can always restart a different format if she desires.

## 2018-12-24 NOTE — Telephone Encounter (Signed)
Patient has not received ESTRING from [facility]. Please check on Rx. Patient has also decided she wants to get Proalia injections.

## 2018-12-26 ENCOUNTER — Other Ambulatory Visit (HOSPITAL_COMMUNITY)
Admission: RE | Admit: 2018-12-26 | Discharge: 2018-12-26 | Disposition: A | Discharge status: Home / Self Care | Payer: Medicare Other | Source: Ambulatory Visit | Attending: Family Medicine | Admitting: Family Medicine

## 2018-12-26 ENCOUNTER — Other Ambulatory Visit: Payer: Self-pay

## 2018-12-26 ENCOUNTER — Ambulatory Visit (INDEPENDENT_AMBULATORY_CARE_PROVIDER_SITE_OTHER): Payer: Medicare Other | Admitting: Family Medicine

## 2018-12-26 ENCOUNTER — Encounter: Payer: Self-pay | Admitting: Family Medicine

## 2018-12-26 VITALS — BP 101/68 | HR 77 | Temp 98.4°F | Resp 16 | Ht 66.0 in | Wt 106.5 lb

## 2018-12-26 DIAGNOSIS — E559 Vitamin D deficiency, unspecified: Secondary | ICD-10-CM | POA: Diagnosis not present

## 2018-12-26 DIAGNOSIS — R42 Dizziness and giddiness: Secondary | ICD-10-CM

## 2018-12-26 DIAGNOSIS — T199XXA Foreign body in genitourinary tract, part unspecified, initial encounter: Secondary | ICD-10-CM

## 2018-12-26 DIAGNOSIS — N898 Other specified noninflammatory disorders of vagina: Secondary | ICD-10-CM

## 2018-12-26 DIAGNOSIS — E785 Hyperlipidemia, unspecified: Secondary | ICD-10-CM | POA: Diagnosis not present

## 2018-12-26 DIAGNOSIS — K219 Gastro-esophageal reflux disease without esophagitis: Secondary | ICD-10-CM | POA: Diagnosis not present

## 2018-12-26 DIAGNOSIS — N952 Postmenopausal atrophic vaginitis: Secondary | ICD-10-CM

## 2018-12-26 DIAGNOSIS — M810 Age-related osteoporosis without current pathological fracture: Secondary | ICD-10-CM | POA: Diagnosis not present

## 2018-12-26 DIAGNOSIS — Z1231 Encounter for screening mammogram for malignant neoplasm of breast: Secondary | ICD-10-CM

## 2018-12-26 DIAGNOSIS — Z79890 Hormone replacement therapy (postmenopausal): Secondary | ICD-10-CM | POA: Diagnosis not present

## 2018-12-26 DIAGNOSIS — N8110 Cystocele, unspecified: Secondary | ICD-10-CM | POA: Insufficient documentation

## 2018-12-26 LAB — BASIC METABOLIC PANEL
BUN: 13 mg/dL (ref 6–23)
CO2: 28 mEq/L (ref 19–32)
Calcium: 9.1 mg/dL (ref 8.4–10.5)
Chloride: 101 mEq/L (ref 96–112)
Creatinine, Ser: 0.6 mg/dL (ref 0.40–1.20)
GFR: 97.95 mL/min (ref >60.00)
Glucose, Bld: 99 mg/dL (ref 70–99)
Potassium: 4.4 mEq/L (ref 3.5–5.1)
Sodium: 140 mEq/L (ref 135–145)

## 2018-12-26 LAB — CBC WITH DIFFERENTIAL/PLATELET
Basophils Absolute: 0 10*3/uL (ref 0.0–0.1)
Basophils Relative: 1.1 % (ref 0.0–3.0)
Eosinophils Absolute: 0.1 10*3/uL (ref 0.0–0.7)
Eosinophils Relative: 2.1 % (ref 0.0–5.0)
HCT: 38.8 % (ref 36.0–46.0)
Hemoglobin: 12.8 g/dL (ref 12.0–15.0)
Lymphocytes Relative: 23.1 % (ref 12.0–46.0)
Lymphs Abs: 1 10*3/uL (ref 0.7–4.0)
MCHC: 33.1 g/dL (ref 30.0–36.0)
MCV: 95.6 fl (ref 78.0–100.0)
Monocytes Absolute: 0.3 10*3/uL (ref 0.1–1.0)
Monocytes Relative: 6.1 % (ref 3.0–12.0)
Neutro Abs: 3 10*3/uL (ref 1.4–7.7)
Neutrophils Relative %: 67.6 % (ref 43.0–77.0)
Platelets: 250 10*3/uL (ref 150.0–400.0)
RBC: 4.06 Mil/uL (ref 3.87–5.11)
RDW: 13.9 % (ref 11.5–15.5)
WBC: 4.4 10*3/uL (ref 4.0–10.5)

## 2018-12-26 LAB — LIPID PANEL
Cholesterol: 259 mg/dL — ABNORMAL HIGH (ref 0–200)
HDL: 103.5 mg/dL (ref >39.00)
LDL Cholesterol: 144 mg/dL — ABNORMAL HIGH (ref 0–99)
NonHDL: 155.71
Total CHOL/HDL Ratio: 3
Triglycerides: 61 mg/dL (ref 0.0–149.0)
VLDL: 12.2 mg/dL (ref 0.0–40.0)

## 2018-12-26 LAB — VITAMIN D 25 HYDROXY (VIT D DEFICIENCY, FRACTURES): VITD: 29.25 ng/mL — ABNORMAL LOW (ref 30.00–100.00)

## 2018-12-26 MED ORDER — OMEPRAZOLE 20 MG PO CPDR: 20.0000 mg | DELAYED_RELEASE_CAPSULE | Freq: Every day | ORAL | 3 refills | Status: DC

## 2018-12-26 NOTE — Progress Notes (Signed)
Kelsey [name] , 11-23-1945, 73 y.o., female MRN: [id number] Patient Care Team    Relationship Specialty Notifications Start End  [name], DO PCP - General Family Medicine  05/27/15   [name], MD Consulting Physician Gastroenterology  12/08/15   Calvert Cantor, MD Consulting Physician Ophthalmology  12/08/15   Lyndal Pulley, DO Consulting Physician Family Medicine  04/07/18     Chief Complaint  Patient presents with  . Gastroesophageal Reflux    Refills on medications. Pt has complaints about vertigo when she repositions head or gets up in the middle of the night when she goes to the bathroom.   Marland Kitchen Hormone replacement    Pt has not replaced her E string since last MD appt.      Subjective: Kelsey [name] is a 73 y.o.  Gastroesophageal reflux disease without esophagitis Patient reports her reflux is controlled when she takes her PPI daily.  Osteoporosis without current pathological fracture, unspecified osteoporosis type/Vitamin D deficiency Had been on prolia and stopped.  Also not supplementing with calcium and vit d. dexa 12/05/2017 reviewed. -2.7 right femoral neck. Rpt 2 years.   Atrophic vaginitis/Hormone replacement therapy She has been prescribed estring 2 mg vaginal ring q 3 mos years.  However over the last year she has not removed the Estring placed by this provider on 11/2017.  She has not removed her Estring since 11/2017. Pelvic UTD 11/2017.   Dyslipidemia/Anemia, unspecified type Very active, healthy eater, asymptomatic anemia in the past. Taking fish oil.   Dizziness:  Patient reports dizziness when standing up from a seated position or bending forward.  She denies any room moving-like symptoms she just feels dizzy and off balance.  She has not had any falls.  She does not drink water throughout the day.  Depression screen [facility] 2/9 04/07/2018 03/05/2018 11/06/2017 12/17/2016 12/07/2016  Decreased Interest 0 0 0 0 0  Down, Depressed, Hopeless 0 0 0 0 0   PHQ - 2 Score 0 0 0 0 0    Allergies  Allergen Reactions  . Penicillins Rash    Has patient had a PCN reaction causing immediate rash, facial/tongue/throat swelling, SOB or lightheadedness with hypotension: no Has patient had a PCN reaction causing severe rash involving mucus membranes or skin necrosis: yes Has patient had a PCN reaction that required hospitalization: no Has patient had a PCN reaction occurring within the last 10 years: no If all of the above answers are "NO", then may proceed with Cephalosporin use.   . Sulfa Antibiotics Rash    Under arm and thighs   Social History   Social History Narrative  . Not on file   Past Medical History:  Diagnosis Date  . Allergy   . Anemia    iron deficiency  . 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 Bladder prolapse, female, acquired   . Chicken pox as a child  . Dysuria 09/13/2011  . Female bladder prolapse 09/13/2011  . Fibrocystic breast 09/14/2011  . GERD (gastroesophageal reflux disease)   . H/O measles   . H/O mumps   . Hyperlipidemia   . 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 Osteoarthrosis    right shoulder  . Osteoporosis    reclast in Oct  . Ovarian cyst 09/14/2011  . [facility] spotted fever) 2017  . Uterine fibroid 11/2012   See Dr. Cletis Media note, surgery 11/2012  . Vaginitis 09/13/2011   Past Surgical History:  Procedure Laterality Date  . DILATATION & CURRETTAGE/HYSTEROSCOPY  WITH RESECTOCOPE N/A 11/06/2012   Procedure: DILATATION & CURETTAGE/HYSTEROSCOPY WITH RESECTOCOPE;  Surgeon: Alwyn Pea, MD;  Location: [facility];  Service: Gynecology;  Laterality: N/A;  . HERNIA REPAIR  1992  . small intestine hernia  1994  . WISDOM TOOTH EXTRACTION     Family History  Problem Relation Age of Onset  . Heart disease Mother   . Osteoporosis Mother   . Cancer Father        prostate  . Osteoporosis Sister   . Gout Brother   . Gout Maternal Grandmother   . Heart disease Maternal Grandmother   . Alzheimer's disease Maternal  Grandfather   . Osteoporosis Sister    Allergies as of 12/26/2018      Reactions   Penicillins Rash   Has patient had a PCN reaction causing immediate rash, facial/tongue/throat swelling, SOB or lightheadedness with hypotension: no Has patient had a PCN reaction causing severe rash involving mucus membranes or skin necrosis: yes Has patient had a PCN reaction that required hospitalization: no Has patient had a PCN reaction occurring within the last 10 years: no If all of the above answers are "NO", then may proceed with Cephalosporin use.   Sulfa Antibiotics Rash   Under arm and thighs      Medication List       Accurate as of December 26, 2018  4:28 PM. If you have any questions, ask your nurse or doctor.        STOP taking these medications   baclofen 10 MG tablet Commonly known as: LIORESAL Stopped by: Howard Pouch, DO   denosumab 60 MG/ML Sosy injection Commonly known as: PROLIA Stopped by: Howard Pouch, DO   Estring 2 MG vaginal ring Generic drug: estradiol Stopped by: Howard Pouch, DO   meloxicam 15 MG tablet Commonly known as: MOBIC Stopped by: Howard Pouch, DO     TAKE these medications   Biotin 10 MG Tabs Take 1 tablet by mouth daily.   CALCIUM + D3 PO Take 600 mg by mouth daily.   Fish Oil 1000 MG Caps Take 1 capsule by mouth daily.   Glucosamine 750 MG Tabs Take 1,200 mg by mouth daily.   MAGNESIA PO Take 1 tablet by mouth daily. OTC   omeprazole 20 MG capsule Commonly known as: PRILOSEC Take 1 capsule (20 mg total) by mouth daily.   Vitamin D-3 25 MCG (1000 UT) Caps Take 5,000 Units by mouth.       All past medical history, surgical history, allergies, family history, immunizations andmedications were updated in the EMR today and reviewed under the history and medication portions of their EMR.     ROS: Negative, with the exception of above mentioned in HPI   Objective:  BP 101/68 (BP Location: Right Arm, Patient Position: Sitting, Cuff  Size: Normal)   Pulse 77   Temp 98.4 F (36.9 C) (Temporal)   Resp 16   Ht 5\' 6"  (1.676 m)   Wt 106 lb 8 oz (48.3 kg)   SpO2 100%   BMI 17.19 kg/m  Body mass index is 17.19 kg/m. Gen: Afebrile. No acute distress. Nontoxic in appearance, well developed, well nourished.  HENT: AT. [facility].  MMM Eyes:Pupils Equal Round Reactive to light, Extraocular movements intact,  Conjunctiva without redness, discharge or icterus. CV: RRR  Chest: CTAB, no wheeze or crackles. Good air movement, normal resp effort.  Abd: Soft. NTND.  Neuro:  Normal gait. PERLA. EOMi. Alert. Oriented x3 . Psych: Normal affect,  dress and demeanor. Normal speech. Normal thought content and judgment. GYN:  External genitalia within normal limits, normal hair distribution, no lesions. Urethral meatus normal, no lesions. Vaginal mucosa pink, moist, normal rugae, no lesions. moderate white thick discharge.  No bladder/suprapubic fullness, masses or tenderness. No cervical motion tenderness. No adnexal fullness. Anus and perineum within normal limits, no lesions.  Estring identified grasped with ring forcep and easily removed from vaginal vault.  Patient tolerated well.   No exam data present No results found. Results for orders placed or performed in visit on 12/26/18 (from the past 24 hour(s))  Lipid panel     Status: Abnormal   Collection Time: 12/26/18 10:10 AM  Result Value Ref Range   Cholesterol 259 (H) 0 - 200 mg/dL   Triglycerides 61.0 0.0 - 149.0 mg/dL   HDL 103.50 >39.00 mg/dL   VLDL 12.2 0.0 - 40.0 mg/dL   LDL Cholesterol 144 (H) 0 - 99 mg/dL   Total CHOL/HDL Ratio 3    NonHDL 155.71   Vitamin D (25 hydroxy)     Status: Abnormal   Collection Time: 12/26/18 10:10 AM  Result Value Ref Range   VITD 29.25 (L) 30.00 - 100.00 ng/mL  CBC w/Diff     Status: None   Collection Time: 12/26/18 10:10 AM  Result Value Ref Range   WBC 4.4 4.0 - 10.5 K/uL   RBC 4.06 3.87 - 5.11 Mil/uL   Hemoglobin 12.8 12.0 - 15.0 g/dL    HCT 38.8 36.0 - 46.0 %   MCV 95.6 78.0 - 100.0 fl   MCHC 33.1 30.0 - 36.0 g/dL   RDW 13.9 11.5 - 15.5 %   Platelets 250.0 150.0 - 400.0 K/uL   Neutrophils Relative % 67.6 43.0 - 77.0 %   Lymphocytes Relative 23.1 12.0 - 46.0 %   Monocytes Relative 6.1 3.0 - 12.0 %   Eosinophils Relative 2.1 0.0 - 5.0 %   Basophils Relative 1.1 0.0 - 3.0 %   Neutro Abs 3.0 1.4 - 7.7 K/uL   Lymphs Abs 1.0 0.7 - 4.0 K/uL   Monocytes Absolute 0.3 0.1 - 1.0 K/uL   Eosinophils Absolute 0.1 0.0 - 0.7 K/uL   Basophils Absolute 0.0 0.0 - 0.1 K/uL  Basic Metabolic Panel (BMET)     Status: None   Collection Time: 12/26/18 10:10 AM  Result Value Ref Range   Sodium 140 135 - 145 mEq/L   Potassium 4.4 3.5 - 5.1 mEq/L   Chloride 101 96 - 112 mEq/L   CO2 28 19 - 32 mEq/L   Glucose, Bld 99 70 - 99 mg/dL   BUN 13 6 - 23 mg/dL   Creatinine, Ser 0.60 0.40 - 1.20 mg/dL   Calcium 9.1 8.4 - 10.5 mg/dL   GFR 97.95 >60.00 mL/min    Assessment/Plan: Kelsey [name] is a 73 y.o. female present for OV for  Gastroesophageal reflux disease without esophagitis -Stable.  Refills provided today on omeprazole - omeprazole (PRILOSEC) 20 MG capsule; Take 1 capsule (20 mg total) by mouth daily.  Dispense: 90 capsule; Refill: 3  Dyslipidemia Continue dietary modifications and exercise regimen. - Lipid panel -reStart fish oil supplementation  Osteoporosis without current pathological fracture, unspecified osteoporosis type/Vitamin D deficiency -Encouraged patient to restart her calcium and vitamin D supplementations. -We will work on getting Prolia approved for her again to restart prolia every 6 months injections and inform her as soon as we get approval. - Vitamin D (25 hydroxy)  Breast cancer  screening by mammogram - MM 3D SCREEN BREAST BILATERAL  Dizziness Her dizziness seems to be postural.  Discussed reasons for dizziness such as volume loss or dehydration.  And she states she does not drink hardly any water in a  day.  I encouraged her to drink 80 ounces a day and we will collect labs today to rule out other potential causes of anemia or electrolyte imbalances. - CBC w/Diff - Basic Metabolic Panel (BMET)  Vaginal discharge Elected to treat with Diflucan, however patient wanted to wait till results return. - Cervicovaginal ancillary only( [place])  Foreign body GU tract, initial encounter/Hormone replacement therapy/Atrophic vaginitis Removed vaginal hormonal ring for her today.  Do not recommend she continue since she either does not remember or cannot seem to remove the ring on her own.  She has had same ring in place for over 1 year without complaining of vaginal symptoms.  Therefore do not believe she needs the hormone replacement any longer.   Reviewed expectations re: course of current medical issues.  Discussed self-management of symptoms.  Outlined signs and symptoms indicating need for more acute intervention.  Patient verbalized understanding and all questions were answered.  Patient received an After-Visit Summary.   Greater than 40 minutes spent with patient, >50% of time spent face to face     Orders Placed This Encounter  Procedures  . MM 3D SCREEN BREAST BILATERAL  . Lipid panel  . Vitamin D (25 hydroxy)  . CBC w/Diff  . Basic Metabolic Panel (BMET)     Note is dictated utilizing voice recognition software. Although note has been proof read prior to signing, occasional typographical errors still can be missed. If any questions arise, please do not hesitate to call for verification.   electronically signed by:  Howard Pouch, DO  [place]

## 2018-12-26 NOTE — Patient Instructions (Signed)
Your E-ring was removed today. I do not recommend you continue estrogen.  We will  Call you with the results to your pelvic exam. I have called in  A medication called diflucan to take - its just one tab. This treats yeast.   I have ordered you mammogram and refilled your omeprazole.  We will call you about your prolia once we know if your insurance approves.    Please help [place] help you:  We are honored you have chosen [facility] for your Primary Care home. Below you will find basic instructions that you may need to access in the future. Please help [place] help you by reading the instructions, which cover many of the frequent questions we experience.   Prescription refills and request:  -In order to allow more efficient response time, please call your pharmacy for all refills. They will forward the request electronically to [place]. This allows for the quickest possible response. Request left on a nurse line can take longer to refill, since these are checked as time allows between office patients and other phone calls.  - refill request can take up to 3-5 working days to complete.  - If request is sent electronically and request is appropiate, it is usually completed in 1-2 business days.  - all patients will need to be seen routinely for all chronic medical conditions requiring prescription medications (see follow-up below). If you are overdue for follow up on your condition, you will be asked to make an appointment and we will call in enough medication to cover you until your appointment (up to 30 days).  - all controlled substances will require a face to face visit to request/refill.  - if you desire your prescriptions to go through a new pharmacy, and have an active script at original pharmacy, you will need to call your pharmacy and have scripts transferred to new pharmacy. This is completed between the pharmacy locations and not by your provider.    Results: If any images or labs were ordered,  it can take up to 1 week to get results depending on the test ordered and the lab/facility running and resulting the test. - Normal or stable results, which do not need further discussion, may be released to your mychart immediately with attached note to you. A call may not be generated for normal results. Please make certain to sign up for mychart. If you have questions on how to activate your mychart you can call the front office.  - If your results need further discussion, our office will attempt to contact you via phone, and if unable to reach you after 2 attempts, we will release your abnormal result to your mychart with instructions.  - All results will be automatically released in mychart after 1 week.  - Your provider will provide you with explanation and instruction on all relevant material in your results. Please keep in mind, results and labs may appear confusing or abnormal to the untrained eye, but it does not mean they are actually abnormal for you personally. If you have any questions about your results that are not covered, or you desire more detailed explanation than what was provided, you should make an appointment with your provider to do so.   Our office handles many outgoing and incoming calls daily. If we have not contacted you within 1 week about your results, please check your mychart to see if there is a message first and if not, then contact our office.  In helping with this matter, you help decrease call volume, and therefore allow [place] to be able to respond to patients needs more efficiently.   Acute office visits (sick visit):  An acute visit is intended for a new problem and are scheduled in shorter time slots to allow schedule openings for patients with new problems. This is the appropriate visit to discuss a new problem. Problems will not be addressed by phone call or Echart message. Appointment is needed if requesting treatment. In order to provide you with excellent quality  medical care with proper time for you to explain your problem, have an exam and receive treatment with instructions, these appointments should be limited to one new problem per visit. If you experience a new problem, in which you desire to be addressed, please make an acute office visit, we save openings on the schedule to accommodate you. Please do not save your new problem for any other type of visit, let us take care of it properly and quickly for you.   Follow up visits:  Depending on your condition(s) your provider will need to see you routinely in order to provide you with quality care and prescribe medication(s). Most chronic conditions (Example: hypertension, Diabetes, depression/anxiety... etc), require visits a couple times a year. Your provider will instruct you on proper follow up for your personal medical conditions and history. Please make certain to make follow up appointments for your condition as instructed. Failing to do so could result in lapse in your medication treatment/refills. If you request a refill, and are overdue to be seen on a condition, we will always provide you with a 30 day script (once) to allow you time to schedule.    Medicare wellness (well visit): - we have a wonderful Nurse Maudie Mercury), that will meet with you and provide you will yearly medicare wellness visits. These visits should occur yearly (can not be scheduled less than 1 calendar year apart) and cover preventive health, immunizations, advance directives and screenings you are entitled to yearly through your medicare benefits. Do not miss out on your entitled benefits, this is when medicare will pay for these benefits to be ordered for you.  These are strongly encouraged by your provider and is the appropriate type of visit to make certain you are up to date with all preventive health benefits. If you have not had your medicare wellness exam in the last 12 months, please make certain to schedule one by calling the  office and schedule your medicare wellness with Maudie Mercury as soon as possible.   Yearly physical (well visit):  - Adults are recommended to be seen yearly for physicals. Check with your insurance and date of your last physical, most insurances require one calendar year between physicals. Physicals include all preventive health topics, screenings, medical exam and labs that are appropriate for gender/age and history. You may have fasting labs needed at this visit. This is a well visit (not a sick visit), new problems should not be covered during this visit (see acute visit).  - Pediatric patients are seen more frequently when they are younger. Your provider will advise you on well child visit timing that is appropriate for your their age. - This is not a medicare wellness visit. Medicare wellness exams do not have an exam portion to the visit. Some medicare companies allow for a physical, some do not allow a yearly physical. If your medicare allows a yearly physical you can schedule the medicare wellness with our nurse Maudie Mercury  and have your physical with your provider after, on the same day. Please check with insurance for your full benefits.   Late Policy/No Shows:  - all new patients should arrive 15-30 minutes earlier than appointment to allow [place] time  to  obtain all personal demographics,  insurance information and for you to complete office paperwork. - All established patients should arrive 10-15 minutes earlier than appointment time to update all information and be checked in .  - In our best efforts to run on time, if you are late for your appointment you will be asked to either reschedule or if able, we will work you back into the schedule. There will be a wait time to work you back in the schedule,  depending on availability.  - If you are unable to make it to your appointment as scheduled, please call 24 hours ahead of time to allow [place] to fill the time slot with someone else who needs to be seen. If you  do not cancel your appointment ahead of time, you may be charged a no show fee.

## 2018-12-26 NOTE — Telephone Encounter (Signed)
Pt has appt 12/26/2018

## 2018-12-29 LAB — CERVICOVAGINAL ANCILLARY ONLY
Bacterial vaginitis: POSITIVE — AB
Candida vaginitis: POSITIVE — AB

## 2018-12-30 ENCOUNTER — Encounter: Payer: Self-pay | Admitting: Family Medicine

## 2018-12-30 ENCOUNTER — Ambulatory Visit (INDEPENDENT_AMBULATORY_CARE_PROVIDER_SITE_OTHER): Payer: Medicare Other | Admitting: Family Medicine

## 2018-12-30 ENCOUNTER — Other Ambulatory Visit: Payer: Self-pay

## 2018-12-30 VITALS — BP 104/58 | HR 85 | Temp 97.5°F | Resp 17 | Ht 66.0 in | Wt 107.2 lb

## 2018-12-30 DIAGNOSIS — B373 Candidiasis of vulva and vagina: Secondary | ICD-10-CM

## 2018-12-30 DIAGNOSIS — B3731 Acute candidiasis of vulva and vagina: Secondary | ICD-10-CM

## 2018-12-30 DIAGNOSIS — E559 Vitamin D deficiency, unspecified: Secondary | ICD-10-CM | POA: Diagnosis not present

## 2018-12-30 DIAGNOSIS — E785 Hyperlipidemia, unspecified: Secondary | ICD-10-CM

## 2018-12-30 DIAGNOSIS — B9689 Other specified bacterial agents as the cause of diseases classified elsewhere: Secondary | ICD-10-CM

## 2018-12-30 DIAGNOSIS — R21 Rash and other nonspecific skin eruption: Secondary | ICD-10-CM | POA: Diagnosis not present

## 2018-12-30 DIAGNOSIS — Z5320 Procedure and treatment not carried out because of patient's decision for unspecified reasons: Secondary | ICD-10-CM | POA: Diagnosis not present

## 2018-12-30 DIAGNOSIS — M810 Age-related osteoporosis without current pathological fracture: Secondary | ICD-10-CM

## 2018-12-30 DIAGNOSIS — N760 Acute vaginitis: Secondary | ICD-10-CM

## 2018-12-30 MED ORDER — METRONIDAZOLE 500 MG PO TABS: 500.0000 mg | ORAL_TABLET | Freq: Two times a day (BID) | ORAL | 0 refills | Status: DC

## 2018-12-30 MED ORDER — FLUCONAZOLE 150 MG PO TABS: ORAL_TABLET | ORAL | 0 refills | Status: DC

## 2018-12-30 MED ORDER — DENOSUMAB 60 MG/ML ~~LOC~~ SOSY
60.0000 mg | PREFILLED_SYRINGE | Freq: Once | SUBCUTANEOUS | Status: AC
  Administered 2018-12-30: 60 mg via SUBCUTANEOUS

## 2018-12-30 MED ORDER — DOXYCYCLINE HYCLATE 100 MG PO TABS: 100.0000 mg | ORAL_TABLET | Freq: Two times a day (BID) | ORAL | 0 refills | Status: DC

## 2018-12-30 NOTE — Patient Instructions (Addendum)
Follow up in 6 months for nurse visit - prolia injection  Start diflucan/fluconazole pill today for one dose- then take another 1 pill in 7 days>> treats yeast  Start flagyl/metronidazole every 12 hours for 7 days. >> treats bacteria  Use the mupirocin on your rash to help avoid infection . I have called in a couple days of antibiotics to take by mouth for 5 days.  But some benadryl GEL to help with itching.   Watch your cholesterol.

## 2018-12-30 NOTE — Progress Notes (Signed)
Kelsey [name] , [birth year], 73 y.o., female MRN: [id number] Patient Care Team    Relationship Specialty Notifications Start End  [name], DO PCP - General Family Medicine  05/27/15   [name], MD Consulting Physician Gastroenterology  12/08/15   Calvert Cantor, MD Consulting Physician Ophthalmology  12/08/15   Lyndal Pulley, DO Consulting Physician Family Medicine  04/07/18     Chief Complaint  Patient presents with  . Rash    Itchy Rash on collar bone and left arm that started 7-8 weeks ago. Pt was dx with shingles by another MD 7-8 weeks ago, she took the medication.      Subjective: Pt presents for an OV with complaints of rash  of 8 weeks duration.  Associated symptoms include itchiness.Pt has tried a few creams to ease their symptoms. She states she had a similar rash on the right side of her neck/shoulder and was told she had shingles about 8 weeks ago, She denies any pain. She states 2-3 days after she was told she had shingles she started to have a similar rash on the left shoulder and now on her right chest wall x3 and x2 on her left bicep. The x2 on her left bicep are mildly swollen and red. They are itchy.   Osteoporosis/vit d def: pt would like to restart prolia injections- insurance approved. Offered injection today.   Hyperlipidemia: 12/26/2018 CV risk score 12.9%.  Lipid Panel     Component Value Date/Time   CHOL 259 (H) 12/26/2018 1010   TRIG 61.0 12/26/2018 1010   HDL 103.50 12/26/2018 1010   CHOLHDL 3 12/26/2018 1010   VLDL 12.2 12/26/2018 1010   LDLCALC 144 (H) 12/26/2018 1010   LDLDIRECT 131.6 11/28/2012 0951   Vaginal swab- positive for BV and yeast.     Depression screen [facility] 2/9 12/26/2018 04/07/2018 03/05/2018 11/06/2017 12/17/2016  Decreased Interest 0 0 0 0 0  Down, Depressed, Hopeless 0 0 0 0 0  PHQ - 2 Score 0 0 0 0 0    Allergies  Allergen Reactions  . Penicillins Rash    Has patient had a PCN reaction causing immediate rash,  facial/tongue/throat swelling, SOB or lightheadedness with hypotension: no Has patient had a PCN reaction causing severe rash involving mucus membranes or skin necrosis: yes Has patient had a PCN reaction that required hospitalization: no Has patient had a PCN reaction occurring within the last 10 years: no If all of the above answers are "NO", then may proceed with Cephalosporin use.   . Sulfa Antibiotics Rash    Under arm and thighs   Social History   Social History Narrative  . Not on file   Past Medical History:  Diagnosis Date  . Allergy   . Anemia    iron deficiency  . 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 Bladder prolapse, female, acquired   . Chicken pox as a child  . Dysuria 09/13/2011  . Female bladder prolapse 09/13/2011  . Fibrocystic breast 09/14/2011  . GERD (gastroesophageal reflux disease)   . H/O measles   . H/O mumps   . Hyperlipidemia   . 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 Osteoarthrosis    right shoulder  . Osteoporosis    reclast in Oct  . Ovarian cyst 09/14/2011  . RMSF [facility] spotted fever) 2017  . Situational anxiety 03/19/2014   R/t grieving process & caregiver burden for husband who has dementia, alzheimer's.   Marland Kitchen Uterine fibroid 11/2012  See Dr. Cletis Media note, surgery 11/2012  . Vaginitis 09/13/2011   Past Surgical History:  Procedure Laterality Date  . DILATATION & CURRETTAGE/HYSTEROSCOPY WITH RESECTOCOPE N/A 11/06/2012   Procedure: DILATATION & CURETTAGE/HYSTEROSCOPY WITH RESECTOCOPE;  Surgeon: Alwyn Pea, MD;  Location: [facility];  Service: Gynecology;  Laterality: N/A;  . HERNIA REPAIR  1992  . small intestine hernia  1994  . WISDOM TOOTH EXTRACTION     Family History  Problem Relation Age of Onset  . Heart disease Mother   . Osteoporosis Mother   . Cancer Father        prostate  . Osteoporosis Sister   . Gout Brother   . Gout Maternal Grandmother   . Heart disease Maternal Grandmother   . Alzheimer's disease Maternal Grandfather   .  Osteoporosis Sister    Allergies as of 12/30/2018      Reactions   Penicillins Rash   Has patient had a PCN reaction causing immediate rash, facial/tongue/throat swelling, SOB or lightheadedness with hypotension: no Has patient had a PCN reaction causing severe rash involving mucus membranes or skin necrosis: yes Has patient had a PCN reaction that required hospitalization: no Has patient had a PCN reaction occurring within the last 10 years: no If all of the above answers are "NO", then may proceed with Cephalosporin use.   Sulfa Antibiotics Rash   Under arm and thighs      Medication List       Accurate as of December 30, 2018  1:16 PM. If you have any questions, ask your nurse or doctor.        STOP taking these medications   MAGNESIA PO Stopped by: Howard Pouch, DO     TAKE these medications   Biotin 10 MG Tabs Take 1 tablet by mouth daily.   CALCIUM + D3 PO Take 600 mg by mouth daily.   Fish Oil 1000 MG Caps Take 1 capsule by mouth daily.   Glucosamine 750 MG Tabs Take 1,200 mg by mouth daily.   omeprazole 20 MG capsule Commonly known as: PRILOSEC Take 1 capsule (20 mg total) by mouth daily.   Vitamin D-3 25 MCG (1000 UT) Caps Take 5,000 Units by mouth.       All past medical history, surgical history, allergies, family history, immunizations andmedications were updated in the EMR today and reviewed under the history and medication portions of their EMR.     ROS: Negative, with the exception of above mentioned in HPI   Objective:  BP (!) 104/58 (BP Location: Left Arm, Patient Position: Sitting, Cuff Size: Normal)   Pulse 85   Temp (!) 97.5 F (36.4 C) (Temporal)   Resp 17   Ht 5\' 6"  (1.676 m)   Wt 107 lb 4 oz (48.6 kg)   SpO2 97%   BMI 17.31 kg/m  Body mass index is 17.31 kg/m. Gen: Afebrile. No acute distress. Nontoxic in appearance, well developed, well nourished.  HENT: AT. [place]. MMM Eyes:Pupils Equal Round Reactive to light, Extraocular movements  intact,  Conjunctiva without redness, discharge or icterus. Neck/lymp/endocrine: Supple,no lymphadenopathy Skin: no rashes, purpura or petechiae. x5 insect bites x3 right chest wall and x2 left bicep. Bicep bites appear red and swollen- warm to touch. Neuro: Normal gait. PERLA. EOMi. Alert. Oriented x3  Psych: Normal affect, dress and demeanor. Normal speech. Normal thought content and judgment.  No exam data present No results found. No results found for this or any previous visit (from the past  24 hour(s)).  Assessment/Plan: CARLEIGH BUCCIERI is a 73 y.o. female present for OV for  Dyslipidemia - declined statin. She wants to work on avoiding high saturated foods and stopping the coconut oil she started.  - Her HDL is great. LDL elevated 144--> 12.5% CV risk.  - if still elevated in 6 mos she will start low dose statin.   Osteoporosis without current pathological fracture, unspecified osteoporosis type/Vitamin D deficiency - restart calcium and vit d - prolia provided today- f/u 6 mos   Bacterial vaginal infection/Vaginal yeast infection - discussed results.  - prescribed diflucan x2 and flagyl.   Rash/bug bite Current areas appear to be insect bites. Encouraged bug repellent when outside.  - bicep bites have been excoriated from her scratching and they appear infected. Bactroban ointment if can tolerate (she has been using at home- but allergic to sulphur orally) - doxy BID a 5 days.  - OTC benadryl gel encouraged for comfort.    Reviewed expectations re: course of current medical issues.  Discussed self-management of symptoms.  Outlined signs and symptoms indicating need for more acute intervention.  Patient verbalized understanding and all questions were answered.  Patient received an After-Visit Summary.  6 mos f/u with recheck on cholesterol and prolia injection.   No orders of the defined types were placed in this encounter.  > 15 minutes spent with patient, >  50% of that time face to face   Note is dictated utilizing voice recognition software. Although note has been proof read prior to signing, occasional typographical errors still can be missed. If any questions arise, please do not hesitate to call for verification.   electronically signed by:  Howard Pouch, DO  [place]

## 2019-01-02 ENCOUNTER — Ambulatory Visit: Payer: Medicare Other | Admitting: Family Medicine

## 2019-01-13 ENCOUNTER — Other Ambulatory Visit: Payer: Self-pay

## 2019-01-13 ENCOUNTER — Encounter: Payer: Self-pay | Admitting: Family Medicine

## 2019-01-13 ENCOUNTER — Ambulatory Visit (INDEPENDENT_AMBULATORY_CARE_PROVIDER_SITE_OTHER): Payer: Medicare Other | Admitting: Family Medicine

## 2019-01-13 VITALS — BP 96/60 | HR 83 | Ht 66.0 in | Wt 109.0 lb

## 2019-01-13 DIAGNOSIS — M545 Low back pain, unspecified: Secondary | ICD-10-CM

## 2019-01-13 DIAGNOSIS — M48061 Spinal stenosis, lumbar region without neurogenic claudication: Secondary | ICD-10-CM | POA: Diagnosis not present

## 2019-01-13 DIAGNOSIS — M999 Biomechanical lesion, unspecified: Secondary | ICD-10-CM

## 2019-01-13 NOTE — Progress Notes (Signed)
[name] [address] [place], [place] [address] Phone: [phone] Subjective:   I [name] am serving as a Education administrator for Dr. [name].    CC: Back pain follow-up  [id number]  Kelsey [name] is a 73 y.o. female coming in with complaint of back pain. States she is stiff everywhere. Hands have been bothering her due to arthritis. Blood pressure is low. Second reading was better than the first. Has been experiencing dizziness. Was told that she needs hydrate.  Patient states that at this time she does think she needs something more aggressive recently.  Has avoided different epidurals but is willing to consider it.     Past Medical History:  Diagnosis Date  . Allergy   . Anemia    iron deficiency  . 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 Bladder prolapse, female, acquired   . Chicken pox as a child  . Dysuria 09/13/2011  . Female bladder prolapse 09/13/2011  . Fibrocystic breast 09/14/2011  . GERD (gastroesophageal reflux disease)   . H/O measles   . H/O mumps   . Hyperlipidemia   . 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 Osteoarthrosis    right shoulder  . Osteoporosis    reclast in Oct  . Ovarian cyst 09/14/2011  . [facility] spotted fever) 2017  . Situational anxiety 03/19/2014   R/t grieving process & caregiver burden for husband who has dementia, alzheimer's.   Marland Kitchen Uterine fibroid 11/2012   See Dr. Cletis Media note, surgery 11/2012  . Vaginitis 09/13/2011   Past Surgical History:  Procedure Laterality Date  . DILATATION & CURRETTAGE/HYSTEROSCOPY WITH RESECTOCOPE N/A 11/06/2012   Procedure: DILATATION & CURETTAGE/HYSTEROSCOPY WITH RESECTOCOPE;  Surgeon: Alwyn Pea, MD;  Location: [facility];  Service: Gynecology;  Laterality: N/A;  . HERNIA REPAIR  1992  . small intestine hernia  1994  . WISDOM TOOTH EXTRACTION     Social History   Socioeconomic History  . Marital status: Married    Spouse name: Not on file  . Number of children: Not on  file  . Years of education: Not on file  . Highest education level: Not on file  Occupational History  . Not on file  Social Needs  . Financial resource strain: Not on file  . Food insecurity    Worry: Not on file    Inability: Not on file  . Transportation needs    Medical: Not on file    Non-medical: Not on file  Tobacco Use  . Smoking status: Former Smoker    Packs/day: 0.30    Years: 10.00    Pack years: 3.00    Types: Cigarettes    Quit date: 07/09/1978    Years since quitting: 40.5  . Smokeless tobacco: Never Used  Substance and Sexual Activity  . Alcohol use: Yes    Comment: 1-2 glasses of wine daily and a 12 oz beer daily  . Drug use: No  . Sexual activity: Never  Lifestyle  . Physical activity    Days per week: Not on file    Minutes per session: Not on file  . Stress: Not on file  Relationships  . Social Herbalist on phone: Not on file    Gets together: Not on file    Attends religious service: Not on file    Active member of club or organization: Not on file    Attends meetings of clubs or organizations: Not on file  Relationship status: Not on file  Other Topics Concern  . Not on file  Social History Narrative  . Not on file   Allergies  Allergen Reactions  . Penicillins Rash    Has patient had a PCN reaction causing immediate rash, facial/tongue/throat swelling, SOB or lightheadedness with hypotension: no Has patient had a PCN reaction causing severe rash involving mucus membranes or skin necrosis: yes Has patient had a PCN reaction that required hospitalization: no Has patient had a PCN reaction occurring within the last 10 years: no If all of the above answers are "NO", then may proceed with Cephalosporin use.   . Sulfa Antibiotics Rash    Under arm and thighs   Family History  Problem Relation Age of Onset  . Heart disease Mother   . Osteoporosis Mother   . Cancer Father        prostate  . Osteoporosis Sister   . Gout Brother    . Gout Maternal Grandmother   . Heart disease Maternal Grandmother   . Alzheimer's disease Maternal Grandfather   . Osteoporosis Sister          Current Outpatient Medications (Other):  .  Biotin 10 MG TABS, Take 1 tablet by mouth daily.  .  Calcium Carb-Cholecalciferol (CALCIUM + D3 PO), Take 600 mg by mouth daily.  .  Cholecalciferol (VITAMIN D-3) 1000 units CAPS, Take 5,000 Units by mouth.  .  doxycycline (VIBRA-TABS) 100 MG tablet, Take 1 tablet (100 mg total) by mouth 2 (two) times daily. .  fluconazole (DIFLUCAN) 150 MG tablet, 1 tab PO today, rpt dose in 1 week .  Glucosamine 750 MG TABS, Take 1,200 mg by mouth daily.  .  metroNIDAZOLE (FLAGYL) 500 MG tablet, Take 1 tablet (500 mg total) by mouth 2 (two) times daily. .  Omega-3 Fatty Acids (FISH OIL) 1000 MG CAPS, Take 1 capsule by mouth daily. Marland Kitchen  omeprazole (PRILOSEC) 20 MG capsule, Take 1 capsule (20 mg total) by mouth daily.    Past medical history, social, surgical and family history all reviewed in electronic medical record.  No pertanent information unless stated regarding to the chief complaint.   Review of Systems:  No headache, visual changes, nausea, vomiting, diarrhea, constipation, dizziness, abdominal pain, skin rash, fevers, chills, night sweats, weight loss, swollen lymph nodes, body aches, joint swelling, chest pain, shortness of breath, mood changes.  Positive muscle aches  Objective  Blood pressure 96/60, pulse 83, height 5\' 6"  (1.676 m), weight 109 lb (49.4 kg), SpO2 97 %.    General: No apparent distress alert and oriented x3 mood and affect normal, dressed appropriately.  HEENT: Pupils equal, extraocular movements intact  Respiratory: Patient's speak in full sentences and does not appear short of breath  Cardiovascular: No lower extremity edema, non tender, no erythema  Skin: Warm dry intact with no signs of infection or rash on extremities or on axial skeleton.  Abdomen: Soft nontender  Neuro:  Cranial nerves II through XII are intact, neurovascularly intact in all extremities with 2+ DTRs and 2+ pulses.  Lymph: No lymphadenopathy of posterior or anterior cervical chain or axillae bilaterally.  Gait normal with good balance and coordination.  MSK:  Non tender with full range of motion and good stability and symmetric strength and tone of shoulders, elbows, wrist, hip, knee and ankles bilaterally.  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  Patient has significant tightness with forward flexion of 20 degrees, extension to 5 degrees.  Pain to palpation paraspinal musculature.  Osteopathic findings T6 extended rotated and side bent left L4 flexed rotated and side bent left Sacrum right on right    Impression and Recommendations:     This case required medical decision making of moderate complexity. The above documentation has been reviewed and is accurate and complete Lyndal Pulley, DO       Note: This dictation was prepared with Dragon dictation along with smaller phrase technology. Any transcriptional errors that result from this process are unintentional.

## 2019-01-13 NOTE — Patient Instructions (Signed)
CoQ 10 200 with new statin Glove liners at night Arnica lotion 2 times a day for hands See you 2 weeks after epidural

## 2019-01-13 NOTE — Assessment & Plan Note (Signed)
Severe overall.  Discussed with patient in great length.  Discussed icing regimen and home exercise.  I do believe that an epidural is necessary at this time and will be sent for an L4-L5 epidural.  Patient warned of potential complications but I do think patient will do well.  Attempted osteopathic manipulation again.  Continue with the vitamin supplementation, topical anti-inflammatories.  Patient has declined any other type of medications at the time.  Follow-up again in 3 weeks after epidural

## 2019-01-13 NOTE — Assessment & Plan Note (Signed)
Decision today to treat with OMT was based on Physical Exam  After verbal consent patient was treated with  ME, FPR techniques in  thoracic, lumbar and sacral areas  Patient tolerated the procedure well with improvement in symptoms  Patient given exercises, stretches and lifestyle modifications  See medications in patient instructions if given  Patient will follow up in 4-6 weeks

## 2019-02-03 ENCOUNTER — Ambulatory Visit
Admission: RE | Admit: 2019-02-03 | Discharge: 2019-02-03 | Disposition: A | Discharge status: Home / Self Care | Payer: Medicare Other | Source: Ambulatory Visit | Attending: Family Medicine | Admitting: Family Medicine

## 2019-02-03 ENCOUNTER — Other Ambulatory Visit: Payer: Self-pay

## 2019-02-03 DIAGNOSIS — M545 Low back pain, unspecified: Secondary | ICD-10-CM

## 2019-02-03 MED ORDER — IOPAMIDOL (ISOVUE-M 300) INJECTION 61%
1.0000 mL | Freq: Once | INTRAMUSCULAR | Status: AC | PRN
  Administered 2019-02-03: 1 mL via EPIDURAL

## 2019-02-03 MED ORDER — METHYLPREDNISOLONE ACETATE 40 MG/ML INJ SUSP (RADIOLOG
120.0000 mg | Freq: Once | INTRAMUSCULAR | Status: AC
  Administered 2019-02-03: 120 mg via EPIDURAL

## 2019-02-03 NOTE — Discharge Instructions (Signed)

## 2019-03-10 DIAGNOSIS — H02831 Dermatochalasis of right upper eyelid: Secondary | ICD-10-CM | POA: Diagnosis not present

## 2019-03-10 DIAGNOSIS — H02834 Dermatochalasis of left upper eyelid: Secondary | ICD-10-CM | POA: Diagnosis not present

## 2019-03-10 DIAGNOSIS — H11153 Pinguecula, bilateral: Secondary | ICD-10-CM | POA: Diagnosis not present

## 2019-03-10 DIAGNOSIS — H52223 Regular astigmatism, bilateral: Secondary | ICD-10-CM | POA: Diagnosis not present

## 2019-03-10 DIAGNOSIS — H524 Presbyopia: Secondary | ICD-10-CM | POA: Diagnosis not present

## 2019-03-10 DIAGNOSIS — H25813 Combined forms of age-related cataract, bilateral: Secondary | ICD-10-CM | POA: Diagnosis not present

## 2019-03-11 ENCOUNTER — Ambulatory Visit (INDEPENDENT_AMBULATORY_CARE_PROVIDER_SITE_OTHER): Payer: Medicare Other | Admitting: Family Medicine

## 2019-03-11 ENCOUNTER — Other Ambulatory Visit: Payer: Self-pay

## 2019-03-11 ENCOUNTER — Encounter: Payer: Self-pay | Admitting: Family Medicine

## 2019-03-11 DIAGNOSIS — M999 Biomechanical lesion, unspecified: Secondary | ICD-10-CM | POA: Diagnosis not present

## 2019-03-11 DIAGNOSIS — M48061 Spinal stenosis, lumbar region without neurogenic claudication: Secondary | ICD-10-CM

## 2019-03-11 NOTE — Progress Notes (Signed)
[name] [address], [place] [address] Phone: [phone] Subjective:   I [name] am serving as a Education administrator for Dr. [name].    CC: Back pain follow-up  [id number]  Kelsey [name] is a 73 y.o. female coming in with complaint of back pain.  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 Believes the epidural helped.  Patient states that she is able to get up and down out of the chair on a more regular basis.  Patient feels like making significant progress.  Patient states that the pain is significantly less than it was previously.      Past Medical History:  Diagnosis Date  . Allergy   . Anemia    iron deficiency  . 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 Bladder prolapse, female, acquired   . Chicken pox as a child  . Dysuria 09/13/2011  . Female bladder prolapse 09/13/2011  . Fibrocystic breast 09/14/2011  . GERD (gastroesophageal reflux disease)   . H/O measles   . H/O mumps   . Hyperlipidemia   . 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 Osteoarthrosis    right shoulder  . Osteoporosis    reclast in Oct  . Ovarian cyst 09/14/2011  . [facility] spotted fever) 2017  . Situational anxiety 03/19/2014   R/t grieving process & caregiver burden for husband who has dementia, alzheimer's.   Marland Kitchen Uterine fibroid 11/2012   See Dr. Cletis Media note, surgery 11/2012  . Vaginitis 09/13/2011   Past Surgical History:  Procedure Laterality Date  . DILATATION & CURRETTAGE/HYSTEROSCOPY WITH RESECTOCOPE N/A 11/06/2012   Procedure: DILATATION & CURETTAGE/HYSTEROSCOPY WITH RESECTOCOPE;  Surgeon: Alwyn Pea, MD;  Location: [facility];  Service: Gynecology;  Laterality: N/A;  . HERNIA REPAIR  1992  . small intestine hernia  1994  . WISDOM TOOTH EXTRACTION     Social History   Socioeconomic History  . Marital status: Married    Spouse name: Not on file  . Number of children: Not on file  . Years of education: Not on file  .  Highest education level: Not on file  Occupational History  . Not on file  Social Needs  . Financial resource strain: Not on file  . Food insecurity    Worry: Not on file    Inability: Not on file  . Transportation needs    Medical: Not on file    Non-medical: Not on file  Tobacco Use  . Smoking status: Former Smoker    Packs/day: 0.30    Years: 10.00    Pack years: 3.00    Types: Cigarettes    Quit date: 07/09/1978    Years since quitting: 40.6  . Smokeless tobacco: Never Used  Substance and Sexual Activity  . Alcohol use: Yes    Comment: 1-2 glasses of wine daily and a 12 oz beer daily  . Drug use: No  . Sexual activity: Never  Lifestyle  . Physical activity    Days per week: Not on file    Minutes per session: Not on file  . Stress: Not on file  Relationships  . Social Herbalist on phone: Not on file    Gets together: Not on file    Attends religious service: Not on file    Active member of club or organization: Not on file    Attends meetings of clubs or organizations: Not on file    Relationship status:  Not on file  Other Topics Concern  . Not on file  Social History Narrative  . Not on file   Allergies  Allergen Reactions  . Penicillins Rash    Has patient had a PCN reaction causing immediate rash, facial/tongue/throat swelling, SOB or lightheadedness with hypotension: no Has patient had a PCN reaction causing severe rash involving mucus membranes or skin necrosis: yes Has patient had a PCN reaction that required hospitalization: no Has patient had a PCN reaction occurring within the last 10 years: no If all of the above answers are "NO", then may proceed with Cephalosporin use.   . Sulfa Antibiotics Rash    Under arm and thighs   Family History  Problem Relation Age of Onset  . Heart disease Mother   . Osteoporosis Mother   . Cancer Father        prostate  . Osteoporosis Sister   . Gout Brother   . Gout Maternal Grandmother   . Heart  disease Maternal Grandmother   . Alzheimer's disease Maternal Grandfather   . Osteoporosis Sister          Current Outpatient Medications (Other):  .  Biotin 10 MG TABS, Take 1 tablet by mouth daily.  .  Calcium Carb-Cholecalciferol (CALCIUM + D3 PO), Take 600 mg by mouth daily.  .  Cholecalciferol (VITAMIN D-3) 1000 units CAPS, Take 5,000 Units by mouth.  .  doxycycline (VIBRA-TABS) 100 MG tablet, Take 1 tablet (100 mg total) by mouth 2 (two) times daily. .  fluconazole (DIFLUCAN) 150 MG tablet, 1 tab PO today, rpt dose in 1 week .  Glucosamine 750 MG TABS, Take 1,200 mg by mouth daily.  .  metroNIDAZOLE (FLAGYL) 500 MG tablet, Take 1 tablet (500 mg total) by mouth 2 (two) times daily. .  Omega-3 Fatty Acids (FISH OIL) 1000 MG CAPS, Take 1 capsule by mouth daily. Marland Kitchen  omeprazole (PRILOSEC) 20 MG capsule, Take 1 capsule (20 mg total) by mouth daily.    Past medical history, social, surgical and family history all reviewed in electronic medical record.  No pertanent information unless stated regarding to the chief complaint.   Review of Systems:  No headache, visual changes, nausea, vomiting, diarrhea, constipation, dizziness, abdominal pain, skin rash, fevers, chills, night sweats, weight loss, swollen lymph nodes, body aches, joint swelling, muscle aches, chest pain, shortness of breath, mood changes.   Objective  Blood pressure 100/80, pulse 73, height 5\' 6"  (1.676 m), weight 105 lb (47.6 kg), SpO2 97 %.   General: No apparent distress alert and oriented x3 mood and affect normal, dressed appropriately.  HEENT: Pupils equal, extraocular movements intact  Respiratory: Patient's speak in full sentences and does not appear short of breath  Cardiovascular: No lower extremity edema, non tender, no erythema  Skin: Warm dry intact with no signs of infection or rash on extremities or on axial skeleton.  Abdomen: Soft nontender  Neuro: Cranial nerves II through XII are intact,  neurovascularly intact in all extremities with 2+ DTRs and 2+ pulses.  Lymph: No lymphadenopathy of posterior or anterior cervical chain or axillae bilaterally.  Gait normal with good balance and coordination.  MSK:  tender with full range of motion and good stability and symmetric strength and tone of shoulders, elbows, wrist, hip, knee and ankles bilaterally.  Arthritic changes of multiple joints Back exam still has significant loss of lordosis with degenerative scoliosis.  Only has 10 degrees of extension.  Tightness with Corky Sox test bilaterally and tightness  of the hamstrings but no radicular symptoms.  Less tender to palpation in the paraspinal musculature of the lumbar spine  Osteopathic findings  T6 extended rotated and side bent left L2 flexed rotated and side bent right Sacrum right on right    Impression and Recommendations:     This case required medical decision making of moderate complexity. The above documentation has been reviewed and is accurate and complete Lyndal Pulley, DO       Note: This dictation was prepared with Dragon dictation along with smaller phrase technology. Any transcriptional errors that result from this process are unintentional.

## 2019-03-11 NOTE — Assessment & Plan Note (Signed)
Spinal stenosis.  Discussed posture and ergonomics.  Patient has been doing relatively well and restarted osteopathic manipulation.  Hopefully patient will continue to improve now after the epidural.  Follow-up again in 4 to 6 weeks

## 2019-03-11 NOTE — Assessment & Plan Note (Signed)
Decision today to treat with OMT was based on Physical Exam  After verbal consent patient was treated with HVLA, ME, FPR techniques in  thoracic, lumbar and sacral areas  Patient tolerated the procedure well with improvement in symptoms  Patient given exercises, stretches and lifestyle modifications  See medications in patient instructions if given  Patient will follow up in 4-6 weeks 

## 2019-03-11 NOTE — Patient Instructions (Signed)
See me again in 4 weeks  

## 2019-04-09 ENCOUNTER — Ambulatory Visit (INDEPENDENT_AMBULATORY_CARE_PROVIDER_SITE_OTHER): Payer: Medicare Other | Admitting: Family Medicine

## 2019-04-09 ENCOUNTER — Encounter: Payer: Self-pay | Admitting: Family Medicine

## 2019-04-09 ENCOUNTER — Other Ambulatory Visit: Payer: Self-pay

## 2019-04-09 VITALS — BP 102/66 | HR 85 | Ht 66.0 in | Wt 106.0 lb

## 2019-04-09 DIAGNOSIS — M533 Sacrococcygeal disorders, not elsewhere classified: Secondary | ICD-10-CM | POA: Diagnosis not present

## 2019-04-09 DIAGNOSIS — M999 Biomechanical lesion, unspecified: Secondary | ICD-10-CM

## 2019-04-09 NOTE — Progress Notes (Signed)
[name] [name], [name] [address] Phone: [phone] Subjective:   [name], am serving as a scribe for Dr. [name].     CC: Low back pain follow-up  [id number]   03/11/2019 Spinal stenosis.  Discussed posture and ergonomics.  Patient has been doing relatively well and restarted osteopathic manipulation.  Hopefully patient will continue to improve now after the epidural.  Follow-up again in 4 to 6 weeks  Update 04/09/2019 Kelsey [name] is a 73 y.o. female coming in with complaint of back pain. Patient states that she is doing well since last visit. Pain is improving.  Patient states that she has noticed some increasing range of motion.  Patient denies any numbness at the moment.  Feels like she has made progress.     Past Medical History:  Diagnosis Date  . Allergy   . Anemia    iron deficiency  . 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 Bladder prolapse, female, acquired   . Chicken pox as a child  . Dysuria 09/13/2011  . Female bladder prolapse 09/13/2011  . Fibrocystic breast 09/14/2011  . GERD (gastroesophageal reflux disease)   . H/O measles   . H/O mumps   . Hyperlipidemia   . 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 Osteoarthrosis    right shoulder  . Osteoporosis    reclast in Oct  . Ovarian cyst 09/14/2011  . RMSF [facility] spotted fever) 2017  . Situational anxiety 03/19/2014   R/t grieving process & caregiver burden for husband who has dementia, alzheimer's.   Marland Kitchen Uterine fibroid 11/2012   See Dr. Cletis Media note, surgery 11/2012  . Vaginitis 09/13/2011   Past Surgical History:  Procedure Laterality Date  . DILATATION & CURRETTAGE/HYSTEROSCOPY WITH RESECTOCOPE N/A 11/06/2012   Procedure: DILATATION & CURETTAGE/HYSTEROSCOPY WITH RESECTOCOPE;  Surgeon: Alwyn Pea, MD;  Location: [facility];  Service: Gynecology;  Laterality: N/A;  . HERNIA REPAIR  1992  . small intestine hernia  1994  . WISDOM TOOTH EXTRACTION     Social  History   Socioeconomic History  . Marital status: Married    Spouse name: Not on file  . Number of children: Not on file  . Years of education: Not on file  . Highest education level: Not on file  Occupational History  . Not on file  Social Needs  . Financial resource strain: Not on file  . Food insecurity    Worry: Not on file    Inability: Not on file  . Transportation needs    Medical: Not on file    Non-medical: Not on file  Tobacco Use  . Smoking status: Former Smoker    Packs/day: 0.30    Years: 10.00    Pack years: 3.00    Types: Cigarettes    Quit date: 07/09/1978    Years since quitting: 40.7  . Smokeless tobacco: Never Used  Substance and Sexual Activity  . Alcohol use: Yes    Comment: 1-2 glasses of wine daily and a 12 oz beer daily  . Drug use: No  . Sexual activity: Never  Lifestyle  . Physical activity    Days per week: Not on file    Minutes per session: Not on file  . Stress: Not on file  Relationships  . Social Herbalist on phone: Not on file    Gets together: Not on file    Attends religious service: Not on file  Active member of club or organization: Not on file    Attends meetings of clubs or organizations: Not on file    Relationship status: Not on file  Other Topics Concern  . Not on file  Social History Narrative  . Not on file   Allergies  Allergen Reactions  . Penicillins Rash    Has patient had a PCN reaction causing immediate rash, facial/tongue/throat swelling, SOB or lightheadedness with hypotension: no Has patient had a PCN reaction causing severe rash involving mucus membranes or skin necrosis: yes Has patient had a PCN reaction that required hospitalization: no Has patient had a PCN reaction occurring within the last 10 years: no If all of the above answers are "NO", then may proceed with Cephalosporin use.   . Sulfa Antibiotics Rash    Under arm and thighs   Family History  Problem Relation Age of Onset  .  Heart disease Mother   . Osteoporosis Mother   . Cancer Father        prostate  . Osteoporosis Sister   . Gout Brother   . Gout Maternal Grandmother   . Heart disease Maternal Grandmother   . Alzheimer's disease Maternal Grandfather   . Osteoporosis Sister          Current Outpatient Medications (Other):  .  Biotin 10 MG TABS, Take 1 tablet by mouth daily.  .  Calcium Carb-Cholecalciferol (CALCIUM + D3 PO), Take 600 mg by mouth daily.  .  Cholecalciferol (VITAMIN D-3) 1000 units CAPS, Take 5,000 Units by mouth.  .  doxycycline (VIBRA-TABS) 100 MG tablet, Take 1 tablet (100 mg total) by mouth 2 (two) times daily. .  fluconazole (DIFLUCAN) 150 MG tablet, 1 tab PO today, rpt dose in 1 week .  Glucosamine 750 MG TABS, Take 1,200 mg by mouth daily.  .  metroNIDAZOLE (FLAGYL) 500 MG tablet, Take 1 tablet (500 mg total) by mouth 2 (two) times daily. .  Omega-3 Fatty Acids (FISH OIL) 1000 MG CAPS, Take 1 capsule by mouth daily. Marland Kitchen  omeprazole (PRILOSEC) 20 MG capsule, Take 1 capsule (20 mg total) by mouth daily.    Past medical history, social, surgical and family history all reviewed in electronic medical record.  No pertanent information unless stated regarding to the chief complaint.   Review of Systems:  No headache, visual changes, nausea, vomiting, diarrhea, constipation, dizziness, abdominal pain, skin rash, fevers, chills, night sweats, weight loss, swollen lymph nodes, body aches, joint swelling,  chest pain, shortness of breath, mood changes.  Positive muscle aches  Objective  Blood pressure 102/66, pulse 85, height 5\' 6"  (1.676 m), weight 106 lb (48.1 kg), SpO2 97 %.    General: No apparent distress alert and oriented x3 mood and affect normal, dressed appropriately.  Still somewhat cachectic HEENT: Pupils equal, extraocular movements intact  Respiratory: Patient's speak in full sentences and does not appear short of breath  Cardiovascular: No lower extremity edema, non  tender, no erythema  Skin: Warm dry intact with no signs of infection or rash on extremities or on axial skeleton.  Abdomen: Soft nontender  Neuro: Cranial nerves II through XII are intact, neurovascularly intact in all extremities with 2+ DTRs and 2+ pulses.  Lymph: No lymphadenopathy of posterior or anterior cervical chain or axillae bilaterally.  Gait mild antalgic MSK:  tender with limited range of motion and stability symmetric strength and tone of shoulders, elbows, wrist, hip, knee and ankles bilaterally.  Arthritic changes of multiple joints  Low back exam does have loss of lordosis.  Mild degenerative scoliosis.  Severe tenderness to palpation more over the right sacroiliac joint.  Tightness with straight leg test but no radicular symptoms.  Osteopathic findings  T9 extended rotated and side bent left L2 flexed rotated and side bent right Sacrum right on right     Impression and Recommendations:     This case required medical decision making of moderate complexity. The above documentation has been reviewed and is accurate and complete Lyndal Pulley, DO       Note: This dictation was prepared with Dragon dictation along with smaller phrase technology. Any transcriptional errors that result from this process are unintentional.

## 2019-04-09 NOTE — Assessment & Plan Note (Signed)
More discomfort here but patient does have more of the degenerative spinal stenosis.  We discussed posture and ergonomics.  Discussed him continuing to be active.  Patient will continue to do this.  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  I do believe patient has significant amount of stress as well.  Patient will follow-up with me again in 4 weeks

## 2019-04-09 NOTE — Patient Instructions (Signed)
Good to see you Go vote! See me again in 4-6 weeks

## 2019-04-09 NOTE — Assessment & Plan Note (Signed)
Decision today to treat with OMT was based on Physical Exam  After verbal consent patient was treated with HVLA, ME, FPR techniques in  thoracic, lumbar and sacral areas  Patient tolerated the procedure well with improvement in symptoms  Patient given exercises, stretches and lifestyle modifications  See medications in patient instructions if given  Patient will follow up in 4-8 weeks 

## 2019-04-14 ENCOUNTER — Other Ambulatory Visit (HOSPITAL_COMMUNITY)
Admission: RE | Admit: 2019-04-14 | Discharge: 2019-04-14 | Disposition: A | Discharge status: Home / Self Care | Payer: Medicare Other | Source: Ambulatory Visit | Attending: Family Medicine | Admitting: Family Medicine

## 2019-04-14 ENCOUNTER — Ambulatory Visit (INDEPENDENT_AMBULATORY_CARE_PROVIDER_SITE_OTHER): Payer: Medicare Other | Admitting: Family Medicine

## 2019-04-14 ENCOUNTER — Encounter: Payer: Self-pay | Admitting: Family Medicine

## 2019-04-14 ENCOUNTER — Other Ambulatory Visit: Payer: Self-pay

## 2019-04-14 VITALS — BP 108/70 | HR 73 | Temp 98.3°F | Resp 16 | Ht 66.0 in | Wt 106.0 lb

## 2019-04-14 DIAGNOSIS — N898 Other specified noninflammatory disorders of vagina: Secondary | ICD-10-CM | POA: Insufficient documentation

## 2019-04-14 DIAGNOSIS — Z23 Encounter for immunization: Secondary | ICD-10-CM

## 2019-04-14 MED ORDER — METRONIDAZOLE 500 MG PO TABS: 500.0000 mg | ORAL_TABLET | Freq: Two times a day (BID) | ORAL | 0 refills | Status: DC

## 2019-04-14 MED ORDER — FLUCONAZOLE 150 MG PO TABS: 150.0000 mg | ORAL_TABLET | Freq: Once | ORAL | 0 refills | Status: DC

## 2019-04-14 MED ORDER — FLUCONAZOLE 150 MG PO TABS: ORAL_TABLET | ORAL | 0 refills | Status: DC

## 2019-04-14 NOTE — Patient Instructions (Signed)
I have called in diflucan- start today and repeat in 3 days.  Start flagyl every 12 hours for 7 days for bacteruria.    Vaginal Yeast infection, Adult  Vaginal yeast infection is a condition that causes vaginal discharge as well as soreness, swelling, and redness (inflammation) of the vagina. This is a common condition. Some women get this infection frequently. What are the causes? This condition is caused by a change in the normal balance of the yeast (candida) and bacteria that live in the vagina. This change causes an overgrowth of yeast, which causes the inflammation. What increases the risk? The condition is more likely to develop in women who:  Take antibiotic medicines.  Have diabetes.  Take birth control pills.  Are pregnant.  Douche often.  Have a weak body defense system (immune system).  Have been taking steroid medicines for a long time.  Frequently wear tight clothing. What are the signs or symptoms? Symptoms of this condition include:  White, thick, creamy vaginal discharge.  Swelling, itching, redness, and irritation of the vagina. The lips of the vagina (vulva) may be affected as well.  Pain or a burning feeling while urinating.  Pain during sex. How is this diagnosed? This condition is diagnosed based on:  Your medical history.  A physical exam.  A pelvic exam. Your health care provider will examine a sample of your vaginal discharge under a microscope. Your health care provider may send this sample for testing to confirm the diagnosis. How is this treated? This condition is treated with medicine. Medicines may be over-the-counter or prescription. You may be told to use one or more of the following:  Medicine that is taken by mouth (orally).  Medicine that is applied as a cream (topically).  Medicine that is inserted directly into the vagina (suppository). Follow these instructions at home:  Lifestyle  Do not have sex until your health care  provider approves. Tell your sex partner that you have a yeast infection. That person should go to his or her health care provider and ask if they should also be treated.  Do not wear tight clothes, such as pantyhose or tight pants.  Wear breathable cotton underwear. General instructions  Take or apply over-the-counter and prescription medicines only as told by your health care provider.  Eat more yogurt. This may help to keep your yeast infection from returning.  Do not use tampons until your health care provider approves.  Try taking a sitz bath to help with discomfort. This is a warm water bath that is taken while you are sitting down. The water should only come up to your hips and should cover your buttocks. Do this 3-4 times per day or as told by your health care provider.  Do not douche.  If you have diabetes, keep your blood sugar levels under control.  Keep all follow-up visits as told by your health care provider. This is important. Contact a health care provider if:  You have a fever.  Your symptoms go away and then return.  Your symptoms do not get better with treatment.  Your symptoms get worse.  You have new symptoms.  You develop blisters in or around your vagina.  You have blood coming from your vagina and it is not your menstrual period.  You develop pain in your abdomen. Summary  Vaginal yeast infection is a condition that causes discharge as well as soreness, swelling, and redness (inflammation) of the vagina.  This condition is treated with medicine.  Medicines may be over-the-counter or prescription.  Take or apply over-the-counter and prescription medicines only as told by your health care provider.  Do not douche. Do not have sex or use tampons until your health care provider approves.  Contact a health care provider if your symptoms do not get better with treatment or your symptoms go away and then return. This information is not intended to  replace advice given to you by your health care provider. Make sure you discuss any questions you have with your health care provider. Document Released: 04/04/2005 Document Revised: 11/11/2017 Document Reviewed: 11/11/2017 Elsevier Patient Education  [employer] [facility].   Bacterial Vaginosis  Bacterial vaginosis is an infection of the vagina. It happens when too many normal germs (healthy bacteria) grow in the vagina. This infection puts you at risk for infections from sex (STIs). Treating this infection can lower your risk for some STIs. You should also treat this if you are pregnant. It can cause your baby to be born early. Follow these instructions at home: Medicines  Take over-the-counter and prescription medicines only as told by your doctor.  Take or use your antibiotic medicine as told by your doctor. Do not stop taking or using it even if you start to feel better. General instructions  If you your sexual partner is a woman, tell her that you have this infection. She needs to get treatment if she has symptoms. If you have a female partner, he does not need to be treated.  During treatment: ? Avoid sex. ? Do not douche. ? Avoid alcohol as told. ? Avoid breastfeeding as told.  Drink enough fluid to keep your pee (urine) clear or pale yellow.  Keep your vagina and butt (rectum) clean. ? Wash the area with warm water every day. ? Wipe from front to back after you use the toilet.  Keep all follow-up visits as told by your doctor. This is important. Preventing this condition  Do not douche.  Use only warm water to wash around your vagina.  Use protection when you have sex. This includes: ? Latex condoms. ? Dental dams.  Limit how many people you have sex with. It is best to only have sex with the same person (be monogamous).  Get tested for STIs. Have your partner get tested.  Wear underwear that is cotton or lined with cotton.  Avoid tight pants and pantyhose. This is  most important in summer.  Do not use any products that have nicotine or tobacco in them. These include cigarettes and e-cigarettes. If you need help quitting, ask your doctor.  Do not use illegal drugs.  Limit how much alcohol you drink. Contact a doctor if:  Your symptoms do not get better, even after you are treated.  You have more discharge or pain when you pee (urinate).  You have a fever.  You have pain in your belly (abdomen).  You have pain with sex.  Your bleed from your vagina between periods. Summary  This infection happens when too many germs (bacteria) grow in the vagina.  Treating this condition can lower your risk for some infections from sex (STIs).  You should also treat this if you are pregnant. It can cause early (premature) birth.  Do not stop taking or using your antibiotic medicine even if you start to feel better. This information is not intended to replace advice given to you by your health care provider. Make sure you discuss any questions you have with your health care  provider. Document Released: 04/03/2008 Document Revised: 06/07/2017 Document Reviewed: 03/10/2016 Elsevier Patient Education  2020 [employer].

## 2019-04-14 NOTE — Progress Notes (Signed)
Kelsey [name] , 08/21/1945, 73 y.o., female MRN: [id number] Patient Care Team    Relationship Specialty Notifications Start End  [name], DO PCP - General Family Medicine  05/27/15   [name], MD Consulting Physician Gastroenterology  12/08/15   Calvert Cantor, MD Consulting Physician Ophthalmology  12/08/15   Lyndal Pulley, DO Consulting Physician Family Medicine  04/07/18     Chief Complaint  Patient presents with  . Vaginitis    Pt complains of possible yeast infection. Creamy Discharge with odor at times.      Subjective: Pt presents for an OV with complaints of vaginal discharge of 1 week  duration.  Associated symptoms include creamy white discharge with odor, mild itching.  She states it is worsened over the last week but she does not feel completely resolved from her diagnosis December 26, 2018. Pt has tried nothing to ease their symptoms.   Depression screen [facility] 2/9 12/26/2018 04/07/2018 03/05/2018 11/06/2017 12/17/2016  Decreased Interest 0 0 0 0 0  Down, Depressed, Hopeless 0 0 0 0 0  PHQ - 2 Score 0 0 0 0 0    Allergies  Allergen Reactions  . Penicillins Rash    Has patient had a PCN reaction causing immediate rash, facial/tongue/throat swelling, SOB or lightheadedness with hypotension: no Has patient had a PCN reaction causing severe rash involving mucus membranes or skin necrosis: yes Has patient had a PCN reaction that required hospitalization: no Has patient had a PCN reaction occurring within the last 10 years: no If all of the above answers are "NO", then may proceed with Cephalosporin use.   . Sulfa Antibiotics Rash    Under arm and thighs   Social History   Social History Narrative  . Not on file   Past Medical History:  Diagnosis Date  . Allergy   . Anemia    iron deficiency  . 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 Bladder prolapse, female, acquired   . Chicken pox as a child  . Dysuria 09/13/2011  . Female bladder prolapse 09/13/2011  .  Fibrocystic breast 09/14/2011  . GERD (gastroesophageal reflux disease)   . H/O measles   . H/O mumps   . Hyperlipidemia   . 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 Osteoarthrosis    right shoulder  . Osteoporosis    reclast in Oct  . Ovarian cyst 09/14/2011  . RMSF [facility] spotted fever) 2017  . Situational anxiety 03/19/2014   R/t grieving process & caregiver burden for husband who has dementia, alzheimer's.   Marland Kitchen Uterine fibroid 11/2012   See Dr. Cletis Media note, surgery 11/2012  . Vaginitis 09/13/2011   Past Surgical History:  Procedure Laterality Date  . DILATATION & CURRETTAGE/HYSTEROSCOPY WITH RESECTOCOPE N/A 11/06/2012   Procedure: DILATATION & CURETTAGE/HYSTEROSCOPY WITH RESECTOCOPE;  Surgeon: Alwyn Pea, MD;  Location: [facility];  Service: Gynecology;  Laterality: N/A;  . HERNIA REPAIR  1992  . small intestine hernia  1994  . WISDOM TOOTH EXTRACTION     Family History  Problem Relation Age of Onset  . Heart disease Mother   . Osteoporosis Mother   . Cancer Father        prostate  . Osteoporosis Sister   . Gout Brother   . Gout Maternal Grandmother   . Heart disease Maternal Grandmother   . Alzheimer's disease Maternal Grandfather   . Osteoporosis Sister    Allergies as of 04/14/2019      Reactions   Penicillins  Rash   Has patient had a PCN reaction causing immediate rash, facial/tongue/throat swelling, SOB or lightheadedness with hypotension: no Has patient had a PCN reaction causing severe rash involving mucus membranes or skin necrosis: yes Has patient had a PCN reaction that required hospitalization: no Has patient had a PCN reaction occurring within the last 10 years: no If all of the above answers are "NO", then may proceed with Cephalosporin use.   Sulfa Antibiotics Rash   Under arm and thighs      Medication List       Accurate as of April 14, 2019  3:55 PM. If you have any questions, ask your nurse or doctor.        Biotin 10 MG Tabs Take 1 tablet by mouth  daily.   CALCIUM + D3 PO Take 600 mg by mouth daily.   doxycycline 100 MG tablet Commonly known as: VIBRA-TABS Take 1 tablet (100 mg total) by mouth 2 (two) times daily.   Fish Oil 1000 MG Caps Take 1 capsule by mouth daily.   fluconazole 150 MG tablet Commonly known as: DIFLUCAN 1 tab PO today, rpt dose in 1 week   Glucosamine 750 MG Tabs Take 1,200 mg by mouth daily.   metroNIDAZOLE 500 MG tablet Commonly known as: FLAGYL Take 1 tablet (500 mg total) by mouth 2 (two) times daily.   omeprazole 20 MG capsule Commonly known as: PRILOSEC Take 1 capsule (20 mg total) by mouth daily.   Vitamin D-3 25 MCG (1000 UT) Caps Take 5,000 Units by mouth.       All past medical history, surgical history, allergies, family history, immunizations andmedications were updated in the EMR today and reviewed under the history and medication portions of their EMR.     ROS: Negative, with the exception of above mentioned in HPI   Objective:  BP 108/70 (BP Location: Left Arm, Patient Position: Sitting, Cuff Size: Normal)   Pulse 73   Temp 98.3 F (36.8 C) (Temporal)   Resp 16   Ht 5\' 6"  (1.676 m)   Wt 106 lb (48.1 kg)   SpO2 99%   BMI 17.11 kg/m  Body mass index is 17.11 kg/m. Gen: Afebrile. No acute distress. Nontoxic in appearance, well developed, well nourished.  HENT: AT. [place].  Eyes:Pupils Equal Round Reactive to light, Extraocular movements intact,  Conjunctiva without redness, discharge or icterus. Abd: Soft. NTND. BS present. Skin: no rashes, purpura or petechiae.  Neuro: Normal gait. PERLA. EOMi. Alert. Oriented x3    No exam data present No results found. No results found for this or any previous visit (from the past 24 hour(s)).  Assessment/Plan: Kelsey [name] is a 73 y.o. female present for OV for  Vaginal discharge - diflucan prescribed x2.  Along with Flagyl twice daily x7 days urine cytology sent.  - Urine cytology ancillary only([place]) - f/u  PRN-patient to follow-up in 2 weeks if any symptoms remain for retesting.  Consider longer therapy trial if still positive.  Influenza vaccine administered    Reviewed expectations re: course of current medical issues.  Discussed self-management of symptoms.  Outlined signs and symptoms indicating need for more acute intervention.  Patient verbalized understanding and all questions were answered.  Patient received an After-Visit Summary.    No orders of the defined types were placed in this encounter.    Note is dictated utilizing voice recognition software. Although note has been proof read prior to signing, occasional typographical errors still can  be missed. If any questions arise, please do not hesitate to call for verification.   electronically signed by:  Howard Pouch, DO  [place]

## 2019-04-15 ENCOUNTER — Encounter: Payer: Self-pay | Admitting: Family Medicine

## 2019-04-21 ENCOUNTER — Telehealth: Payer: Self-pay | Admitting: Family Medicine

## 2019-04-21 MED ORDER — CLINDAMYCIN PHOSPHATE 100 MG VA SUPP: 100.0000 mg | Freq: Every day | VAGINAL | 0 refills | Status: DC

## 2019-04-21 NOTE — Telephone Encounter (Signed)
Please inform patient the following information: Her cultures returned with a bacterial vaginosis.  I recommend she complete the Flagyl that was prescribed last week and I have also called in vaginal suppositories to insert into VAGINA- 1 suppository before bed for 3 nights.  This should completely treat her infection.

## 2019-04-22 NOTE — Telephone Encounter (Signed)
LM for pt to return call to discuss results/recommendations.  

## 2019-04-22 NOTE — Telephone Encounter (Signed)
Pt was called and message was left for pt to return call

## 2019-04-23 LAB — URINE CYTOLOGY ANCILLARY ONLY
Bacterial Vaginitis-Urine: POSITIVE — AB
Candida Urine: NEGATIVE

## 2019-04-23 NOTE — Telephone Encounter (Signed)
Pt was called and given information, she verbalized understanding  

## 2019-05-12 ENCOUNTER — Ambulatory Visit (INDEPENDENT_AMBULATORY_CARE_PROVIDER_SITE_OTHER): Payer: Medicare Other | Admitting: Family Medicine

## 2019-05-12 ENCOUNTER — Encounter: Payer: Self-pay | Admitting: Family Medicine

## 2019-05-12 VITALS — BP 96/70 | HR 91 | Ht 66.0 in | Wt 107.0 lb

## 2019-05-12 DIAGNOSIS — M999 Biomechanical lesion, unspecified: Secondary | ICD-10-CM | POA: Diagnosis not present

## 2019-05-12 DIAGNOSIS — M545 Low back pain, unspecified: Secondary | ICD-10-CM

## 2019-05-12 DIAGNOSIS — G8929 Other chronic pain: Secondary | ICD-10-CM | POA: Diagnosis not present

## 2019-05-12 DIAGNOSIS — M48061 Spinal stenosis, lumbar region without neurogenic claudication: Secondary | ICD-10-CM | POA: Diagnosis not present

## 2019-05-12 NOTE — Assessment & Plan Note (Signed)
Decision today to treat with OMT was based on Physical Exam  After verbal consent patient was treated with HVLA, ME, FPR techniques in  thoracic, lumbar and sacral areas  Patient tolerated the procedure well with improvement in symptoms  Patient given exercises, stretches and lifestyle modifications  See medications in patient instructions if given  Patient will follow up in 4 weeks 

## 2019-05-12 NOTE — Assessment & Plan Note (Signed)
Severe overall, encourage patient to consider another epidural with the last one being greater than 4 months.  Discussed with patient about different medications but patient wants to avoid them on a regular basis.  Responded fairly well to osteopathic manipulation.  Follow-up with me again in 4 weeks

## 2019-05-12 NOTE — Progress Notes (Signed)
[name] [place], [place] [address] Phone: [phone] Subjective:   I Kelsey [name] am serving as a Education administrator for Dr. [name].   CC: Low back pain follow-up  [id number]  Kelsey [name] is a 73 y.o. female coming in with complaint of SI joint pain. Epidural injection back in July. Last week or 2 her pain has started to come back. States she is disappointed and hoped that injection would last longer. Wants to know how often can Dr. Tamala Julian write prescription for insoles.        Past Medical History:  Diagnosis Date  . Allergy   . Anemia    iron deficiency  . 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 Bladder prolapse, female, acquired   . Chicken pox as a child  . Dysuria 09/13/2011  . Female bladder prolapse 09/13/2011  . Fibrocystic breast 09/14/2011  . GERD (gastroesophageal reflux disease)   . H/O measles   . H/O mumps   . Hyperlipidemia   . 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 Osteoarthrosis    right shoulder  . Osteoporosis    reclast in Oct  . Ovarian cyst 09/14/2011  . [facility] spotted fever) 2017  . Situational anxiety 03/19/2014   R/t grieving process & caregiver burden for husband who has dementia, alzheimer's.   Marland Kitchen Uterine fibroid 11/2012   See Dr. Cletis Media note, surgery 11/2012  . Vaginitis 09/13/2011   Past Surgical History:  Procedure Laterality Date  . DILATATION & CURRETTAGE/HYSTEROSCOPY WITH RESECTOCOPE N/A 11/06/2012   Procedure: DILATATION & CURETTAGE/HYSTEROSCOPY WITH RESECTOCOPE;  Surgeon: Alwyn Pea, MD;  Location: [facility];  Service: Gynecology;  Laterality: N/A;  . HERNIA REPAIR  1992  . small intestine hernia  1994  . WISDOM TOOTH EXTRACTION     Social History   Socioeconomic History  . Marital status: Married    Spouse name: Not on file  . Number of children: Not on file  . Years of education: Not on file  . Highest education level: Not on file  Occupational History  . Not on file  Social Needs   . Financial resource strain: Not on file  . Food insecurity    Worry: Not on file    Inability: Not on file  . Transportation needs    Medical: Not on file    Non-medical: Not on file  Tobacco Use  . Smoking status: Former Smoker    Packs/day: 0.30    Years: 10.00    Pack years: 3.00    Types: Cigarettes    Quit date: 07/09/1978    Years since quitting: 40.8  . Smokeless tobacco: Never Used  Substance and Sexual Activity  . Alcohol use: Yes    Comment: 1-2 glasses of wine daily and a 12 oz beer daily  . Drug use: No  . Sexual activity: Never  Lifestyle  . Physical activity    Days per week: Not on file    Minutes per session: Not on file  . Stress: Not on file  Relationships  . Social Herbalist on phone: Not on file    Gets together: Not on file    Attends religious service: Not on file    Active member of club or organization: Not on file    Attends meetings of clubs or organizations: Not on file    Relationship status: Not on file  Other Topics Concern  . Not on file  Social  History Narrative  . Not on file   Allergies  Allergen Reactions  . Penicillins Rash    Has patient had a PCN reaction causing immediate rash, facial/tongue/throat swelling, SOB or lightheadedness with hypotension: no Has patient had a PCN reaction causing severe rash involving mucus membranes or skin necrosis: yes Has patient had a PCN reaction that required hospitalization: no Has patient had a PCN reaction occurring within the last 10 years: no If all of the above answers are "NO", then may proceed with Cephalosporin use.   . Sulfa Antibiotics Rash    Under arm and thighs   Family History  Problem Relation Age of Onset  . Heart disease Mother   . Osteoporosis Mother   . Cancer Father        prostate  . Osteoporosis Sister   . Gout Brother   . Gout Maternal Grandmother   . Heart disease Maternal Grandmother   . Alzheimer's disease Maternal Grandfather   . Osteoporosis  Sister          Current Outpatient Medications (Other):  .  Biotin 10 MG TABS, Take 1 tablet by mouth daily.  .  Calcium Carb-Cholecalciferol (CALCIUM + D3 PO), Take 600 mg by mouth daily.  .  Cholecalciferol (VITAMIN D-3) 1000 units CAPS, Take 5,000 Units by mouth.  .  clindamycin (CLEOCIN) 100 MG vaginal suppository, Place 1 suppository (100 mg total) vaginally at bedtime. .  fluconazole (DIFLUCAN) 150 MG tablet, 1 tab today, then repeat in 3 days. .  Glucosamine 750 MG TABS, Take 1,200 mg by mouth daily.  .  metroNIDAZOLE (FLAGYL) 500 MG tablet, Take 1 tablet (500 mg total) by mouth 2 (two) times daily. .  Omega-3 Fatty Acids (FISH OIL) 1000 MG CAPS, Take 1 capsule by mouth daily.    Past medical history, social, surgical and family history all reviewed in electronic medical record.  No pertanent information unless stated regarding to the chief complaint.   Review of Systems:  No headache, visual changes, nausea, vomiting, diarrhea, constipation, dizziness, abdominal pain, skin rash, fevers, chills, night sweats, weight loss, swollen lymph nodes, body aches, joint swelling,  chest pain, shortness of breath, mood changes.  Positive muscle aches  Objective  Blood pressure 96/70, pulse 91, height 5\' 6"  (1.676 m), weight 107 lb (48.5 kg), SpO2 98 %.    General: No apparent distress alert and oriented x3 mood and affect normal, dressed appropriately.  HEENT: Pupils equal, extraocular movements intact  Respiratory: Patient's speak in full sentences and does not appear short of breath  Cardiovascular: No lower extremity edema, non tender, no erythema  Skin: Warm dry intact with no signs of infection or rash on extremities or on axial skeleton.  Abdomen: Soft nontender  Neuro: Cranial nerves II through XII are intact, neurovascularly intact in all extremities with 2+ DTRs and 2+ pulses.  Lymph: No lymphadenopathy of posterior or anterior cervical chain or axillae bilaterally.  Gait  normal with good balance and coordination.  MSK:  Non tender with full range of motion and good stability and symmetric strength and tone of shoulders, elbows, wrist, hip, knee and ankles bilaterally.  Patient does have some severe loss of lordosis, decreased range of motion in all planes.  Patient does have tenderness to palpation in the parascapular region bilaterally. Patient has severe tightness in the hamstrings and some mild radicular symptoms bilaterally.  Worsening pain with any extension greater than 5 degrees of the lumbar spine at the moment.  4-5 strength  of the lower extremities.  Osteopathic findings T9 extended rotated and side bent left L2 flexed rotated and side bent right Sacrum right on right      Impression and Recommendations:     This case required medical decision making of moderate complexity. The above documentation has been reviewed and is accurate and complete Lyndal Pulley, DO       Note: This dictation was prepared with Dragon dictation along with smaller phrase technology. Any transcriptional errors that result from this process are unintentional.

## 2019-05-12 NOTE — Patient Instructions (Signed)
Epidural call [facility] [id number] See me again in 4 weeks

## 2019-05-21 ENCOUNTER — Ambulatory Visit (INDEPENDENT_AMBULATORY_CARE_PROVIDER_SITE_OTHER): Payer: Medicare Other | Admitting: Family Medicine

## 2019-05-21 ENCOUNTER — Encounter: Payer: Self-pay | Admitting: Family Medicine

## 2019-05-21 ENCOUNTER — Other Ambulatory Visit: Payer: Self-pay

## 2019-05-21 DIAGNOSIS — M2061 Acquired deformities of toe(s), unspecified, right foot: Secondary | ICD-10-CM | POA: Diagnosis not present

## 2019-05-21 NOTE — Progress Notes (Signed)
Procedure Note   Patient was fitted for a : standard, cushioned, semi-rigid orthotic. The orthotic was heated and afterward the patient patient seated position and molded The patient was positioned in subtalar neutral position and 10 degrees of ankle dorsiflexion in a weight bearing stance. After completion of molding, patient did have orthotic management The blank was ground to a stable position for weight bearing. Size: 7.5-8 Base: Carbon fiber Additional Posting and Padding: left and right medial 270/90 300/110 lateral 200/40 transverse 250/35 The patient ambulated these, and they were very comfortable.

## 2019-05-21 NOTE — Assessment & Plan Note (Signed)
Patient has significant breakdown of the transverse arch.  Patient will be put in the orthotics.  Increase wear slowly over the course the neck several days.  Discussed other shoes patient can do on a more regular basis that I think will be more beneficial with more stability.  Follow-up with me again for her other problems including her back pain in 3 weeks

## 2019-05-21 NOTE — Patient Instructions (Addendum)
Good to see you Look in REI for the following shoes: Arvid Right

## 2019-05-22 ENCOUNTER — Ambulatory Visit: Payer: Medicare Other | Admitting: Family Medicine

## 2019-05-25 ENCOUNTER — Other Ambulatory Visit: Payer: Self-pay

## 2019-05-25 ENCOUNTER — Ambulatory Visit
Admission: RE | Admit: 2019-05-25 | Discharge: 2019-05-25 | Disposition: A | Discharge status: Home / Self Care | Payer: Medicare Other | Source: Ambulatory Visit | Attending: Family Medicine | Admitting: Family Medicine

## 2019-05-25 DIAGNOSIS — Z1231 Encounter for screening mammogram for malignant neoplasm of breast: Secondary | ICD-10-CM | POA: Diagnosis not present

## 2019-06-16 ENCOUNTER — Ambulatory Visit (INDEPENDENT_AMBULATORY_CARE_PROVIDER_SITE_OTHER): Payer: Medicare Other | Admitting: Family Medicine

## 2019-06-16 ENCOUNTER — Encounter: Payer: Self-pay | Admitting: Family Medicine

## 2019-06-16 VITALS — BP 90/52 | HR 80 | Ht 66.0 in | Wt 111.0 lb

## 2019-06-16 DIAGNOSIS — M48061 Spinal stenosis, lumbar region without neurogenic claudication: Secondary | ICD-10-CM

## 2019-06-16 DIAGNOSIS — M999 Biomechanical lesion, unspecified: Secondary | ICD-10-CM | POA: Diagnosis not present

## 2019-06-16 NOTE — Patient Instructions (Signed)
  [address] 1st floor [place], [place] [address] Phone [phone]  Happy Holidays! See me again in 4 weeks

## 2019-06-16 NOTE — Progress Notes (Signed)
[name] [place], [place] [address] Phone: [phone] Subjective:   I Kelsey [name] am serving as a Education administrator for Dr. [name].  I'm seeing this patient by the request  of:    This visit occurred during the SARS-CoV-2 public health emergency.  Safety protocols were in place, including screening questions prior to the visit, additional usage of staff PPE, and extensive cleaning of exam room while observing appropriate contact time as indicated for disinfecting solutions.     CC: Low back pain follow-up  [id number]   05/12/2019 OMT  Update 06/16/2019  Kelsey [name] is a 73 y.o. female coming in with complaint of back pain. Patient seen for OMT to manage her back pain. Patient states she hasn't gotten on epidural yet. Has been stretching. She feels like  how she felt before epidural.  Patient continues to have discomfort on a regular basis.  Patient states that after sitting for long amount of time significantly worsening pain from time to time.  Discussed with patient that she has been trying to do the exercises but still taking care of her ailing husband.     Past Medical History:  Diagnosis Date  . Allergy   . Anemia    iron deficiency  . 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 Bladder prolapse, female, acquired   . Chicken pox as a child  . Dysuria 09/13/2011  . Female bladder prolapse 09/13/2011  . Fibrocystic breast 09/14/2011  . GERD (gastroesophageal reflux disease)   . H/O measles   . H/O mumps   . Hyperlipidemia   . 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 Osteoarthrosis    right shoulder  . Osteoporosis    reclast in Oct  . Ovarian cyst 09/14/2011  . [facility] spotted fever) 2017  . Situational anxiety 03/19/2014   R/t grieving process & caregiver burden for husband who has dementia, alzheimer's.   Marland Kitchen Uterine fibroid 11/2012   See Dr. Cletis Media note, surgery 11/2012  . Vaginitis 09/13/2011   Past Surgical History:  Procedure  Laterality Date  . DILATATION & CURRETTAGE/HYSTEROSCOPY WITH RESECTOCOPE N/A 11/06/2012   Procedure: DILATATION & CURETTAGE/HYSTEROSCOPY WITH RESECTOCOPE;  Surgeon: Alwyn Pea, MD;  Location: [facility];  Service: Gynecology;  Laterality: N/A;  . HERNIA REPAIR  1992  . small intestine hernia  1994  . WISDOM TOOTH EXTRACTION     Social History   Socioeconomic History  . Marital status: Married    Spouse name: Not on file  . Number of children: Not on file  . Years of education: Not on file  . Highest education level: Not on file  Occupational History  . Not on file  Social Needs  . Financial resource strain: Not on file  . Food insecurity    Worry: Not on file    Inability: Not on file  . Transportation needs    Medical: Not on file    Non-medical: Not on file  Tobacco Use  . Smoking status: Former Smoker    Packs/day: 0.30    Years: 10.00    Pack years: 3.00    Types: Cigarettes    Quit date: 07/09/1978    Years since quitting: 40.9  . Smokeless tobacco: Never Used  Substance and Sexual Activity  . Alcohol use: Yes    Comment: 1-2 glasses of wine daily and a 12 oz beer daily  . Drug use: No  . Sexual activity: Never  Lifestyle  . Physical  activity    Days per week: Not on file    Minutes per session: Not on file  . Stress: Not on file  Relationships  . Social Herbalist on phone: Not on file    Gets together: Not on file    Attends religious service: Not on file    Active member of club or organization: Not on file    Attends meetings of clubs or organizations: Not on file    Relationship status: Not on file  Other Topics Concern  . Not on file  Social History Narrative  . Not on file   Allergies  Allergen Reactions  . Penicillins Rash    Has patient had a PCN reaction causing immediate rash, facial/tongue/throat swelling, SOB or lightheadedness with hypotension: no Has patient had a PCN reaction causing severe rash involving mucus membranes or skin  necrosis: yes Has patient had a PCN reaction that required hospitalization: no Has patient had a PCN reaction occurring within the last 10 years: no If all of the above answers are "NO", then may proceed with Cephalosporin use.   . Sulfa Antibiotics Rash    Under arm and thighs   Family History  Problem Relation Age of Onset  . Heart disease Mother   . Osteoporosis Mother   . Cancer Father        prostate  . Osteoporosis Sister   . Gout Brother   . Gout Maternal Grandmother   . Heart disease Maternal Grandmother   . Alzheimer's disease Maternal Grandfather   . Osteoporosis Sister          Current Outpatient Medications (Other):  .  Biotin 10 MG TABS, Take 1 tablet by mouth daily.  .  Calcium Carb-Cholecalciferol (CALCIUM + D3 PO), Take 600 mg by mouth daily.  .  Cholecalciferol (VITAMIN D-3) 1000 units CAPS, Take 5,000 Units by mouth.  .  clindamycin (CLEOCIN) 100 MG vaginal suppository, Place 1 suppository (100 mg total) vaginally at bedtime. .  fluconazole (DIFLUCAN) 150 MG tablet, 1 tab today, then repeat in 3 days. .  Glucosamine 750 MG TABS, Take 1,200 mg by mouth daily.  .  metroNIDAZOLE (FLAGYL) 500 MG tablet, Take 1 tablet (500 mg total) by mouth 2 (two) times daily. .  Omega-3 Fatty Acids (FISH OIL) 1000 MG CAPS, Take 1 capsule by mouth daily.    Past medical history, social, surgical and family history all reviewed in electronic medical record.  No pertanent information unless stated regarding to the chief complaint.   Review of Systems:  No headache, visual changes, nausea, vomiting, diarrhea, constipation, dizziness, abdominal pain, skin rash, fevers, chills, night sweats, weight loss, swollen lymph nodes, , chest pain, shortness of breath, mood changes.  Positive muscle aches, body aches  Objective  Blood pressure (!) 90/52, pulse 80, height 5\' 6"  (1.676 m), weight 111 lb (50.3 kg), SpO2 97 %.    General: No apparent distress alert and oriented x3 mood and  affect normal, dressed appropriately.  HEENT: Pupils equal, extraocular movements intact  Respiratory: Patient's speak in full sentences and does not appear short of breath  Cardiovascular: No lower extremity edema, non tender, no erythema  Skin: Warm dry intact with no signs of infection or rash on extremities or on axial skeleton.  Abdomen: Soft nontender  Neuro: Cranial nerves II through XII are intact, neurovascularly intact in all extremities with 2+ DTRs and 2+ pulses.  Lymph: No lymphadenopathy of posterior or anterior cervical chain  or axillae bilaterally.  Gait normal with good balance and coordination.  MSK:  tender with full range of motion and good stability and symmetric strength and tone of shoulders, elbows, wrist, hip, knee and ankles bilaterally.  Moderate arthritic changes of multiple joints  Patient back exam has significant loss of lordosis with some degenerative scoliosis.  Severe tightness noted around the sacroiliac joints bilaterally.  The patient does have tender to palpation diffusely.  Straight leg test is tight but no radicular symptoms.  4 out of 5 strength in lower extremity but symmetric.  Osteopathic findings  T5 extended rotated and side bent left L2 flexed rotated and side bent right Sacrum right on right     Impression and Recommendations:     This case required medical decision making of moderate complexity. The above documentation has been reviewed and is accurate and complete Lyndal Pulley, DO       Note: This dictation was prepared with Dragon dictation along with smaller phrase technology. Any transcriptional errors that result from this process are unintentional.

## 2019-06-16 NOTE — Assessment & Plan Note (Signed)
Severe overall.  Worsening pain again.  Patient wants to hold on any type of epidural secondary to the coronavirus.  Continues to feel comfortable though with the osteopathic manipulation.  No change in medications.  Follow-up again in 4 weeks

## 2019-06-16 NOTE — Assessment & Plan Note (Signed)
Decision today to treat with OMT was based on Physical Exam  After verbal consent patient was treated with HVLA, ME, FPR techniques in  thoracic, lumbar and sacral areas  Patient tolerated the procedure well with improvement in symptoms  Patient given exercises, stretches and lifestyle modifications  See medications in patient instructions if given  Patient will follow up in 4 weeks 

## 2019-06-29 ENCOUNTER — Ambulatory Visit: Payer: Medicare Other | Attending: Internal Medicine

## 2019-06-29 DIAGNOSIS — Z20822 Contact with and (suspected) exposure to covid-19: Secondary | ICD-10-CM

## 2019-06-30 LAB — NOVEL CORONAVIRUS, NAA: SARS-CoV-2, NAA: NOT DETECTED

## 2019-07-07 ENCOUNTER — Encounter: Payer: Self-pay | Admitting: Family Medicine

## 2019-07-07 ENCOUNTER — Ambulatory Visit (INDEPENDENT_AMBULATORY_CARE_PROVIDER_SITE_OTHER): Payer: Medicare Other | Admitting: Family Medicine

## 2019-07-07 ENCOUNTER — Other Ambulatory Visit: Payer: Self-pay

## 2019-07-07 VITALS — BP 92/58 | HR 76 | Temp 98.0°F | Resp 16 | Ht 66.0 in | Wt 109.4 lb

## 2019-07-07 DIAGNOSIS — E785 Hyperlipidemia, unspecified: Secondary | ICD-10-CM | POA: Diagnosis not present

## 2019-07-07 DIAGNOSIS — M810 Age-related osteoporosis without current pathological fracture: Secondary | ICD-10-CM

## 2019-07-07 LAB — LIPID PANEL
Cholesterol: 283 mg/dL — ABNORMAL HIGH (ref 0–200)
HDL: 99.8 mg/dL (ref >39.00)
LDL Cholesterol: 166 mg/dL — ABNORMAL HIGH (ref 0–99)
NonHDL: 183.19
Total CHOL/HDL Ratio: 3
Triglycerides: 85 mg/dL (ref 0.0–149.0)
VLDL: 17 mg/dL (ref 0.0–40.0)

## 2019-07-07 LAB — VITAMIN D 25 HYDROXY (VIT D DEFICIENCY, FRACTURES): VITD: 19.59 ng/mL — ABNORMAL LOW (ref 30.00–100.00)

## 2019-07-07 MED ORDER — DENOSUMAB 60 MG/ML ~~LOC~~ SOSY
60.0000 mg | PREFILLED_SYRINGE | Freq: Once | SUBCUTANEOUS | Status: AC
  Administered 2019-07-07: 60 mg via SUBCUTANEOUS

## 2019-07-07 NOTE — Patient Instructions (Signed)
Great to see you today. Follow up in 6 months for prolia injection via nurse visit.    We will call you with your lab results.     Preventing High Cholesterol Cholesterol is a white, waxy substance similar to fat that the human body needs to help build cells. The liver makes all the cholesterol that a person's body needs. Having high cholesterol (hypercholesterolemia) increases a person's risk for heart disease and stroke. Extra (excess) cholesterol comes from the food the person eats. High cholesterol can often be prevented with diet and lifestyle changes. If you already have high cholesterol, you can control it with diet and lifestyle changes and with medicine. How can high cholesterol affect me? If you have high cholesterol, deposits (plaques) may build up on the walls of your arteries. The arteries are the blood vessels that carry blood away from your heart. Plaques make the arteries narrower and stiffer. This can limit or block blood flow and cause blood clots to form. Blood clots:  Are tiny balls of cells that form in your blood.  Can move to the heart or brain, causing a heart attack or stroke. Plaques in arteries greatly increase your risk for heart attack and stroke.Making diet and lifestyle changes can reduce your risk for these conditions that may threaten your life. What can increase my risk? This condition is more likely to develop in people who:  Eat foods that are high in saturated fat or cholesterol. Saturated fat is mostly found in: ? Foods that contain animal fat, such as red meat and some dairy products. ? Certain fatty foods made from plants, such as tropical oils.  Are overweight.  Are not getting enough exercise.  Have a family history of high cholesterol. What actions can I take to prevent this? Nutrition   Eat less saturated fat.  Avoid trans fats (partially hydrogenated oils). These are often found in margarine and in some baked goods, fried foods, and  snacks bought in packages.  Avoid precooked or cured meat, such as sausages or meat loaves.  Avoid foods and drinks that have added sugars.  Eat more fruits, vegetables, and whole grains.  Choose healthy sources of protein, such as fish, poultry, lean cuts of red meat, beans, peas, lentils, and nuts.  Choose healthy sources of fat, such as: ? Nuts. ? Vegetable oils, especially olive oil. ? Fish that have healthy fats (omega-3 fatty acids), such as mackerel or salmon. The items listed above may not be a complete list of recommended foods and beverages. Contact a dietitian for more information. Lifestyle  Lose weight if you are overweight. Losing 5-10 lb (2.3-4.5 kg) can help prevent or control high cholesterol. It can also lower your risk for diabetes and high blood pressure. Ask your health care provider to help you with a diet and exercise plan to lose weight safely.  Do not use any products that contain nicotine or tobacco, such as cigarettes, e-cigarettes, and chewing tobacco. If you need help quitting, ask your health care provider.  Limit your alcohol intake. ? Do not drink alcohol if:  Your health care provider tells you not to drink.  You are pregnant, may be pregnant, or are planning to become pregnant. ? If you drink alcohol:  Limit how much you use to:  0-1 drink a day for women.  0-2 drinks a day for men.  Be aware of how much alcohol is in your drink. In the [place] one drink equals one 12 oz bottle of  beer (355 mL), one 5 oz glass of wine (148 mL), or one 1 oz glass of hard liquor (44 mL). Activity   Get enough exercise. Each week, do at least 150 minutes of exercise that takes a medium level of effort (moderate-intensity exercise). ? This is exercise that:  Makes your heart beat faster and makes you breathe harder than usual.  Allows you to still be able to talk. ? You could exercise in short sessions several times a day or longer sessions a few times a week.  For example, on 5 days each week, you could walk fast or ride your bike 3 times a day for 10 minutes each time.  Do exercises as told by your health care provider. Medicines  In addition to diet and lifestyle changes, your health care provider may recommend medicines to help lower cholesterol. This may be a medicine to lower the amount of cholesterol your liver makes. You may need medicine if: ? Diet and lifestyle changes do not lower your cholesterol enough. ? You have high cholesterol and other risk factors for heart disease or stroke.  Take over-the-counter and prescription medicines only as told by your health care provider. General information  Manage your risk factors for high cholesterol. Talk with your health care provider about all your risk factors and how to lower your risk.  Manage other conditions that you have, such as diabetes or high blood pressure (hypertension).  Have blood tests to check your cholesterol levels at regular points in time as told by your health care provider.  Keep all follow-up visits as told by your health care provider. This is important. Where to find more information  American Heart Association: www.heart.org  National Heart, Lung, and Blood Institute: https://wilson-eaton.com/ Summary  High cholesterol increases your risk for heart disease and stroke. By keeping your cholesterol level low, you can reduce your risk for these conditions.  High cholesterol can often be prevented with diet and lifestyle changes.  Work with your health care provider to manage your risk factors, and have your blood tested regularly. This information is not intended to replace advice given to you by your health care provider. Make sure you discuss any questions you have with your health care provider. Document Released: 07/10/2015 Document Revised: 10/17/2018 Document Reviewed: 03/03/2016 Elsevier Patient Education  2020 [employer].

## 2019-07-07 NOTE — Progress Notes (Signed)
Kelsey [name] , Feb 11, 1946, 73 y.o., female MRN: [id number] Patient Care Team    Relationship Specialty Notifications Start End  [name], DO PCP - General Family Medicine  05/27/15   [name], MD Consulting Physician Gastroenterology  12/08/15   Calvert Cantor, MD Consulting Physician Ophthalmology  12/08/15   Lyndal Pulley, DO Consulting Physician Family Medicine  04/07/18     Chief Complaint  Patient presents with  . prolia injection    Pt is here for prolia injection   . Hyperlipidemia    Pt would like Lipid panel checked today      Subjective: Kelsey [name] is a 73 y.o. Pt presents for an OV for chronic medical condition follow-up today.  Osteoporosis/vit d def: Patient is due for her Prolia injection today.  She currently is not supplementing her vitamin D.  Last vitamin D check was June/19/2020 and was 29.  Hyperlipidemia: 12/26/2018 CV risk score 12.9%.  Patient declines starting statin at that time and wanted to work on her diet and exercise routine.  She states she has stopped using high saturated fat oils and started using all of oil.  She is trying to increase her exercise but does find it more difficult now with the pandemic.  Lipid Panel     Component Value Date/Time   CHOL 283 (H) 07/07/2019 0847   TRIG 85.0 07/07/2019 0847   HDL 99.80 07/07/2019 0847   CHOLHDL 3 07/07/2019 0847   VLDL 17.0 07/07/2019 0847   LDLCALC 166 (H) 07/07/2019 0847   LDLDIRECT 131.6 11/28/2012 0951     Depression screen PHQ 2/9 12/26/2018 04/07/2018 03/05/2018 11/06/2017 12/17/2016  Decreased Interest 0 0 0 0 0  Down, Depressed, Hopeless 0 0 0 0 0  PHQ - 2 Score 0 0 0 0 0    Allergies  Allergen Reactions  . Penicillins Rash    Has patient had a PCN reaction causing immediate rash, facial/tongue/throat swelling, SOB or lightheadedness with hypotension: no Has patient had a PCN reaction causing severe rash involving mucus membranes or skin necrosis: yes Has patient  had a PCN reaction that required hospitalization: no Has patient had a PCN reaction occurring within the last 10 years: no If all of the above answers are "NO", then may proceed with Cephalosporin use.   . Sulfa Antibiotics Rash    Under arm and thighs   Social History   Social History Narrative  . Not on file   Past Medical History:  Diagnosis Date  . Allergy   . Anemia    iron deficiency  . 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 Bladder prolapse, female, acquired   . Chicken pox as a child  . Dysuria 09/13/2011  . Female bladder prolapse 09/13/2011  . Fibrocystic breast 09/14/2011  . GERD (gastroesophageal reflux disease)   . H/O measles   . H/O mumps   . Hyperlipidemia   . 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 Osteoarthrosis    right shoulder  . Osteoporosis    reclast in Oct  . Ovarian cyst 09/14/2011  . RMSF [facility] spotted fever) 2017  . Situational anxiety 03/19/2014   R/t grieving process & caregiver burden for husband who has dementia, alzheimer's.   Marland Kitchen Uterine fibroid 11/2012   See Dr. Cletis Media note, surgery 11/2012  . Vaginitis 09/13/2011   Past Surgical History:  Procedure Laterality Date  . DILATATION & CURRETTAGE/HYSTEROSCOPY WITH RESECTOCOPE N/A 11/06/2012   Procedure: DILATATION & CURETTAGE/HYSTEROSCOPY WITH RESECTOCOPE;  Surgeon:  Alwyn Pea, MD;  Location: [facility];  Service: Gynecology;  Laterality: N/A;  . HERNIA REPAIR  1992  . small intestine hernia  1994  . WISDOM TOOTH EXTRACTION     Family History  Problem Relation Age of Onset  . Heart disease Mother   . Osteoporosis Mother   . Cancer Father        prostate  . Osteoporosis Sister   . Gout Brother   . Gout Maternal Grandmother   . Heart disease Maternal Grandmother   . Alzheimer's disease Maternal Grandfather   . Osteoporosis Sister    Allergies as of 07/07/2019      Reactions   Penicillins Rash   Has patient had a PCN reaction causing immediate rash, facial/tongue/throat swelling, SOB or  lightheadedness with hypotension: no Has patient had a PCN reaction causing severe rash involving mucus membranes or skin necrosis: yes Has patient had a PCN reaction that required hospitalization: no Has patient had a PCN reaction occurring within the last 10 years: no If all of the above answers are "NO", then may proceed with Cephalosporin use.   Sulfa Antibiotics Rash   Under arm and thighs      Medication List       Accurate as of July 07, 2019 11:59 PM. If you have any questions, ask your nurse or doctor.        STOP taking these medications   clindamycin 100 MG vaginal suppository Commonly known as: CLEOCIN Stopped by: Howard Pouch, DO   fluconazole 150 MG tablet Commonly known as: DIFLUCAN Stopped by: Howard Pouch, DO   metroNIDAZOLE 500 MG tablet Commonly known as: FLAGYL Stopped by: Howard Pouch, DO   Vitamin D-3 25 MCG (1000 UT) Caps Stopped by: Howard Pouch, DO     TAKE these medications   Biotin 10 MG Tabs Take 1 tablet by mouth daily.   CALCIUM + D3 PO Take 600 mg by mouth daily.   Fish Oil 1000 MG Caps Take 1 capsule by mouth daily.   Glucosamine 750 MG Tabs Take 1,200 mg by mouth daily.   omeprazole 20 MG capsule Commonly known as: PRILOSEC omeprazole 20 mg capsule,delayed release       All past medical history, surgical history, allergies, family history, immunizations andmedications were updated in the EMR today and reviewed under the history and medication portions of their EMR.     ROS: Negative, with the exception of above mentioned in HPI   Objective:  BP (!) 92/58 (BP Location: Left Arm, Patient Position: Sitting, Cuff Size: Normal)   Pulse 76   Temp 98 F (36.7 C) (Temporal)   Resp 16   Ht 5\' 6"  (1.676 m)   Wt 109 lb 6 oz (49.6 kg)   SpO2 99%   BMI 17.65 kg/m  Body mass index is 17.65 kg/m. Gen: Afebrile. No acute distress.  Thin [place] female. HENT: AT. [place].  Eyes:Pupils Equal Round Reactive to light, Extraocular  movements intact,  Conjunctiva without redness, discharge or icterus. CV: RRR Chest: CTAB, no wheeze or crackles Neuro:  Normal gait. PERLA. EOMi. Alert. Oriented x3  No exam data present No results found. No results found for this or any previous visit (from the past 24 hour(s)).  Assessment/Plan: HALEA DANIELSEN is a 73 y.o. female present for OV for  Dyslipidemia -Patient declines statin, but is willing to consider if cholesterol remains high despite the changes she had made.   -Lipid panel collected today. -Diet and exercise  changes discussed - Her HDL is great and certainly is cardioprotective. LDL elevated 144--> 12.5% CV risk.  - if still elevated will prescribe statin and follow-up in 3-4 months.  Osteoporosis without current pathological fracture, unspecified osteoporosis type/Vitamin D deficiency -Encouraged her to restart  vit d supplementation.  We will recheck levels today. - prolia provided today- f/u 6 mos (nurse visit if just prolia)  Bacterial vaginal infection/Vaginal yeast infection -doing better.  Still has mild complaints.  Encouraged her if she continues to have discharge or any irritation would recommend she establish with gynecology.  Patient reports understanding.    Reviewed expectations re: course of current medical issues.  Discussed self-management of symptoms.  Outlined signs and symptoms indicating need for more acute intervention.  Patient verbalized understanding and all questions were answered.  Patient received an After-Visit Summary.    Orders Placed This Encounter  Procedures  . Lipid panel  . Vitamin D (25 hydroxy)     Note is dictated utilizing voice recognition software. Although note has been proof read prior to signing, occasional typographical errors still can be missed. If any questions arise, please do not hesitate to call for verification.   electronically signed by:  Howard Pouch, DO  [place]

## 2019-07-08 ENCOUNTER — Telehealth: Payer: Self-pay | Admitting: Family Medicine

## 2019-07-08 MED ORDER — ATORVASTATIN CALCIUM 10 MG PO TABS: 10.0000 mg | ORAL_TABLET | Freq: Every day | ORAL | 3 refills | Status: DC

## 2019-07-08 MED ORDER — VITAMIN D (ERGOCALCIFEROL) 1.25 MG (50000 UNIT) PO CAPS: 50000.0000 [IU] | ORAL_CAPSULE | ORAL | 0 refills | Status: DC

## 2019-07-08 NOTE — Telephone Encounter (Signed)
Please call patient: Her vitamin D is again lower than desired.  Please ask her how much vitamin D total she is taking a day.  Her vitamin D level was 19.5 needs to be around 40.  Therefore we need to increase her vitamin D consumption.  Once we find out what she is currently taking, we will call her back and advise her on how much needs to be added.  In the meantime she is to start the once weekly high-dose vitamin D with food.  I have called this in for her.  She has multiple pharmacies listed I sent to the [facility] in [place] on [address].  As far as her cholesterol panel results, her good cholesterol/HDL is excellent again at 99.  Her triglycerides are excellent at 85.  However her LDL still continues to rise now at 166.  With her LDL over 166, I would recommend a low-dose statin to help provide her cardiovascular protection and bring down her cholesterol as well.  Especially with her family history of heart disease this will help decreases her chances of heart attack or stroke.  Of course she can continue exercising routinely and dietary modification such as the Mediterranean diet with lower saturated fats.  She is agreeable to statin use please advise and I will call in a very low dose to her pharmacy to start.

## 2019-07-08 NOTE — Telephone Encounter (Signed)
Please make sure patient has follow-up in 3 months with provider to recheck her vitamin D and cholesterol levels.

## 2019-07-08 NOTE — Telephone Encounter (Signed)
Pt has not been taking any Vit D, per Dr [name] she is to take 1,000 units everyday but the day she takes the high dose Vit D. She is to continue the 1,000 units daily after completing the high dose. She agreed to start statin medication. [facility].

## 2019-07-08 NOTE — Addendum Note (Signed)
Addended by: Howard Pouch A on: 07/08/2019 04:56 PM   Modules accepted: Orders

## 2019-07-11 ENCOUNTER — Encounter: Payer: Self-pay | Admitting: Family Medicine

## 2019-07-14 NOTE — Telephone Encounter (Signed)
Pt was called and scheduled for 3 month appt

## 2019-07-16 ENCOUNTER — Ambulatory Visit
Admission: RE | Admit: 2019-07-16 | Discharge: 2019-07-16 | Disposition: A | Discharge status: Home / Self Care | Payer: Medicare PPO | Source: Ambulatory Visit | Attending: Family Medicine | Admitting: Family Medicine

## 2019-07-16 ENCOUNTER — Other Ambulatory Visit: Payer: Self-pay

## 2019-07-16 DIAGNOSIS — M545 Low back pain, unspecified: Secondary | ICD-10-CM

## 2019-07-16 DIAGNOSIS — G8929 Other chronic pain: Secondary | ICD-10-CM

## 2019-07-16 MED ORDER — IOPAMIDOL (ISOVUE-M 200) INJECTION 41%
1.0000 mL | Freq: Once | INTRAMUSCULAR | Status: AC
  Administered 2019-07-16: 1 mL via EPIDURAL

## 2019-07-16 MED ORDER — METHYLPREDNISOLONE ACETATE 40 MG/ML INJ SUSP (RADIOLOG
120.0000 mg | Freq: Once | INTRAMUSCULAR | Status: AC
  Administered 2019-07-16: 120 mg via EPIDURAL

## 2019-07-16 NOTE — Discharge Instructions (Signed)

## 2019-07-21 ENCOUNTER — Ambulatory Visit: Payer: Medicare Other | Admitting: Family Medicine

## 2019-07-21 NOTE — Progress Notes (Deleted)
[place] Phone: [phone] Subjective:    I'm seeing this patient by the request  of:    CC:   [id number]  Kelsey [name] is a 74 y.o. female coming in with complaint of back pain. Last seen on 06/16/2019 for OMT. Patient states        Past Medical History:  Diagnosis Date  . Allergy   . Anemia    iron deficiency  . 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 Bladder prolapse, female, acquired   . Chicken pox as a child  . Dysuria 09/13/2011  . Female bladder prolapse 09/13/2011  . Fibrocystic breast 09/14/2011  . GERD (gastroesophageal reflux disease)   . H/O measles   . H/O mumps   . Hyperlipidemia   . 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 Osteoarthrosis    right shoulder  . Osteoporosis    reclast in Oct  . Ovarian cyst 09/14/2011  . [facility] spotted fever) 2017  . Situational anxiety 03/19/2014   R/t grieving process & caregiver burden for husband who has dementia, alzheimer's.   Marland Kitchen Uterine fibroid 11/2012   See Dr. Cletis Media note, surgery 11/2012  . Vaginitis 09/13/2011   Past Surgical History:  Procedure Laterality Date  . DILATATION & CURRETTAGE/HYSTEROSCOPY WITH RESECTOCOPE N/A 11/06/2012   Procedure: DILATATION & CURETTAGE/HYSTEROSCOPY WITH RESECTOCOPE;  Surgeon: Alwyn Pea, MD;  Location: [facility];  Service: Gynecology;  Laterality: N/A;  . HERNIA REPAIR  1992  . small intestine hernia  1994  . WISDOM TOOTH EXTRACTION     Social History   Socioeconomic History  . Marital status: Married    Spouse name: Not on file  . Number of children: Not on file  . Years of education: Not on file  . Highest education level: Not on file  Occupational History  . Not on file  Tobacco Use  . Smoking status: Former Smoker    Packs/day: 0.30    Years: 10.00    Pack years: 3.00    Types: Cigarettes    Quit date: 07/09/1978    Years since quitting: 41.0  . Smokeless tobacco: Never Used  Substance and  Sexual Activity  . Alcohol use: Yes    Comment: 1-2 glasses of wine daily and a 12 oz beer daily  . Drug use: No  . Sexual activity: Never  Other Topics Concern  . Not on file  Social History Narrative  . Not on file   Social Determinants of Health   Financial Resource Strain:   . Difficulty of Paying Living Expenses: Not on file  Food Insecurity:   . Worried About Charity fundraiser in the Last Year: Not on file  . Ran Out of Food in the Last Year: Not on file  Transportation Needs:   . Lack of Transportation (Medical): Not on file  . Lack of Transportation (Non-Medical): Not on file  Physical Activity:   . Days of Exercise per Week: Not on file  . Minutes of Exercise per Session: Not on file  Stress:   . Feeling of Stress : Not on file  Social Connections:   . Frequency of Communication with Friends and Family: Not on file  . Frequency of Social Gatherings with Friends and Family: Not on file  . Attends Religious Services: Not on file  . Active Member of Clubs or Organizations: Not on file  . Attends Archivist Meetings: Not on file  .  Marital Status: Not on file   Allergies  Allergen Reactions  . Penicillins Rash    Has patient had a PCN reaction causing immediate rash, facial/tongue/throat swelling, SOB or lightheadedness with hypotension: no Has patient had a PCN reaction causing severe rash involving mucus membranes or skin necrosis: yes Has patient had a PCN reaction that required hospitalization: no Has patient had a PCN reaction occurring within the last 10 years: no If all of the above answers are "NO", then may proceed with Cephalosporin use.   . Sulfa Antibiotics Rash    Under arm and thighs   Family History  Problem Relation Age of Onset  . Heart disease Mother   . Osteoporosis Mother   . Cancer Father        prostate  . Osteoporosis Sister   . Gout Brother   . Gout Maternal Grandmother   . Heart disease Maternal Grandmother   .  Alzheimer's disease Maternal Grandfather   . Osteoporosis Sister      Current Outpatient Medications (Cardiovascular):  .  atorvastatin (LIPITOR) 10 MG tablet, Take 1 tablet (10 mg total) by mouth daily.     Current Outpatient Medications (Other):  .  Biotin 10 MG TABS, Take 1 tablet by mouth daily.  .  Calcium Carb-Cholecalciferol (CALCIUM + D3 PO), Take 600 mg by mouth daily.  .  Glucosamine 750 MG TABS, Take 1,200 mg by mouth daily.  .  Omega-3 Fatty Acids (FISH OIL) 1000 MG CAPS, Take 1 capsule by mouth daily. Marland Kitchen  omeprazole (PRILOSEC) 20 MG capsule, omeprazole 20 mg capsule,delayed release .  Vitamin D, Ergocalciferol, (DRISDOL) 1.25 MG (50000 UT) CAPS capsule, Take 1 capsule (50,000 Units total) by mouth every 7 (seven) days.    Past medical history, social, surgical and family history all reviewed in electronic medical record.  No pertanent information unless stated regarding to the chief complaint.   Review of Systems:  No headache, visual changes, nausea, vomiting, diarrhea, constipation, dizziness, abdominal pain, skin rash, fevers, chills, night sweats, weight loss, swollen lymph nodes, body aches, joint swelling, muscle aches, chest pain, shortness of breath, mood changes.   Objective  There were no vitals taken for this visit. Systems examined below as of    General: No apparent distress alert and oriented x3 mood and affect normal, dressed appropriately.  HEENT: Pupils equal, extraocular movements intact  Respiratory: Patient's speak in full sentences and does not appear short of breath  Cardiovascular: No lower extremity edema, non tender, no erythema  Skin: Warm dry intact with no signs of infection or rash on extremities or on axial skeleton.  Abdomen: Soft nontender  Neuro: Cranial nerves II through XII are intact, neurovascularly intact in all extremities with 2+ DTRs and 2+ pulses.  Lymph: No lymphadenopathy of posterior or anterior cervical chain or axillae  bilaterally.  Gait normal with good balance and coordination.  MSK:  Non tender with full range of motion and good stability and symmetric strength and tone of shoulders, elbows, wrist, hip, knee and ankles bilaterally.     Impression and Recommendations:     This case required medical decision making of moderate complexity. The above documentation has been reviewed and is accurate and complete [name]       Note: This dictation was prepared with Dragon dictation along with smaller phrase technology. Any transcriptional errors that result from this process are unintentional.

## 2019-07-28 ENCOUNTER — Ambulatory Visit (INDEPENDENT_AMBULATORY_CARE_PROVIDER_SITE_OTHER): Payer: Medicare PPO | Admitting: Family Medicine

## 2019-07-28 ENCOUNTER — Other Ambulatory Visit: Payer: Self-pay

## 2019-07-28 ENCOUNTER — Encounter: Payer: Self-pay | Admitting: Family Medicine

## 2019-07-28 VITALS — BP 106/70 | HR 78 | Ht 66.0 in | Wt 108.0 lb

## 2019-07-28 DIAGNOSIS — M48061 Spinal stenosis, lumbar region without neurogenic claudication: Secondary | ICD-10-CM | POA: Diagnosis not present

## 2019-07-28 DIAGNOSIS — M999 Biomechanical lesion, unspecified: Secondary | ICD-10-CM | POA: Diagnosis not present

## 2019-07-28 NOTE — Assessment & Plan Note (Signed)
Patient did have a repeat epidural.  Doing somewhat better at this moment.  Not quite as much improvement as she did have previously.  Discussed icing regimen and home exercise, which activities to do which wants to avoid.  Patient responding well to manipulation.  Follow-up again 6 weeks

## 2019-07-28 NOTE — Patient Instructions (Signed)
Less carbs at night More omega 3 less omega 6 See me again in 5-6 weeks

## 2019-07-28 NOTE — Assessment & Plan Note (Signed)
Decision today to treat with OMT was based on Physical Exam  After verbal consent patient was treated with HVLA, ME, FPR techniques in  thoracic, lumbar and sacral areas  Patient tolerated the procedure well with improvement in symptoms  Patient given exercises, stretches and lifestyle modifications  See medications in patient instructions if given  Patient will follow up in 4-8 weeks 

## 2019-07-28 NOTE — Progress Notes (Signed)
[place] Phone: [phone] Subjective:   [name], am serving as a scribe for Dr. [name]. This visit occurred during the SARS-CoV-2 public health emergency.  Safety protocols were in place, including screening questions prior to the visit, additional usage of staff PPE, and extensive cleaning of exam room while observing appropriate contact time as indicated for disinfecting solutions.   I'm seeing this patient by the request  of:  [name], [name], DO  CC: Low back pain follow-up  [id number]  Kelsey [name] is a 74 y.o. female coming in with complaint of back pain. Last seen on 06/16/2019 for OMT. Patient states that she had epidural on 07/16/2019. Did have some relief from this epidural but is disappointed that she did not have as much relief as first injection.  Patient still states that it is approximately 65% better in the bladder prior to the injection.  Has noticed that she is walking considerably straighter.  Patient is not having the pain that wakes her up at night anymore.  States going from a sitting to standing position has also been helpful      Past Medical History:  Diagnosis Date  . Allergy   . Anemia    iron deficiency  . 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 Bladder prolapse, female, acquired   . Chicken pox as a child  . Dysuria 09/13/2011  . Female bladder prolapse 09/13/2011  . Fibrocystic breast 09/14/2011  . GERD (gastroesophageal reflux disease)   . H/O measles   . H/O mumps   . Hyperlipidemia   . 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 Osteoarthrosis    right shoulder  . Osteoporosis    reclast in Oct  . Ovarian cyst 09/14/2011  . [facility] spotted fever) 2017  . Situational anxiety 03/19/2014   R/t grieving process & caregiver burden for husband who has dementia, alzheimer's.   Marland Kitchen Uterine fibroid 11/2012   See Dr. Cletis Media note, surgery 11/2012  . Vaginitis 09/13/2011   Past  Surgical History:  Procedure Laterality Date  . DILATATION & CURRETTAGE/HYSTEROSCOPY WITH RESECTOCOPE N/A 11/06/2012   Procedure: DILATATION & CURETTAGE/HYSTEROSCOPY WITH RESECTOCOPE;  Surgeon: Alwyn Pea, MD;  Location: [facility];  Service: Gynecology;  Laterality: N/A;  . HERNIA REPAIR  1992  . small intestine hernia  1994  . WISDOM TOOTH EXTRACTION     Social History   Socioeconomic History  . Marital status: Married    Spouse name: Not on file  . Number of children: Not on file  . Years of education: Not on file  . Highest education level: Not on file  Occupational History  . Not on file  Tobacco Use  . Smoking status: Former Smoker    Packs/day: 0.30    Years: 10.00    Pack years: 3.00    Types: Cigarettes    Quit date: 07/09/1978    Years since quitting: 41.0  . Smokeless tobacco: Never Used  Substance and Sexual Activity  . Alcohol use: Yes    Comment: 1-2 glasses of wine daily and a 12 oz beer daily  . Drug use: No  . Sexual activity: Never  Other Topics Concern  . Not on file  Social History Narrative  . Not on file   Social Determinants of Health   Financial Resource Strain:   . Difficulty of Paying Living Expenses: Not on file  Food Insecurity:   . Worried About Running  Out of Food in the Last Year: Not on file  . Ran Out of Food in the Last Year: Not on file  Transportation Needs:   . Lack of Transportation (Medical): Not on file  . Lack of Transportation (Non-Medical): Not on file  Physical Activity:   . Days of Exercise per Week: Not on file  . Minutes of Exercise per Session: Not on file  Stress:   . Feeling of Stress : Not on file  Social Connections:   . Frequency of Communication with Friends and Family: Not on file  . Frequency of Social Gatherings with Friends and Family: Not on file  . Attends Religious Services: Not on file  . Active Member of Clubs or Organizations: Not on file  . Attends Archivist Meetings: Not on file  .  Marital Status: Not on file   Allergies  Allergen Reactions  . Penicillins Rash    Has patient had a PCN reaction causing immediate rash, facial/tongue/throat swelling, SOB or lightheadedness with hypotension: no Has patient had a PCN reaction causing severe rash involving mucus membranes or skin necrosis: yes Has patient had a PCN reaction that required hospitalization: no Has patient had a PCN reaction occurring within the last 10 years: no If all of the above answers are "NO", then may proceed with Cephalosporin use.   . Sulfa Antibiotics Rash    Under arm and thighs   Family History  Problem Relation Age of Onset  . Heart disease Mother   . Osteoporosis Mother   . Cancer Father        prostate  . Osteoporosis Sister   . Gout Brother   . Gout Maternal Grandmother   . Heart disease Maternal Grandmother   . Alzheimer's disease Maternal Grandfather   . Osteoporosis Sister      Current Outpatient Medications (Cardiovascular):  .  atorvastatin (LIPITOR) 10 MG tablet, Take 1 tablet (10 mg total) by mouth daily.     Current Outpatient Medications (Other):  .  Biotin 10 MG TABS, Take 1 tablet by mouth daily.  .  Calcium Carb-Cholecalciferol (CALCIUM + D3 PO), Take 600 mg by mouth daily.  .  Glucosamine 750 MG TABS, Take 1,200 mg by mouth daily.  .  Omega-3 Fatty Acids (FISH OIL) 1000 MG CAPS, Take 1 capsule by mouth daily. Marland Kitchen  omeprazole (PRILOSEC) 20 MG capsule, omeprazole 20 mg capsule,delayed release .  Vitamin D, Ergocalciferol, (DRISDOL) 1.25 MG (50000 UT) CAPS capsule, Take 1 capsule (50,000 Units total) by mouth every 7 (seven) days.    Past medical history, social, surgical and family history all reviewed in electronic medical record.  No pertanent information unless stated regarding to the chief complaint.   Review of Systems:  No headache, visual changes, nausea, vomiting, diarrhea, constipation, dizziness, abdominal pain, skin rash, fevers, chills, night sweats,  weight loss, swollen lymph nodes, body aches, joint swelling, chest pain, shortness of breath, mood changes. POSITIVE muscle aches  Objective  Blood pressure 106/70, pulse 78, height 5\' 6"  (1.676 m), weight 108 lb (49 kg).   General: No apparent distress alert and oriented x3 mood and affect normal, dressed appropriately.  HEENT: Pupils equal, extraocular movements intact  Respiratory: Patient's speak in full sentences and does not appear short of breath  Cardiovascular: No lower extremity edema, non tender, no erythema  Skin: Warm dry intact with no signs of infection or rash on extremities or on axial skeleton.  Abdomen: Soft nontender  Neuro: Cranial nerves  II through XII are intact, neurovascularly intact in all extremities with 2+ DTRs and 2+ pulses.  Lymph: No lymphadenopathy of posterior or anterior cervical chain or axillae bilaterally.  Gait normal with good balance and coordination.  MSK: Significant arthritic changes of multiple joints Back exam does have some loss of lordosis with some degenerative scoliosis.  Tightness noted with Corky Sox test bilaterally.  Tightness with straight leg test right greater than left as well.    Osteopathic findings   T9 extended rotated and side bent left L2 flexed rotated and side bent right L4 3 flexed rotated and side bent left Sacrum right on right    Impression and Recommendations:     This case required medical decision making of moderate complexity. The above documentation has been reviewed and is accurate and complete Lyndal Pulley, DO       Note: This dictation was prepared with Dragon dictation along with smaller phrase technology. Any transcriptional errors that result from this process are unintentional.

## 2019-08-18 DIAGNOSIS — H04123 Dry eye syndrome of bilateral lacrimal glands: Secondary | ICD-10-CM | POA: Diagnosis not present

## 2019-08-30 NOTE — Assessment & Plan Note (Signed)
Decision today to treat with OMT was based on Physical Exam  After verbal consent patient was treated with HVLA, ME, FPR techniques in  thoracic, lumbar and sacral areas  Patient tolerated the procedure well with improvement in symptoms  Patient given exercises, stretches and lifestyle modifications  See medications in patient instructions if given  Patient will follow up in 4-8 weeks 

## 2019-08-30 NOTE — Progress Notes (Signed)
[place] Phone: [phone] Subjective:   [name], am serving as a scribe for Dr. [name]. This visit occurred during the SARS-CoV-2 public health emergency.  Safety protocols were in place, including screening questions prior to the visit, additional usage of staff PPE, and extensive cleaning of exam room while observing appropriate contact time as indicated for disinfecting solutions.   I'm seeing this patient by the request  of:  [name], [name], DO  CC: Low back pain follow-up  [id number]  Kelsey [name] is a 74 y.o. female coming in with complaint of back pain. Last seen on 07/28/2019 for OMT. Patient states that she has been having pain in the lower back due to squatting down to light kerosene heater.  Patient states that some more tightness recently.  Denies any radiation down the legs, patient feels that the cold weather has been contributing.       Past Medical History:  Diagnosis Date  . Allergy   . Anemia    iron deficiency  . 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 Bladder prolapse, female, acquired   . Chicken pox as a child  . Dysuria 09/13/2011  . Female bladder prolapse 09/13/2011  . Fibrocystic breast 09/14/2011  . GERD (gastroesophageal reflux disease)   . H/O measles   . H/O mumps   . Hyperlipidemia   . 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 Osteoarthrosis    right shoulder  . Osteoporosis    reclast in Oct  . Ovarian cyst 09/14/2011  . 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 Situational anxiety 03/19/2014   R/t grieving process & caregiver burden for husband who has dementia, alzheimer's.   Marland Kitchen Uterine fibroid 11/2012   See Dr. Cletis Media note, surgery 11/2012  . Vaginitis 09/13/2011   Past Surgical History:  Procedure Laterality Date  . DILATATION & CURRETTAGE/HYSTEROSCOPY WITH RESECTOCOPE N/A 11/06/2012   Procedure: DILATATION & CURETTAGE/HYSTEROSCOPY WITH RESECTOCOPE;  Surgeon: Alwyn Pea, MD;  Location: [facility];  Service: Gynecology;  Laterality: N/A;  . HERNIA REPAIR  1992  . small intestine hernia  1994  . WISDOM TOOTH EXTRACTION     Social History   Socioeconomic History  . Marital status: Married    Spouse name: Not on file  . Number of children: Not on file  . Years of education: Not on file  . Highest education level: Not on file  Occupational History  . Not on file  Tobacco Use  . Smoking status: Former Smoker    Packs/day: 0.30    Years: 10.00    Pack years: 3.00    Types: Cigarettes    Quit date: 07/09/1978    Years since quitting: 41.1  . Smokeless tobacco: Never Used  Substance and Sexual Activity  . Alcohol use: Yes    Comment: 1-2 glasses of wine daily and a 12 oz beer daily  . Drug use: No  . Sexual activity: Never  Other Topics Concern  . Not on file  Social History Narrative  . Not on file   Social Determinants of Health   Financial Resource Strain:   . Difficulty of Paying Living Expenses: Not on file  Food Insecurity:   . Worried About Charity fundraiser in the Last Year: Not on file  . Ran Out of Food in the Last Year: Not on file  Transportation Needs:   . Lack of Transportation (Medical): Not on file  .  Lack of Transportation (Non-Medical): Not on file  Physical Activity:   . Days of Exercise per Week: Not on file  . Minutes of Exercise per Session: Not on file  Stress:   . Feeling of Stress : Not on file  Social Connections:   . Frequency of Communication with Friends and Family: Not on file  . Frequency of Social Gatherings with Friends and Family: Not on file  . Attends Religious Services: Not on file  . Active Member of Clubs or Organizations: Not on file  . Attends Archivist Meetings: Not on file  . Marital Status: Not on file   Allergies  Allergen Reactions  . Penicillins Rash    Has patient had a PCN reaction causing immediate rash, facial/tongue/throat swelling, SOB or lightheadedness with  hypotension: no Has patient had a PCN reaction causing severe rash involving mucus membranes or skin necrosis: yes Has patient had a PCN reaction that required hospitalization: no Has patient had a PCN reaction occurring within the last 10 years: no If all of the above answers are "NO", then may proceed with Cephalosporin use.   . Sulfa Antibiotics Rash    Under arm and thighs   Family History  Problem Relation Age of Onset  . Heart disease Mother   . Osteoporosis Mother   . Cancer Father        prostate  . Osteoporosis Sister   . Gout Brother   . Gout Maternal Grandmother   . Heart disease Maternal Grandmother   . Alzheimer's disease Maternal Grandfather   . Osteoporosis Sister      Current Outpatient Medications (Cardiovascular):  .  atorvastatin (LIPITOR) 10 MG tablet, Take 1 tablet (10 mg total) by mouth daily.     Current Outpatient Medications (Other):  .  Biotin 10 MG TABS, Take 1 tablet by mouth daily.  .  Calcium Carb-Cholecalciferol (CALCIUM + D3 PO), Take 600 mg by mouth daily.  .  Glucosamine 750 MG TABS, Take 1,200 mg by mouth daily.  .  Omega-3 Fatty Acids (FISH OIL) 1000 MG CAPS, Take 1 capsule by mouth daily. Marland Kitchen  omeprazole (PRILOSEC) 20 MG capsule, omeprazole 20 mg capsule,delayed release .  Vitamin D, Ergocalciferol, (DRISDOL) 1.25 MG (50000 UT) CAPS capsule, Take 1 capsule (50,000 Units total) by mouth every 7 (seven) days.   Reviewed prior external information including notes and imaging from  primary care provider As well as notes that were available from care everywhere and other healthcare systems.  Past medical history, social, surgical and family history all reviewed in electronic medical record.  No pertanent information unless stated regarding to the chief complaint.   Review of Systems:  No headache, visual changes, nausea, vomiting, diarrhea, constipation, dizziness, abdominal pain, skin rash, fevers, chills, night sweats, weight loss, swollen  lymph nodes, body aches, joint swelling, chest pain, shortness of breath, mood changes. POSITIVE muscle aches  Objective  There were no vitals taken for this visit.   General: No apparent distress alert and oriented x3 mood and affect normal, dressed appropriately.  HEENT: Pupils equal, extraocular movements intact  Respiratory: Patient's speak in full sentences and does not appear short of breath  Cardiovascular: No lower extremity edema, non tender, no erythema  Skin: Warm dry intact with no signs of infection or rash on extremities or on axial skeleton.  Abdomen: Soft nontender  Neuro: Cranial nerves II through XII are intact, neurovascularly intact in all extremities with 2+ DTRs and 2+ pulses.  Lymph:  No lymphadenopathy of posterior or anterior cervical chain or axillae bilaterally.  Gait normal with good balance and coordination.  MSK:  Non tender with full range of motion and good stability and symmetric strength and tone of shoulders, elbows, wrist, hip, knee and ankles bilaterally.  Low back does have some loss of lordosis with degenerative scoliosis.  Significant stiffness with loss of range of motion in all planes.  Tightness noted in straight leg test but no radicular symptoms.  Worsening pain with extension.  Neurovascular intact distally.  4+ out of 5 strength but symmetric  Osteopathic findings  T5 extended rotated and side bent left L2 flexed rotated and side bent right L4 flexed rotated and side bent left Sacrum right on right    Impression and Recommendations:     This case required medical decision making of moderate complexity. The above documentation has been reviewed and is accurate and complete Lyndal Pulley, DO       Note: This dictation was prepared with Dragon dictation along with smaller phrase technology. Any transcriptional errors that result from this process are unintentional.

## 2019-08-30 NOTE — Assessment & Plan Note (Signed)
Chronic problem with exacerbation.  Discussed home exercises, icing regimen, discussed medical management.  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  This is difficult with patient also trying to protect herself during the coronavirus outbreak.  Follow-up with me again in 4 to 8 weeks

## 2019-09-02 ENCOUNTER — Ambulatory Visit (INDEPENDENT_AMBULATORY_CARE_PROVIDER_SITE_OTHER): Payer: Medicare PPO | Admitting: Family Medicine

## 2019-09-02 ENCOUNTER — Encounter: Payer: Self-pay | Admitting: Family Medicine

## 2019-09-02 ENCOUNTER — Other Ambulatory Visit: Payer: Self-pay

## 2019-09-02 VITALS — BP 104/62 | HR 78 | Ht 66.0 in | Wt 110.0 lb

## 2019-09-02 DIAGNOSIS — M48061 Spinal stenosis, lumbar region without neurogenic claudication: Secondary | ICD-10-CM

## 2019-09-02 DIAGNOSIS — M999 Biomechanical lesion, unspecified: Secondary | ICD-10-CM

## 2019-09-02 NOTE — Patient Instructions (Signed)
Bundle up in the house  See me in 4 -6 weeks

## 2019-10-05 ENCOUNTER — Other Ambulatory Visit: Payer: Self-pay

## 2019-10-05 ENCOUNTER — Encounter: Payer: Self-pay | Admitting: Family Medicine

## 2019-10-05 ENCOUNTER — Ambulatory Visit (INDEPENDENT_AMBULATORY_CARE_PROVIDER_SITE_OTHER): Payer: Medicare PPO | Admitting: Family Medicine

## 2019-10-05 VITALS — BP 98/62 | HR 83 | Ht 66.0 in | Wt 108.0 lb

## 2019-10-05 DIAGNOSIS — M999 Biomechanical lesion, unspecified: Secondary | ICD-10-CM

## 2019-10-05 DIAGNOSIS — M48061 Spinal stenosis, lumbar region without neurogenic claudication: Secondary | ICD-10-CM | POA: Diagnosis not present

## 2019-10-05 NOTE — Patient Instructions (Signed)
See me again in 4 weeks  

## 2019-10-05 NOTE — Assessment & Plan Note (Signed)
Chronic problem : Exacerbation noted mild worsening  interventions previously, including medication management: Osteopathic manipulation, anti-inflammatories, vitamin D.  Discussed medication management in greater detail as well as the possibility of starting other medicines such as gabapentin    Interventions this visit: Discussed medication management, continued osteopathic manipulation, discussed core stability We discussed with patient the importance ergonomics, home exercises, icing regimen, and over-the-counter natural products.   Future considerations but will be based on evaluation and next visit: Could potentially do well with epidural again if worsening pain  Social determinants of health includes patient being the primary caregiver for her ailing husband difficult to do exercises and discussed lifting mechanics.  He is on hospice at this time    Return to clinic: 4 weeks

## 2019-10-05 NOTE — Assessment & Plan Note (Signed)

## 2019-10-05 NOTE — Progress Notes (Signed)
[place] Phone: [phone] Subjective:   [name], am serving as a scribe for Dr. [name]. This visit occurred during the SARS-CoV-2 public health emergency.  Safety protocols were in place, including screening questions prior to the visit, additional usage of staff PPE, and extensive cleaning of exam room while observing appropriate contact time as indicated for disinfecting solutions.   I'm seeing this patient by the request  of:  [name], Kelsey A, DO  CC: Low back pain follow-up  [id number]  Kelsey [name] is a 74 y.o. female coming in with complaint of back pain. Last seen on 09/02/2019. Patient states that she continues to have right sided lower back pain. Feels like last epidural did not last as long. Patient had hard time standing up from seated position.  Patient states that she is starting to feel like it slowly getting worse again but even with the last manipulation does get some improvement for some time.  Patient's husband continues to get more ill and she is to do more with positioning.     Past Medical History:  Diagnosis Date  . Allergy   . Anemia    iron deficiency  . 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 Bladder prolapse, female, acquired   . Chicken pox as a child  . Dysuria 09/13/2011  . Female bladder prolapse 09/13/2011  . Fibrocystic breast 09/14/2011  . GERD (gastroesophageal reflux disease)   . H/O measles   . H/O mumps   . Hyperlipidemia   . 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 Osteoarthrosis    right shoulder  . Osteoporosis    reclast in Oct  . Ovarian cyst 09/14/2011  . RMSF [facility] spotted fever) 2017  . Situational anxiety 03/19/2014   R/t grieving process & caregiver burden for husband who has dementia, alzheimer's.   Marland Kitchen Uterine fibroid 11/2012   See Dr. Cletis Media note, surgery 11/2012  . Vaginitis 09/13/2011   Past Surgical History:  Procedure Laterality Date  .  DILATATION & CURRETTAGE/HYSTEROSCOPY WITH RESECTOCOPE N/A 11/06/2012   Procedure: DILATATION & CURETTAGE/HYSTEROSCOPY WITH RESECTOCOPE;  Surgeon: Alwyn Pea, MD;  Location: [facility];  Service: Gynecology;  Laterality: N/A;  . HERNIA REPAIR  1992  . small intestine hernia  1994  . WISDOM TOOTH EXTRACTION     Social History   Socioeconomic History  . Marital status: Married    Spouse name: Not on file  . Number of children: Not on file  . Years of education: Not on file  . Highest education level: Not on file  Occupational History  . Not on file  Tobacco Use  . Smoking status: Former Smoker    Packs/day: 0.30    Years: 10.00    Pack years: 3.00    Types: Cigarettes    Quit date: 07/09/1978    Years since quitting: 41.2  . Smokeless tobacco: Never Used  Substance and Sexual Activity  . Alcohol use: Yes    Comment: 1-2 glasses of wine daily and a 12 oz beer daily  . Drug use: No  . Sexual activity: Never  Other Topics Concern  . Not on file  Social History Narrative  . Not on file   Social Determinants of Health   Financial Resource Strain:   . Difficulty of Paying Living Expenses:   Food Insecurity:   . Worried About Charity fundraiser in the Last Year:   . [employer] of  Food in the Last Year:   Transportation Needs:   . Film/video editor (Medical):   Marland Kitchen Lack of Transportation (Non-Medical):   Physical Activity:   . Days of Exercise per Week:   . Minutes of Exercise per Session:   Stress:   . Feeling of Stress :   Social Connections:   . Frequency of Communication with Friends and Family:   . Frequency of Social Gatherings with Friends and Family:   . Attends Religious Services:   . Active Member of Clubs or Organizations:   . Attends Archivist Meetings:   Marland Kitchen Marital Status:    Allergies  Allergen Reactions  . Penicillins Rash    Has patient had a PCN reaction causing immediate rash, facial/tongue/throat swelling, SOB or lightheadedness with  hypotension: no Has patient had a PCN reaction causing severe rash involving mucus membranes or skin necrosis: yes Has patient had a PCN reaction that required hospitalization: no Has patient had a PCN reaction occurring within the last 10 years: no If all of the above answers are "NO", then may proceed with Cephalosporin use.   . Sulfa Antibiotics Rash    Under arm and thighs   Family History  Problem Relation Age of Onset  . Heart disease Mother   . Osteoporosis Mother   . Cancer Father        prostate  . Osteoporosis Sister   . Gout Brother   . Gout Maternal Grandmother   . Heart disease Maternal Grandmother   . Alzheimer's disease Maternal Grandfather   . Osteoporosis Sister      Current Outpatient Medications (Cardiovascular):  .  atorvastatin (LIPITOR) 10 MG tablet, Take 1 tablet (10 mg total) by mouth daily.     Current Outpatient Medications (Other):  .  Biotin 10 MG TABS, Take 1 tablet by mouth daily.  .  Calcium Carb-Cholecalciferol (CALCIUM + D3 PO), Take 600 mg by mouth daily.  .  Glucosamine 750 MG TABS, Take 1,200 mg by mouth daily.  .  Omega-3 Fatty Acids (FISH OIL) 1000 MG CAPS, Take 1 capsule by mouth daily. Marland Kitchen  omeprazole (PRILOSEC) 20 MG capsule, omeprazole 20 mg capsule,delayed release .  Vitamin D, Ergocalciferol, (DRISDOL) 1.25 MG (50000 UT) CAPS capsule, Take 1 capsule (50,000 Units total) by mouth every 7 (seven) days.   Reviewed prior external information including notes and imaging from  primary care provider As well as notes that were available from care everywhere and other healthcare systems.  Past medical history, social, surgical and family history all reviewed in electronic medical record.  No pertanent information unless stated regarding to the chief complaint.   Review of Systems:  No headache, visual changes, nausea, vomiting, diarrhea, constipation, dizziness, abdominal pain, skin rash, fevers, chills, night sweats, weight loss, swollen  lymph nodes, body aches, joint swelling, chest pain, shortness of breath, mood changes. POSITIVE muscle aches  Objective  Blood pressure 98/62, pulse 83, height 5\' 6"  (1.676 m), weight 108 lb (49 kg), SpO2 98 %.   General: No apparent distress alert and oriented x3 mood and affect normal, dressed appropriately.  HEENT: Pupils equal, extraocular movements intact  Respiratory: Patient's speak in full sentences and does not appear short of breath  Cardiovascular: No lower extremity edema, non tender, no erythema  Neuro: Cranial nerves II through XII are intact, neurovascularly intact in all extremities with 2+ DTRs and 2+ pulses.  Gait normal with good balance and coordination.  MSK:  Non tender with full  range of motion and good stability and symmetric strength and tone of shoulders, elbows, wrist, hip, knee and ankles bilaterally.  Back exam does have loss of lordosis and severely degenerative scoliosis.  Severe tenderness over the sacroiliac joints in the L5 area bilaterally.  No spinous process tenderness.  Patient does have some tightness with straight leg test as well as with FABER test.  Osteopathic findings  T9 extended rotated and side bent left L2 flexed rotated and side bent right Sacrum right on right    Impression and Recommendations:     This case required medical decision making of moderate complexity. The above documentation has been reviewed and is accurate and complete Lyndal Pulley, DO       Note: This dictation was prepared with Dragon dictation along with smaller phrase technology. Any transcriptional errors that result from this process are unintentional.

## 2019-10-06 ENCOUNTER — Ambulatory Visit (INDEPENDENT_AMBULATORY_CARE_PROVIDER_SITE_OTHER): Payer: Medicare PPO | Admitting: Family Medicine

## 2019-10-06 ENCOUNTER — Telehealth: Payer: Self-pay | Admitting: Family Medicine

## 2019-10-06 ENCOUNTER — Encounter: Payer: Self-pay | Admitting: Family Medicine

## 2019-10-06 VITALS — BP 99/64 | HR 81 | Temp 97.3°F | Resp 16 | Ht 66.0 in | Wt 108.2 lb

## 2019-10-06 DIAGNOSIS — E559 Vitamin D deficiency, unspecified: Secondary | ICD-10-CM

## 2019-10-06 DIAGNOSIS — R1011 Right upper quadrant pain: Secondary | ICD-10-CM | POA: Diagnosis not present

## 2019-10-06 DIAGNOSIS — M810 Age-related osteoporosis without current pathological fracture: Secondary | ICD-10-CM

## 2019-10-06 DIAGNOSIS — Z79899 Other long term (current) drug therapy: Secondary | ICD-10-CM | POA: Diagnosis not present

## 2019-10-06 DIAGNOSIS — N898 Other specified noninflammatory disorders of vagina: Secondary | ICD-10-CM | POA: Diagnosis not present

## 2019-10-06 DIAGNOSIS — E782 Mixed hyperlipidemia: Secondary | ICD-10-CM | POA: Diagnosis not present

## 2019-10-06 DIAGNOSIS — Z5181 Encounter for therapeutic drug level monitoring: Secondary | ICD-10-CM

## 2019-10-06 LAB — COMPREHENSIVE METABOLIC PANEL
ALT: 22 U/L (ref 0–35)
AST: 25 U/L (ref 0–37)
Albumin: 4.2 g/dL (ref 3.5–5.2)
Alkaline Phosphatase: 59 U/L (ref 39–117)
BUN: 17 mg/dL (ref 6–23)
CO2: 31 mEq/L (ref 19–32)
Calcium: 8.7 mg/dL (ref 8.4–10.5)
Chloride: 104 mEq/L (ref 96–112)
Creatinine, Ser: 0.55 mg/dL (ref 0.40–1.20)
GFR: 108.07 mL/min (ref >60.00)
Glucose, Bld: 65 mg/dL — ABNORMAL LOW (ref 70–99)
Potassium: 4.4 mEq/L (ref 3.5–5.1)
Sodium: 138 mEq/L (ref 135–145)
Total Bilirubin: 0.6 mg/dL (ref 0.2–1.2)
Total Protein: 6.7 g/dL (ref 6.0–8.3)

## 2019-10-06 LAB — LIPID PANEL
Cholesterol: 203 mg/dL — ABNORMAL HIGH (ref 0–200)
HDL: 87.6 mg/dL (ref >39.00)
LDL Cholesterol: 106 mg/dL — ABNORMAL HIGH (ref 0–99)
NonHDL: 115.28
Total CHOL/HDL Ratio: 2
Triglycerides: 44 mg/dL (ref 0.0–149.0)
VLDL: 8.8 mg/dL (ref 0.0–40.0)

## 2019-10-06 LAB — VITAMIN D 25 HYDROXY (VIT D DEFICIENCY, FRACTURES): VITD: 38.1 ng/mL (ref 30.00–100.00)

## 2019-10-06 NOTE — Progress Notes (Signed)
[name] , June 13, 1946, 74 y.o., female MRN: [id number] Patient Care Team    Relationship Specialty Notifications Start End  [name], DO PCP - General Family Medicine  05/27/15   [name], MD Consulting Physician Gastroenterology  12/08/15   Calvert Cantor, MD Consulting Physician Ophthalmology  12/08/15   Lyndal Pulley, DO Consulting Physician Family Medicine  04/07/18     Chief Complaint  Patient presents with  . Hyperlipidemia    Pt is here to have Lipids and Vit D rechecked. Has not finished the Vit D once weekly doses.      Subjective: Kelsey [name] is a 74 y.o. Pt presents for an OV for chronic medical condition follow-up today.  Osteoporosis/vit d def: Prolia injection every 6 months.  She is taking the once weekly vit d- she does forget sometimes.   Hyperlipidemia: 12/26/2018 CV risk score 12.9%.  Patient declines starting statin at that time and wanted to work on her diet and exercise routine.  She had made dietary changes and tried to increase her exercise. Despite her efforts her LDL increased . She started statin in Dec 2020> she is tolerating statin without complaints. However, she does endorse mild discomfort in her RUQ. She states it is not pain> just an "awareness". She denies nausea.   Lipid Panel     Component Value Date/Time   CHOL 283 (H) 07/07/2019 0847   TRIG 85.0 07/07/2019 0847   HDL 99.80 07/07/2019 0847   CHOLHDL 3 07/07/2019 0847   VLDL 17.0 07/07/2019 0847   LDLCALC 166 (H) 07/07/2019 0847   LDLDIRECT 131.6 11/28/2012 0951     Depression screen PHQ 2/9 12/26/2018 04/07/2018 03/05/2018 11/06/2017 12/17/2016  Decreased Interest 0 0 0 0 0  Down, Depressed, Hopeless 0 0 0 0 0  PHQ - 2 Score 0 0 0 0 0    Allergies  Allergen Reactions  . Penicillins Rash    Has patient had a PCN reaction causing immediate rash, facial/tongue/throat swelling, SOB or lightheadedness with hypotension: no Has patient had a PCN reaction causing severe  rash involving mucus membranes or skin necrosis: yes Has patient had a PCN reaction that required hospitalization: no Has patient had a PCN reaction occurring within the last 10 years: no If all of the above answers are "NO", then may proceed with Cephalosporin use.   . Sulfa Antibiotics Rash    Under arm and thighs   Social History   Social History Narrative  . Not on file   Past Medical History:  Diagnosis Date  . Allergy   . Anemia    iron deficiency  . 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 Bladder prolapse, female, acquired   . Chicken pox as a child  . Dysuria 09/13/2011  . Female bladder prolapse 09/13/2011  . Fibrocystic breast 09/14/2011  . GERD (gastroesophageal reflux disease)   . H/O measles   . H/O mumps   . Hyperlipidemia   . 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 Osteoarthrosis    right shoulder  . Osteoporosis    reclast in Oct  . Ovarian cyst 09/14/2011  . RMSF [facility] spotted fever) 2017  . Situational anxiety 03/19/2014   R/t grieving process & caregiver burden for husband who has dementia, alzheimer's.   Marland Kitchen Uterine fibroid 11/2012   See Dr. Cletis Media note, surgery 11/2012  . Vaginitis 09/13/2011   Past Surgical History:  Procedure Laterality Date  . DILATATION & CURRETTAGE/HYSTEROSCOPY WITH RESECTOCOPE N/A 11/06/2012  Procedure: DILATATION & CURETTAGE/HYSTEROSCOPY WITH RESECTOCOPE;  Surgeon: Alwyn Pea, MD;  Location: [facility];  Service: Gynecology;  Laterality: N/A;  . HERNIA REPAIR  1992  . small intestine hernia  1994  . WISDOM TOOTH EXTRACTION     Family History  Problem Relation Age of Onset  . Heart disease Mother   . Osteoporosis Mother   . Cancer Father        prostate  . Osteoporosis Sister   . Gout Brother   . Gout Maternal Grandmother   . Heart disease Maternal Grandmother   . Alzheimer's disease Maternal Grandfather   . Osteoporosis Sister    Allergies as of 10/06/2019      Reactions   Penicillins Rash   Has patient had a PCN reaction causing  immediate rash, facial/tongue/throat swelling, SOB or lightheadedness with hypotension: no Has patient had a PCN reaction causing severe rash involving mucus membranes or skin necrosis: yes Has patient had a PCN reaction that required hospitalization: no Has patient had a PCN reaction occurring within the last 10 years: no If all of the above answers are "NO", then may proceed with Cephalosporin use.   Sulfa Antibiotics Rash   Under arm and thighs      Medication List       Accurate as of October 06, 2019 10:39 AM. If you have any questions, ask your nurse or doctor.        atorvastatin 10 MG tablet Commonly known as: LIPITOR Take 1 tablet (10 mg total) by mouth daily.   Biotin 10 MG Tabs Take 1 tablet by mouth daily.   CALCIUM + D3 PO Take 600 mg by mouth daily.   Fish Oil 1000 MG Caps Take 1 capsule by mouth daily.   Glucosamine 750 MG Tabs Take 1,200 mg by mouth daily.   omeprazole 20 MG capsule Commonly known as: PRILOSEC omeprazole 20 mg capsule,delayed release   Vitamin D (Ergocalciferol) 1.25 MG (50000 UNIT) Caps capsule Commonly known as: DRISDOL Take 1 capsule (50,000 Units total) by mouth every 7 (seven) days.       All past medical history, surgical history, allergies, family history, immunizations andmedications were updated in the EMR today and reviewed under the history and medication portions of their EMR.     ROS: Negative, with the exception of above mentioned in HPI   Objective:  BP 99/64 (BP Location: Left Arm, Patient Position: Sitting, Cuff Size: Normal)   Pulse 81   Temp (!) 97.3 F (36.3 C) (Temporal)   Resp 16   Ht '5\' 6"'  (1.676 m)   Wt 108 lb 4 oz (49.1 kg)   SpO2 98%   BMI 17.47 kg/m  Body mass index is 17.47 kg/m. Gen: Afebrile. No acute distress.  HENT: AT. [place].  Eyes:Pupils Equal Round Reactive to light, Extraocular movements intact,  Conjunctiva without redness, discharge or icterus. CV: RRR no murmur, no edema Chest: CTAB, no  wheeze or crackles Abd: Soft. flat. NTND. BS present. no Masses palpated.  Neuro:  Normal gait. PERLA. EOMi. Alert. Oriented x3  No exam data present No results found. No results found for this or any previous visit (from the past 24 hour(s)).  Assessment/Plan: URIJAH RAYNOR is a 74 y.o. female present for OV for  Mixed hyperlipidemia/ Encounter for monitoring statin therapy Continue atorvastatin for now.  - Lipid panel - Comp Met (CMET)  Osteoporosis without current pathological fracture, unspecified osteoporosis type/Vitamin D deficiency Will guide her on dosing once  results received - Vitamin D (25 hydroxy) - prolia Due end of June (nurse visit if just prolia)  Vaginal discharge Pt has had yeast and BV infections (atopoium vaginae) treated apporpaitely. She no longer is using the hormone replacement (ring). She still has some vaginal discharge without symptoms.  Offered GYN referral to her- she declined at this time.   RUQ discomfort cmp collected today- exam normal. May be 2/2 to starting statin.  If labs normal- she elected to monitor and follow up if worsening and [place] could be ordered at that time.     Reviewed expectations re: course of current medical issues.  Discussed self-management of symptoms.  Outlined signs and symptoms indicating need for more acute intervention.  Patient verbalized understanding and all questions were answered.  Patient received an After-Visit Summary.    Orders Placed This Encounter  Procedures  . Lipid panel  . Comp Met (CMET)  . Vitamin D (25 hydroxy)  No orders of the defined types were placed in this encounter.  Referral Orders  No referral(s) requested today      Note is dictated utilizing voice recognition software. Although note has been proof read prior to signing, occasional typographical errors still can be missed. If any questions arise, please do not hesitate to call for verification.   electronically signed  by:  Howard Pouch, DO  [place]

## 2019-10-06 NOTE — Telephone Encounter (Signed)
Please inform patient: Her vitamin D is much improved and is now 2.  I would encourage her to ensure she is taking over-the-counter 1000 units of total vitamin D daily to maintain her levels after she completes the left over once weekly prescribed a few months ago.   Her cholesterol is greatly improved: Total cholesterol was 283 and now is 203. Good cholesterol/HDL was 99 still at a great level at 87. Bad cholesterol/LDL was 166 now 106. Continue the low-dose statin it is working great.  Lastly, her liver enzymes are normal.  She was complaining today of some mild right upper quadrant discomfort.  If discomfort is still present in 3 to 4 weeks and she would like to pursue further evaluation follow-up at that time.

## 2019-10-06 NOTE — Patient Instructions (Signed)
We will call you with lab results and guide you on Vit D dosing.   Follow up end of June with a NURSE visit for your Prolia injection.   COVID-19 Vaccine Information can be found at: [email or web address] For questions related to vaccine distribution or appointments, please email [email or web address] or call [phone].  Covid Vaccine appointment go to [email or web address].

## 2019-10-07 ENCOUNTER — Ambulatory Visit: Payer: Medicare PPO | Admitting: Family Medicine

## 2019-10-07 NOTE — Telephone Encounter (Signed)
Pt was called and given lab results, she verbalized understanding.  

## 2019-11-05 ENCOUNTER — Encounter: Payer: Self-pay | Admitting: Family Medicine

## 2019-11-05 ENCOUNTER — Ambulatory Visit (INDEPENDENT_AMBULATORY_CARE_PROVIDER_SITE_OTHER): Payer: Medicare PPO | Admitting: Family Medicine

## 2019-11-05 ENCOUNTER — Other Ambulatory Visit: Payer: Self-pay

## 2019-11-05 VITALS — BP 122/70 | HR 81 | Ht 66.0 in | Wt 108.0 lb

## 2019-11-05 DIAGNOSIS — M48061 Spinal stenosis, lumbar region without neurogenic claudication: Secondary | ICD-10-CM

## 2019-11-05 DIAGNOSIS — M999 Biomechanical lesion, unspecified: Secondary | ICD-10-CM

## 2019-11-05 NOTE — Patient Instructions (Signed)
Good to see you Keep doing the pool See me again in 4-6 weeks

## 2019-11-05 NOTE — Assessment & Plan Note (Signed)
Chronic problem with exacerbation.  Discussed medication management including over-the-counter medications.  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  Increase activity slowly over the course the next several weeks.  Follow-up again in 4 to 8 weeks

## 2019-11-05 NOTE — Assessment & Plan Note (Signed)

## 2019-11-05 NOTE — Progress Notes (Signed)
[place] Phone: [phone] Subjective:   [name], am serving as a scribe for Dr. [name]. This visit occurred during the SARS-CoV-2 public health emergency.  Safety protocols were in place, including screening questions prior to the visit, additional usage of staff PPE, and extensive cleaning of exam room while observing appropriate contact time as indicated for disinfecting solutions.   I'm seeing this patient by the request  of:  [name], [name], DO  CC: Back pain  [id number]  Kelsey [name] is a 74 y.o. female coming in with complaint of back pain. Last seen on 10/05/2019 for OMT. Patient states that her right lumbar spine pain is unchanged. Standing increases her pain.  Patient has been trying to swim on a more regular basis.  Feels like the swelling doing relatively well.      Past Medical History:  Diagnosis Date  . Allergy   . Anemia    iron deficiency  . 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 Bladder prolapse, female, acquired   . Chicken pox as a child  . Dysuria 09/13/2011  . Female bladder prolapse 09/13/2011  . Fibrocystic breast 09/14/2011  . GERD (gastroesophageal reflux disease)   . H/O measles   . H/O mumps   . Hyperlipidemia   . 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 Osteoarthrosis    right shoulder  . Osteoporosis    reclast in Oct  . Ovarian cyst 09/14/2011  . RMSF [facility] spotted fever) 2017  . Situational anxiety 03/19/2014   R/t grieving process & caregiver burden for husband who has dementia, alzheimer's.   Marland Kitchen Uterine fibroid 11/2012   See Dr. Cletis Media note, surgery 11/2012  . Vaginitis 09/13/2011   Past Surgical History:  Procedure Laterality Date  . DILATATION & CURRETTAGE/HYSTEROSCOPY WITH RESECTOCOPE N/A 11/06/2012   Procedure: DILATATION & CURETTAGE/HYSTEROSCOPY WITH RESECTOCOPE;  Surgeon: Alwyn Pea, MD;  Location: [facility];  Service: Gynecology;  Laterality: N/A;  .  HERNIA REPAIR  1992  . small intestine hernia  1994  . WISDOM TOOTH EXTRACTION     Social History   Socioeconomic History  . Marital status: Married    Spouse name: Not on file  . Number of children: Not on file  . Years of education: Not on file  . Highest education level: Not on file  Occupational History  . Not on file  Tobacco Use  . Smoking status: Former Smoker    Packs/day: 0.30    Years: 10.00    Pack years: 3.00    Types: Cigarettes    Quit date: 07/09/1978    Years since quitting: 41.3  . Smokeless tobacco: Never Used  Substance and Sexual Activity  . Alcohol use: Yes    Comment: 1-2 glasses of wine daily and a 12 oz beer daily  . Drug use: No  . Sexual activity: Never  Other Topics Concern  . Not on file  Social History Narrative  . Not on file   Social Determinants of Health   Financial Resource Strain:   . Difficulty of Paying Living Expenses:   Food Insecurity:   . Worried About Charity fundraiser in the Last Year:   . Arboriculturist in the Last Year:   Transportation Needs:   . Film/video editor (Medical):   Marland Kitchen Lack of Transportation (Non-Medical):   Physical Activity:   . Days of Exercise per Week:   . Minutes  of Exercise per Session:   Stress:   . Feeling of Stress :   Social Connections:   . Frequency of Communication with Friends and Family:   . Frequency of Social Gatherings with Friends and Family:   . Attends Religious Services:   . Active Member of Clubs or Organizations:   . Attends Archivist Meetings:   Marland Kitchen Marital Status:    Allergies  Allergen Reactions  . Penicillins Rash    Has patient had a PCN reaction causing immediate rash, facial/tongue/throat swelling, SOB or lightheadedness with hypotension: no Has patient had a PCN reaction causing severe rash involving mucus membranes or skin necrosis: yes Has patient had a PCN reaction that required hospitalization: no Has patient had a PCN reaction occurring within the  last 10 years: no If all of the above answers are "NO", then may proceed with Cephalosporin use.   . Sulfa Antibiotics Rash    Under arm and thighs   Family History  Problem Relation Age of Onset  . Heart disease Mother   . Osteoporosis Mother   . Cancer Father        prostate  . Osteoporosis Sister   . Gout Brother   . Gout Maternal Grandmother   . Heart disease Maternal Grandmother   . Alzheimer's disease Maternal Grandfather   . Osteoporosis Sister      Current Outpatient Medications (Cardiovascular):  .  atorvastatin (LIPITOR) 10 MG tablet, Take 1 tablet (10 mg total) by mouth daily.     Current Outpatient Medications (Other):  .  Biotin 10 MG TABS, Take 1 tablet by mouth daily.  .  Calcium Carb-Cholecalciferol (CALCIUM + D3 PO), Take 600 mg by mouth daily.  .  Glucosamine 750 MG TABS, Take 1,200 mg by mouth daily.  .  Omega-3 Fatty Acids (FISH OIL) 1000 MG CAPS, Take 1 capsule by mouth daily. Marland Kitchen  omeprazole (PRILOSEC) 20 MG capsule, omeprazole 20 mg capsule,delayed release   Reviewed prior external information including notes and imaging from  primary care provider As well as notes that were available from care everywhere and other healthcare systems.  Past medical history, social, surgical and family history all reviewed in electronic medical record.  No pertanent information unless stated regarding to the chief complaint.   Review of Systems:  No headache, visual changes, nausea, vomiting, diarrhea, constipation, dizziness, abdominal pain, skin rash, fevers, chills, night sweats, weight loss, swollen lymph nodes, body aches, joint swelling, chest pain, shortness of breath, mood changes. POSITIVE muscle aches  Objective  Blood pressure 122/70, pulse 81, height 5\' 6"  (1.676 m), weight 108 lb (49 kg), SpO2 98 %.   General: No apparent distress alert and oriented x3 mood and affect normal, dressed appropriately.  HEENT: Pupils equal, extraocular movements intact    Respiratory: Patient's speak in full sentences and does not appear short of breath  Cardiovascular: No lower extremity edema, non tender, no erythema  Neuro: Cranial nerves II through XII are intact, neurovascularly intact in all extremities with 2+ DTRs and 2+ pulses.  Gait antalgic Patient does have significant loss of lordosis of lumbar spine.  Degenerative scoliosis noted.  Patient does have crepitus with range of motion.  Tightness with straight leg test and FABER test bilaterally.  Osteopathic findings  T4 extended rotated and side bent left L2 flexed rotated and side bent right Sacrum right on right     Impression and Recommendations:     This case required medical decision making of moderate  complexity. The above documentation has been reviewed and is accurate and complete Lyndal Pulley, DO       Note: This dictation was prepared with Dragon dictation along with smaller phrase technology. Any transcriptional errors that result from this process are unintentional.

## 2019-12-10 ENCOUNTER — Encounter: Payer: Self-pay | Admitting: Family Medicine

## 2019-12-10 ENCOUNTER — Other Ambulatory Visit: Payer: Self-pay

## 2019-12-10 ENCOUNTER — Ambulatory Visit (INDEPENDENT_AMBULATORY_CARE_PROVIDER_SITE_OTHER): Payer: Medicare PPO | Admitting: Family Medicine

## 2019-12-10 VITALS — BP 110/62 | HR 81 | Ht 66.0 in | Wt 106.0 lb

## 2019-12-10 DIAGNOSIS — M999 Biomechanical lesion, unspecified: Secondary | ICD-10-CM

## 2019-12-10 DIAGNOSIS — M48061 Spinal stenosis, lumbar region without neurogenic claudication: Secondary | ICD-10-CM | POA: Diagnosis not present

## 2019-12-10 DIAGNOSIS — Z636 Dependent relative needing care at home: Secondary | ICD-10-CM | POA: Diagnosis not present

## 2019-12-10 NOTE — Patient Instructions (Signed)
Great to see you as always  Glad swimming is helping  Moldable mouth guard at night can help a lot  See me again in 4-6 weeks

## 2019-12-10 NOTE — Assessment & Plan Note (Signed)
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  Patient has chronic problem with very mild exacerbation.  We discussed different medications that could be beneficial but patient wants to avoid them at the moment.  Continue to be active otherwise.  Continue the home exercises.  I do believe that the underlying caregiver stress is also playing a role.  Follow-up again in 4 to 8 weeks

## 2019-12-10 NOTE — Progress Notes (Signed)
[place] Phone: [phone] Subjective:   Kelsey [name], am serving as a scribe for Dr. [name]. This visit occurred during the SARS-CoV-2 public health emergency.  Safety protocols were in place, including screening questions prior to the visit, additional usage of staff PPE, and extensive cleaning of exam room while observing appropriate contact time as indicated for disinfecting solutions.   I'm seeing this patient by the request  of:  [name], [name], DO  CC: Low back pain follow-up  [id number]  Kelsey [name] is a 74 y.o. female coming in with complaint of back and neck pain. Last seen on 11/05/2019 for OMT. Patient states  Patient states that she swims 2x a week which is helping her back pain.  Patient has known severe lumbar spinal stenosis.  Feels that the swelling is not much more improving now.  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  Continues to find some small amount of time to take care of herself at the moment.  Medications patient has been prescribed:   Taking:         Reviewed prior external information including notes and imaging from previsou exam, outside providers and external EMR if available.   As well as notes that were available from care everywhere and other healthcare systems.  Past medical history, social, surgical and family history all reviewed in electronic medical record.  [name] pertanent information unless stated regarding to the chief complaint.   Past Medical History:  Diagnosis Date  . Allergy   . Anemia    iron deficiency  . 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 Bladder prolapse, female, acquired   . Chicken pox as a child  . Dysuria 09/13/2011  . Female bladder prolapse 09/13/2011  . Fibrocystic breast 09/14/2011  . GERD (gastroesophageal reflux disease)   . H/O measles   . H/O mumps   . Hyperlipidemia   . 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  Osteoarthrosis    right shoulder  . Osteoporosis    reclast in Oct  . Ovarian cyst 09/14/2011  . RMSF [facility] spotted fever) 2017  . Situational anxiety 03/19/2014   R/t grieving process & caregiver burden for husband who has dementia, alzheimer's.   Marland Kitchen Uterine fibroid 11/2012   See Dr. Cletis Media note, surgery 11/2012  . Vaginitis 09/13/2011    Allergies  Allergen Reactions  . Penicillins Rash    Has patient had a PCN reaction causing immediate rash, facial/tongue/throat swelling, SOB or lightheadedness with hypotension: [name] Has patient had a PCN reaction causing severe rash involving mucus membranes or skin necrosis: yes Has patient had a PCN reaction that required hospitalization: [name] Has patient had a PCN reaction occurring within the last 10 years: [name] If all of the above answers are "[name]", then may proceed with Cephalosporin use.   . Sulfa Antibiotics Rash    Under arm and thighs     Review of Systems:  [name] headache, visual changes, nausea, vomiting, diarrhea, constipation, dizziness, abdominal pain, skin rash, fevers, chills, night sweats, weight loss, swollen lymph nodes, body aches, joint swelling, chest pain, shortness of breath, mood changes. POSITIVE muscle aches  Objective  There were [name] vitals taken for this visit.   General: [name] apparent distress alert and oriented x3 mood and affect normal, dressed appropriately.  HEENT: Pupils equal, extraocular movements intact  Respiratory: Patient's speak in full sentences and does not appear short of breath  Cardiovascular: [name] lower extremity edema, non tender, [name] erythema  Neuro: Cranial nerves II through XII are intact, neurovascularly intact in all extremities with 2+ DTRs and 2+ pulses.  Gait normal with good balance and coordination.  MSK: Arthritic changes of multiple joints Back exam shows decreased range of motion in all planes.  Patient does have significant pain with forward flexion to sacroiliac joints bilaterally  neurovascularly intact of the lower extremities.  Patient does have some very mild atrophy of the thighs bilaterally from patient's baseline.  Patient has been underweight for some time   Osteopathic findings   T9 extended rotated and side bent left L2 flexed rotated and side bent right Sacrum right on right       Assessment and Plan:    Nonallopathic problems  Decision today to treat with OMT was based on Physical Exam  After verbal consent patient was treated with HVLA, ME, FPR techniques in cervical, rib, thoracic, lumbar, and sacral  areas  Patient tolerated the procedure well with improvement in symptoms  Patient given exercises, stretches and lifestyle modifications  See medications in patient instructions if given  Patient will follow up in 4-8 weeks      The above documentation has been reviewed and is accurate and complete Lyndal Pulley, DO       Note: This dictation was prepared with Dragon dictation along with smaller phrase technology. Any transcriptional errors that result from this process are unintentional.

## 2019-12-10 NOTE — Assessment & Plan Note (Signed)
Social determinants of health and does not allow patient to take good care of herself secondary to being a primary caregiver for her husband.

## 2020-01-07 ENCOUNTER — Encounter: Payer: Self-pay | Admitting: Family Medicine

## 2020-01-07 ENCOUNTER — Other Ambulatory Visit: Payer: Self-pay

## 2020-01-07 ENCOUNTER — Ambulatory Visit (INDEPENDENT_AMBULATORY_CARE_PROVIDER_SITE_OTHER): Payer: Medicare PPO | Admitting: Family Medicine

## 2020-01-07 VITALS — BP 110/86 | HR 72 | Ht 66.0 in | Wt 109.0 lb

## 2020-01-07 DIAGNOSIS — M999 Biomechanical lesion, unspecified: Secondary | ICD-10-CM | POA: Diagnosis not present

## 2020-01-07 DIAGNOSIS — M48061 Spinal stenosis, lumbar region without neurogenic claudication: Secondary | ICD-10-CM | POA: Diagnosis not present

## 2020-01-07 NOTE — Patient Instructions (Addendum)
Good to see you If worse call me we will order epidural See me again in 4-5 weeks

## 2020-01-07 NOTE — Progress Notes (Signed)
[place] [address] [place] Phone: [phone] Subjective:   I Kelsey [name] am serving as a Education administrator for Dr. [name].  This visit occurred during the SARS-CoV-2 public health emergency.  Safety protocols were in place, including screening questions prior to the visit, additional usage of staff PPE, and extensive cleaning of exam room while observing appropriate contact time as indicated for disinfecting solutions.   I'm seeing this patient by the request  of:  [name], [name], DO  CC: Low back pain follow-up  [id number]  Kelsey [name] is a 74 y.o. female coming in with complaint of back and neck pain Patient states about the same. Sitting down is difficult.   Medications patient has been prescribed: Patient recently has been on a statin recently.         Reviewed prior external information including notes and imaging from previsou exam, outside providers and external EMR if available.   As well as notes that were available from care everywhere and other healthcare systems.  Past medical history, social, surgical and family history all reviewed in electronic medical record.  No pertanent information unless stated regarding to the chief complaint.   Past Medical History:  Diagnosis Date  . Allergy   . Anemia    iron deficiency  . 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 Bladder prolapse, female, acquired   . Chicken pox as a child  . Dysuria 09/13/2011  . Female bladder prolapse 09/13/2011  . Fibrocystic breast 09/14/2011  . GERD (gastroesophageal reflux disease)   . H/O measles   . H/O mumps   . Hyperlipidemia   . 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 Osteoarthrosis    right shoulder  . Osteoporosis    reclast in Oct  . Ovarian cyst 09/14/2011  . RMSF [facility] spotted fever) 2017  . Situational anxiety 03/19/2014   R/t grieving process & caregiver burden for husband who has dementia, alzheimer's.   Marland Kitchen Uterine fibroid  11/2012   See Dr. Cletis Media note, surgery 11/2012  . Vaginitis 09/13/2011    Allergies  Allergen Reactions  . Penicillins Rash    Has patient had a PCN reaction causing immediate rash, facial/tongue/throat swelling, SOB or lightheadedness with hypotension: no Has patient had a PCN reaction causing severe rash involving mucus membranes or skin necrosis: yes Has patient had a PCN reaction that required hospitalization: no Has patient had a PCN reaction occurring within the last 10 years: no If all of the above answers are "NO", then may proceed with Cephalosporin use.   . Sulfa Antibiotics Rash    Under arm and thighs     Review of Systems:  No headache, visual changes, nausea, vomiting, diarrhea, constipation, dizziness, abdominal pain, skin rash, fevers, chills, night sweats, weight loss, swollen lymph nodes, body aches, joint swelling, chest pain, shortness of breath, mood changes. POSITIVE muscle aches  Objective  Blood pressure 110/86, pulse 72, height 5\' 6"  (1.676 m), weight 109 lb (49.4 kg), SpO2 96 %.   General: No apparent distress alert and oriented x3 mood and affect normal, dressed appropriately.  HEENT: Pupils equal, extraocular movements intact  Respiratory: Patient's speak in full sentences and does not appear short of breath  Cardiovascular: No lower extremity edema, non tender, no erythema  Neuro: Cranial nerves II through XII are intact, neurovascularly intact in all extremities with 2+ DTRs and 2+ pulses.  Gait normal with good balance and coordination.  MSK:  Non tender with full  range of motion and good stability and symmetric strength and tone of shoulders, elbows, wrist, hip, knee and ankles bilaterally.  Back - Normal skin, Spine with normal alignment and no deformity.  No tenderness to vertebral process palpation.  Paraspinous muscles are not tender and without spasm.   Range of motion is full at neck and lumbar sacral regions  Osteopathic findings  C2 flexed  rotated and side bent right T3 extended rotated and side bent right inhaled rib T9 extended rotated and side bent left L2 flexed rotated and side bent right Sacrum right on right       Assessment and Plan:  Degenerative disc disease lumbar-chronic problem with mild exacerbation.  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  Has been trying to be active but finding it difficult.  Social determinants of health including patient being the primary caregiver for her ailing husband but usually would not have her do formal physical therapy at this time.  Patient declines any medication because she does not want to have difficulty waking up if her husband needs it.  Follow-up again in 4 to 8 weeks    Nonallopathic problems  Decision today to treat with OMT was based on Physical Exam  After verbal consent patient was treated with HVLA, ME, FPR techniques in thoracic, lumbar, and sacral  areas  Patient tolerated the procedure well with improvement in symptoms  Patient given exercises, stretches and lifestyle modifications  See medications in patient instructions if given  Patient will follow up in 4-8 weeks      The above documentation has been reviewed and is accurate and complete Lyndal Pulley, DO       Note: This dictation was prepared with Dragon dictation along with smaller phrase technology. Any transcriptional errors that result from this process are unintentional.

## 2020-02-12 ENCOUNTER — Ambulatory Visit (INDEPENDENT_AMBULATORY_CARE_PROVIDER_SITE_OTHER): Payer: Medicare PPO | Admitting: Family Medicine

## 2020-02-12 ENCOUNTER — Encounter: Payer: Self-pay | Admitting: Family Medicine

## 2020-02-12 ENCOUNTER — Other Ambulatory Visit: Payer: Self-pay

## 2020-02-12 VITALS — BP 90/52 | HR 77 | Ht 66.0 in | Wt 107.0 lb

## 2020-02-12 DIAGNOSIS — R42 Dizziness and giddiness: Secondary | ICD-10-CM | POA: Insufficient documentation

## 2020-02-12 DIAGNOSIS — M48061 Spinal stenosis, lumbar region without neurogenic claudication: Secondary | ICD-10-CM

## 2020-02-12 DIAGNOSIS — M999 Biomechanical lesion, unspecified: Secondary | ICD-10-CM | POA: Diagnosis not present

## 2020-02-12 NOTE — Assessment & Plan Note (Signed)
Severe degenerative stenosis.  Recently did have a fall some mild increase in exacerbation.  Responded well though to manipulation.  Not taking any medications regularly for this and patient declined anything especially with patient having dizziness at this time.  Discussed wanting to see patient more frequently other than 4-week intervals.  Concerned that patient is having some deconditioning.

## 2020-02-12 NOTE — Patient Instructions (Addendum)
Vestibular neuro - PT If dizziness gets worse call primary care or ER  If not better in 4-6 weeks we will consider labs and imaging See me again in 4-5 weeks

## 2020-02-12 NOTE — Progress Notes (Addendum)
[facility] [place] Phone: [phone] Subjective:   [name], am serving as a scribe for Dr. [name]. This visit occurred during the SARS-CoV-2 public health emergency.  Safety protocols were in place, including screening questions prior to the visit, additional usage of staff PPE, and extensive cleaning of exam room while observing appropriate contact time as indicated for disinfecting solutions.   I'm seeing this patient by the request  of:  [name], [name], DO  CC: Low back pain with follow-up  [id number]  Kelsey [name] is a 74 y.o. female coming in with complaint of back and neck pain. OMT 01/07/2020. Patient states suffered a fall a week ago. No injury to back. Abrasions on face and throughout body. Is using magnet sheets and is having deeper sleep.   TMJ joint pain improved after using mouth guard.   Patient is also complaining of dizziness for one month that seems to be worsening. Was having dizziness with sit to stand but now having dizziness with lying down. Patient also states that when she is trying to read that the words are jumping on the screen. Does not believe that it is worse since fall but does feel like symptoms have progressed over the past month.  Patient states that the back seems to be doing relatively well.  Not as severe as some tenderness but still worse than last time we have seen her.  Patient states still not been trying to do the exercises but still the primary caregiver for her ailing husband.          Reviewed prior external information including notes and imaging from previsou exam, outside providers and external EMR if available.   As well as notes that were available from care everywhere and other healthcare systems.  Past medical history, social, surgical and family history all reviewed in electronic medical record.  No pertanent information unless stated regarding to  the chief complaint.   Past Medical History:  Diagnosis Date  . Allergy   . Anemia    iron deficiency  . 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 Bladder prolapse, female, acquired   . Chicken pox as a child  . Dysuria 09/13/2011  . Female bladder prolapse 09/13/2011  . Fibrocystic breast 09/14/2011  . GERD (gastroesophageal reflux disease)   . H/O measles   . H/O mumps   . Hyperlipidemia   . 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 Osteoarthrosis    right shoulder  . Osteoporosis    reclast in Oct  . Ovarian cyst 09/14/2011  . RMS[facility] spotted fever) 2017  . Situational anxiety 03/19/2014   R/t grieving process & caregiver burden for husband who has dementia, alzheimer's.   Marland Kitchen Uterine fibroid 11/2012   See Dr. Cletis Media note, surgery 11/2012  . Vaginitis 09/13/2011    Allergies  Allergen Reactions  . Penicillins Rash    Has patient had a PCN reaction causing immediate rash, facial/tongue/throat swelling, SOB or lightheadedness with hypotension: no Has patient had a PCN reaction causing severe rash involving mucus membranes or skin necrosis: yes Has patient had a PCN reaction that required hospitalization: no Has patient had a PCN reaction occurring within the last 10 years: no If all of the above answers are "NO", then may proceed with Cephalosporin use.   . Sulfa Antibiotics Rash    Under arm and thighs     Review of Systems:  No headache, visual changes, nausea, vomiting,  diarrhea, constipation,  abdominal pain, skin rash, fevers, chills, night sweats, weight loss, swollen lymph nodes, body aches, joint swelling, chest pain, shortness of breath, mood changes. POSITIVE muscle aches, dizziness  Objective  Blood pressure (!) 90/52, pulse 77, height 5\' 6"  (1.676 m), weight 107 lb (48.5 kg), SpO2 95 %.   General: No apparent distress alert and oriented x3 mood and affect normal, dressed appropriately.  HEENT: Pupils equal, extraocular movements intact no nystagmus noted today Respiratory:  Patient's speak in full sentences and does not appear short of breath  Cardiovascular: No lower extremity edema, non tender, no erythema  Neuro: Cranial nerves appear to be intact Gait normal with good balance and coordination.  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  Decreased range of motion in all planes secondary to the back.  Mild tightness with FABER test which is more than usual.  Tightness with straight leg test but no radicular symptoms.  Patient's neck exam shows the patient does have some mild loss of lordosis.  Possibly some mild increase in dizziness with extension of the neck but seem to be worse with change in positioning from laying to sitting and sitting to standing only lasted a few seconds well.  Osteopathic findings   T9 extended rotated and side bent left L2 flexed rotated and side bent right L4 flexed rotated and side bent right Sacrum right on right       Assessment and Plan: Dizziness Patient continues to have difficulty with dizziness.  Has seen primary care provider for previously.  At this point I will refer patient to vestibular neural training.  I will discussed with patient is a possibility of imaging and laboratory work-up which patient declined.  I did discuss though that if she has any worsening symptoms she needs to call 911 immediately and encouraged her that otherwise talk to her primary care provider as soon as possible.  We did discuss hydration food intake being beneficial for this as well.  Patient is in agreement in 4 to 6 weeks if not improved at follow-up we will get the MRI angiogram of the head and neck as well as laboratory work-up  Degenerative lumbar spinal stenosis Severe degenerative stenosis.  Recently did have a fall some mild increase in exacerbation.  Responded well though to manipulation.  Not taking any medications regularly for this and patient declined anything especially  with patient having dizziness at this time.  Discussed wanting to see patient more frequently other than 4-week intervals.  Concerned that patient is having some deconditioning.     Nonallopathic problems  Decision today to treat with OMT was based on Physical Exam  After verbal consent patient was treated with HVLA, ME, FPR techniques in  thoracic, lumbar, and sacral  areas  Patient tolerated the procedure well with improvement in symptoms  Patient given exercises, stretches and lifestyle modifications  See medications in patient instructions if given  Patient will follow up in 4-8 weeks      The above documentation has been reviewed and is accurate and complete Lyndal Pulley, DO       Note: This dictation was prepared with Dragon dictation along with smaller phrase technology. Any transcriptional errors that result from this process are unintentional.

## 2020-02-12 NOTE — Assessment & Plan Note (Signed)
Patient continues to have difficulty with dizziness.  Has seen primary care provider for previously.  At this point I will refer patient to vestibular neural training.  I will discussed with patient is a possibility of imaging and laboratory work-up which patient declined.  I did discuss though that if she has any worsening symptoms she needs to call 911 immediately and encouraged her that otherwise talk to her primary care provider as soon as possible.  We did discuss hydration food intake being beneficial for this as well.  Patient is in agreement in 4 to 6 weeks if not improved at follow-up we will get the MRI angiogram of the head and neck as well as laboratory work-up

## 2020-02-17 DIAGNOSIS — R262 Difficulty in walking, not elsewhere classified: Secondary | ICD-10-CM | POA: Diagnosis not present

## 2020-02-17 DIAGNOSIS — H81392 Other peripheral vertigo, left ear: Secondary | ICD-10-CM | POA: Diagnosis not present

## 2020-02-17 DIAGNOSIS — H8112 Benign paroxysmal vertigo, left ear: Secondary | ICD-10-CM | POA: Diagnosis not present

## 2020-02-17 DIAGNOSIS — H5500 Unspecified nystagmus: Secondary | ICD-10-CM | POA: Diagnosis not present

## 2020-02-19 DIAGNOSIS — H81392 Other peripheral vertigo, left ear: Secondary | ICD-10-CM | POA: Diagnosis not present

## 2020-02-19 DIAGNOSIS — R262 Difficulty in walking, not elsewhere classified: Secondary | ICD-10-CM | POA: Diagnosis not present

## 2020-02-19 DIAGNOSIS — H8112 Benign paroxysmal vertigo, left ear: Secondary | ICD-10-CM | POA: Diagnosis not present

## 2020-02-19 DIAGNOSIS — H5500 Unspecified nystagmus: Secondary | ICD-10-CM | POA: Diagnosis not present

## 2020-02-23 DIAGNOSIS — R262 Difficulty in walking, not elsewhere classified: Secondary | ICD-10-CM | POA: Diagnosis not present

## 2020-02-23 DIAGNOSIS — H81392 Other peripheral vertigo, left ear: Secondary | ICD-10-CM | POA: Diagnosis not present

## 2020-02-23 DIAGNOSIS — H8112 Benign paroxysmal vertigo, left ear: Secondary | ICD-10-CM | POA: Diagnosis not present

## 2020-02-23 DIAGNOSIS — H5500 Unspecified nystagmus: Secondary | ICD-10-CM | POA: Diagnosis not present

## 2020-02-25 ENCOUNTER — Telehealth: Payer: Self-pay

## 2020-02-25 NOTE — Telephone Encounter (Signed)
Kelsey [name] spoke with patient yesterday and offered to go to ED for evaluation. Patient declined stating pain was tolerable and would prefer to see PCP. Appt scheduled for 8/30  Patient Name: Kelsey [name] Gender: Female DOB: 08/09/1945 Age: 74 Y 3 M 24 D Return Phone Number: [phone] (Primary) Address: City/State/Zip: [place] [address] Client [facility] - Client Client Site [place] - Day Physician Kelsey [name], [place] Contact Type Call Who Is Calling Patient / Member / Family / Caregiver Call Type Triage / Clinical Relationship To Patient Self Return Phone Number [phone] (Primary) Chief Complaint Flank Pain Reason for Call Symptomatic / Request for [name] states she is having the same pain as when she was diagnosed with acid reflux. She states it is a sharp pain on left side around her ribs and pain in between her shoulder blades. She has pain when she breathes deep. She states she thinks she has glass stuck in her foot also. Translation No Nurse Assessment Nurse: Kelsey Aly, RN, Kelsey [name] Date/Time (Eastern Time): 02/24/2020 3:24:58 PM Confirm and document reason for call. If symptomatic, describe symptoms. ---She states it is a sharp pain on left side around her ribs and pain in below her shoulder blades intermittently. She has pain when she breathes deep. She states she had glass stuck in left foot, pulled out, still feels like something is there. No pain right now Has the patient had close contact with a person known or suspected to have the novel coronavirus illness OR traveled / lives in area with major community spread (including international travel) in the last 14 days from the onset of symptoms? * If Asymptomatic, screen for exposure and travel within the last 14 days. ---No Does the patient have any new or worsening symptoms? ---Yes Will a triage be completed? ---Yes Related visit to  physician within the last 2 weeks? ---No Does the PT have any chronic conditions? (i.e. diabetes, asthma, this includes High risk factors for pregnancy, etc.) ---Yes List chronic conditions. ---osteoporosis, high cholesterol Is this a behavioral health or substance abuse call? ---No Guidelines Guideline Title Affirmed Question Affirmed Notes Nurse Date/Time Kelsey [name] Time) Chest Pain [1] Chest pain lasts > 5 minutes AND [2] Kelsey [name] 02/24/2020 3:30:10 PMPLEASE NOTE: All timestamps contained within this report are represented as [place] Standard Time. CONFIDENTIALTY NOTICE: This fax transmission is intended only for the addressee. It contains information that is legally privileged, confidential or otherwise protected from use or disclosure. If you are not the intended recipient, you are strictly prohibited from reviewing, disclosing, copying using or disseminating any of this information or taking any action in reliance on or regarding this information. If you have received this fax in error, please notify us immediately by telephone so that we can arrange for its return to [place]. Phone: [phone], Toll-Free: [phone], Fax: [phone] Page: 2 of 2 Call Id: [id number] Guidelines Guideline Title Affirmed Question Affirmed Notes Nurse Date/Time Kelsey [name] Time) occurred in past 3 days (72 hours) (Exception: feels exactly the same as previously diagnosed heartburn and has accompanying sour taste in mouth) Disp. Time Kelsey [name] Time) Disposition Final User 02/24/2020 3:41:10 PM Go to ED Now (or PCP triage) Yes Kelsey Aly, RN, Kelsey [name] Disagree/Comply Disagree Caller Understands Yes PreDisposition Call Doctor Care Advice Given Per Guideline GO TO ED NOW (OR PCP TRIAGE): * IF NO PCP (PRIMARY CARE PROVIDER) SECOND-LEVEL TRIAGE: You need to be seen within the next hour. Go to the [facility]  at _____________ [place] as soon as you can. CARE ADVICE given per Chest Pain (Adult)  guideline. CALL EMS IF: * Severe difficulty breathing occurs * Passes out or becomes too weak to stand * You become worse. Comments User: Kelsey Herald, RN Date/Time Kelsey [name] Time): 02/24/2020 3:41:54 PM Caller states she does not want to go to the ED, would prefer to be seen in office. Office contacted per directives and will call her back. Referrals GO TO FACILITY REFUSED

## 2020-02-25 NOTE — Telephone Encounter (Signed)
Patient requests call back. Patient is having upper torso pain on her back. Pain was tolerable so she didn't go to ED. Patient also cares for  husband so she can't go to the hospital.

## 2020-02-25 NOTE — Telephone Encounter (Signed)
Chest pain comes and goes under left breast since May. Moving pains from chest top back. Suggested going to the ER or urgent care if she can not tolerate pain. Has appt on 03/07/20 but will put on wait list

## 2020-03-01 DIAGNOSIS — H81392 Other peripheral vertigo, left ear: Secondary | ICD-10-CM | POA: Diagnosis not present

## 2020-03-01 DIAGNOSIS — H8112 Benign paroxysmal vertigo, left ear: Secondary | ICD-10-CM | POA: Diagnosis not present

## 2020-03-01 DIAGNOSIS — R262 Difficulty in walking, not elsewhere classified: Secondary | ICD-10-CM | POA: Diagnosis not present

## 2020-03-01 DIAGNOSIS — H5500 Unspecified nystagmus: Secondary | ICD-10-CM | POA: Diagnosis not present

## 2020-03-02 ENCOUNTER — Other Ambulatory Visit: Payer: Self-pay

## 2020-03-02 ENCOUNTER — Ambulatory Visit (INDEPENDENT_AMBULATORY_CARE_PROVIDER_SITE_OTHER): Payer: Medicare PPO | Admitting: Family Medicine

## 2020-03-02 ENCOUNTER — Encounter: Payer: Self-pay | Admitting: Family Medicine

## 2020-03-02 VITALS — BP 98/56 | HR 67 | Temp 98.5°F | Ht 65.98 in | Wt 106.4 lb

## 2020-03-02 DIAGNOSIS — R1012 Left upper quadrant pain: Secondary | ICD-10-CM

## 2020-03-02 DIAGNOSIS — R109 Unspecified abdominal pain: Secondary | ICD-10-CM | POA: Diagnosis not present

## 2020-03-02 DIAGNOSIS — R1013 Epigastric pain: Secondary | ICD-10-CM | POA: Diagnosis not present

## 2020-03-02 DIAGNOSIS — S90859A Superficial foreign body, unspecified foot, initial encounter: Secondary | ICD-10-CM

## 2020-03-02 MED ORDER — FAMOTIDINE 20 MG PO TABS
20.0000 mg | ORAL_TABLET | Freq: Two times a day (BID) | ORAL | 0 refills | Status: DC
Start: 2020-03-02 — End: 2020-03-30

## 2020-03-02 MED ORDER — SUCRALFATE 1 G PO TABS
1.0000 g | ORAL_TABLET | Freq: Three times a day (TID) | ORAL | 0 refills | Status: DC
Start: 2020-03-02 — End: 2020-03-30

## 2020-03-02 MED ORDER — OMEPRAZOLE 40 MG PO CPDR
40.0000 mg | DELAYED_RELEASE_CAPSULE | Freq: Two times a day (BID) | ORAL | 0 refills | Status: DC
Start: 2020-03-02 — End: 2020-03-30

## 2020-03-02 NOTE — Progress Notes (Signed)
This visit occurred during the SARS-CoV-2 public health emergency.  Safety protocols were in place, including screening questions prior to the visit, additional usage of staff PPE, and extensive cleaning of exam room while observing appropriate contact time as indicated for disinfecting solutions.    Kelsey [name] , 11/24/1945, 74 y.o., female MRN: [id number] Patient Care Team    Relationship Specialty Notifications Start End  [name], DO PCP - General Family Medicine  05/27/15   [name], MD Consulting Physician Gastroenterology  12/08/15   Calvert Cantor, MD Consulting Physician Ophthalmology  12/08/15   Lyndal Pulley, DO Consulting Physician Family Medicine  04/07/18     Chief Complaint  Patient presents with  . Back Pain    Pt c/o back and chest pain x2-3 months.   . Dizziness    Pt c/o dizziness x2weeks      Subjective: Pt presents for an OV with multiple complaints. Back pain/abdomen pain: Patient reports she started to have stabbing pains in her left upper quadrant that were sharp and took her breath away in May.  She had several episodes of this pain within a few days.  In June she had severe epigastric pain in which she felt she had chest pain.  She was not seen for this at that time.  In July she noted her left lateral ribs started hurting (pointing near pancreas).  She has noticed mild discomfort in her right upper quadrant just 2-3 times.  She started to have back pain last week that radiates across her bra line.  She had dizziness 1 evening in which she felt like the bed moved.  She did go to vestibular rehab for this and thought she was better after 2 treatments.  She has a history of GERD and she is on lower dose PPI. Colonoscopy completed 2015-10-year follow-up.  EGD 2015 which showed diffuse inflammation changes and she had a distal esophagus nodule that was biopsied-glycogenic  Acanthosis. Patient does not know if pains are associated with empty stomach  or surrounding meals.  She feels laying flat on her back makes the pain worse and feels like there is a mass or heaviness to her.  She has lost 5 pounds in the last 6 months.  She denies nausea, vomiting, night sweats or bowel changes.  Depression screen [facility] 2/9 03/02/2020 12/26/2018 04/07/2018 03/05/2018 11/06/2017  Decreased Interest 1 0 0 0 0  Down, Depressed, Hopeless 0 0 0 0 0  PHQ - 2 Score 1 0 0 0 0    Allergies  Allergen Reactions  . Penicillins Rash    Has patient had a PCN reaction causing immediate rash, facial/tongue/throat swelling, SOB or lightheadedness with hypotension: no Has patient had a PCN reaction causing severe rash involving mucus membranes or skin necrosis: yes Has patient had a PCN reaction that required hospitalization: no Has patient had a PCN reaction occurring within the last 10 years: no If all of the above answers are "NO", then may proceed with Cephalosporin use.   . Sulfa Antibiotics Rash    Under arm and thighs   Social History   Social History Narrative  . Not on file   Past Medical History:  Diagnosis Date  . Allergy   . Anemia    iron deficiency  . 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 Bladder prolapse, female, acquired   . Chicken pox as a child  . Dysuria 09/13/2011  . Female bladder prolapse 09/13/2011  . Fibrocystic breast  09/14/2011  . GERD (gastroesophageal reflux disease)   . H/O measles   . H/O mumps   . Hyperlipidemia   . 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 Osteoarthrosis    right shoulder  . Osteoporosis    reclast in Oct  . Ovarian cyst 09/14/2011  . RMSF [facility] spotted fever) 2017  . Situational anxiety 03/19/2014   R/t grieving process & caregiver burden for husband who has dementia, alzheimer's.   Marland Kitchen Uterine fibroid 11/2012   See Dr. Cletis Media note, surgery 11/2012  . Vaginitis 09/13/2011   Past Surgical History:  Procedure Laterality Date  . DILATATION & CURRETTAGE/HYSTEROSCOPY WITH RESECTOCOPE N/A 11/06/2012   Procedure: DILATATION &  CURETTAGE/HYSTEROSCOPY WITH RESECTOCOPE;  Surgeon: Alwyn Pea, MD;  Location: [facility];  Service: Gynecology;  Laterality: N/A;  . HERNIA REPAIR  1992  . small intestine hernia  1994  . WISDOM TOOTH EXTRACTION     Family History  Problem Relation Age of Onset  . Heart disease Mother   . Osteoporosis Mother   . Cancer Father        prostate  . Osteoporosis Sister   . Gout Brother   . Gout Maternal Grandmother   . Heart disease Maternal Grandmother   . Alzheimer's disease Maternal Grandfather   . Osteoporosis Sister    Allergies as of 03/02/2020      Reactions   Penicillins Rash   Has patient had a PCN reaction causing immediate rash, facial/tongue/throat swelling, SOB or lightheadedness with hypotension: no Has patient had a PCN reaction causing severe rash involving mucus membranes or skin necrosis: yes Has patient had a PCN reaction that required hospitalization: no Has patient had a PCN reaction occurring within the last 10 years: no If all of the above answers are "NO", then may proceed with Cephalosporin use.   Sulfa Antibiotics Rash   Under arm and thighs      Medication List       Accurate as of March 02, 2020 11:59 PM. If you have any questions, ask your nurse or doctor.        STOP taking these medications   Biotin 10 MG Tabs Stopped by: Howard Pouch, DO   CALCIUM + D3 PO Stopped by: Howard Pouch, DO   Fish Oil 1000 MG Caps Stopped by: Howard Pouch, DO   Glucosamine 750 MG Tabs Stopped by: Howard Pouch, DO     TAKE these medications   atorvastatin 10 MG tablet Commonly known as: LIPITOR Take 1 tablet (10 mg total) by mouth daily.   famotidine 20 MG tablet Commonly known as: Pepcid Take 1 tablet (20 mg total) by mouth 2 (two) times daily. Started by: Howard Pouch, DO   omeprazole 20 MG capsule Commonly known as: PRILOSEC omeprazole 20 mg capsule,delayed release What changed: Another medication with the same name was added. Make sure you  understand how and when to take each. Changed by: Howard Pouch, DO   omeprazole 40 MG capsule Commonly known as: PRILOSEC Take 1 capsule (40 mg total) by mouth in the morning and at bedtime. What changed: You were already taking a medication with the same name, and this prescription was added. Make sure you understand how and when to take each. Changed by: Howard Pouch, DO   sucralfate 1 g tablet Commonly known as: Carafate Take 1 tablet (1 g total) by mouth 4 (four) times daily -  with meals and at bedtime. Started by: Howard Pouch, DO  All past medical history, surgical history, allergies, family history, immunizations andmedications were updated in the EMR today and reviewed under the history and medication portions of their EMR.     ROS: Negative, with the exception of above mentioned in HPI   Objective:  BP (!) 98/56 (BP Location: Left Arm, Patient Position: Sitting)   Pulse 67   Temp 98.5 F (36.9 C)   Ht 5' 5.98" (1.676 m)   Wt 106 lb 6.4 oz (48.3 kg)   SpO2 96%   BMI 17.18 kg/m  Body mass index is 17.18 kg/m. Gen: Afebrile. No acute distress. Nontoxic in appearance, well developed, well nourished.  HENT: AT. [place].  Eyes:Pupils Equal Round Reactive to light, Extraocular movements intact,  Conjunctiva without redness, discharge or icterus. Neck/lymp/endocrine: Supple, no lymphadenopathy CV: RRR  Chest: CTAB, no wheeze or crackles. Good air movement, normal resp effort.  Abd: Soft.  Thin. ND.  Significant/severe tenderness to palpation upper epigastric and just left of epigastric region.  BS present.  Guarding present.  Remainder of exam difficult due to discomfort. Neuro:  Normal gait. PERLA. EOMi. Alert. Oriented x3  Psych: Normal affect, dress and demeanor. Normal speech. Normal thought content and judgment.  No exam data present No results found. Results for orders placed or performed in visit on 03/02/20 (from the past 24 hour(s))  Comp Met (CMET)      Status: Abnormal   Collection Time: 03/02/20  2:46 PM  Result Value Ref Range   Glucose, Bld 111 (H) 65 - 99 mg/dL   BUN 15 7 - 25 mg/dL   Creat 0.63 0.60 - 0.93 mg/dL   BUN/Creatinine Ratio NOT APPLICABLE 6 - 22 (calc)   Sodium 139 135 - 146 mmol/L   Potassium 4.2 3.5 - 5.3 mmol/L   Chloride 103 98 - 110 mmol/L   CO2 28 20 - 32 mmol/L   Calcium 8.9 8.6 - 10.4 mg/dL   Total Protein 6.7 6.1 - 8.1 g/dL   Albumin 4.2 3.6 - 5.1 g/dL   Globulin 2.5 1.9 - 3.7 g/dL (calc)   AG Ratio 1.7 1.0 - 2.5 (calc)   Total Bilirubin 0.4 0.2 - 1.2 mg/dL   Alkaline phosphatase (APISO) 65 37 - 153 U/L   AST 25 10 - 35 U/L   ALT 19 6 - 29 U/L  Lipase     Status: None   Collection Time: 03/02/20  2:46 PM  Result Value Ref Range   Lipase 51 7 - 60 U/L  Sedimentation rate     Status: None   Collection Time: 03/02/20  2:46 PM  Result Value Ref Range   Sed Rate 14 0 - 30 mm/h  H. pylori antibody, IgG     Status: None   Collection Time: 03/02/20  2:46 PM  Result Value Ref Range   H Pylori IgG Negative Negative  CBC     Status: None   Collection Time: 03/02/20  2:46 PM  Result Value Ref Range   WBC 4.7 3.8 - 10.8 Thousand/uL   RBC 3.90 3.80 - 5.10 Million/uL   Hemoglobin 12.2 11.7 - 15.5 g/dL   HCT 37.0 35 - 45 %   MCV 94.9 80.0 - 100.0 fL   MCH 31.3 27.0 - 33.0 pg   MCHC 33.0 32.0 - 36.0 g/dL   RDW 12.3 11.0 - 15.0 %   Platelets 274 140 - 400 Thousand/uL   MPV 9.3 7.5 - 12.5 fL    Assessment/Plan: LUANNA WEESNER is  a 74 y.o. female present for OV for  LUQ pain/epigastric pain Rather significant tenderness on exam today that caused patient to jump on the table in the epigastric area.  Concern for ulceration versus pancreatic etiology.  Will obtain labs today and order a CT to be done soon as possible for further evaluation. -In the meantime increased Protonix dose to twice daily, added Pepcid and Carafate. - Comp Met (CMET) - Lipase - Sedimentation rate - H. pylori antibody, IgG -  CBC Dependent upon CT and laboratory results  Foreign body foot: Patient also states she has a piece of glass that is just below the surface of the skin in her foot that has been there for 2 months.  It causes her discomfort when she steps on this area.  She would like to have it removed. Referral to podiatry placed   Reviewed expectations re: course of current medical issues.  Discussed self-management of symptoms.  Outlined signs and symptoms indicating need for more acute intervention.  Patient verbalized understanding and all questions were answered.  Patient received an After-Visit Summary.    Orders Placed This Encounter  Procedures  . CT Abdomen Pelvis W Contrast  . Comp Met (CMET)  . Lipase  . Sedimentation rate  . H. pylori antibody, IgG  . CBC  . Ambulatory referral to [place] ordered this encounter  Medications  . omeprazole (PRILOSEC) 40 MG capsule    Sig: Take 1 capsule (40 mg total) by mouth in the morning and at bedtime.    Dispense:  60 capsule    Refill:  0  . famotidine (PEPCID) 20 MG tablet    Sig: Take 1 tablet (20 mg total) by mouth 2 (two) times daily.    Dispense:  60 tablet    Refill:  0  . sucralfate (CARAFATE) 1 g tablet    Sig: Take 1 tablet (1 g total) by mouth 4 (four) times daily -  with meals and at bedtime.    Dispense:  120 tablet    Refill:  0    Referral Orders     Ambulatory referral to Podiatry   Note is dictated utilizing voice recognition software. Although note has been proof read prior to signing, occasional typographical errors still can be missed. If any questions arise, please do not hesitate to call for verification.   electronically signed by:  Howard Pouch, DO  [place]

## 2020-03-02 NOTE — Patient Instructions (Signed)
Start  Omeprazole 40 mg every 12 hours.  Pepcid 1 tab every 12 hours.  And carafate 1 tab before meals and before bed.   We will call you with lab results and imaging schedule.   I believe you may have either a stomach gastritis or ulcer or you may have pancreatitis.    Hydrate. plenty of water.

## 2020-03-03 ENCOUNTER — Telehealth: Payer: Self-pay

## 2020-03-03 DIAGNOSIS — R059 Cough, unspecified: Secondary | ICD-10-CM

## 2020-03-03 LAB — COMPREHENSIVE METABOLIC PANEL
AG Ratio: 1.7 (calc) (ref 1.0–2.5)
ALT: 19 U/L (ref 6–29)
AST: 25 U/L (ref 10–35)
Albumin: 4.2 g/dL (ref 3.6–5.1)
Alkaline phosphatase (APISO): 65 U/L (ref 37–153)
BUN: 15 mg/dL (ref 7–25)
CO2: 28 mmol/L (ref 20–32)
Calcium: 8.9 mg/dL (ref 8.6–10.4)
Chloride: 103 mmol/L (ref 98–110)
Creat: 0.63 mg/dL (ref 0.60–0.93)
Globulin: 2.5 g/dL (calc) (ref 1.9–3.7)
Glucose, Bld: 111 mg/dL — ABNORMAL HIGH (ref 65–99)
Potassium: 4.2 mmol/L (ref 3.5–5.3)
Sodium: 139 mmol/L (ref 135–146)
Total Bilirubin: 0.4 mg/dL (ref 0.2–1.2)
Total Protein: 6.7 g/dL (ref 6.1–8.1)

## 2020-03-03 LAB — CBC
HCT: 37 % (ref 35.0–45.0)
Hemoglobin: 12.2 g/dL (ref 11.7–15.5)
MCH: 31.3 pg (ref 27.0–33.0)
MCHC: 33 g/dL (ref 32.0–36.0)
MCV: 94.9 fL (ref 80.0–100.0)
MPV: 9.3 fL (ref 7.5–12.5)
Platelets: 274 10*3/uL (ref 140–400)
RBC: 3.9 10*6/uL (ref 3.80–5.10)
RDW: 12.3 % (ref 11.0–15.0)
WBC: 4.7 10*3/uL (ref 3.8–10.8)

## 2020-03-03 LAB — SEDIMENTATION RATE: Sed Rate: 14 mm/h (ref 0–30)

## 2020-03-03 LAB — LIPASE: Lipase: 51 U/L (ref 7–60)

## 2020-03-03 LAB — H. PYLORI ANTIBODY, IGG: H Pylori IgG: NEGATIVE

## 2020-03-03 NOTE — Telephone Encounter (Signed)
Patient was seen yesterday, she forgot to mention to Dr. [name] about other symptoms she was having.  She reports she has some shortness of breath when she breathes in, then she coughs, and she also has some mucus in throat.  Please call [phone]

## 2020-03-04 ENCOUNTER — Other Ambulatory Visit: Payer: Self-pay

## 2020-03-04 DIAGNOSIS — R262 Difficulty in walking, not elsewhere classified: Secondary | ICD-10-CM | POA: Diagnosis not present

## 2020-03-04 DIAGNOSIS — H81392 Other peripheral vertigo, left ear: Secondary | ICD-10-CM | POA: Diagnosis not present

## 2020-03-04 DIAGNOSIS — R1012 Left upper quadrant pain: Secondary | ICD-10-CM

## 2020-03-04 DIAGNOSIS — H8112 Benign paroxysmal vertigo, left ear: Secondary | ICD-10-CM | POA: Diagnosis not present

## 2020-03-04 DIAGNOSIS — R1013 Epigastric pain: Secondary | ICD-10-CM

## 2020-03-04 DIAGNOSIS — H5500 Unspecified nystagmus: Secondary | ICD-10-CM | POA: Diagnosis not present

## 2020-03-04 NOTE — Telephone Encounter (Signed)
Noted. Recommendations are for her to take the medications prescribed for her at her appt and add OTC allegra 160 mg QD (allergy medicine). Once we have the ABD CT results back we will know more and guide her on next steps.  In the meantime I have also added a cxr order for her. She can have completed over the weekend at [place] and we will call her Monday with those results.   Of course if SOB and cough worsens, then she should be seen immediately in an urgent care setting.

## 2020-03-04 NOTE — Telephone Encounter (Signed)
Pt notified and voiced understanding 

## 2020-03-04 NOTE — Telephone Encounter (Signed)
LM to call back.

## 2020-03-05 ENCOUNTER — Ambulatory Visit (HOSPITAL_BASED_OUTPATIENT_CLINIC_OR_DEPARTMENT_OTHER)
Admission: RE | Admit: 2020-03-05 | Discharge: 2020-03-05 | Disposition: A | Discharge status: Home / Self Care | Payer: Medicare PPO | Source: Ambulatory Visit | Attending: Family Medicine | Admitting: Family Medicine

## 2020-03-05 ENCOUNTER — Other Ambulatory Visit: Payer: Self-pay

## 2020-03-05 ENCOUNTER — Encounter (HOSPITAL_BASED_OUTPATIENT_CLINIC_OR_DEPARTMENT_OTHER): Payer: Self-pay

## 2020-03-05 DIAGNOSIS — R059 Cough, unspecified: Secondary | ICD-10-CM

## 2020-03-05 DIAGNOSIS — J90 Pleural effusion, not elsewhere classified: Secondary | ICD-10-CM | POA: Diagnosis not present

## 2020-03-05 DIAGNOSIS — R1013 Epigastric pain: Secondary | ICD-10-CM | POA: Diagnosis not present

## 2020-03-05 DIAGNOSIS — R1012 Left upper quadrant pain: Secondary | ICD-10-CM | POA: Diagnosis not present

## 2020-03-05 DIAGNOSIS — R05 Cough: Secondary | ICD-10-CM | POA: Diagnosis not present

## 2020-03-05 DIAGNOSIS — D259 Leiomyoma of uterus, unspecified: Secondary | ICD-10-CM | POA: Diagnosis not present

## 2020-03-05 DIAGNOSIS — N281 Cyst of kidney, acquired: Secondary | ICD-10-CM | POA: Diagnosis not present

## 2020-03-05 MED ORDER — IOHEXOL 300 MG/ML  SOLN
100.0000 mL | Freq: Once | INTRAMUSCULAR | Status: AC | PRN
  Administered 2020-03-05: 80 mL via INTRAVENOUS

## 2020-03-06 ENCOUNTER — Encounter: Payer: Self-pay | Admitting: Family Medicine

## 2020-03-06 ENCOUNTER — Telehealth: Payer: Self-pay | Admitting: Family Medicine

## 2020-03-06 DIAGNOSIS — R1013 Epigastric pain: Secondary | ICD-10-CM

## 2020-03-06 NOTE — Telephone Encounter (Signed)
Please call patient Chest x-ray is normal CT scan shows a small kidney cyst.  Simple kidney cyst are not worrisome.  They are common.  This would not be the cause of her symptoms. Recommend she remain on the medications prescribed to her during her visit and I have referred her back to her gastroenterologist for further evaluation (Dr. Collene Mares).  She has had a history of gastritis in the past per review of electronic medical records.  I believe her symptoms are coming from her stomach.

## 2020-03-07 ENCOUNTER — Telehealth: Payer: Self-pay

## 2020-03-07 ENCOUNTER — Ambulatory Visit: Payer: Medicare PPO | Admitting: Family Medicine

## 2020-03-07 NOTE — Telephone Encounter (Signed)
Spoke w/ Pt- informed of results and recommendations. Pt verbalized understanding.  

## 2020-03-07 NOTE — Telephone Encounter (Signed)
Pt w/ her husband today for visit- she stopped by my desk and had several questions about omeprazole and Carafate- answered to the best of my ability.

## 2020-03-10 NOTE — Progress Notes (Signed)
[facility] [address] [place] Phone: [phone] Subjective:   I Kelsey [name] am serving as a Education administrator for Dr. [name].  This visit occurred during the SARS-CoV-2 public health emergency.  Safety protocols were in place, including screening questions prior to the visit, additional usage of staff PPE, and extensive cleaning of exam room while observing appropriate contact time as indicated for disinfecting solutions.   I'm seeing this patient by the request  of:  [name], [name], DO  CC: Low back pain  TWS:[id number]  Kelsey [name] is a 74 y.o. female coming in with complaint of back and neck pain. OMT 02/12/2020. Patient states she is doing bad all over. Stung by a bee yesterday. Left hand is swollen. States she has been having rib pain. PCP told her it is either an ulcer or pancreatis. Patient wants to know about toe nail issue. Believes it may be iron deficiency.  Patient has been not feeling good overall.  Further work-up including some laboratory work-up as well as CT abdomen pelvis that was independently visualized by me showing no significant difficulty at this moment I would be in corresponding to her back pain.  Still seems to be related to that.    Medications patient has been prescribed:   Taking:         Reviewed prior external information including notes and imaging from previsou exam, outside providers and external EMR if available.   As well as notes that were available from care everywhere and other healthcare systems.  Past medical history, social, surgical and family history all reviewed in electronic medical record.  No pertanent information unless stated regarding to the chief complaint.   Past Medical History:  Diagnosis Date  . Allergy   . Anemia    iron deficiency  . 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 Bladder prolapse, female, acquired   . Chicken pox as a child  . Dysuria 09/13/2011  . Female  bladder prolapse 09/13/2011  . Fibrocystic breast 09/14/2011  . GERD (gastroesophageal reflux disease)   . H/O measles   . H/O mumps   . Hyperlipidemia   . 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 Osteoarthrosis    right shoulder  . Osteoporosis    reclast in Oct  . Ovarian cyst 09/14/2011  . Renal cyst left   3 cm  . RMSF [facility] spotted fever) 2017  . Situational anxiety 03/19/2014   R/t grieving process & caregiver burden for husband who has dementia, alzheimer's.   Marland Kitchen Uterine fibroid 11/2012   See Dr. Cletis Media note, surgery 11/2012; calcified- multiple  . Vaginitis 09/13/2011    Allergies  Allergen Reactions  . Penicillins Rash    Has patient had a PCN reaction causing immediate rash, facial/tongue/throat swelling, SOB or lightheadedness with hypotension: no Has patient had a PCN reaction causing severe rash involving mucus membranes or skin necrosis: yes Has patient had a PCN reaction that required hospitalization: no Has patient had a PCN reaction occurring within the last 10 years: no If all of the above answers are "NO", then may proceed with Cephalosporin use.   . Sulfa Antibiotics Rash    Under arm and thighs     Review of Systems:  No headache, visual changes, nausea, vomiting, diarrhea, constipation, dizziness, abdominal pain, skin rash, fevers, chills, night sweats, weight loss, swollen lymph nodes, body aches, joint swelling, chest pain, shortness of breath, mood changes. POSITIVE muscle aches  Objective  Blood pressure  100/60, pulse 84, height 5\' 5"  (1.651 m), weight 107 lb (48.5 kg), SpO2 97 %.   General: No apparent distress alert and oriented x3 mood and affect normal, dressed appropriately.  Left hand exam shows the patient does have significant swelling of the hand compared to the contralateral side with warmness noted.  It does cross the wrist crease, no significant streaking no noted at the moment.  Patient has some difficulty with closing of the hand secondary to the  swelling  Back exam has loss of lordosis with degenerative scoliosis, tightness of the straight leg test that is more than usual.  Tightness with FABER test.  Osteopathic findings  T9 extended rotated and side bent left L2 flexed rotated and side bent right L4 flexed rotated and side bent left Sacrum right on right       Assessment and Plan:  Cellulitis of finger of left hand Patient was stung by bee and is continuing to have any worsening redness she states of the hand now greater than 48 hours out.  Cover with doxycycline but we did discuss with her if she felt like it was improving before she took the antibiotic she did not need to take it and it could just be an allergic reaction but patient has taken Benadryl with no significant success.  Discussed with patient about red streaking and when to seek medical attention with this being the weekend.  Patient is in agreement with the plan  Degenerative lumbar spinal stenosis Severe overall.  Patient has had some stomach issues recently been being treated for potential gastric ulcer by primary care, patient continued to have discomfort and pain and sent for a CT abdomen pelvis that was independently visualized by me.  CT scan of the abdomen was fairly unremarkable as well with some degenerative disc disease of the lumbar spine.  Patient is to follow-up with GI and I think that is a good next step.    Nonallopathic problems  Decision today to treat with OMT was based on Physical Exam  After verbal consent patient was treated with  ME, FPR techniques inthoracic, lumbar, and sacral  areas  Patient tolerated the procedure well with improvement in symptoms  Patient given exercises, stretches and lifestyle modifications  See medications in patient instructions if given  Patient will follow up in 4-8 weeks      The above documentation has been reviewed and is accurate and complete Lyndal Pulley, DO       Note: This dictation was  prepared with Dragon dictation along with smaller phrase technology. Any transcriptional errors that result from this process are unintentional.

## 2020-03-11 ENCOUNTER — Ambulatory Visit (INDEPENDENT_AMBULATORY_CARE_PROVIDER_SITE_OTHER): Payer: Medicare PPO | Admitting: Family Medicine

## 2020-03-11 ENCOUNTER — Encounter: Payer: Self-pay | Admitting: Family Medicine

## 2020-03-11 ENCOUNTER — Other Ambulatory Visit: Payer: Self-pay

## 2020-03-11 VITALS — BP 100/60 | HR 84 | Ht 65.0 in | Wt 107.0 lb

## 2020-03-11 DIAGNOSIS — L03012 Cellulitis of left finger: Secondary | ICD-10-CM

## 2020-03-11 DIAGNOSIS — M999 Biomechanical lesion, unspecified: Secondary | ICD-10-CM | POA: Diagnosis not present

## 2020-03-11 DIAGNOSIS — M48061 Spinal stenosis, lumbar region without neurogenic claudication: Secondary | ICD-10-CM | POA: Diagnosis not present

## 2020-03-11 MED ORDER — VITAMIN D (ERGOCALCIFEROL) 1.25 MG (50000 UNIT) PO CAPS: 50000.0000 [IU] | ORAL_CAPSULE | ORAL | 0 refills | Status: DC

## 2020-03-11 MED ORDER — DOXYCYCLINE HYCLATE 100 MG PO TABS: 100.0000 mg | ORAL_TABLET | Freq: Two times a day (BID) | ORAL | 0 refills | Status: DC

## 2020-03-11 NOTE — Patient Instructions (Addendum)
Once weekly vitamin D Doxy 100 mg 2 times a day for 7 days if needed Toe nail looks like fungus try over the counter  Do PT for the back Restart once weekly vitamin D Follow up with GI doctor  See me again in 6 weeks

## 2020-03-11 NOTE — Assessment & Plan Note (Signed)
Patient was stung by bee and is continuing to have any worsening redness she states of the hand now greater than 48 hours out.  Cover with doxycycline but we did discuss with her if she felt like it was improving before she took the antibiotic she did not need to take it and it could just be an allergic reaction but patient has taken Benadryl with no significant success.  Discussed with patient about red streaking and when to seek medical attention with this being the weekend.  Patient is in agreement with the plan

## 2020-03-11 NOTE — Assessment & Plan Note (Signed)
Severe overall.  Patient has had some stomach issues recently been being treated for potential gastric ulcer by primary care, patient continued to have discomfort and pain and sent for a CT abdomen pelvis that was independently visualized by me.  CT scan of the abdomen was fairly unremarkable as well with some degenerative disc disease of the lumbar spine.  Patient is to follow-up with GI and I think that is a good next step.

## 2020-03-15 ENCOUNTER — Ambulatory Visit: Payer: Medicare Other | Admitting: Podiatry

## 2020-03-15 DIAGNOSIS — H25813 Combined forms of age-related cataract, bilateral: Secondary | ICD-10-CM | POA: Diagnosis not present

## 2020-03-15 DIAGNOSIS — H02831 Dermatochalasis of right upper eyelid: Secondary | ICD-10-CM | POA: Diagnosis not present

## 2020-03-15 DIAGNOSIS — H5213 Myopia, bilateral: Secondary | ICD-10-CM | POA: Diagnosis not present

## 2020-03-15 DIAGNOSIS — H524 Presbyopia: Secondary | ICD-10-CM | POA: Diagnosis not present

## 2020-03-15 DIAGNOSIS — H11153 Pinguecula, bilateral: Secondary | ICD-10-CM | POA: Diagnosis not present

## 2020-03-15 DIAGNOSIS — H04123 Dry eye syndrome of bilateral lacrimal glands: Secondary | ICD-10-CM | POA: Diagnosis not present

## 2020-03-16 ENCOUNTER — Other Ambulatory Visit: Payer: Medicare PPO

## 2020-03-17 DIAGNOSIS — R1013 Epigastric pain: Secondary | ICD-10-CM | POA: Diagnosis not present

## 2020-03-17 DIAGNOSIS — K219 Gastro-esophageal reflux disease without esophagitis: Secondary | ICD-10-CM | POA: Diagnosis not present

## 2020-03-21 ENCOUNTER — Telehealth: Payer: Self-pay

## 2020-03-21 DIAGNOSIS — Z03818 Encounter for observation for suspected exposure to other biological agents ruled out: Secondary | ICD-10-CM | POA: Diagnosis not present

## 2020-03-21 DIAGNOSIS — Z20822 Contact with and (suspected) exposure to covid-19: Secondary | ICD-10-CM | POA: Diagnosis not present

## 2020-03-21 MED ORDER — BENZONATATE 200 MG PO CAPS: 200.0000 mg | ORAL_CAPSULE | Freq: Two times a day (BID) | ORAL | 0 refills | Status: DC | PRN

## 2020-03-21 NOTE — Telephone Encounter (Signed)
Pt scheduled for 9:15 - 09/14

## 2020-03-21 NOTE — Telephone Encounter (Signed)
Patient coughing the entire time I was speaking to her. She reports no fever.  She understands that we are busy, but if we could call today if possible. Patient asked about something else besides mucinex. It is not working at all.  [phone].

## 2020-03-21 NOTE — Telephone Encounter (Addendum)
If she has had a negative Covid test then she can be scheduled. Please schedule her. She can be worked in Architectural technologist 03/22/2020 at 9:15 AM for the 4:00 slot if still open.  If these are not open or do not work for her, then the next available appointment per schedule openings.

## 2020-03-21 NOTE — Telephone Encounter (Signed)
PT states husband has the same symptoms she has. She states she went to [facility] and tested negative for COVID today. Please advise

## 2020-03-21 NOTE — Telephone Encounter (Signed)
Patient should be seen for possible COVID-19 infection.  Would encourage her to go to an urgent care to be seen if coughing is continuous so that they may test her for COVID-19 and obtain a chest x-ray.  As far as cough suppressant she can continue Mucinex DM or try Delsym.  I will call in [place] to help with cough as well.  However, she needs to make sure she is seen. Currently with those symptoms we would not be able to see her in the office-unless her Covid testing is negative.  Prescription called into [facility] in [place] and [address].

## 2020-03-21 NOTE — Telephone Encounter (Signed)
Please advise, see Dana's note  Pt is also requesting appt

## 2020-03-22 ENCOUNTER — Other Ambulatory Visit: Payer: Self-pay

## 2020-03-22 ENCOUNTER — Encounter: Payer: Self-pay | Admitting: Family Medicine

## 2020-03-22 ENCOUNTER — Ambulatory Visit (INDEPENDENT_AMBULATORY_CARE_PROVIDER_SITE_OTHER): Payer: Medicare PPO | Admitting: Family Medicine

## 2020-03-22 VITALS — BP 98/61 | HR 86 | Temp 98.4°F | Resp 16 | Wt 104.2 lb

## 2020-03-22 DIAGNOSIS — R05 Cough: Secondary | ICD-10-CM | POA: Diagnosis not present

## 2020-03-22 DIAGNOSIS — J029 Acute pharyngitis, unspecified: Secondary | ICD-10-CM

## 2020-03-22 DIAGNOSIS — R059 Cough, unspecified: Secondary | ICD-10-CM

## 2020-03-22 LAB — RESPIRATORY VIRUS PANEL
Influenza A RNA: NOT DETECTED
Influenza B RNA: NOT DETECTED
RSV RNA: DETECTED — AB
hMPV: NOT DETECTED

## 2020-03-22 MED ORDER — DOXYCYCLINE HYCLATE 100 MG PO TABS
100.0000 mg | ORAL_TABLET | Freq: Two times a day (BID) | ORAL | 0 refills | Status: DC
Start: 2020-03-22 — End: 2020-04-07

## 2020-03-22 MED ORDER — PREDNISONE 50 MG PO TABS: 50.0000 mg | ORAL_TABLET | Freq: Every day | ORAL | 0 refills | Status: DC

## 2020-03-22 NOTE — Progress Notes (Signed)
This visit occurred during the SARS-CoV-2 public health emergency.  Safety protocols were in place, including screening questions prior to the visit, additional usage of staff PPE, and extensive cleaning of exam room while observing appropriate contact time as indicated for disinfecting solutions.    Kelsey [name] , Kelsey [name], Kelsey [name], 74 y.o., female MRN: [id number] Patient Care Team    Relationship Specialty Notifications Start End  [name], DO PCP - General Family Medicine  05/27/15   [name], MD Consulting Physician Gastroenterology  12/08/15   Calvert Cantor, MD Consulting Physician Ophthalmology  12/08/15   Lyndal Pulley, DO Consulting Physician Family Medicine  04/07/18     Chief Complaint  Patient presents with  . Cough     Subjective: Pt presents for an OV with complaints of worsening cough  of 2 weeks duration.   She denies loss of sense of taste or smell.  She endorses runny nose, cough and sneezing.  She has tried Tylenol and Mucinex to help with her symptoms.  She feels she is mildly improving today but still has some concerns that her cough has last so long.  Her husband is also ill with a similar illness. She has been tested for covid and reportedly negative with a rapid test.  She has been vaccinated for COVID.   Depression screen [facility] 2/9 03/02/2020 12/26/2018 04/07/2018 03/05/2018 11/06/2017  Decreased Interest 1 0 0 0 0  Down, Depressed, Hopeless 0 0 0 0 0  PHQ - 2 Score 1 0 0 0 0    Allergies  Allergen Reactions  . Penicillins Rash    Has patient had a PCN reaction causing immediate rash, facial/tongue/throat swelling, SOB or lightheadedness with hypotension: no Has patient had a PCN reaction causing severe rash involving mucus membranes or skin necrosis: yes Has patient had a PCN reaction that required hospitalization: no Has patient had a PCN reaction occurring within the last 10 years: no If all of the above answers are "NO", then may proceed with  Cephalosporin use.   . Sulfa Antibiotics Rash    Under arm and thighs   Social History   Social History Narrative  . Not on file   Past Medical History:  Diagnosis Date  . Allergy   . Anemia    iron deficiency  . 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 Bladder prolapse, female, acquired   . Chicken pox as a child  . Dysuria 09/13/2011  . Female bladder prolapse 09/13/2011  . Fibrocystic breast 09/14/2011  . GERD (gastroesophageal reflux disease)   . H/O measles   . H/O mumps   . Hyperlipidemia   . 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 Osteoarthrosis    right shoulder  . Osteoporosis    reclast in Oct  . Ovarian cyst 09/14/2011  . Renal cyst left   3 cm  . [facility] spotted fever) 2017  . Situational anxiety 03/19/2014   R/t grieving process & caregiver burden for husband who has dementia, alzheimer's.   Marland Kitchen Uterine fibroid 11/2012   See Dr. Cletis Media note, surgery 11/2012; calcified- multiple  . Vaginitis 09/13/2011   Past Surgical History:  Procedure Laterality Date  . DILATATION & CURRETTAGE/HYSTEROSCOPY WITH RESECTOCOPE N/A 11/06/2012   Procedure: DILATATION & CURETTAGE/HYSTEROSCOPY WITH RESECTOCOPE;  Surgeon: Alwyn Pea, MD;  Location: [facility];  Service: Gynecology;  Laterality: N/A;  . HERNIA REPAIR  1992  . small intestine hernia  1994  . WISDOM TOOTH EXTRACTION  Family History  Problem Relation Age of Onset  . Heart disease Mother   . Osteoporosis Mother   . Cancer Father        prostate  . Osteoporosis Sister   . Gout Brother   . Gout Maternal Grandmother   . Heart disease Maternal Grandmother   . Alzheimer's disease Maternal Grandfather   . Osteoporosis Sister    Allergies as of 03/22/2020      Reactions   Penicillins Rash   Has patient had a PCN reaction causing immediate rash, facial/tongue/throat swelling, SOB or lightheadedness with hypotension: no Has patient had a PCN reaction causing severe rash involving mucus membranes or skin necrosis: yes Has  patient had a PCN reaction that required hospitalization: no Has patient had a PCN reaction occurring within the last 10 years: no If all of the above answers are "NO", then may proceed with Cephalosporin use.   Sulfa Antibiotics Rash   Under arm and thighs      Medication List       Accurate as of March 22, 2020 11:59 PM. If you have any questions, ask your nurse or doctor.        atorvastatin 10 MG tablet Commonly known as: LIPITOR Take 1 tablet (10 mg total) by mouth daily.   benzonatate 200 MG capsule Commonly known as: TESSALON Take 1 capsule (200 mg total) by mouth 2 (two) times daily as needed for cough.   doxycycline 100 MG tablet Commonly known as: VIBRA-TABS Take 1 tablet (100 mg total) by mouth 2 (two) times daily.   famotidine 20 MG tablet Commonly known as: Pepcid Take 1 tablet (20 mg total) by mouth 2 (two) times daily.   omeprazole 20 MG capsule Commonly known as: PRILOSEC omeprazole 20 mg capsule,delayed release   omeprazole 40 MG capsule Commonly known as: PRILOSEC Take 1 capsule (40 mg total) by mouth in the morning and at bedtime.   predniSONE 50 MG tablet Commonly known as: DELTASONE Take 1 tablet (50 mg total) by mouth daily with breakfast. Started by: Howard Pouch, DO   sucralfate 1 g tablet Commonly known as: Carafate Take 1 tablet (1 g total) by mouth 4 (four) times daily -  with meals and at bedtime.   Vitamin D (Ergocalciferol) 1.25 MG (50000 UNIT) Caps capsule Commonly known as: DRISDOL Take 1 capsule (50,000 Units total) by mouth every 7 (seven) days.       All past medical history, surgical history, allergies, family history, immunizations andmedications were updated in the EMR today and reviewed under the history and medication portions of their EMR.     ROS: Negative, with the exception of above mentioned in HPI   Objective:  BP 98/61 (BP Location: Left Arm, Patient Position: Sitting, Cuff Size: Normal)   Pulse 86   Temp  98.4 F (36.9 C) (Oral)   Resp 16   Wt 104 lb 3.2 oz (47.3 kg)   SpO2 97%   BMI 17.34 kg/m  Body mass index is 17.34 kg/m. Gen: Afebrile. No acute distress. Nontoxic in appearance, well developed, well nourished.  HENT: AT. [place]. Bilateral TM visualized without erythema or bulging. MMM, no oral lesions. Bilateral nares with mild erythema and drainage. Throat without erythema or exudates.  Mild cough present.  Mild hoarseness present. Eyes:Pupils Equal Round Reactive to light, Extraocular movements intact,  Conjunctiva without redness, discharge or icterus. Neck/lymp/endocrine: Supple, no lymphadenopathy CV: RRR  Chest: CTAB, no wheeze or crackles. Good air movement, normal resp effort.  Skin: no rashes, purpura or petechiae.  Neuro: Normal gait. PERLA. EOMi. Alert. Oriented x3  Psych: Normal affect, dress and demeanor. Normal speech. Normal thought content and judgment.  No exam data present No results found. No results found for this or any previous visit (from the past [name] hour(s)).  Assessment/Plan: KAYLANY TESORIERO is a 74 y.o. female present for OV for  Cough/sore throat Rest, hydrate.  +/- flonase, mucinex (DM if cough), nettie pot or nasal saline.  Doxycycline and prednisone prescribed, take until completed.  If cough present it can last up to 6-8 weeks.  F/U 2 weeks of not improved.   - Novel Coronavirus, NAA ([facility]) - Respiratory virus panel    Reviewed expectations re: course of current medical issues.  Discussed self-management of symptoms.  Outlined signs and symptoms indicating need for more acute intervention.  Patient verbalized understanding and all questions were answered.  Patient received an After-Visit Summary.    Orders Placed This Encounter  Procedures  . Novel Coronavirus, NAA ([facility])  . Respiratory virus panel  . SARS-COV-2, NAA 2 DAY TAT   Meds ordered this encounter  Medications  . doxycycline (VIBRA-TABS) 100 MG tablet    Sig:  Take 1 tablet (100 mg total) by mouth 2 (two) times daily.    Dispense:  20 tablet    Refill:  0  . predniSONE (DELTASONE) 50 MG tablet    Sig: Take 1 tablet (50 mg total) by mouth daily with breakfast.    Dispense:  5 tablet    Refill:  0   Referral Orders  No referral(s) requested today     Note is dictated utilizing voice recognition software. Although note has been proof read prior to signing, occasional typographical errors still can be missed. If any questions arise, please do not hesitate to call for verification.   electronically signed by:  Howard Pouch, DO  [place]

## 2020-03-22 NOTE — Patient Instructions (Addendum)
You do have signs of a sinus infection and bronchitis on exam today.   We will call you with lab results.   Start doxycycline every 12 hours with food and prednisone once daily.   Continue the mucinex and tessalon perles.    Hydrate and rest.

## 2020-03-23 ENCOUNTER — Telehealth: Payer: Self-pay | Admitting: Family Medicine

## 2020-03-23 NOTE — Telephone Encounter (Signed)
Disregard. Patient was confused about medications.

## 2020-03-23 NOTE — Telephone Encounter (Signed)
Patient states she was expecting to pick up

## 2020-03-25 LAB — SARS-COV-2, NAA 2 DAY TAT

## 2020-03-25 LAB — NOVEL CORONAVIRUS, NAA: SARS-CoV-2, NAA: NOT DETECTED

## 2020-03-30 ENCOUNTER — Other Ambulatory Visit: Payer: Self-pay | Admitting: Family Medicine

## 2020-03-31 ENCOUNTER — Telehealth: Payer: Self-pay

## 2020-03-31 NOTE — Telephone Encounter (Signed)
Patient difficult to understand but she is asking about sucralfate (CARAFATE) 1 g tablet medication.

## 2020-04-04 NOTE — Telephone Encounter (Signed)
Spoke with pt and advised pt to take medication as precribed. Pt more concerned about husband.

## 2020-04-07 ENCOUNTER — Ambulatory Visit (INDEPENDENT_AMBULATORY_CARE_PROVIDER_SITE_OTHER): Payer: Medicare PPO

## 2020-04-07 ENCOUNTER — Telehealth: Payer: Self-pay

## 2020-04-07 DIAGNOSIS — K219 Gastro-esophageal reflux disease without esophagitis: Secondary | ICD-10-CM

## 2020-04-07 NOTE — Progress Notes (Signed)
Discuss with pt medications and instructions. Instructed pt to follow sig on sucralfate that is written and to call GI office if sig needs to be changed. Pt had question if should resume her atorvastatin that was paused due to GI issues.

## 2020-04-07 NOTE — Telephone Encounter (Signed)
Sched for NV

## 2020-04-07 NOTE — Telephone Encounter (Signed)
Patient is here today, he husband has an appt with Dr. Anitra Lauth.  Once checkin process was completed for him, she asked if she could speak to Dr. [name] today about her meds.  She does not have an appt. I told Alivea I could get a message back to her nurse. She has a bag with several bottles of medication.  She is not sure what she needs to take.

## 2020-04-08 ENCOUNTER — Telehealth: Payer: Self-pay

## 2020-04-08 NOTE — Progress Notes (Addendum)
Carafate  should be taken on an empty stomach about 30 min prior to meal.   Pt wanted to know if she should take it before or with meals

## 2020-04-08 NOTE — Telephone Encounter (Signed)
Kelsey [name], [place] at 04/07/2020 11:15 AM  Status: Addendum    Carafate  should be taken on an empty stomach about 30 min prior to meal.   Pt wanted to know if she should take it before or with meals     Revision History                   [name], [name], DO at 04/07/2020 11:15 AM  Status: Signed    She can resume her atorvastatin. What was her question with the sucralfate?     Kelsey [name], [place] at 04/07/2020 11:15 AM  Status: Signed    Discuss with pt medications and instructions. Instructed pt to follow sig on sucralfate that is written and to call GI office if sig needs to be changed. Pt had question if should resume her atorvastatin that was paused due to GI issues.

## 2020-04-08 NOTE — Telephone Encounter (Signed)
Whatever she is told to do from GI is what she should do.

## 2020-04-08 NOTE — Telephone Encounter (Signed)
Spoke with pt regarding instructions for medication. Pt spoke with Dr. Collene Mares and would like to know if she should follow the instructions given by her which was, crush pills and drink with water between meals BID. Please advise if agree?

## 2020-04-08 NOTE — Progress Notes (Signed)
She can resume her atorvastatin. What was her question with the sucralfate?

## 2020-04-08 NOTE — Progress Notes (Signed)
See telephone encounter.

## 2020-04-11 NOTE — Telephone Encounter (Signed)
Pt agreed and verbalized understanding.

## 2020-04-21 ENCOUNTER — Ambulatory Visit (INDEPENDENT_AMBULATORY_CARE_PROVIDER_SITE_OTHER): Payer: Medicare PPO | Admitting: Family Medicine

## 2020-04-21 ENCOUNTER — Other Ambulatory Visit: Payer: Self-pay

## 2020-04-21 ENCOUNTER — Encounter: Payer: Self-pay | Admitting: Family Medicine

## 2020-04-21 VITALS — BP 110/72 | Ht 65.0 in | Wt 106.0 lb

## 2020-04-21 DIAGNOSIS — M47818 Spondylosis without myelopathy or radiculopathy, sacral and sacrococcygeal region: Secondary | ICD-10-CM

## 2020-04-21 DIAGNOSIS — M999 Biomechanical lesion, unspecified: Secondary | ICD-10-CM

## 2020-04-21 DIAGNOSIS — M533 Sacrococcygeal disorders, not elsewhere classified: Secondary | ICD-10-CM | POA: Diagnosis not present

## 2020-04-21 NOTE — Assessment & Plan Note (Signed)
Significant degenerative spinal stenosis.  Chronic problem with exacerbation.  Does not want any medications though.  We will start formal physical therapy at a new location.  Patient will increase activity slowly.  Follow-up with me again 4 to 5 weeks

## 2020-04-21 NOTE — Progress Notes (Signed)
[place] Phone: [phone] Subjective:   [name], am serving as a scribe for Dr. [name]. This visit occurred during the SARS-CoV-2 public health emergency.  Safety protocols were in place, including screening questions prior to the visit, additional usage of staff PPE, and extensive cleaning of exam room while observing appropriate contact time as indicated for disinfecting solutions.   I'm seeing this patient by the request  of:  [name], [name], DO  CC: Back and neck pain follow-up  [id number]  Kelsey [name] is a 74 y.o. female coming in with complaint of back and neck pain. OMT 03/11/2020. Patient states that she had to discontinue PT due to RSV that she contracted in September.  Since then she is try to be active but has not been able to go to physical therapy.  Would like to consider a new location.  Patient states some tightness recently.  Not having any of the dizziness at the moment.  Mild tightness in the back with less of the radiation down the leg  Medications patient has been prescribed: Nothing at the moment          Reviewed prior external information including notes and imaging from previsou exam, outside providers and external EMR if available.   As well as notes that were available from care everywhere and other healthcare systems.  Past medical history, social, surgical and family history all reviewed in electronic medical record.  No pertanent information unless stated regarding to the chief complaint.   Past Medical History:  Diagnosis Date  . Allergy   . Anemia    iron deficiency  . 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 Bladder prolapse, female, acquired   . Chicken pox as a child  . Dysuria 09/13/2011  . Female bladder prolapse 09/13/2011  . Fibrocystic breast 09/14/2011  . GERD (gastroesophageal reflux disease)   . H/O measles   . H/O mumps   . Hyperlipidemia   .  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 Osteoarthrosis    right shoulder  . Osteoporosis    reclast in Oct  . Ovarian cyst 09/14/2011  . Renal cyst left   3 cm  . [facility] spotted fever) 2017  . Situational anxiety 03/19/2014   R/t grieving process & caregiver burden for husband who has dementia, alzheimer's.   Marland Kitchen Uterine fibroid 11/2012   See Dr. Cletis Media note, surgery 11/2012; calcified- multiple  . Vaginitis 09/13/2011    Allergies  Allergen Reactions  . Penicillins Rash    Has patient had a PCN reaction causing immediate rash, facial/tongue/throat swelling, SOB or lightheadedness with hypotension: no Has patient had a PCN reaction causing severe rash involving mucus membranes or skin necrosis: yes Has patient had a PCN reaction that required hospitalization: no Has patient had a PCN reaction occurring within the last 10 years: no If all of the above answers are "NO", then may proceed with Cephalosporin use.   . Sulfa Antibiotics Rash    Under arm and thighs     Review of Systems:  No headache, visual changes, nausea, vomiting, diarrhea, constipation, dizziness, abdominal pain, skin rash, fevers, chills, night sweats, weight loss, swollen lymph nodes, body aches, joint swelling, chest pain, shortness of breath, mood changes. POSITIVE muscle aches  Objective  Blood pressure 110/72, height 5\' 5"  (1.651 m), weight 106 lb (48.1 kg), SpO2 97 %.   General: No apparent distress alert and oriented x3 mood  and affect normal, dressed appropriately.  HEENT: Pupils equal, extraocular movements intact  Respiratory: Patient's speak in full sentences and does not appear short of breath  Cardiovascular: No lower extremity edema, non tender, no erythema  Neuro: Cranial nerves II through XII are intact, neurovascularly intact in all extremities with 2+ DTRs and 2+ pulses.  Gait normal with good balance and coordination.  MSK: Mild arthritic changes of multiple joints  Osteopathic  findings   T8 extended rotated and side bent left L1 flexed rotated and side bent right L4 flexed rotated and side bent left Sacrum right on right       Assessment and Plan:  Degenerative lumbar spinal stenosis Significant degenerative spinal stenosis.  Chronic problem with exacerbation.  Does not want any medications though.  We will start formal physical therapy at a new location.  Patient will increase activity slowly.  Follow-up with me again 4 to 5 weeks    Nonallopathic problems  Decision today to treat with OMT was based on Physical Exam  After verbal consent patient was treated with HVLA, ME, FPR techniques in thoracic, lumbar, and sacral  areas  Patient tolerated the procedure well with improvement in symptoms  Patient given exercises, stretches and lifestyle modifications  See medications in patient instructions if given  Patient will follow up in 4-8 weeks      The above documentation has been reviewed and is accurate and complete Lyndal Pulley, DO       Note: This dictation was prepared with Dragon dictation along with smaller phrase technology. Any transcriptional errors that result from this process are unintentional.

## 2020-04-21 NOTE — Patient Instructions (Signed)
Great to see you Glad you recovered [employer] will call you for physical therapy See me again in 4-5 weeks

## 2020-04-28 ENCOUNTER — Other Ambulatory Visit: Payer: Self-pay | Admitting: Family Medicine

## 2020-04-28 DIAGNOSIS — Z1231 Encounter for screening mammogram for malignant neoplasm of breast: Secondary | ICD-10-CM

## 2020-05-02 ENCOUNTER — Encounter: Payer: Self-pay | Admitting: Family Medicine

## 2020-05-02 ENCOUNTER — Other Ambulatory Visit: Payer: Self-pay

## 2020-05-02 MED ORDER — ZOSTER VAC RECOMB ADJUVANTED 50 MCG/0.5ML IM SUSR: 0.5000 mL | Freq: Once | INTRAMUSCULAR | 1 refills | Status: AC

## 2020-05-02 NOTE — Telephone Encounter (Signed)
Pt asked to get a new Rx for shingles vaccine. Approved to e-scribe by PCP.

## 2020-05-17 ENCOUNTER — Ambulatory Visit: Payer: Medicare PPO

## 2020-05-18 NOTE — Progress Notes (Signed)
[place] [address] [place] Phone: [phone] Subjective:   I [name] am serving as a Education administrator for Dr. [name].  This visit occurred during the SARS-CoV-2 public health emergency.  Safety protocols were in place, including screening questions prior to the visit, additional usage of staff PPE, and extensive cleaning of exam room while observing appropriate contact time as indicated for disinfecting solutions.   I'm seeing this patient by the request  of:  [name], [name], DO  CC: Low back pain follow-up  [id number]  Kelsey [name] is a 74 y.o. female coming in with complaint of back and neck pain. OMT 04/21/2020. Patient states she has appointment with PT on November 30th and hopes that it would help. Right sided lower back pain is the worse today. Has been bending a lot and taking care of her Husband.   Medications patient has been prescribed: Vitamin D  Taking: Yes         Reviewed prior external information including notes and imaging from previsou exam, outside providers and external EMR if available.   As well as notes that were available from care everywhere and other healthcare systems.  Past medical history, social, surgical and family history all reviewed in electronic medical record.  No pertanent information unless stated regarding to the chief complaint.   Past Medical History:  Diagnosis Date  . Allergy   . Anemia    iron deficiency  . 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 Bladder prolapse, female, acquired   . Chicken pox as a child  . Dysuria 09/13/2011  . Female bladder prolapse 09/13/2011  . Fibrocystic breast 09/14/2011  . GERD (gastroesophageal reflux disease)   . H/O measles   . H/O mumps   . Hyperlipidemia   . 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 Osteoarthrosis    right shoulder  . Osteoporosis    reclast in Oct  . Ovarian cyst 09/14/2011  . Renal cyst left   3 cm  . [facility]  spotted fever) 2017  . Situational anxiety 03/19/2014   R/t grieving process & caregiver burden for husband who has dementia, alzheimer's.   Marland Kitchen Uterine fibroid 11/2012   See Dr. Cletis Media note, surgery 11/2012; calcified- multiple  . Vaginitis 09/13/2011    Allergies  Allergen Reactions  . Penicillins Rash    Has patient had a PCN reaction causing immediate rash, facial/tongue/throat swelling, SOB or lightheadedness with hypotension: no Has patient had a PCN reaction causing severe rash involving mucus membranes or skin necrosis: yes Has patient had a PCN reaction that required hospitalization: no Has patient had a PCN reaction occurring within the last 10 years: no If all of the above answers are "NO", then may proceed with Cephalosporin use.   . Sulfa Antibiotics Rash    Under arm and thighs     Review of Systems:  No headache, visual changes, nausea, vomiting, diarrhea, constipation, dizziness, abdominal pain, skin rash, fevers, chills, night sweats, weight loss, swollen lymph nodes, body aches, joint swelling, chest pain, shortness of breath, mood changes. POSITIVE muscle aches  Objective  Blood pressure 100/60, pulse 83, height 5\' 5"  (1.651 m), weight 106 lb (48.1 kg), SpO2 96 %.   General: No apparent distress alert and oriented x3 mood and affect normal, dressed appropriately.  HEENT: Pupils equal, extraocular movements intact  Respiratory: Patient's speak in full sentences and does not appear short of breath  Cardiovascular: No lower extremity edema, non tender,  no erythema   MSK:  Non tender with full range of motion and good stability and symmetric strength and tone of shoulders, elbows, wrist, hip, knee and ankles bilaterally.  Back -low back exam has significant loss of lordosis with some degenerative scoliosis. Limited range of motion in all planes. Tightness with FABER test. Patient has tightness with straight leg test but no radicular symptoms. Worsening pain noted with  extension of the back  Osteopathic findings   T8 extended rotated and side bent left L2 flexed rotated and side bent right  L5 flexed rotated and side bent left Sacrum right on right       Assessment and Plan:  Degenerative lumbar spinal stenosis Patient will start with formal physical therapy in 2 weeks.  I think will be beneficial.  Patient does need more time to herself at this moment.  We discussed icing regimen.  Patient is responding well to manipulation follow-up again in 4 weeks    Nonallopathic problems  Decision today to treat with OMT was based on Physical Exam  After verbal consent patient was treated with  ME, FPR techniques in , thoracic, lumbar, and sacral  areas  Patient tolerated the procedure well with improvement in symptoms  Patient given exercises, stretches and lifestyle modifications  See medications in patient instructions if given  Patient will follow up in 4 weeks      The above documentation has been reviewed and is accurate and complete Lyndal Pulley, DO       Note: This dictation was prepared with Dragon dictation along with smaller phrase technology. Any transcriptional errors that result from this process are unintentional.

## 2020-05-19 ENCOUNTER — Ambulatory Visit (INDEPENDENT_AMBULATORY_CARE_PROVIDER_SITE_OTHER): Payer: Medicare PPO | Admitting: Family Medicine

## 2020-05-19 ENCOUNTER — Encounter: Payer: Self-pay | Admitting: Family Medicine

## 2020-05-19 ENCOUNTER — Other Ambulatory Visit: Payer: Self-pay

## 2020-05-19 VITALS — BP 100/60 | HR 83 | Ht 65.0 in | Wt 106.0 lb

## 2020-05-19 DIAGNOSIS — M999 Biomechanical lesion, unspecified: Secondary | ICD-10-CM | POA: Diagnosis not present

## 2020-05-19 DIAGNOSIS — M48061 Spinal stenosis, lumbar region without neurogenic claudication: Secondary | ICD-10-CM

## 2020-05-19 NOTE — Assessment & Plan Note (Signed)
Patient will start with formal physical therapy in 2 weeks.  I think will be beneficial.  Patient does need more time to herself at this moment.  We discussed icing regimen.  Patient is responding well to manipulation follow-up again in 4 weeks

## 2020-05-19 NOTE — Patient Instructions (Addendum)
Good to see you Therapy would be great  See me again in 4-8 weeks

## 2020-05-31 ENCOUNTER — Other Ambulatory Visit: Payer: Self-pay

## 2020-05-31 ENCOUNTER — Ambulatory Visit
Admission: RE | Admit: 2020-05-31 | Discharge: 2020-05-31 | Disposition: A | Discharge status: Home / Self Care | Payer: Medicare PPO | Source: Ambulatory Visit | Attending: Family Medicine | Admitting: Family Medicine

## 2020-05-31 DIAGNOSIS — Z1231 Encounter for screening mammogram for malignant neoplasm of breast: Secondary | ICD-10-CM

## 2020-06-07 ENCOUNTER — Ambulatory Visit: Payer: Medicare PPO | Attending: Family Medicine

## 2020-06-07 ENCOUNTER — Other Ambulatory Visit: Payer: Self-pay

## 2020-06-07 DIAGNOSIS — M545 Low back pain, unspecified: Secondary | ICD-10-CM | POA: Insufficient documentation

## 2020-06-07 DIAGNOSIS — M533 Sacrococcygeal disorders, not elsewhere classified: Secondary | ICD-10-CM | POA: Diagnosis not present

## 2020-06-07 DIAGNOSIS — M47818 Spondylosis without myelopathy or radiculopathy, sacral and sacrococcygeal region: Secondary | ICD-10-CM | POA: Insufficient documentation

## 2020-06-07 DIAGNOSIS — G8929 Other chronic pain: Secondary | ICD-10-CM

## 2020-06-07 NOTE — Therapy (Signed)
[place], [place], [address] Phone: [phone]   Fax:  [phone]  Physical Therapy Evaluation  Patient Details  Name: Kelsey [name] MRN: [id number] Date of Birth: 1946/05/10 Referring Provider (PT): [name], DO   Encounter Date: 06/07/2020    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   Bladder prolapse, female, acquired    Chicken pox as a child   Dysuria 09/13/2011   Female bladder prolapse 09/13/2011   Fibrocystic breast 09/14/2011   GERD (gastroesophageal reflux disease)    H/O measles    H/O mumps    Hyperlipidemia    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    Uterine fibroid 11/2012   See Dr. Cletis Media note, surgery 11/2012; calcified- multiple   Vaginitis 09/13/2011    Past Surgical History:  Procedure Laterality Date   DILATATION & CURRETTAGE/HYSTEROSCOPY WITH RESECTOCOPE N/A 11/06/2012   Procedure: [place];  Surgeon: Alwyn Pea, MD;  Location: [facility];  Service: Gynecology;  Laterality: N/A;   HERNIA REPAIR  1992   small intestine hernia  1994   WISDOM TOOTH EXTRACTION      There were no vitals filed for this visit.    Subjective Assessment - 06/07/20 1020    Subjective Pt reports she has a lot of right lower back pain. She has had 2 CSI, the 2nd lasting for 3 months (more than a year ago, she does not want to dependent on them) and making her feel very good. She has had back pain since she was 74 yo. She was playing cards with her family all day on New Year's and had difficulty  standing up. It has been off and on for her whole life, but lately it's all the time. She has Osteoporosis in the same area,              [facility] PT Assessment - 06/07/20 0001      Assessment   Medical Diagnosis Arthritis of right sacroiliac joint, SIJ dysfunction    Referring Provider (PT) [name], DO    Onset Date/Surgical Date --   unknown   Hand Dominance Right    Next MD Visit 06/17/20    Prior Therapy Yes, at [facility] for Vertigo      Precautions   Precautions None      Restrictions   Weight Bearing Restrictions No      Balance Screen   Has the patient fallen in the past 6 months No   "I'm so careful"     [place] residence    Living Arrangements Spouse/significant other    Type of [place] to enter    [place] of Steps 15    Alternate Level Stairs-Rails Right    Additional Comments takes care of husband with late stage Alzheimer's      Prior Function   Level of Independence Independent    Vocation Retired    Leisure  Walks her dog, swims      ROM / Strength   AROM / PROM / Strength AROM      AROM   AROM Assessment Site Lumbar    Lumbar Flexion WFL, hands to floor    Lumbar Extension 15    Lumbar - Right Side Bend 12    Lumbar - Left Side Bend 12    Lumbar - Right Rotation 25% limited    Lumbar - Left Rotation 75% limited                      Objective measurements completed on examination: See above findings.                 PT Short Term Goals - 06/07/20 1210      PT SHORT TERM GOAL #1   Title Pt will be I and compliant with initial HEP.    Time 2    Period Weeks    Status New    Target Date 06/21/20      PT SHORT TERM GOAL #2   Title FOTO will be taken by visit 3 with longterm goal created.    Baseline no FOTO    Time 2    Period Weeks    Status New    Target Date 06/21/20             PT  Long Term Goals - 06/07/20 1211      PT LONG TERM GOAL #1   Title Pt will demonstrate bilateral lumbar flexion to 20 degrees or fingertips just above patellae.    Time 8    Period Weeks    Status New    Target Date 08/02/20      PT LONG TERM GOAL #2   Title Pt will demonstate symmetrical thoracolumbar rotation in standing.    Baseline R 25% limitation, L 75% limitation    Time 8    Period Weeks    Status New    Target Date 08/02/20      PT LONG TERM GOAL #3   Title Pt will report decrease in pain level with transfers and sitting, demonstrating smoother transitions to upright posture.    Time 8    Period Weeks    Status New    Target Date 08/02/20      PT LONG TERM GOAL #4   Title Pt will return to prior levels of physical activity, including swimming or zumba, at least 3x/week.    Time 8    Period Weeks    Status New    Target Date 08/02/20                  Plan - 06/07/20 1200    Clinical Impression Statement Pt is a 74 yo female with longterm history of right-sided LBP with recent worsening. She c/o more frequent pain than she used to have, particularly with prolonged sitting and sit to stand transfers taking increased time to extend spine and move pelvis into neutral alignment. She demonstrates decreased lumbar extension, bilateral lateral flexion, and left rotation, as well as squat/sit to stand with compensation of knee valgus B and anterior translation affecting dynamic balance. Pt is frail in appearance and reports history of osteoporosis with next scan unknown. Pt was educated on HEP, POC, diagnosis, and prognosis verbalizing understanding and consent to tx. She will benefit from skilled PT 2x/week for 1st 3 weeks and 1x/week for 3-5 weeks to address impairments.  Personal Factors and Comorbidities Age;Comorbidity 3+;Past/Current Experience;Time since onset of injury/illness/exacerbation    Comorbidities Arthritis, Osteoporosis, HLD, CKD    Examination-Activity  Limitations Sit;Squat;Transfers;Other   sit to stand   Examination-Participation Restrictions Community Activity    Stability/Clinical Decision Making Evolving/Moderate complexity    Clinical Decision Making Moderate    Rehab Potential Good    PT Frequency --   2x/week 1st 3 weeks, 1x/week 3-5 weeks   PT Duration --   6-8 weeks   PT Treatment/Interventions ADLs/Self Care Home Management;Functional mobility training;Neuromuscular re-education;Spinal Manipulations;Joint Manipulations;Other (comment);Dry needling;Passive range of motion;Manual techniques;Patient/family education;Therapeutic activities;Therapeutic exercise;Balance training;Iontophoresis 4mg /ml Dexamethasone;Moist Heat;Cryotherapy;Electrical Stimulation   Mobilizations versus manipulations   PT Next Visit Plan Lumbar FOTO!!, assess response to HEP, progress spinal mobility and right sidebody/QL flexibility, incorporate lumbopelvic movement/rhythm and core stab, extension    PT Home Exercise Plan [id number]: open books, standing QL stretch at doorway, QL mobilization with tennis ball at wall    Consulted and Agree with Plan of Care Patient           Patient will benefit from skilled therapeutic intervention in order to improve the following deficits and impairments:  Postural dysfunction, Pain, Decreased range of motion, Impaired perceived functional ability, Improper body mechanics, Hypomobility  Visit Diagnosis: Arthritis of right sacroiliac joint - Plan: PT plan of care cert/re-cert  Sacroiliac dysfunction - Plan: PT plan of care cert/re-cert  Chronic right-sided low back pain without sciatica - Plan: PT plan of care cert/re-cert     Problem List Patient Active Problem List   Diagnosis Date Noted   Cellulitis of finger of left hand 03/11/2020   Dizziness 02/12/2020   Vaginal discharge 10/06/2019   RUQ discomfort 10/06/2019   Female bladder prolapse 12/26/2018   Degenerative lumbar spinal stenosis 08/19/2018    [facility] arthritis 03/13/2018   De Quervain's tenosynovitis, left 12/11/2017   Arthritis of right sacroiliac joint 02/26/2017   Nonallopathic lesion of lumbosacral region 01/31/2017   Nonallopathic lesion of sacral region 01/03/2017   Nonallopathic lesion of thoracic region 01/03/2017   Cervical somatic dysfunction 12/07/2016   Pelvic somatic dysfunction 12/07/2016   SI (sacroiliac) joint dysfunction 12/07/2016   Sciatica of right side 12/07/2016   Acquired deformity of right toe 12/07/2016   Abnormal mini-mental status exam 07/05/2016   Leg length discrepancy 01/25/2014   History of iron deficiency anemia 12/02/2013   Caregiver stress 12/02/2013   Vitamin D deficiency 12/03/2012   Fibrocystic breast 09/14/2011   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 [place], [place], [address] Phone: [phone]   Fax:  [phone]  Name: Kelsey [name] MRN: [id number] Date of Birth: [birth year]

## 2020-06-14 ENCOUNTER — Ambulatory Visit: Payer: Medicare PPO | Attending: Family Medicine

## 2020-06-14 ENCOUNTER — Other Ambulatory Visit: Payer: Self-pay

## 2020-06-14 DIAGNOSIS — G8929 Other chronic pain: Secondary | ICD-10-CM | POA: Diagnosis not present

## 2020-06-14 DIAGNOSIS — M545 Low back pain, unspecified: Secondary | ICD-10-CM

## 2020-06-14 DIAGNOSIS — M47818 Spondylosis without myelopathy or radiculopathy, sacral and sacrococcygeal region: Secondary | ICD-10-CM

## 2020-06-14 DIAGNOSIS — M461 Sacroiliitis, not elsewhere classified: Secondary | ICD-10-CM

## 2020-06-14 DIAGNOSIS — M533 Sacrococcygeal disorders, not elsewhere classified: Secondary | ICD-10-CM | POA: Diagnosis not present

## 2020-06-14 NOTE — Therapy (Signed)
[place], [place], [address] Phone: [phone]   Fax:  [phone]  Physical Therapy Treatment  Patient Details  Name: Kelsey [name] MRN: [id number] Date of Birth: 1945-10-16 Referring Provider (PT): [name], DO   Encounter Date: 06/14/2020   PT End of Session - 06/14/20 0950    Visit Number 2    Number of Visits 12    Date for PT Re-Evaluation 08/08/20    Authorization Type [employer]    Progress Note Due on Visit 10    PT Start Time 0945    PT Stop Time 1030    PT Time Calculation (min) 45 min    Activity Tolerance Patient tolerated treatment well    Behavior During Therapy [facility] for tasks assessed/performed           Past Medical History:  Diagnosis Date  . Allergy   . Anemia    iron deficiency  . 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 Bladder prolapse, female, acquired   . Chicken pox as a child  . Dysuria 09/13/2011  . Female bladder prolapse 09/13/2011  . Fibrocystic breast 09/14/2011  . GERD (gastroesophageal reflux disease)   . H/O measles   . H/O mumps   . Hyperlipidemia   . 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 Osteoarthrosis    right shoulder  . Osteoporosis    reclast in Oct  . Ovarian cyst 09/14/2011  . Renal cyst left   3 cm  . [facility] spotted fever) 2017  . Situational anxiety 03/19/2014   R/t grieving process & caregiver burden for husband who has dementia, alzheimer's.   Marland Kitchen Uterine fibroid 11/2012   See Dr. Cletis Media note, surgery 11/2012; calcified- multiple  . Vaginitis 09/13/2011    Past Surgical History:  Procedure Laterality Date  . DILATATION & CURRETTAGE/HYSTEROSCOPY WITH RESECTOCOPE N/A 11/06/2012   Procedure: DILATATION & CURETTAGE/HYSTEROSCOPY WITH RESECTOCOPE;  Surgeon: Alwyn Pea, MD;  Location: [facility];  Service: Gynecology;  Laterality: N/A;  . HERNIA REPAIR  1992  . small intestine hernia  1994  . WISDOM TOOTH EXTRACTION      There were no  vitals filed for this visit.   Subjective Assessment - 06/14/20 0946    Subjective Patient reports her back pain is feeling about the same as it did on initial evaluation. She reports compliance with HEP.    Currently in Pain? Yes    Pain Score 6     Pain Location Back    Pain Orientation Right;Lower    Pain Descriptors / Indicators Other (Comment)   knot   Pain Type Chronic pain    Pain Onset More than a month ago    Pain Frequency Constant              [facility] PT Assessment - 06/14/20 0001      Other:   Other/ Comments FOTO: 43% function; 55% predicted                         [facility] Adult PT Treatment/Exercise - 06/14/20 0001      Self-Care   Other Self-Care Comments  see patient education      Lumbar Exercises: Stretches   Piriformis Stretch Limitations 1 x 30" each      Lumbar Exercises: Supine   Pelvic Tilt Limitations 2 x 10    Clam Limitations 2 x 10 green TB    Bridge Limitations 2 x  10    Other Supine Lumbar Exercises LTR 2 min      Manual Therapy   Manual therapy comments STM/DTM R lumbar paraspinals, glute max/med, piriformis                  PT Education - 06/14/20 1009    Education Details Education on FOTO score and anticipated improvement. Education on updated HEP to include LTR, hip abduction, pelvic tilt, and piriformis stretch    Person(s) Educated Patient    Methods Explanation;Demonstration;Verbal cues;Handout    Comprehension Verbalized understanding;Returned demonstration;Need further instruction            PT Short Term Goals - 06/14/20 1048      PT SHORT TERM GOAL #1   Title Pt will be I and compliant with initial HEP.    Time 2    Period Weeks    Status On-going    Target Date 06/21/20      PT SHORT TERM GOAL #2   Title FOTO will be taken by visit 3 with longterm goal created.    Baseline 43% on 06/14/20    Time 2    Period Weeks    Status Achieved             PT Long Term Goals - 06/14/20 1050        PT LONG TERM GOAL #1   Title Pt will demonstrate bilateral lumbar flexion to 20 degrees or fingertips just above patellae.    Time 8    Period Weeks    Status New    Target Date 08/02/20      PT LONG TERM GOAL #2   Title Pt will demonstate symmetrical thoracolumbar rotation in standing.    Baseline R 25% limitation, L 75% limitation    Time 8    Period Weeks    Status New    Target Date 08/02/20      PT LONG TERM GOAL #3   Title Pt will report decrease in pain level with transfers and sitting, demonstrating smoother transitions to upright posture.    Time 8    Period Weeks    Status New    Target Date 08/02/20      PT LONG TERM GOAL #4   Title Pt will return to prior levels of physical activity, including swimming or zumba, at least 3x/week.    Time 8    Period Weeks    Status New    Target Date 08/02/20      PT LONG TERM GOAL #5   Title Patient will score at least 55% function on the FOTO to signify clinically meaningful improvement in functional abilities.    Baseline 43%    Time 8    Period Weeks    Status New    Target Date 08/02/20                 Plan - 06/14/20 1041    Clinical Impression Statement Patient has significant tautness and palpable tenderness about glute med with partial release from manual therapy. Able to progress trunk mobility and introduce hip/core strengthening with patient requiring intermittent cues for proper pelvic alignment. Patient was educated on FOTO score and anticipated functional improvement and verbalized understanding, though will benefit from further review at future sessions. Patient reported a reduction in pain (4/10) and less stiffness at conclusion of session.    Personal Factors and Comorbidities Age;Comorbidity 3+;Past/Current Experience;Time since onset of injury/illness/exacerbation    Comorbidities Arthritis,  Osteoporosis, HLD, CKD    PT Treatment/Interventions ADLs/Self Care Home Management;Functional mobility  training;Neuromuscular re-education;Spinal Manipulations;Joint Manipulations;Other (comment);Dry needling;Passive range of motion;Manual techniques;Patient/family education;Therapeutic activities;Therapeutic exercise;Balance training;Iontophoresis 4mg /ml Dexamethasone;Moist Heat;Cryotherapy;Electrical Stimulation    PT Next Visit Plan Review FOTO score, continue with manual therapy as needed, progress hip and core strengthening as tolerated.    PT Home Exercise Plan [id number]: open books, standing QL stretch at doorway, QL mobilization with tennis ball at wall, LTR, figure 4 stretch, hip abduction, pelvic tilt    Consulted and Agree with Plan of Care Patient           Patient will benefit from skilled therapeutic intervention in order to improve the following deficits and impairments:  Postural dysfunction, Pain, Decreased range of motion, Impaired perceived functional ability, Improper body mechanics, Hypomobility  Visit Diagnosis: Arthritis of right sacroiliac joint  Sacroiliac dysfunction  Chronic right-sided low back pain without sciatica     Problem List Patient Active Problem List   Diagnosis Date Noted  . Cellulitis of finger of left hand 03/11/2020  . Dizziness 02/12/2020  . Vaginal discharge 10/06/2019  . RUQ discomfort 10/06/2019  . Female bladder prolapse 12/26/2018  . Degenerative lumbar spinal stenosis 08/19/2018  . [place] arthritis 03/13/2018  . De Quervain's tenosynovitis, left 12/11/2017  . Arthritis of right sacroiliac joint 02/26/2017  . Nonallopathic lesion of lumbosacral region 01/31/2017  . Nonallopathic lesion of sacral region 01/03/2017  . Nonallopathic lesion of thoracic region 01/03/2017  . Cervical somatic dysfunction 12/07/2016  . Pelvic somatic dysfunction 12/07/2016  . SI (sacroiliac) joint dysfunction 12/07/2016  . Sciatica of right side 12/07/2016  . Acquired deformity of right toe 12/07/2016  . 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 Leg  length discrepancy 01/25/2014  . History of iron deficiency anemia 12/02/2013  . Caregiver stress 12/02/2013  . Vitamin D deficiency 12/03/2012  . Fibrocystic breast 09/14/2011  . 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 GERD (gastroesophageal reflux disease)   . Osteoporosis    Gwendolyn Grant, PT, DPT, ATC 06/14/20 10:58 AM  [facility] [address] [place], [place], [address] Phone: [phone]   Fax:  [phone]  Name: TIFFINE HENIGAN MRN: [id number] Date of Birth: [birth year]

## 2020-06-16 NOTE — Progress Notes (Signed)
[place] Phone: [phone] Subjective:   [name], am serving as a scribe for Dr. [name]. This visit occurred during the SARS-CoV-2 public health emergency.  Safety protocols were in place, including screening questions prior to the visit, additional usage of staff PPE, and extensive cleaning of exam room while observing appropriate contact time as indicated for disinfecting solutions.   I'm seeing this patient by the request  of:  [name], [name], DO  CC: Back pain follow-up  ALP:[id number]  Kelsey [name] is a 74 y.o. female coming in with complaint of back and neck pain. OMT 05/19/2020. Patient states that she did have 2 sessions of physical therapy which has seemed to help.  Patient continues to be very active.  Does usually yoga, Pilates or something every day as long as she does that can continue to stay active.  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  Medications patient has been prescribed: Vit D Taking: Yes         Reviewed prior external information including notes and imaging from previsou exam, outside providers and external EMR if available.   As well as notes that were available from care everywhere and other healthcare systems.  Past medical history, social, surgical and family history all reviewed in electronic medical record.  No pertanent information unless stated regarding to the chief complaint.   Past Medical History:  Diagnosis Date  . Allergy   . Anemia    iron deficiency  . 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 Bladder prolapse, female, acquired   . Chicken pox as a child  . Dysuria 09/13/2011  . Female bladder prolapse 09/13/2011  . Fibrocystic breast 09/14/2011  . GERD (gastroesophageal reflux disease)   . H/O measles   . H/O mumps   . Hyperlipidemia   . 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 Osteoarthrosis    right shoulder  . Osteoporosis    reclast in Oct   . Ovarian cyst 09/14/2011  . Renal cyst left   3 cm  . [facility] spotted fever) 2017  . Situational anxiety 03/19/2014   R/t grieving process & caregiver burden for husband who has dementia, alzheimer's.   Marland Kitchen Uterine fibroid 11/2012   See Dr. Cletis Media note, surgery 11/2012; calcified- multiple  . Vaginitis 09/13/2011    Allergies  Allergen Reactions  . Penicillins Rash    Has patient had a PCN reaction causing immediate rash, facial/tongue/throat swelling, SOB or lightheadedness with hypotension: no Has patient had a PCN reaction causing severe rash involving mucus membranes or skin necrosis: yes Has patient had a PCN reaction that required hospitalization: no Has patient had a PCN reaction occurring within the last 10 years: no If all of the above answers are "NO", then may proceed with Cephalosporin use.   . Sulfa Antibiotics Rash    Under arm and thighs     Review of Systems:  No headache, visual changes, nausea, vomiting, diarrhea, constipation, dizziness, abdominal pain, skin rash, fevers, chills, night sweats, weight loss, swollen lymph nodes, body aches, joint swelling, chest pain, shortness of breath, mood changes. POSITIVE muscle aches  Objective  Blood pressure 100/72, pulse 77, height 5\' 5"  (1.651 m), weight 107 lb (48.5 kg), SpO2 98 %.   General: No apparent distress alert and oriented x3 mood and affect normal, dressed appropriately.  HEENT: Pupils equal, extraocular movements intact  Respiratory: Patient's speak in full sentences and does not  appear short of breath  Cardiovascular: No lower extremity edema, non tender, no erythema  Significant arthritic changes of some joints but moves very well. Back -back exam significant loss of lordosis.  Does have some tightness in flexion and only has 5 degrees of extension.  Negative Faber negative straight leg test 4-5 strength in lower extremities but symmetric  Osteopathic findings   T9 extended rotated and side  bent left L2 flexed rotated and side bent right Sacrum right on right       Assessment and Plan:    Nonallopathic problems  Decision today to treat with OMT was based on Physical Exam  After verbal consent patient was treated with HVLA, ME, FPR techniques in  thoracic, lumbar, and sacral  areas  Patient tolerated the procedure well with improvement in symptoms  Patient given exercises, stretches and lifestyle modifications  See medications in patient instructions if given  Patient will follow up in 4-8 weeks      The above documentation has been reviewed and is accurate and complete Lyndal Pulley, DO       Note: This dictation was prepared with Dragon dictation along with smaller phrase technology. Any transcriptional errors that result from this process are unintentional.

## 2020-06-17 ENCOUNTER — Ambulatory Visit (INDEPENDENT_AMBULATORY_CARE_PROVIDER_SITE_OTHER): Payer: Medicare PPO | Admitting: Family Medicine

## 2020-06-17 ENCOUNTER — Other Ambulatory Visit: Payer: Self-pay

## 2020-06-17 ENCOUNTER — Encounter: Payer: Self-pay | Admitting: Family Medicine

## 2020-06-17 VITALS — BP 100/72 | HR 77 | Ht 65.0 in | Wt 107.0 lb

## 2020-06-17 DIAGNOSIS — M999 Biomechanical lesion, unspecified: Secondary | ICD-10-CM | POA: Diagnosis not present

## 2020-06-17 NOTE — Patient Instructions (Signed)
Good to see you  [place] Stay active See me agai in 4-5 weeks

## 2020-06-21 ENCOUNTER — Other Ambulatory Visit: Payer: Self-pay

## 2020-06-21 ENCOUNTER — Ambulatory Visit: Payer: Medicare PPO

## 2020-06-21 DIAGNOSIS — G8929 Other chronic pain: Secondary | ICD-10-CM | POA: Diagnosis not present

## 2020-06-21 DIAGNOSIS — M47818 Spondylosis without myelopathy or radiculopathy, sacral and sacrococcygeal region: Secondary | ICD-10-CM

## 2020-06-21 DIAGNOSIS — M533 Sacrococcygeal disorders, not elsewhere classified: Secondary | ICD-10-CM

## 2020-06-21 DIAGNOSIS — M545 Low back pain, unspecified: Secondary | ICD-10-CM | POA: Diagnosis not present

## 2020-06-21 NOTE — Therapy (Signed)
[place], [place], [address] Phone: [phone]   Fax:  [phone]  Physical Therapy Treatment  Patient Details  Name: Kelsey [name] MRN: [id number] Date of Birth: [birth year] Referring Provider (PT): [name], DO   Encounter Date: 06/21/2020   PT End of Session - 06/21/20 1432    Visit Number 3    Number of Visits 12    Date for PT Re-Evaluation 08/08/20    Authorization Type [employer]    Progress Note Due on Visit 10    PT Start Time 1426   pt arrived late   PT Stop Time 1500    PT Time Calculation (min) 34 min    Activity Tolerance Patient tolerated treatment well    Behavior During Therapy [facility] for tasks assessed/performed           Past Medical History:  Diagnosis Date  . Allergy   . Anemia    iron deficiency  . 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 Bladder prolapse, female, acquired   . Chicken pox as a child  . Dysuria 09/13/2011  . Female bladder prolapse 09/13/2011  . Fibrocystic breast 09/14/2011  . GERD (gastroesophageal reflux disease)   . H/O measles   . H/O mumps   . Hyperlipidemia   . 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 Osteoarthrosis    right shoulder  . Osteoporosis    reclast in Oct  . Ovarian cyst 09/14/2011  . Renal cyst left   3 cm  . RMSF [facility] [facility] spotted fever) 2017  . Situational anxiety 03/19/2014   R/t grieving process & caregiver burden for husband who has dementia, alzheimer's.   Marland Kitchen Uterine fibroid 11/2012   See Dr. Cletis Media note, surgery 11/2012; calcified- multiple  . Vaginitis 09/13/2011    Past Surgical History:  Procedure Laterality Date  . DILATATION & CURRETTAGE/HYSTEROSCOPY WITH RESECTOCOPE N/A 11/06/2012   Procedure: DILATATION & CURETTAGE/HYSTEROSCOPY WITH RESECTOCOPE;  Surgeon: Alwyn Pea, MD;  Location: [facility];  Service: Gynecology;  Laterality: N/A;  . HERNIA REPAIR  1992  . small intestine hernia  1994  . WISDOM TOOTH EXTRACTION       There were no vitals filed for this visit.   Subjective Assessment - 06/21/20 1428    Subjective Pt reports she is compliant with her HEP and feels less tight.    Currently in Pain? Yes    Pain Score 3     Pain Location Back    Pain Orientation Right;Lower    Pain Descriptors / Indicators Dull;Aching   annoying   Pain Type Chronic pain    Pain Onset More than a month ago                             [facility] Adult PT Treatment/Exercise - 06/21/20 0001      Lumbar Exercises: Supine   Ab Set --   3x20"   AB Set Limitations in 90/90 in PPT    Bridge Limitations 2x20"    Bridge with March 10 reps    Bridge with [employer] Limitations 2 sets    Other Supine Lumbar Exercises LTR 2 min, added figure 4 with same side lumbar rot'n      Manual Therapy   Manual Therapy Joint mobilization;Soft tissue mobilization;Passive ROM    Manual therapy comments prone    Soft tissue mobilization STM/DTM R lumbar PS, multifidi, QL, glute max/med/min, piriformis  Passive ROM hip IR/ER with piriformis STM/DTM (passive pumping)                  PT Education - 06/21/20 1519    Education Details EDU for 90/90 position with imprinted spine to focus on abdominal engagement, as well as addition of bridge hold + marching.    Person(s) Educated Patient    Methods Explanation;Demonstration;Tactile cues;Verbal cues;Handout    Comprehension Verbalized understanding;Returned demonstration;Verbal cues required;Tactile cues required            PT Short Term Goals - 06/14/20 1048      PT SHORT TERM GOAL #1   Title Pt will be I and compliant with initial HEP.    Time 2    Period Weeks    Status On-going    Target Date 06/21/20      PT SHORT TERM GOAL #2   Title FOTO will be taken by visit 3 with longterm goal created.    Baseline 43% on 06/14/20    Time 2    Period Weeks    Status Achieved             PT Long Term Goals - 06/14/20 1050      PT LONG TERM GOAL #1    Title Pt will demonstrate bilateral lumbar flexion to 20 degrees or fingertips just above patellae.    Time 8    Period Weeks    Status New    Target Date 08/02/20      PT LONG TERM GOAL #2   Title Pt will demonstate symmetrical thoracolumbar rotation in standing.    Baseline R 25% limitation, L 75% limitation    Time 8    Period Weeks    Status New    Target Date 08/02/20      PT LONG TERM GOAL #3   Title Pt will report decrease in pain level with transfers and sitting, demonstrating smoother transitions to upright posture.    Time 8    Period Weeks    Status New    Target Date 08/02/20      PT LONG TERM GOAL #4   Title Pt will return to prior levels of physical activity, including swimming or zumba, at least 3x/week.    Time 8    Period Weeks    Status New    Target Date 08/02/20      PT LONG TERM GOAL #5   Title Patient will score at least 55% function on the FOTO to signify clinically meaningful improvement in functional abilities.    Baseline 43%    Time 8    Period Weeks    Status New    Target Date 08/02/20                 Plan - 06/21/20 1510    Clinical Impression Statement Pt is making gradual progress with reduction in pain to 3/10 at beginning of session today from 6/10 last visit. She reported relief post manual therapy and tolerated initiation of more core strengthening well. Edu for 90/90 position with imprinted spine to activate abdominals versus neutral spine where pt reported feeling low back.    Personal Factors and Comorbidities Age;Comorbidity 3+;Past/Current Experience;Time since onset of injury/illness/exacerbation    Comorbidities Arthritis, Osteoporosis, HLD, CKD    PT Treatment/Interventions ADLs/Self Care Home Management;Functional mobility training;Neuromuscular re-education;Spinal Manipulations;Joint Manipulations;Other (comment);Dry needling;Passive range of motion;Manual techniques;Patient/family education;Therapeutic  activities;Therapeutic exercise;Balance training;Iontophoresis 4mg /ml Dexamethasone;Moist Heat;Cryotherapy;Electrical Stimulation    PT  Next Visit Plan Continue with manual therapy as needed, progress hip and core strengthening as tolerated.    PT Home Exercise Plan [id number]: open books, standing QL stretch at doorway, QL mobilization with tennis ball at wall, LTR, figure 4 stretch, hip abduction, pelvic tilt    Consulted and Agree with Plan of Care Patient           Patient will benefit from skilled therapeutic intervention in order to improve the following deficits and impairments:  Postural dysfunction,Pain,Decreased range of motion,Impaired perceived functional ability,Improper body mechanics,Hypomobility  Visit Diagnosis: Arthritis of right sacroiliac joint  Sacroiliac dysfunction  Chronic right-sided low back pain without sciatica     Problem List Patient Active Problem List   Diagnosis Date Noted  . Cellulitis of finger of left hand 03/11/2020  . Dizziness 02/12/2020  . Vaginal discharge 10/06/2019  . RUQ discomfort 10/06/2019  . Female bladder prolapse 12/26/2018  . Degenerative lumbar spinal stenosis 08/19/2018  . [place] arthritis 03/13/2018  . De Quervain's tenosynovitis, left 12/11/2017  . Arthritis of right sacroiliac joint 02/26/2017  . Nonallopathic lesion of lumbosacral region 01/31/2017  . Nonallopathic lesion of sacral region 01/03/2017  . Nonallopathic lesion of thoracic region 01/03/2017  . Cervical somatic dysfunction 12/07/2016  . Pelvic somatic dysfunction 12/07/2016  . SI (sacroiliac) joint dysfunction 12/07/2016  . Sciatica of right side 12/07/2016  . Acquired deformity of right toe 12/07/2016  . 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 Leg length discrepancy 01/25/2014  . History of iron deficiency anemia 12/02/2013  . Caregiver stress 12/02/2013  . Vitamin D deficiency 12/03/2012  . Fibrocystic breast 09/14/2011  . Hypermobile joints  09/13/2011  . GERD (gastroesophageal reflux disease)   . Osteoporosis     Izell Leon, PT, DPT 06/21/2020, 3:20 PM  [facility] [address] [place], [place], [address] Phone: [phone]   Fax:  [phone]  Name: SOPHEE MCKIMMY MRN: [id number] Date of Birth: 20-Oct-1945

## 2020-06-24 ENCOUNTER — Other Ambulatory Visit: Payer: Self-pay

## 2020-06-24 ENCOUNTER — Ambulatory Visit: Payer: Medicare PPO

## 2020-06-24 DIAGNOSIS — M533 Sacrococcygeal disorders, not elsewhere classified: Secondary | ICD-10-CM | POA: Diagnosis not present

## 2020-06-24 DIAGNOSIS — G8929 Other chronic pain: Secondary | ICD-10-CM | POA: Diagnosis not present

## 2020-06-24 DIAGNOSIS — M47818 Spondylosis without myelopathy or radiculopathy, sacral and sacrococcygeal region: Secondary | ICD-10-CM | POA: Diagnosis not present

## 2020-06-24 DIAGNOSIS — M545 Low back pain, unspecified: Secondary | ICD-10-CM | POA: Diagnosis not present

## 2020-06-24 NOTE — Therapy (Signed)
[place], [place], [address] Phone: [phone]   Fax:  [phone]  Physical Therapy Treatment  Patient Details  Name: Kelsey [name] MRN: [id number] Date of Birth: Sep 17, 1945 Referring Provider (PT): [name], DO   Encounter Date: 06/24/2020   PT End of Session - 06/24/20 0940    Visit Number 4    Number of Visits 12    Date for PT Re-Evaluation 08/08/20    Authorization Type [employer]    Progress Note Due on Visit 10    PT Start Time 0935   pt arrived late   PT Stop Time 1015    PT Time Calculation (min) 40 min    Activity Tolerance Patient tolerated treatment well    Behavior During Therapy [facility] for tasks assessed/performed           Past Medical History:  Diagnosis Date  . Allergy   . Anemia    iron deficiency  . 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 Bladder prolapse, female, acquired   . Chicken pox as a child  . Dysuria 09/13/2011  . Female bladder prolapse 09/13/2011  . Fibrocystic breast 09/14/2011  . GERD (gastroesophageal reflux disease)   . H/O measles   . H/O mumps   . Hyperlipidemia   . 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 Osteoarthrosis    right shoulder  . Osteoporosis    reclast in Oct  . Ovarian cyst 09/14/2011  . Renal cyst left   3 cm  . RMSF [facility] spotted fever) 2017  . Situational anxiety 03/19/2014   R/t grieving process & caregiver burden for husband who has dementia, alzheimer's.   Marland Kitchen Uterine fibroid 11/2012   See Dr. Cletis Media note, surgery 11/2012; calcified- multiple  . Vaginitis 09/13/2011    Past Surgical History:  Procedure Laterality Date  . DILATATION & CURRETTAGE/HYSTEROSCOPY WITH RESECTOCOPE N/A 11/06/2012   Procedure: DILATATION & CURETTAGE/HYSTEROSCOPY WITH RESECTOCOPE;  Surgeon: Alwyn Pea, MD;  Location: [facility];  Service: Gynecology;  Laterality: N/A;  . HERNIA REPAIR  1992  . small intestine hernia  1994  . WISDOM TOOTH EXTRACTION       There were no vitals filed for this visit.   Subjective Assessment - 06/24/20 0938    Subjective Pain is proportionate to how much she does her exercises.    Currently in Pain? Yes    Pain Score 5     Pain Location Back    Pain Orientation Right;Lower    Pain Descriptors / Indicators Aching;Dull    Pain Type Chronic pain    Pain Onset More than a month ago                             [facility] Adult PT Treatment/Exercise - 06/24/20 0001      Lumbar Exercises: Stretches   Lower Trunk Rotation 5 reps;10 seconds      Lumbar Exercises: Supine   AB Set Limitations -->    Bridge with [employer] 15 reps;5 seconds    Bridge with March 20 reps    Bridge with [employer] Limitations + ball ADD, LE extension    Other Supine Lumbar Exercises Blue band LAT PD with march, progressing to straight leg lift to bridging      Manual Therapy   Manual Therapy Joint mobilization;Soft tissue mobilization    Manual therapy comments prone with bolster under ankles    Joint  Mobilization grade 2-3 PAs to lower lumbar spine    Soft tissue mobilization DTM R L/S PS, QL, multifidi                  PT Education - 06/24/20 1022    Education Details addition of bridge + ball and adding march with ball for home use; provided handout and blue band    Person(s) Educated Patient    Methods Explanation;Demonstration;Tactile cues;Verbal cues;Handout    Comprehension Verbalized understanding;Returned demonstration;Verbal cues required;Tactile cues required            PT Short Term Goals - 06/14/20 1048      PT SHORT TERM GOAL #1   Title Pt will be I and compliant with initial HEP.    Time 2    Period Weeks    Status On-going    Target Date 06/21/20      PT SHORT TERM GOAL #2   Title FOTO will be taken by visit 3 with longterm goal created.    Baseline 43% on 06/14/20    Time 2    Period Weeks    Status Achieved             PT Long Term Goals - 06/14/20 1050       PT LONG TERM GOAL #1   Title Pt will demonstrate bilateral lumbar flexion to 20 degrees or fingertips just above patellae.    Time 8    Period Weeks    Status New    Target Date 08/02/20      PT LONG TERM GOAL #2   Title Pt will demonstate symmetrical thoracolumbar rotation in standing.    Baseline R 25% limitation, L 75% limitation    Time 8    Period Weeks    Status New    Target Date 08/02/20      PT LONG TERM GOAL #3   Title Pt will report decrease in pain level with transfers and sitting, demonstrating smoother transitions to upright posture.    Time 8    Period Weeks    Status New    Target Date 08/02/20      PT LONG TERM GOAL #4   Title Pt will return to prior levels of physical activity, including swimming or zumba, at least 3x/week.    Time 8    Period Weeks    Status New    Target Date 08/02/20      PT LONG TERM GOAL #5   Title Patient will score at least 55% function on the FOTO to signify clinically meaningful improvement in functional abilities.    Baseline 43%    Time 8    Period Weeks    Status New    Target Date 08/02/20                 Plan - 06/24/20 0941    Clinical Impression Statement Pt presents with report of feeling very good after her last visit and making the connection that performing her HEP makes her feel better, but admits she is not as consistent as she needs to be. She required less verbal cueing for proper form today with supine core exercises. Blue theraband provided for home use to progress exercise, as appropriate and able.    Personal Factors and Comorbidities Age;Comorbidity 3+;Past/Current Experience;Time since onset of injury/illness/exacerbation    Comorbidities Arthritis, Osteoporosis, HLD, CKD    PT Treatment/Interventions ADLs/Self Care Home Management;Functional mobility training;Neuromuscular re-education;Spinal Manipulations;Joint Manipulations;Other (comment);Dry needling;Passive  range of motion;Manual  techniques;Patient/family education;Therapeutic activities;Therapeutic exercise;Balance training;Iontophoresis 4mg /ml Dexamethasone;Moist Heat;Cryotherapy;Electrical Stimulation    PT Next Visit Plan Continue with manual therapy as needed, progress hip and core strengthening as tolerated, add sidelying hip work, progress to QP core (arm, leg reaches)    PT Home Exercise Plan [email or web address]: open books, standing QL stretch at doorway, QL mobilization with tennis ball at wall, LTR, figure 4 stretch, hip abduction, pelvic tilt    Consulted and Agree with Plan of Care Patient           Patient will benefit from skilled therapeutic intervention in order to improve the following deficits and impairments:  Postural dysfunction,Pain,Decreased range of motion,Impaired perceived functional ability,Improper body mechanics,Hypomobility  Visit Diagnosis: Arthritis of right sacroiliac joint  Sacroiliac dysfunction  Chronic right-sided low back pain without sciatica     Problem List Patient Active Problem List   Diagnosis Date Noted  . Cellulitis of finger of left hand 03/11/2020  . Dizziness 02/12/2020  . Vaginal discharge 10/06/2019  . RUQ discomfort 10/06/2019  . Female bladder prolapse 12/26/2018  . Degenerative lumbar spinal stenosis 08/19/2018  . [place] arthritis 03/13/2018  . De Quervain's tenosynovitis, left 12/11/2017  . Arthritis of right sacroiliac joint 02/26/2017  . Nonallopathic lesion of lumbosacral region 01/31/2017  . Nonallopathic lesion of sacral region 01/03/2017  . Nonallopathic lesion of thoracic region 01/03/2017  . Cervical somatic dysfunction 12/07/2016  . Pelvic somatic dysfunction 12/07/2016  . SI (sacroiliac) joint dysfunction 12/07/2016  . Sciatica of right side 12/07/2016  . Acquired deformity of right toe 12/07/2016  . 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 Leg length discrepancy 01/25/2014  . History of iron deficiency anemia 12/02/2013  . Caregiver stress  12/02/2013  . Vitamin D deficiency 12/03/2012  . Fibrocystic breast 09/14/2011  . 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 GERD (gastroesophageal reflux disease)   . Osteoporosis     Izell [place], PT, DPT 06/24/2020, 10:23 AM  [facility] [address] [place], [place], [address] Phone: [phone]   Fax:  [phone]  Name: Kelsey [name] MRN: [id number] Date of Birth: Dec 06, 1945

## 2020-06-27 ENCOUNTER — Ambulatory Visit: Payer: Medicare PPO

## 2020-06-27 ENCOUNTER — Other Ambulatory Visit: Payer: Self-pay

## 2020-06-27 DIAGNOSIS — M533 Sacrococcygeal disorders, not elsewhere classified: Secondary | ICD-10-CM | POA: Diagnosis not present

## 2020-06-27 DIAGNOSIS — M47818 Spondylosis without myelopathy or radiculopathy, sacral and sacrococcygeal region: Secondary | ICD-10-CM

## 2020-06-27 DIAGNOSIS — M545 Low back pain, unspecified: Secondary | ICD-10-CM | POA: Diagnosis not present

## 2020-06-27 DIAGNOSIS — G8929 Other chronic pain: Secondary | ICD-10-CM

## 2020-06-27 NOTE — Therapy (Signed)
[place], [place], [address] Phone: [phone]   Fax:  [phone]  Physical Therapy Treatment  Patient Details  Name: Kelsey [name] MRN: [id number] Date of Birth: Nov 15, 1945 Referring Provider (PT): [name], DO   Encounter Date: 06/27/2020   PT End of Session - 06/27/20 1003    Visit Number 5    Number of Visits 12    Date for PT Re-Evaluation 08/08/20    Authorization Type [employer]    Progress Note Due on Visit 10    PT Start Time [phone]    PT Stop Time 1015    PT Time Calculation (min) 38 min    Activity Tolerance Patient tolerated treatment well    Behavior During Therapy [facility] for tasks assessed/performed           Past Medical History:  Diagnosis Date  . Allergy   . Anemia    iron deficiency  . 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 Bladder prolapse, female, acquired   . Chicken pox as a child  . Dysuria 09/13/2011  . Female bladder prolapse 09/13/2011  . Fibrocystic breast 09/14/2011  . GERD (gastroesophageal reflux disease)   . H/O measles   . H/O mumps   . Hyperlipidemia   . 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 Osteoarthrosis    right shoulder  . Osteoporosis    reclast in Oct  . Ovarian cyst 09/14/2011  . Renal cyst left   3 cm  . RMSF [facility] spotted fever) 2017  . Situational anxiety 03/19/2014   R/t grieving process & caregiver burden for husband who has dementia, alzheimer's.   Marland Kitchen Uterine fibroid 11/2012   See Dr. Cletis Media note, surgery 11/2012; calcified- multiple  . Vaginitis 09/13/2011    Past Surgical History:  Procedure Laterality Date  . DILATATION & CURRETTAGE/HYSTEROSCOPY WITH RESECTOCOPE N/A 11/06/2012   Procedure: DILATATION & CURETTAGE/HYSTEROSCOPY WITH RESECTOCOPE;  Surgeon: Alwyn Pea, MD;  Location: [facility];  Service: Gynecology;  Laterality: N/A;  . HERNIA REPAIR  1992  . small intestine hernia  1994  . WISDOM TOOTH EXTRACTION      There were no  vitals filed for this visit.   Subjective Assessment - 06/27/20 0938    Subjective Patient reports stiffness upon waking this morning, but it is better since waking up. She reports discomfort along R side of low back. She admits to not completing exercises over the weekend, but does them during the weekdays.    Currently in Pain? Yes    Pain Score 4     Pain Location Back    Pain Orientation Right;Lower    Pain Descriptors / Indicators Aching;Dull    Pain Type Chronic pain                             [facility] Adult PT Treatment/Exercise - 06/27/20 0001      Lumbar Exercises: Sidelying   Clam Limitations 2 x 10      Lumbar Exercises: Prone   Other Prone Lumbar Exercises piriformis isometric ball squeeze 2 x 10; 5 sec hold      Knee/Hip Exercises: Supine   Bridges with Ball Squeeze 2 sets;10 reps      Manual Therapy   Manual therapy comments muscle energy technique to improve pelvic symmetry    Soft tissue mobilization DTM to R lumbar paraspinals/QL, and glute max.  PT Education - 06/27/20 1011    Education Details Review of current HEP.    Person(s) Educated Patient    Methods Explanation    Comprehension Verbalized understanding            PT Short Term Goals - 06/14/20 1048      PT SHORT TERM GOAL #1   Title Pt will be I and compliant with initial HEP.    Time 2    Period Weeks    Status On-going    Target Date 06/21/20      PT SHORT TERM GOAL #2   Title FOTO will be taken by visit 3 with longterm goal created.    Baseline 43% on 06/14/20    Time 2    Period Weeks    Status Achieved             PT Long Term Goals - 06/14/20 1050      PT LONG TERM GOAL #1   Title Pt will demonstrate bilateral lumbar flexion to 20 degrees or fingertips just above patellae.    Time 8    Period Weeks    Status New    Target Date 08/02/20      PT LONG TERM GOAL #2   Title Pt will demonstate symmetrical thoracolumbar rotation in  standing.    Baseline R 25% limitation, L 75% limitation    Time 8    Period Weeks    Status New    Target Date 08/02/20      PT LONG TERM GOAL #3   Title Pt will report decrease in pain level with transfers and sitting, demonstrating smoother transitions to upright posture.    Time 8    Period Weeks    Status New    Target Date 08/02/20      PT LONG TERM GOAL #4   Title Pt will return to prior levels of physical activity, including swimming or zumba, at least 3x/week.    Time 8    Period Weeks    Status New    Target Date 08/02/20      PT LONG TERM GOAL #5   Title Patient will score at least 55% function on the FOTO to signify clinically meaningful improvement in functional abilities.    Baseline 43%    Time 8    Period Weeks    Status New    Target Date 08/02/20                 Plan - 06/27/20 0957    Clinical Impression Statement Patient has notable R anterior rotated innominate that easily corrects with muscle energy techniques at beginning of session. Light progression of hip strengthening with patient having moderate difficulty maintaining pelvic stability with targeted hip abductor strengthening.    Personal Factors and Comorbidities Age;Comorbidity 3+;Past/Current Experience;Time since onset of injury/illness/exacerbation    Comorbidities Arthritis, Osteoporosis, HLD, CKD    PT Treatment/Interventions ADLs/Self Care Home Management;Functional mobility training;Neuromuscular re-education;Spinal Manipulations;Joint Manipulations;Other (comment);Dry needling;Passive range of motion;Manual techniques;Patient/family education;Therapeutic activities;Therapeutic exercise;Balance training;Iontophoresis 4mg /ml Dexamethasone;Moist Heat;Cryotherapy;Electrical Stimulation    PT Next Visit Plan Progress hip and core strength. Assess pelvic symmetry.    PT Home Exercise Plan [id number]: open books, standing QL stretch at doorway, QL mobilization with tennis ball at wall, LTR,  figure 4 stretch, hip abduction, pelvic tilt    Consulted and Agree with Plan of Care Patient           Patient will benefit from skilled  therapeutic intervention in order to improve the following deficits and impairments:  Postural dysfunction,Pain,Decreased range of motion,Impaired perceived functional ability,Improper body mechanics,Hypomobility  Visit Diagnosis: Arthritis of right sacroiliac joint  Sacroiliac dysfunction  Chronic right-sided low back pain without sciatica     Problem List Patient Active Problem List   Diagnosis Date Noted  . Cellulitis of finger of left hand 03/11/2020  . Dizziness 02/12/2020  . Vaginal discharge 10/06/2019  . RUQ discomfort 10/06/2019  . Female bladder prolapse 12/26/2018  . Degenerative lumbar spinal stenosis 08/19/2018  . [place] arthritis 03/13/2018  . De Quervain's tenosynovitis, left 12/11/2017  . Arthritis of right sacroiliac joint 02/26/2017  . Nonallopathic lesion of lumbosacral region 01/31/2017  . Nonallopathic lesion of sacral region 01/03/2017  . Nonallopathic lesion of thoracic region 01/03/2017  . Cervical somatic dysfunction 12/07/2016  . Pelvic somatic dysfunction 12/07/2016  . SI (sacroiliac) joint dysfunction 12/07/2016  . Sciatica of right side 12/07/2016  . Acquired deformity of right toe 12/07/2016  . 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 Leg length discrepancy 01/25/2014  . History of iron deficiency anemia 12/02/2013  . Caregiver stress 12/02/2013  . Vitamin D deficiency 12/03/2012  . Fibrocystic breast 09/14/2011  . 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 GERD (gastroesophageal reflux disease)   . Osteoporosis    Gwendolyn Grant, PT, DPT, ATC 06/27/20 10:28 AM [facility] [address] [place], [place], [address] Phone: [phone]   Fax:  [phone]  Name: Kelsey [name] MRN: [id number] Date of Birth: [birth year]

## 2020-06-28 ENCOUNTER — Ambulatory Visit: Payer: Medicare PPO

## 2020-06-29 ENCOUNTER — Other Ambulatory Visit: Payer: Self-pay

## 2020-06-29 ENCOUNTER — Ambulatory Visit: Payer: Medicare PPO

## 2020-06-29 DIAGNOSIS — M533 Sacrococcygeal disorders, not elsewhere classified: Secondary | ICD-10-CM | POA: Diagnosis not present

## 2020-06-29 DIAGNOSIS — G8929 Other chronic pain: Secondary | ICD-10-CM

## 2020-06-29 DIAGNOSIS — M47818 Spondylosis without myelopathy or radiculopathy, sacral and sacrococcygeal region: Secondary | ICD-10-CM | POA: Diagnosis not present

## 2020-06-29 DIAGNOSIS — M545 Low back pain, unspecified: Secondary | ICD-10-CM | POA: Diagnosis not present

## 2020-06-29 NOTE — Therapy (Signed)
[place], [place], [address] Phone: [phone]   Fax:  [phone]  Physical Therapy Treatment  Patient Details  Name: Kelsey [name] MRN: [id number] Date of Birth: 1945/10/25 Referring Provider (PT): [name], DO   Encounter Date: 06/29/2020   PT End of Session - 06/29/20 0951    Visit Number 6    Number of Visits 12    Date for PT Re-Evaluation 08/08/20    Authorization Type [employer]    Progress Note Due on Visit 10    PT Start Time 0935    PT Stop Time 1015    PT Time Calculation (min) 40 min    Activity Tolerance Patient tolerated treatment well    Behavior During Therapy [facility] for tasks assessed/performed           Past Medical History:  Diagnosis Date  . Allergy   . Anemia    iron deficiency  . 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 Bladder prolapse, female, acquired   . Chicken pox as a child  . Dysuria 09/13/2011  . Female bladder prolapse 09/13/2011  . Fibrocystic breast 09/14/2011  . GERD (gastroesophageal reflux disease)   . H/O measles   . H/O mumps   . Hyperlipidemia   . 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 Osteoarthrosis    right shoulder  . Osteoporosis    reclast in Oct  . Ovarian cyst 09/14/2011  . Renal cyst left   3 cm  . RMSF [facility] spotted fever) 2017  . Situational anxiety 03/19/2014   R/t grieving process & caregiver burden for husband who has dementia, alzheimer's.   Marland Kitchen Uterine fibroid 11/2012   See Dr. Cletis Media note, surgery 11/2012; calcified- multiple  . Vaginitis 09/13/2011    Past Surgical History:  Procedure Laterality Date  . DILATATION & CURRETTAGE/HYSTEROSCOPY WITH RESECTOCOPE N/A 11/06/2012   Procedure: DILATATION & CURETTAGE/HYSTEROSCOPY WITH RESECTOCOPE;  Surgeon: Alwyn Pea, MD;  Location: [facility];  Service: Gynecology;  Laterality: N/A;  . HERNIA REPAIR  1992  . small intestine hernia  1994  . WISDOM TOOTH EXTRACTION      There were no  vitals filed for this visit.   Subjective Assessment - 06/29/20 1010    Subjective Patient reports her back felt good after last session, but she went swimming later that day and felt cramping/pain down her Lt leg after swimming 27 laps. She also completed some yard work that bothered her back a little bit. She reports compliance with HEP and reports mild ache along middle of back currently.    Currently in Pain? Yes    Pain Score 4     Pain Location Back    Pain Orientation Lower    Pain Descriptors / Indicators Aching    Pain Onset More than a month ago              [facility] PT Assessment - 06/29/20 0001      Other:   Other/ Comments FOTO 65% function                         [facility] Adult PT Treatment/Exercise - 06/29/20 0001      Self-Care   Other Self-Care Comments  see patient education      Lumbar Exercises: Standing   Other Standing Lumbar Exercises body weight squat 5# KB      Lumbar Exercises: Supine   Single Leg Bridge 20 reps  Lumbar Exercises: Quadruped   Single Arm Raises Limitations 2 x 10      Manual Therapy   Soft tissue mobilization DTM to R lumbar paraspinals and Rt gluteal musculature                  PT Education - 06/29/20 0955    Education Details Education on FOTO score and overall progress since initiating PT    Person(s) Educated Patient    Methods Explanation            PT Short Term Goals - 06/14/20 1048      PT SHORT TERM GOAL #1   Title Pt will be I and compliant with initial HEP.    Time 2    Period Weeks    Status On-going    Target Date 06/21/20      PT SHORT TERM GOAL #2   Title FOTO will be taken by visit 3 with longterm goal created.    Baseline 43% on 06/14/20    Time 2    Period Weeks    Status Achieved             PT Long Term Goals - 06/14/20 1050      PT LONG TERM GOAL #1   Title Pt will demonstrate bilateral lumbar flexion to 20 degrees or fingertips just above patellae.    Time 8     Period Weeks    Status New    Target Date 08/02/20      PT LONG TERM GOAL #2   Title Pt will demonstate symmetrical thoracolumbar rotation in standing.    Baseline R 25% limitation, L 75% limitation    Time 8    Period Weeks    Status New    Target Date 08/02/20      PT LONG TERM GOAL #3   Title Pt will report decrease in pain level with transfers and sitting, demonstrating smoother transitions to upright posture.    Time 8    Period Weeks    Status New    Target Date 08/02/20      PT LONG TERM GOAL #4   Title Pt will return to prior levels of physical activity, including swimming or zumba, at least 3x/week.    Time 8    Period Weeks    Status New    Target Date 08/02/20      PT LONG TERM GOAL #5   Title Patient will score at least 55% function on the FOTO to signify clinically meaningful improvement in functional abilities.    Baseline 43%    Time 8    Period Weeks    Status New    Target Date 08/02/20                 Plan - 06/29/20 0957    Clinical Impression Statement Patient arrives with symmetrical pelvic alignment at beginning of session. Patient has significantly improved her FOTO score since beginning PT scoring 10 points above her predicted functional outcome. Able to progress hip and core strengthening with patient challenged in maintaining stability with quadruped activity.    Personal Factors and Comorbidities Age;Comorbidity 3+;Past/Current Experience;Time since onset of injury/illness/exacerbation    Comorbidities Arthritis, Osteoporosis, HLD, CKD    PT Treatment/Interventions ADLs/Self Care Home Management;Functional mobility training;Neuromuscular re-education;Spinal Manipulations;Joint Manipulations;Other (comment);Dry needling;Passive range of motion;Manual techniques;Patient/family education;Therapeutic activities;Therapeutic exercise;Balance training;Iontophoresis 4mg /ml Dexamethasone;Moist Heat;Cryotherapy;Electrical Stimulation    PT Next  Visit Plan Progress hip and core strength, consider  quadruped/stability ball exercises, RDL. Update HEP.    PT Home Exercise Plan [id number]: open books, standing QL stretch at doorway, QL mobilization with tennis ball at wall, LTR, figure 4 stretch, hip abduction, pelvic tilt    Consulted and Agree with Plan of Care Patient           Patient will benefit from skilled therapeutic intervention in order to improve the following deficits and impairments:  Postural dysfunction,Pain,Decreased range of motion,Impaired perceived functional ability,Improper body mechanics,Hypomobility  Visit Diagnosis: Arthritis of right sacroiliac joint  Sacroiliac dysfunction  Chronic right-sided low back pain without sciatica     Problem List Patient Active Problem List   Diagnosis Date Noted  . Cellulitis of finger of left hand 03/11/2020  . Dizziness 02/12/2020  . Vaginal discharge 10/06/2019  . RUQ discomfort 10/06/2019  . Female bladder prolapse 12/26/2018  . Degenerative lumbar spinal stenosis 08/19/2018  . 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 De Quervain's tenosynovitis, left 12/11/2017  . Arthritis of right sacroiliac joint 02/26/2017  . Nonallopathic lesion of lumbosacral region 01/31/2017  . Nonallopathic lesion of sacral region 01/03/2017  . Nonallopathic lesion of thoracic region 01/03/2017  . Cervical somatic dysfunction 12/07/2016  . Pelvic somatic dysfunction 12/07/2016  . SI (sacroiliac) joint dysfunction 12/07/2016  . Sciatica of right side 12/07/2016  . Acquired deformity of right toe 12/07/2016  . 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 Leg length discrepancy 01/25/2014  . History of iron deficiency anemia 12/02/2013  . Caregiver stress 12/02/2013  . Vitamin D deficiency 12/03/2012  . Fibrocystic breast 09/14/2011  . 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 GERD (gastroesophageal reflux disease)   . Osteoporosis    Gwendolyn Grant, PT, DPT, ATC 06/29/20 10:29 AM  [facility]  Health Outpatient Rehabilitation [facility] [address] [place], [place], [address] Phone: [phone]   Fax:  [phone]  Name: Kelsey [name] MRN: [id number] Date of Birth: May 18, 1946

## 2020-07-05 ENCOUNTER — Ambulatory Visit: Payer: Medicare PPO

## 2020-07-06 ENCOUNTER — Ambulatory Visit: Payer: Medicare PPO

## 2020-07-06 ENCOUNTER — Other Ambulatory Visit: Payer: Self-pay

## 2020-07-06 DIAGNOSIS — M47818 Spondylosis without myelopathy or radiculopathy, sacral and sacrococcygeal region: Secondary | ICD-10-CM

## 2020-07-06 DIAGNOSIS — M545 Low back pain, unspecified: Secondary | ICD-10-CM | POA: Diagnosis not present

## 2020-07-06 DIAGNOSIS — M533 Sacrococcygeal disorders, not elsewhere classified: Secondary | ICD-10-CM | POA: Diagnosis not present

## 2020-07-06 DIAGNOSIS — G8929 Other chronic pain: Secondary | ICD-10-CM | POA: Diagnosis not present

## 2020-07-06 NOTE — Therapy (Addendum)
[place], [place], [address] Phone: [phone]   Fax:  [phone]  Physical Therapy Treatment/Discharge   Patient Details  Name: Kelsey [name] MRN: [id number] Date of Birth: [birth year] Referring Provider (PT): [name], DO   Encounter Date: 07/06/2020   PT End of Session - 07/06/20 1605    Visit Number 7    Number of Visits 12    Date for PT Re-Evaluation 08/08/20    Authorization Type [employer]    Progress Note Due on Visit 10    PT Start Time 1548    PT Stop Time 1628    PT Time Calculation (min) 40 min    Activity Tolerance Patient tolerated treatment well    Behavior During Therapy [facility] for tasks assessed/performed           Past Medical History:  Diagnosis Date  . Allergy   . Anemia    iron deficiency  . 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 Bladder prolapse, female, acquired   . Chicken pox as a child  . Dysuria 09/13/2011  . Female bladder prolapse 09/13/2011  . Fibrocystic breast 09/14/2011  . GERD (gastroesophageal reflux disease)   . H/O measles   . H/O mumps   . Hyperlipidemia   . 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 Osteoarthrosis    right shoulder  . Osteoporosis    reclast in Oct  . Ovarian cyst 09/14/2011  . Renal cyst left   3 cm  . RMSF [facility] spotted fever) 2017  . Situational anxiety 03/19/2014   R/t grieving process & caregiver burden for husband who has dementia, alzheimer's.   Marland Kitchen Uterine fibroid 11/2012   See Dr. Cletis Media note, surgery 11/2012; calcified- multiple  . Vaginitis 09/13/2011    Past Surgical History:  Procedure Laterality Date  . DILATATION & CURRETTAGE/HYSTEROSCOPY WITH RESECTOCOPE N/A 11/06/2012   Procedure: DILATATION & CURETTAGE/HYSTEROSCOPY WITH RESECTOCOPE;  Surgeon: Alwyn Pea, MD;  Location: [facility];  Service: Gynecology;  Laterality: N/A;  . HERNIA REPAIR  1992  . small intestine hernia  1994  . WISDOM TOOTH EXTRACTION      There  were no vitals filed for this visit.   Subjective Assessment - 07/06/20 1550    Subjective patient states she hasn't had any time to do her exercises this week. She denies any pain in the back, but reports "discomfort" along the R side of her low back that doesn't keep her from doing her daily activity. Patient denies any pain/soreness after last session.    How long can you sit comfortably? I can sit as long as I want to, however getting up is harder    How long can you stand comfortably? I get tired, maybe half an hour    How long can you walk comfortably? I don't have pain with walking    Currently in Pain? No/denies              [facility] PT Assessment - 07/06/20 0001      Posture/Postural Control   Posture Comments pelvic symmetry                         [facility] Adult PT Treatment/Exercise - 07/06/20 0001      Self-Care   Other Self-Care Comments  see patient education      Lumbar Exercises: Standing   Other Standing Lumbar Exercises pallof press 2 x 10 red TB  Lumbar Exercises: Supine   Bridge Limitations SL bridge 2 x 10 each      Lumbar Exercises: Sidelying   Clam Limitations 2 x 10 blue TB      Lumbar Exercises: Quadruped   Straight Leg Raises Limitations 2 x 10      Manual Therapy   Soft tissue mobilization DTM to R lumbar paraspinals, posterior glutes, and piriformis                  PT Education - 07/06/20 1623    Education Details Education on updated HEP.    Person(s) Educated Patient    Methods Explanation;Demonstration;Verbal cues;Handout    Comprehension Verbalized understanding;Returned demonstration            PT Short Term Goals - 06/14/20 1048      PT SHORT TERM GOAL #1   Title Pt will be I and compliant with initial HEP.    Time 2    Period Weeks    Status On-going    Target Date 06/21/20      PT SHORT TERM GOAL #2   Title FOTO will be taken by visit 3 with longterm goal created.    Baseline 43% on 06/14/20     Time 2    Period Weeks    Status Achieved             PT Long Term Goals - 06/14/20 1050      PT LONG TERM GOAL #1   Title Pt will demonstrate bilateral lumbar flexion to 20 degrees or fingertips just above patellae.    Time 8    Period Weeks    Status New    Target Date 08/02/20      PT LONG TERM GOAL #2   Title Pt will demonstate symmetrical thoracolumbar rotation in standing.    Baseline R 25% limitation, L 75% limitation    Time 8    Period Weeks    Status New    Target Date 08/02/20      PT LONG TERM GOAL #3   Title Pt will report decrease in pain level with transfers and sitting, demonstrating smoother transitions to upright posture.    Time 8    Period Weeks    Status New    Target Date 08/02/20      PT LONG TERM GOAL #4   Title Pt will return to prior levels of physical activity, including swimming or zumba, at least 3x/week.    Time 8    Period Weeks    Status New    Target Date 08/02/20      PT LONG TERM GOAL #5   Title Patient will score at least 55% function on the FOTO to signify clinically meaningful improvement in functional abilities.    Baseline 43%    Time 8    Period Weeks    Status New    Target Date 08/02/20                 Plan - 07/06/20 1606    Clinical Impression Statement Patient arrives with symmetrical pelvic alignment at beginning of session. Patient has moderate tautness and palpable tenderness about glute med and piriformis with partial release from manual therapy. She will potentially benefit from dry needling at future sessions if tautness remains. Overall good tolerance to progression of hip and dynamic core stabilization without reports of pain. Patient challenged in maintaining pelvic stability with strengthening exercises in quadruped.    Personal  Factors and Comorbidities Age;Comorbidity 3+;Past/Current Experience;Time since onset of injury/illness/exacerbation    Comorbidities Arthritis, Osteoporosis, HLD, CKD    PT  Treatment/Interventions ADLs/Self Care Home Management;Functional mobility training;Neuromuscular re-education;Spinal Manipulations;Joint Manipulations;Other (comment);Dry needling;Passive range of motion;Manual techniques;Patient/family education;Therapeutic activities;Therapeutic exercise;Balance training;Iontophoresis 47m/ml Dexamethasone;Moist Heat;Cryotherapy;Electrical Stimulation    PT Next Visit Plan Consider dry needling, progress quadruped hip/core strengthening. Consider core stabilization on stability ball.    PT Home Exercise Plan [id number]: open books, standing QL stretch at doorway, QL mobilization with tennis ball at wall, LTR, figure 4 stretch, hip abduction, pelvic tilt    Consulted and Agree with Plan of Care Patient           Patient will benefit from skilled therapeutic intervention in order to improve the following deficits and impairments:  Postural dysfunction,Pain,Decreased range of motion,Impaired perceived functional ability,Improper body mechanics,Hypomobility  Visit Diagnosis: Arthritis of right sacroiliac joint  Sacroiliac dysfunction  Chronic right-sided low back pain without sciatica     Problem List Patient Active Problem List   Diagnosis Date Noted  . Cellulitis of finger of left hand 03/11/2020  . Dizziness 02/12/2020  . Vaginal discharge 10/06/2019  . RUQ discomfort 10/06/2019  . Female bladder prolapse 12/26/2018  . Degenerative lumbar spinal stenosis 08/19/2018  . CPetersarthritis 03/13/2018  . De Quervain's tenosynovitis, left 12/11/2017  . Arthritis of right sacroiliac joint 02/26/2017  . Nonallopathic lesion of lumbosacral region 01/31/2017  . Nonallopathic lesion of sacral region 01/03/2017  . Nonallopathic lesion of thoracic region 01/03/2017  . Cervical somatic dysfunction 12/07/2016  . Pelvic somatic dysfunction 12/07/2016  . SI (sacroiliac) joint dysfunction 12/07/2016  . Sciatica of right side 12/07/2016  . Acquired deformity of right  toe 12/07/2016  . 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 Leg length discrepancy 01/25/2014  . History of iron deficiency anemia 12/02/2013  . Caregiver stress 12/02/2013  . Vitamin D deficiency 12/03/2012  . Fibrocystic breast 09/14/2011  . 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 GERD (gastroesophageal reflux disease)   . Osteoporosis   PHYSICAL THERAPY DISCHARGE SUMMARY  Visits from Start of Care: 7  Current functional level related to goals / functional outcomes: See above    Remaining deficits: See above   Education / Equipment: See above   Plan: Patient agrees to discharge.  Patient goals were partially met. Patient is being discharged due to not returning since the last visit.  ?????        SGwendolyn Grant PT, DPT, ATC 07/06/20 4:30 PM   SGwendolyn Grant PT, DPT, ATC 08/15/20 10:08 AM  [facility] [place] [address]Phone: [phone]  Fax:  [phone] Name: TTHANA RAMPMRN: [id number] of Birth: 4Jun 02, 1947

## 2020-07-14 NOTE — Progress Notes (Unsigned)
[place] Phone: [phone] Subjective:   Kelsey [name], am serving as a scribe for Dr. [name]. This visit occurred during the SARS-CoV-2 public health emergency.  Safety protocols were in place, including screening questions prior to the visit, additional usage of staff PPE, and extensive cleaning of exam room while observing appropriate contact time as indicated for disinfecting solutions.   I'm seeing this patient by the request  of:  [name], [name], DO  CC: Low back pain follow-up  W[facility]:[id number]  Kelsey [name] is a 75 y.o. female coming in with complaint of back and neck pain. OMT 06/17/2020. Patient states that she continues to do physical therapy which helps. Feels like OMT and PT are maintenance vs cure. Has 5 sessions remaining and would like to do PT at home. Would like to do Tai Chi, yoga and continue OMT.   Medications patient has been prescribed: Vit D         Reviewed prior external information including notes and imaging from previsou exam, outside providers and external EMR if available.   As well as notes that were available from care everywhere and other healthcare systems.  Past medical history, social, surgical and family history all reviewed in electronic medical record.  [name] pertanent information unless stated regarding to the chief complaint.   Past Medical History:  Diagnosis Date  . Allergy   . Anemia    iron deficiency  . 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 Bladder prolapse, female, acquired   . Chicken pox as a child  . Dysuria 09/13/2011  . Female bladder prolapse 09/13/2011  . Fibrocystic breast 09/14/2011  . GERD (gastroesophageal reflux disease)   . H/O measles   . H/O mumps   . Hyperlipidemia   . 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 Osteoarthrosis    right shoulder  . Osteoporosis    reclast in Oct  . Ovarian cyst 09/14/2011  . Renal cyst left   3 cm  . RMSF [facility] spotted fever) 2017  . Situational anxiety 03/19/2014   R/t grieving process & caregiver burden for husband who has dementia, alzheimer's.   Marland Kitchen Uterine fibroid 11/2012   See Dr. Cletis Media note, surgery 11/2012; calcified- multiple  . Vaginitis 09/13/2011    Allergies  Allergen Reactions  . Penicillins Rash    Has patient had a PCN reaction causing immediate rash, facial/tongue/throat swelling, SOB or lightheadedness with hypotension: [name] Has patient had a PCN reaction causing severe rash involving mucus membranes or skin necrosis: yes Has patient had a PCN reaction that required hospitalization: [name] Has patient had a PCN reaction occurring within the last 10 years: [name] If all of the above answers are "[name]", then may proceed with Cephalosporin use.   . Sulfa Antibiotics Rash    Under arm and thighs     Review of Systems:  [name] headache, visual changes, nausea, vomiting, diarrhea, constipation, dizziness, abdominal pain, skin rash, fevers, chills, night sweats, weight loss, swollen lymph nodes, body aches, joint swelling, chest pain, shortness of breath, mood changes. POSITIVE muscle aches  Objective  Blood pressure 92/66, pulse 88, height '5\' 5"'  (1.651 m), weight 106 lb (48.1 kg), SpO2 95 %.   General: [name] apparent distress alert and oriented x3 mood and affect normal, dressed appropriately.  HEENT: Pupils equal, extraocular movements intact  Respiratory: Patient's speak in full sentences and does not appear short of breath  Cardiovascular: [name] lower extremity edema,  non tender, [name] erythema  Arthritic changes of multiple joints. Back exam has significant loss of lordosis.  Tenderness with palpation in the paraspinal musculature right greater than left.  Patient has very mild tightness with straight leg test but negative FABER test bilaterally.  Worsening pain with extension  Osteopathic findings   T9 extended rotated and side bent left L2 flexed rotated and side bent right Sacrum right  on right       Assessment and Plan: Degenerative lumbar spinal stenosis Significant arthritic changes.  Patient has done relatively well.  Has made some improvement with the physical therapy.  Patient will discontinue that and start with the home exercises.  Patient is the primary caregiver for her husband and we did discuss that she needs to prioritize some time for herself.  Patient will try to do this.  Patient does respond well to manipulation and follow-up with me again in 4 to 6 weeks     Nonallopathic problems  Decision today to treat with OMT was based on Physical Exam  After verbal consent patient was treated with  ME, FPR techniques in  thoracic, lumbar, and sacral  areas  Patient tolerated the procedure well with improvement in symptoms  Patient given exercises, stretches and lifestyle modifications  See medications in patient instructions if given  Patient will follow up in 4-8 weeks      The above documentation has been reviewed and is accurate and complete Lyndal Pulley, DO       Note: This dictation was prepared with Dragon dictation along with smaller phrase technology. Any transcriptional errors that result from this process are unintentional.

## 2020-07-15 ENCOUNTER — Other Ambulatory Visit: Payer: Self-pay

## 2020-07-15 ENCOUNTER — Ambulatory Visit (INDEPENDENT_AMBULATORY_CARE_PROVIDER_SITE_OTHER): Payer: Medicare PPO | Admitting: Family Medicine

## 2020-07-15 ENCOUNTER — Encounter: Payer: Self-pay | Admitting: Family Medicine

## 2020-07-15 VITALS — BP 92/66 | HR 88 | Ht 65.0 in | Wt 106.0 lb

## 2020-07-15 DIAGNOSIS — M999 Biomechanical lesion, unspecified: Secondary | ICD-10-CM

## 2020-07-15 DIAGNOSIS — M48061 Spinal stenosis, lumbar region without neurogenic claudication: Secondary | ICD-10-CM

## 2020-07-15 NOTE — Assessment & Plan Note (Signed)
Significant arthritic changes.  Patient has done relatively well.  Has made some improvement with the physical therapy.  Patient will discontinue that and start with the home exercises.  Patient is the primary caregiver for her husband and we did discuss that she needs to prioritize some time for herself.  Patient will try to do this.  Patient does respond well to manipulation and follow-up with me again in 4 to 6 weeks

## 2020-07-15 NOTE — Patient Instructions (Signed)
Ok for you to do exercises as home See me in 4-6 weeks

## 2020-07-18 ENCOUNTER — Other Ambulatory Visit: Payer: Medicare PPO

## 2020-07-18 DIAGNOSIS — Z20822 Contact with and (suspected) exposure to covid-19: Secondary | ICD-10-CM | POA: Diagnosis not present

## 2020-07-20 LAB — NOVEL CORONAVIRUS, NAA: SARS-CoV-2, NAA: DETECTED — AB

## 2020-07-20 LAB — SARS-COV-2, NAA 2 DAY TAT

## 2020-07-21 ENCOUNTER — Other Ambulatory Visit: Payer: Self-pay | Admitting: Oncology

## 2020-07-21 ENCOUNTER — Telehealth: Payer: Self-pay | Admitting: Family Medicine

## 2020-07-21 ENCOUNTER — Ambulatory Visit: Payer: Medicare PPO

## 2020-07-21 DIAGNOSIS — U071 COVID-19: Secondary | ICD-10-CM

## 2020-07-21 NOTE — Telephone Encounter (Signed)
Pt sched for VV

## 2020-07-21 NOTE — Telephone Encounter (Signed)
Pt tested positive for COVID, pt is wanting advice on what she should do to help with recovery. States [place] called her and offered her some type of treatment but she wants advice from her doctor first. Please call pt.

## 2020-07-21 NOTE — Progress Notes (Signed)
I connected by phone with Scherrie Gerlach on 07/21/2020 at 4:08 PM to discuss the potential use of a new treatment for mild to moderate COVID-19 viral infection in non-hospitalized patients.  This patient is a 75 y.o. female that meets the FDA criteria for Emergency Use Authorization of COVID monoclonal antibody sotrovimab.  Has a (+) direct SARS-CoV-2 viral test result  Has mild or moderate COVID-19   Is NOT hospitalized due to COVID-19  Is within 10 days of symptom onset  Has at least one of the high risk factor(s) for progression to severe COVID-19 and/or hospitalization as defined in EUA.  Specific high risk criteria : Older age (>/= 75 yo), Immunosuppressive Disease or Treatment and Cardiovascular disease or hypertension   I have spoken and communicated the following to the patient or parent/caregiver regarding COVID monoclonal antibody treatment:  1. FDA has authorized the emergency use for the treatment of mild to moderate COVID-19 in adults and pediatric patients with positive results of direct SARS-CoV-2 viral testing who are 34 years of age and older weighing at least 40 kg, and who are at high risk for progressing to severe COVID-19 and/or hospitalization.  2. The significant known and potential risks and benefits of COVID monoclonal antibody, and the extent to which such potential risks and benefits are unknown.  3. Information on available alternative treatments and the risks and benefits of those alternatives, including clinical trials.  4. Patients treated with COVID monoclonal antibody should continue to self-isolate and use infection control measures (e.g., wear mask, isolate, social distance, avoid sharing personal items, clean and disinfect "high touch" surfaces, and frequent handwashing) according to CDC guidelines.   5. The patient or parent/caregiver has the option to accept or refuse COVID monoclonal antibody treatment.  After reviewing this information with the  patient, the patient has agreed to receive one of the available covid 19 monoclonal antibodies and will be provided an appropriate fact sheet prior to infusion. Jacquelin Hawking, NP 07/21/2020 4:08 PM

## 2020-07-22 ENCOUNTER — Ambulatory Visit (HOSPITAL_COMMUNITY)
Admission: RE | Admit: 2020-07-22 | Discharge: 2020-07-22 | Disposition: A | Discharge status: Home / Self Care | Payer: Medicare PPO | Source: Ambulatory Visit | Attending: Pulmonary Disease | Admitting: Pulmonary Disease

## 2020-07-22 ENCOUNTER — Telehealth (INDEPENDENT_AMBULATORY_CARE_PROVIDER_SITE_OTHER): Payer: Medicare PPO | Admitting: Family Medicine

## 2020-07-22 ENCOUNTER — Encounter: Payer: Self-pay | Admitting: Family Medicine

## 2020-07-22 VITALS — Temp 99.5°F

## 2020-07-22 DIAGNOSIS — R142 Eructation: Secondary | ICD-10-CM | POA: Insufficient documentation

## 2020-07-22 DIAGNOSIS — R634 Abnormal weight loss: Secondary | ICD-10-CM | POA: Insufficient documentation

## 2020-07-22 DIAGNOSIS — D849 Immunodeficiency, unspecified: Secondary | ICD-10-CM | POA: Insufficient documentation

## 2020-07-22 DIAGNOSIS — R141 Gas pain: Secondary | ICD-10-CM | POA: Insufficient documentation

## 2020-07-22 DIAGNOSIS — R54 Age-related physical debility: Secondary | ICD-10-CM | POA: Insufficient documentation

## 2020-07-22 DIAGNOSIS — I10 Essential (primary) hypertension: Secondary | ICD-10-CM | POA: Diagnosis not present

## 2020-07-22 DIAGNOSIS — R059 Cough, unspecified: Secondary | ICD-10-CM

## 2020-07-22 DIAGNOSIS — R1013 Epigastric pain: Secondary | ICD-10-CM | POA: Insufficient documentation

## 2020-07-22 DIAGNOSIS — U071 COVID-19: Secondary | ICD-10-CM | POA: Insufficient documentation

## 2020-07-22 MED ORDER — METHYLPREDNISOLONE SODIUM SUCC 125 MG IJ SOLR: 125.0000 mg | Freq: Once | INTRAMUSCULAR | Status: DC | PRN

## 2020-07-22 MED ORDER — SODIUM CHLORIDE 0.9 % IV SOLN: INTRAVENOUS | Status: DC | PRN

## 2020-07-22 MED ORDER — PREDNISONE 20 MG PO TABS
ORAL_TABLET | ORAL | 0 refills | Status: DC
Start: 2020-07-22 — End: 2020-08-19

## 2020-07-22 MED ORDER — EPINEPHRINE 0.3 MG/0.3ML IJ SOAJ: 0.3000 mg | Freq: Once | INTRAMUSCULAR | Status: DC | PRN

## 2020-07-22 MED ORDER — DIPHENHYDRAMINE HCL 50 MG/ML IJ SOLN: 50.0000 mg | Freq: Once | INTRAMUSCULAR | Status: DC | PRN

## 2020-07-22 MED ORDER — FAMOTIDINE IN NACL 20-0.9 MG/50ML-% IV SOLN: 20.0000 mg | Freq: Once | INTRAVENOUS | Status: DC | PRN

## 2020-07-22 MED ORDER — SOTROVIMAB 500 MG/8ML IV SOLN
500.0000 mg | Freq: Once | INTRAVENOUS | Status: AC
  Administered 2020-07-22: 500 mg via INTRAVENOUS

## 2020-07-22 MED ORDER — BENZONATATE 200 MG PO CAPS: 200.0000 mg | ORAL_CAPSULE | Freq: Two times a day (BID) | ORAL | 0 refills | Status: DC | PRN

## 2020-07-22 MED ORDER — SODIUM CHLORIDE 0.9 % IV BOLUS
1000.0000 mL | Freq: Once | INTRAVENOUS | Status: AC
  Administered 2020-07-22: 1000 mL via INTRAVENOUS

## 2020-07-22 MED ORDER — AZITHROMYCIN 250 MG PO TABS
ORAL_TABLET | ORAL | 0 refills | Status: DC
Start: 2020-07-22 — End: 2020-08-19

## 2020-07-22 MED ORDER — ALBUTEROL SULFATE HFA 108 (90 BASE) MCG/ACT IN AERS: 2.0000 | INHALATION_SPRAY | Freq: Once | RESPIRATORY_TRACT | Status: DC | PRN

## 2020-07-22 NOTE — Patient Instructions (Signed)

## 2020-07-22 NOTE — Discharge Instructions (Signed)

## 2020-07-22 NOTE — Progress Notes (Signed)
Patient reviewed Fact Sheet for Patients, Parents, and Caregivers for Emergency Use Authorization (EUA) of sotrovimab for the Treatment of Coronavirus. Patient also reviewed and is agreeable to the estimated cost of treatment. Patient is agreeable to proceed.   

## 2020-07-22 NOTE — Progress Notes (Signed)
VIRTUAL VISIT VIA telephone - she was unable to get her video to work.   I connected with Kelsey [name] on 07/22/20 at  8:30 AM EST by elemedicine application and verified that I am speaking with the correct person using two identifiers. Location patient: Home Location provider: [facility], Office Persons participating in the virtual visit: Patient, Dr. [name] and Samul Dada, CMA  I discussed the limitations of evaluation and management by telemedicine and the availability of in person appointments. The patient expressed understanding and agreed to proceed.   SUBJECTIVE Chief Complaint  Patient presents with  . Covid Positive    Pt c/o post nasal drip, runny nose, loss of smell, sore throat, productive cough x 10 days    HPI: Kelsey [name] is a 75 y.o. female present for covid + illness that started 07/12/2020. Positive test 07/18/2020. Pt endorses nasal drip, runny nose, loss of smell, sore throat and productive cough that is worsening.  She denies nausea, vomit, fever, chills. She is tolerating PO Exposure: Husbands adult daycare. covid series completed in 08/2019.She has not gotten her booster.    ROS: See pertinent positives and negatives per HPI.  Patient Active Problem List   Diagnosis Date Noted  . Female bladder prolapse 12/26/2018    Priority: High  . History of iron deficiency anemia 12/02/2013    Priority: High  . Vitamin D deficiency 12/03/2012    Priority: High  . GERD (gastroesophageal reflux disease)     Priority: High  . Osteoporosis     Priority: High  . Epigastric pain 07/22/2020  . Flatulence, eructation and gas pain 07/22/2020  . Abnormal weight loss 07/22/2020  . Cellulitis of finger of left hand 03/11/2020  . Dizziness 02/12/2020  . Vaginal discharge 10/06/2019  . RUQ discomfort 10/06/2019  . Degenerative lumbar spinal stenosis 08/19/2018  . [place] arthritis 03/13/2018  . De Quervain's tenosynovitis, left 12/11/2017  . Arthritis of  right sacroiliac joint 02/26/2017  . Nonallopathic lesion of lumbosacral region 01/31/2017  . Nonallopathic lesion of sacral region 01/03/2017  . Nonallopathic lesion of thoracic region 01/03/2017  . Cervical somatic dysfunction 12/07/2016  . Pelvic somatic dysfunction 12/07/2016  . SI (sacroiliac) joint dysfunction 12/07/2016  . Sciatica of right side 12/07/2016  . Acquired deformity of right toe 12/07/2016  . 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 Leg length discrepancy 01/25/2014  . Caregiver stress 12/02/2013  . Fibrocystic breast 09/14/2011  . 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    Social History   Tobacco Use  . Smoking status: Former Smoker    Packs/day: 0.30    Years: 10.00    Pack years: 3.00    Types: Cigarettes    Quit date: 07/09/1978    Years since quitting: 42.0  . Smokeless tobacco: Never Used  Substance Use Topics  . Alcohol use: Yes    Comment: 1-2 glasses of wine daily and a 12 oz beer daily    Current Outpatient Medications:  .  azithromycin (ZITHROMAX) 250 MG tablet, 500 mg day 1, then 250 mg x4 days., Disp: 6 tablet, Rfl: 0 .  benzonatate (TESSALON) 200 MG capsule, Take 1 capsule (200 mg total) by mouth 2 (two) times daily as needed for cough., Disp: 20 capsule, Rfl: 0 .  famotidine (PEPCID) 20 MG tablet, TAKE 1 TABLET(20 MG) BY MOUTH TWICE DAILY, Disp: 60 tablet, Rfl: 5 .  omeprazole (PRILOSEC) 20 MG capsule, omeprazole 20 mg capsule,delayed release, Disp: , Rfl:  .  omeprazole (PRILOSEC)  40 MG capsule, TAKE 1 CAPSULE(40 MG) BY MOUTH IN THE MORNING AND AT BEDTIME, Disp: 60 capsule, Rfl: 5 .  predniSONE (DELTASONE) 20 MG tablet, 60 mg x3d, 40 mg x3d, 20 mg x2d, 10 mg x2d, Disp: 18 tablet, Rfl: 0 .  sucralfate (CARAFATE) 1 g tablet, TAKE 1 TABLET(1 GRAM) BY MOUTH FOUR TIMES DAILY AT BEDTIME WITH MEALS, Disp: 120 tablet, Rfl: 5 .  atorvastatin (LIPITOR) 10 MG tablet, Take 1 tablet (10 mg total) by mouth daily. (Patient not taking: Reported on 07/22/2020),  Disp: 90 tablet, Rfl: 3 .  Vitamin D, Ergocalciferol, (DRISDOL) 1.25 MG (50000 UNIT) CAPS capsule, Take 1 capsule (50,000 Units total) by mouth every 7 (seven) days. (Patient not taking: Reported on 07/22/2020), Disp: 12 capsule, Rfl: 0  Allergies  Allergen Reactions  . Penicillins Rash    Has patient had a PCN reaction causing immediate rash, facial/tongue/throat swelling, SOB or lightheadedness with hypotension: no Has patient had a PCN reaction causing severe rash involving mucus membranes or skin necrosis: yes Has patient had a PCN reaction that required hospitalization: no Has patient had a PCN reaction occurring within the last 10 years: no If all of the above answers are "NO", then may proceed with Cephalosporin use.   . Sulfa Antibiotics Rash    Under arm and thighs    OBJECTIVE: Temp 99.5 F (37.5 C) (Temporal)  Gen: No acute distress. Nontoxic in appearance.  HENT: AT. .  MMM.  Eyes:Pupils Equal Round Reactive to light, Extraocular movements intact,  Conjunctiva without redness, discharge or icterus. Chest: Cough present  Skin: no rashes, purpura or petechiae.  Neuro:Alert. Oriented x3  Psych: Normal affect and demeanor. Normal speech. Normal thought content and judgment.  ASSESSMENT AND PLAN: Kelsey [name] is a 75 y.o. female present for  COVID-19/Cough Rest, hydrate.  mucinex (DM if cough), nettie pot or nasal saline.  azith and prednsioneprescribed, take until completed.  She is schedule for M-ab tx today with her husband  - self isolate 10 days from onset of symptoms.  F/U 2 weeks if not improved, sooner if worsening     25 minutes spent with patient today  Howard Pouch, DO 07/22/2020   No follow-ups on file.  No orders of the defined types were placed in this encounter.  Meds ordered this encounter  Medications  . azithromycin (ZITHROMAX) 250 MG tablet    Sig: 500 mg day 1, then 250 mg x4 days.    Dispense:  6 tablet    Refill:  0  .  predniSONE (DELTASONE) 20 MG tablet    Sig: 60 mg x3d, 40 mg x3d, 20 mg x2d, 10 mg x2d    Dispense:  18 tablet    Refill:  0  . benzonatate (TESSALON) 200 MG capsule    Sig: Take 1 capsule (200 mg total) by mouth 2 (two) times daily as needed for cough.    Dispense:  20 capsule    Refill:  0   Referral Orders  No referral(s) requested today

## 2020-07-22 NOTE — Progress Notes (Signed)
Diagnosis: COVID-19  Physician: Dr. Patrick Wright  Procedure: [facility] Med: Sotrovimab infusion - Provided patient with sotrovimab fact sheet for patients, parents, and caregivers prior to infusion.   Complications: No immediate complications noted  Discharge: Discharged home    

## 2020-08-02 ENCOUNTER — Ambulatory Visit: Payer: Medicare PPO | Attending: Family Medicine

## 2020-08-02 ENCOUNTER — Telehealth: Payer: Self-pay

## 2020-08-02 NOTE — Telephone Encounter (Signed)
Left voicemail notifying patient of no-show appointment. Was instructed to call back to let us know if she would like to schedule more appointments or be discharged.

## 2020-08-12 ENCOUNTER — Other Ambulatory Visit: Payer: Self-pay

## 2020-08-12 ENCOUNTER — Encounter: Payer: Self-pay | Admitting: Family Medicine

## 2020-08-12 ENCOUNTER — Ambulatory Visit (INDEPENDENT_AMBULATORY_CARE_PROVIDER_SITE_OTHER): Payer: Medicare PPO | Admitting: Family Medicine

## 2020-08-12 VITALS — BP 88/70 | HR 94 | Ht 65.0 in | Wt 107.0 lb

## 2020-08-12 DIAGNOSIS — M48061 Spinal stenosis, lumbar region without neurogenic claudication: Secondary | ICD-10-CM | POA: Diagnosis not present

## 2020-08-12 DIAGNOSIS — M999 Biomechanical lesion, unspecified: Secondary | ICD-10-CM | POA: Diagnosis not present

## 2020-08-12 NOTE — Assessment & Plan Note (Signed)
Severe overall. Discussed with patient again at great length. Patient would like to avoid a lot of different medications. Patient does seem a little more limber after being in the pool. Patient encouraged to try to do the physical therapy again as a possibility because patient has responded well. Increase activity slowly. Follow-up again 6 to 8 weeks

## 2020-08-12 NOTE — Progress Notes (Signed)
[facility] [place] Phone: [phone] Subjective:   [name], am serving as a scribe for Dr. [name]. This visit occurred during the SARS-CoV-2 public health emergency.  Safety protocols were in place, including screening questions prior to the visit, additional usage of staff PPE, and extensive cleaning of exam room while observing appropriate contact time as indicated for disinfecting solutions.   I'm seeing this patient by the request  of:  [name], [name], DO  CC: Low back pain follow-up  FHL:[id number]  Sharetha KRINA MRAZ is a 75 y.o. female coming in with complaint of back and neck pain.OMT 07/15/2020. Patient states that she just came from the pool. She is moving much easier today. Was snowed in and did not go to PT and feels a difference from when she was going vs not going. Has not been as compliant with exercises at home. Has tried to do yoga, tai chi.           Reviewed prior external information including notes and imaging from previsou exam, outside providers and external EMR if available.   As well as notes that were available from care everywhere and other healthcare systems.  Past medical history, social, surgical and family history all reviewed in electronic medical record.  No pertanent information unless stated regarding to the chief complaint.   Past Medical History:  Diagnosis Date  . Allergy   . Anemia    iron deficiency  . 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 Bladder prolapse, female, acquired   . Chicken pox as a child  . Dysuria 09/13/2011  . Female bladder prolapse 09/13/2011  . Fibrocystic breast 09/14/2011  . GERD (gastroesophageal reflux disease)   . H/O measles   . H/O mumps   . Hyperlipidemia   . 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 Osteoarthrosis    right shoulder  . Osteoporosis    reclast in Oct  . Ovarian cyst 09/14/2011  . Renal cyst left   3 cm  . [facility] spotted  fever) 2017  . Situational anxiety 03/19/2014   R/t grieving process & caregiver burden for husband who has dementia, alzheimer's.   Marland Kitchen Uterine fibroid 11/2012   See Dr. Cletis Media note, surgery 11/2012; calcified- multiple  . Vaginitis 09/13/2011    Allergies  Allergen Reactions  . Penicillins Rash    Has patient had a PCN reaction causing immediate rash, facial/tongue/throat swelling, SOB or lightheadedness with hypotension: no Has patient had a PCN reaction causing severe rash involving mucus membranes or skin necrosis: yes Has patient had a PCN reaction that required hospitalization: no Has patient had a PCN reaction occurring within the last 10 years: no If all of the above answers are "NO", then may proceed with Cephalosporin use.   . Sulfa Antibiotics Rash    Under arm and thighs     Review of Systems:  No headache, visual changes, nausea, vomiting, diarrhea, constipation, dizziness, abdominal pain, skin rash, fevers, chills, night sweats, weight loss, swollen lymph nodes, body aches, joint swelling, chest pain, shortness of breath, mood changes. POSITIVE muscle aches  Objective  Blood pressure (!) 88/70, pulse 94, height '5\' 5"'  (1.651 m), weight 107 lb (48.5 kg), SpO2 96 %.   General: No apparent distress alert and oriented x3 mood and affect normal, dressed appropriately.  HEENT: Pupils equal, extraocular movements intact  Respiratory: Patient's speak in full sentences and does not appear short of breath  Cardiovascular: No  lower extremity edema, non tender, no erythema  Gait normal with good balance and coordination.  MSK: Arthritic changes of multiple joints Back -low back pain. Loss of lordosis. Patient does have some degenerative scoliosis noted. Some mild improvement on range of motion from patient's baseline actually. Negative straight leg test. Negative FABER test. Still limited sidebending and rotation of the lower back.  Osteopathic findings  T9 extended rotated and side  bent left L2 flexed rotated and side bent right L5 flexed rotated and side bent left Sacrum right on right       Assessment and Plan:  Degenerative lumbar spinal stenosis Severe overall. Discussed with patient again at great length. Patient would like to avoid a lot of different medications. Patient does seem a little more limber after being in the pool. Patient encouraged to try to do the physical therapy again as a possibility because patient has responded well. Increase activity slowly. Follow-up again 6 to 8 weeks    Nonallopathic problems  Decision today to treat with OMT was based on Physical Exam  After verbal consent patient was treated with  ME, FPR techniques in  thoracic, lumbar, and sacral  areas  Patient tolerated the procedure well with improvement in symptoms  Patient given exercises, stretches and lifestyle modifications  See medications in patient instructions if given  Patient will follow up in 4-8 weeks      The above documentation has been reviewed and is accurate and complete Lyndal Pulley, DO       Note: This dictation was prepared with Dragon dictation along with smaller phrase technology. Any transcriptional errors that result from this process are unintentional.

## 2020-08-12 NOTE — Patient Instructions (Signed)
Keep swimming!

## 2020-08-15 ENCOUNTER — Ambulatory Visit: Payer: Medicare PPO | Admitting: Family Medicine

## 2020-08-17 ENCOUNTER — Ambulatory Visit (INDEPENDENT_AMBULATORY_CARE_PROVIDER_SITE_OTHER): Payer: Medicare PPO

## 2020-08-17 VITALS — Ht 65.0 in | Wt 107.0 lb

## 2020-08-17 DIAGNOSIS — Z0000 Encounter for general adult medical examination without abnormal findings: Secondary | ICD-10-CM

## 2020-08-17 DIAGNOSIS — Z780 Asymptomatic menopausal state: Secondary | ICD-10-CM | POA: Diagnosis not present

## 2020-08-17 NOTE — Progress Notes (Signed)
Subjective:   Kelsey [name] is a 75 y.o. female who presents for Medicare Annual (Subsequent) preventive examination.  I connected with Jamaris today by telephone and verified that I am speaking with the correct person using two identifiers. Location patient: home Location provider: work Persons participating in the virtual visit: patient, Marine scientist.    I discussed the limitations, risks, security and privacy concerns of performing an evaluation and management service by telephone and the availability of in person appointments. I also discussed with the patient that there may be a patient responsible charge related to this service. The patient expressed understanding and verbally consented to this telephonic visit.    Interactive audio and video telecommunications were attempted between this provider and patient, however failed, due to patient having technical difficulties OR patient did not have access to video capability.  We continued and completed visit with audio only.  Some vital signs may be absent or patient reported.   Time Spent with patient on telephone encounter: 20 minutes   Review of Systems     Cardiac Risk Factors include: advanced age (>6mn, >>21women)     Objective:    Today's Vitals   08/17/20 0944  Weight: 107 lb (48.5 kg)  Height: _0  (1.651 m)   Body mass index is 17.81 kg/m.  Advanced Directives 08/17/2020 06/07/2020 04/07/2018 12/18/2016 12/17/2016 08/05/2016 11/04/2012  Does Patient Have a Medical Advance Directive? _1  No Patient has advance directive, copy not in chart  Type of Advance Directive [place] will Living will [place] will [place]ieder[place]Living will [place]idronLiving will - [place]in Chart? Yes - validated most recent copy scanned in chart (See row information) - Yes Yes No - copy  requested - -    Current Medications (verified) Outpatient Encounter Medications as of 08/17/2020  Medication Sig  . azithromycin (ZITHROMAX) 250 MG tablet 500 mg day 1, then 250 mg x4 days.  . benzonatate (TESSALON) 200 MG capsule Take 1 capsule (200 mg total) by mouth 2 (two) times daily as needed for cough.  . famotidine (PEPCID) 20 MG tablet TAKE 1 TABLET(20 MG) BY MOUTH TWICE DAILY  . omeprazole (PRILOSEC) 20 MG capsule omeprazole 20 mg capsule,delayed release  . omeprazole (PRILOSEC) 40 MG capsule TAKE 1 CAPSULE(40 MG) BY MOUTH IN THE MORNING AND AT BEDTIME  . predniSONE (DELTASONE) 20 MG tablet 60 mg x3d, 40 mg x3d, 20 mg x2d, 10 mg x2d  . sucralfate (CARAFATE) 1 g tablet TAKE 1 TABLET(1 GRAM) BY MOUTH FOUR TIMES DAILY AT BEDTIME WITH MEALS  . Vitamin D, Ergocalciferol, (DRISDOL) 1.25 MG (50000 UNIT) CAPS capsule Take 1 capsule (50,000 Units total) by mouth every 7 (seven) days.  .Marland Kitchenatorvastatin (LIPITOR) 10 MG tablet Take 1 tablet (10 mg total) by mouth daily. (Patient not taking: No sig reported)   No facility-administered encounter medications on file as of 08/17/2020.    Allergies (verified) Penicillins and Sulfa antibiotics   History: Past Medical History:  Diagnosis Date  . Allergy   . Anemia    iron deficiency  . 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 Bladder prolapse, female, acquired   . Chicken pox as a child  . Dysuria 09/13/2011  . Female bladder prolapse 09/13/2011  . Fibrocystic breast 09/14/2011  . GERD (gastroesophageal reflux disease)   . H/O measles   . H/O mumps   .  Hyperlipidemia   . 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 Osteoarthrosis    right shoulder  . Osteoporosis    reclast in Oct  . Ovarian cyst 09/14/2011  . Renal cyst left   3 cm  . [facility] spotted fever) 2017  . Situational anxiety 03/19/2014   R/t grieving process & caregiver burden for husband who has dementia, alzheimer's.   Marland Kitchen Uterine fibroid 11/2012   See Dr. Cletis Media note, surgery 11/2012;  calcified- multiple  . Vaginitis 09/13/2011   Past Surgical History:  Procedure Laterality Date  . DILATATION & CURRETTAGE/HYSTEROSCOPY WITH RESECTOCOPE N/A 11/06/2012   Procedure: DILATATION & CURETTAGE/HYSTEROSCOPY WITH RESECTOCOPE;  Surgeon: Alwyn Pea, MD;  Location: [facility];  Service: Gynecology;  Laterality: N/A;  . HERNIA REPAIR  1992  . small intestine hernia  1994  . WISDOM TOOTH EXTRACTION     Family History  Problem Relation Age of Onset  . Heart disease Mother   . Osteoporosis Mother   . Cancer Father        prostate  . Osteoporosis Sister   . Gout Brother   . Gout Maternal Grandmother   . Heart disease Maternal Grandmother   . Alzheimer's disease Maternal Grandfather   . Osteoporosis Sister    Social History   Socioeconomic History  . Marital status: Married    Spouse name: Not on file  . Number of children: Not on file  . Years of education: Not on file  . Highest education level: Not on file  Occupational History  . Not on file  Tobacco Use  . Smoking status: Former Smoker    Packs/day: 0.30    Years: 10.00    Pack years: 3.00    Types: Cigarettes    Quit date: 07/09/1978    Years since quitting: 42.1  . Smokeless tobacco: Never Used  Vaping Use  . Vaping Use: Never used  Substance and Sexual Activity  . Alcohol use: Yes    Comment: 1-2 glasses of wine daily and a 12 oz beer daily  . Drug use: No  . Sexual activity: Never  Other Topics Concern  . Not on file  Social History Narrative  . Not on file   Social Determinants of Health   Financial Resource Strain: Not on file  Food Insecurity: Not on file  Transportation Needs: No Transportation Needs  . Lack of Transportation (Medical): No  . Lack of Transportation (Non-Medical): No  Physical Activity: Sufficiently Active  . Days of Exercise per Week: 4 days  . Minutes of Exercise per Session: 60 min  Stress: Not on file  Social Connections: Not on file    Tobacco Counseling Counseling  given: Not Answered   Clinical Intake:  Pre-visit preparation completed: Yes  Pain : No/denies pain     Nutritional Status: BMI <19  Underweight Nutritional Risks: None Diabetes: No  How often do you need to have someone help you when you read instructions, pamphlets, or other written materials from your doctor or pharmacy?: 1 - Never  Diabetic?No  Interpreter Needed?: No  Information entered by :: Caroleen Hamman LPN   Activities of Daily Living In your present state of health, do you have any difficulty performing the following activities: 08/17/2020  Hearing? N  Vision? N  Difficulty concentrating or making decisions? Y  Comment forgets names & where she places items sometimes  Walking or climbing stairs? N  Dressing or bathing? N  Doing errands, shopping? N  Conservation officer, nature and  eating ? N  Using the Toilet? N  In the past six months, have you accidently leaked urine? N  Do you have problems with loss of bowel control? N  Managing your Medications? N  Managing your Finances? N  Housekeeping or managing your Housekeeping? N  Some recent data might be hidden    Patient Care Team: [name], DO as PCP - General (Family Medicine) [name], MD as Consulting Physician (Gastroenterology) Calvert Cantor, MD as Consulting Physician (Ophthalmology) Lyndal Pulley, DO as Consulting Physician (Family Medicine)  Indicate any recent Medical Services you may have received from other than [facility] providers in the past year (date may be approximate).     Assessment:   This is a routine wellness examination for Mahli.  Hearing/Vision screen  Hearing Screening   _0  _1  _2  _3  _4  _5  _6  _7  _8   Right ear:           Left ear:           Comments: No issues  Vision Screening Comments: Wears glasses Last eye exam-03/2020-Dr. Jerline Pain  Dietary issues and exercise activities discussed: Current Exercise Habits: Home exercise routine, Type of  exercise: yoga;Other - see comments;walking (swimming, tai chi), Time (Minutes): 60, Frequency (Times/Week): 3, Weekly Exercise (Minutes/Week): 180, Intensity: Mild, Exercise limited by: None identified  Goals    . Gain weight     Gain weight by increasing protein     . Patient Stated     Continue doing Tai Chi      Depression Screen PHQ 2/9 Scores 08/17/2020 03/02/2020 12/26/2018 04/07/2018 03/05/2018 11/06/2017 12/17/2016  PHQ - 2 Score 0 1 0 0 0 0 0    Fall Risk Fall Risk  08/17/2020 03/02/2020 12/26/2018 04/07/2018 03/05/2018  Falls in the past year? 0 1 0 No No  Comment - - - - -  Number falls in past yr: 0 0 0 - -  Injury with Fall? 0 1 0 - -  Comment - - - - -  Risk for fall due to : - - - - -  Risk for fall due to: Comment - - - - -  Follow up Falls prevention discussed Falls evaluation completed Falls evaluation completed - -    FALL RISK PREVENTION PERTAINING TO THE HOME:  Any stairs in or around the home? Yes  If so, are there any without handrails? No  Home free of loose throw rugs in walkways, pet beds, electrical cords, etc? Yes  Adequate lighting in your home to reduce risk of falls? Yes   ASSISTIVE DEVICES UTILIZED TO PREVENT FALLS:  Life alert? No  Use of a cane, walker or w/c? No  Grab bars in the bathroom? Yes  Shower chair or bench in shower? No  Elevated toilet seat or a handicapped toilet? No   TIMED UP AND GO:  Was the test performed? No . Phone visit   Cognitive Function:Patient is concerned about her memory. She has a family history of Dementia. Appointment scheduled with PCP to discuss concerns. No cognitive impairment noted during visit today. MMSE - Mini Mental State Exam 04/07/2018  Orientation to time 5  Orientation to Place 5  Registration 3  Attention/ Calculation 5  Recall 3  Language- name 2 objects 2  Language- repeat 1  Language- follow 3 step command 3  Language- read & follow direction 1  Write a sentence 1  Copy design 1  Total score  30     6CIT  Screen 08/17/2020  What Year? 0 points  What month? 0 points  What time? 0 points  Count back from 20 0 points  Months in reverse 0 points  Repeat phrase 0 points  Total Score 0    Immunizations Immunization History  Administered Date(s) Administered  . Fluad Quad(high Dose 65+) 04/14/2019  . Hepatitis A, Adult 03/23/2014  . Influenza Whole 04/09/2011  . Influenza, High Dose Seasonal PF 05/27/2015, 04/12/2016, 04/02/2017, 04/07/2018  . Influenza,inj,Quad PF,6+ Mos 03/19/2014  . PFIZER(Purple Top)SARS-COV-2 Vaccination 07/20/2019, 08/19/2019  . Pneumococcal Conjugate-13 12/02/2013, 04/02/2014  . Pneumococcal Polysaccharide-23 12/08/2015  . Tdap 12/02/2013    TDAP status: Up to date  Flu Vaccine status: Due, Education has been provided regarding the importance of this vaccine. Advised may receive this vaccine at local pharmacy or Health Dept. Aware to provide a copy of the vaccination record if obtained from local pharmacy or Health Dept. Verbalized acceptance and understanding.  Pneumococcal vaccine status: Up to date  Covid-19 vaccine status: Completed vaccines First 2 doses completed. Patient had Covid in January  Qualifies for Shingles Vaccine? Yes   Zostavax completed No   Shingrix Completed?: No.    Education has been provided regarding the importance of this vaccine. Patient has been advised to call insurance company to determine out of pocket expense if they have not yet received this vaccine. Advised may also receive vaccine at local pharmacy or Health Dept. Verbalized acceptance and understanding.  Screening Tests Health Maintenance  Topic Date Due  . DEXA SCAN  12/06/2019  . INFLUENZA VACCINE  02/07/2020  . COVID-19 Vaccine (3 - Booster for Pfizer series) 02/16/2020  . MAMMOGRAM  05/31/2021  . TETANUS/TDAP  12/03/2023  . COLONOSCOPY (Pts 45-36yr Insurance coverage will need to be confirmed)  01/07/2024  . Hepatitis C Screening  Completed  . PNA vac  Low Risk Adult  Completed    Health Maintenance  Health Maintenance Due  Topic Date Due  . DEXA SCAN  12/06/2019  . INFLUENZA VACCINE  02/07/2020  . COVID-19 Vaccine (3 - Booster for Pfizer series) 02/16/2020    Colorectal cancer screening: Type of screening: Colonoscopy. Completed 01/06/2014. Repeat every 10 years  Mammogram status: Completed Bilateral 05/31/2020. Repeat every year  Bone Density status: Ordered today. Pt provided with contact info and advised to call to schedule appt.  Lung Cancer Screening: (Low Dose CT Chest recommended if Age 75-80years, 30 pack-year currently smoking OR have quit w/in 15years.) does not qualify.    Additional Screening:  Hepatitis C Screening: Completed 12/02/2013  Vision Screening: Recommended annual ophthalmology exams for early detection of glaucoma and other disorders of the eye. Is the patient up to date with their annual eye exam?  Yes  Who is the provider or what is the name of the office in which the patient attends annual eye exams? Dr. PJerline Pain  Dental Screening: Recommended annual dental exams for proper oral hygiene  Community Resource Referral / Chronic Care Management: CRR required this visit?  No   CCM required this visit?  No      Plan:     I have personally reviewed and noted the following in the patient's chart:   . Medical and social history . Use of alcohol, tobacco or illicit drugs  . Current medications and supplements . Functional ability and status . Nutritional status . Physical activity . Advanced directives . List of other physicians . Hospitalizations, surgeries, and ER visits in previous 12 months . Vitals .  Screenings to include cognitive, depression, and falls . Referrals and appointments  In addition, I have reviewed and discussed with patient certain preventive protocols, quality metrics, and best practice recommendations. A written personalized care plan for preventive services as well as  general preventive health recommendations were provided to patient.   Due to this being a telephonic visit, the after visit summary with patients personalized plan was offered to patient via mail or my-chart.  Patient preferred to pick up at office at next visit.   Marta Antu, LPN   01/09/5328  Nurse Health Advisor  Nurse Notes: Patient has questions about her Atorvastatin. She is questioning if she should get the second shingles vaccine because she had Covid in January & was treated with antibody therapy. She also is concerned about memory loss because she has a strong family history of dementia. She says she recently read an article that stated there is a blood test available to check for dementia. Appt made to see PCP on 08/19/20.

## 2020-08-17 NOTE — Patient Instructions (Signed)
Kelsey [name] , Thank you for taking time to complete your Medicare Wellness Visit. I appreciate your ongoing commitment to your health goals. Please review the following plan we discussed and let me know if I can assist you in the future.   Screening recommendations/referrals: Colonoscopy: Completed 01/06/2014-Not required after age 75 Mammogram: Completed 05/31/2020-Due 05/31/2021 Bone Density: Ordered today-Someone will be calling you to schedule. Recommended yearly ophthalmology/optometry visit for glaucoma screening and checkup Recommended yearly dental visit for hygiene and checkup  Vaccinations: Influenza vaccine: Up to date Pneumococcal vaccine: Completed vaccines Tdap vaccine: Up to date-Due-12/03/2023 Shingles vaccine: Completed first dose of Shingrix.  Covid-19:Completed two doses.  Advanced directives: Copy in chart  Conditions/risks identified: See problem list  Next appointment: Follow up in one year for your annual wellness visit    Preventive Care 65 Years and Older, Female Preventive care refers to lifestyle choices and visits with your health care provider that can promote health and wellness. What does preventive care include?  A yearly physical exam. This is also called an annual well check.  Dental exams once or twice a year.  Routine eye exams. Ask your health care provider how often you should have your eyes checked.  Personal lifestyle choices, including:  Daily care of your teeth and gums.  Regular physical activity.  Eating a healthy diet.  Avoiding tobacco and drug use.  Limiting alcohol use.  Practicing safe sex.  Taking low-dose aspirin every day.  Taking vitamin and mineral supplements as recommended by your health care provider. What happens during an annual well check? The services and screenings done by your health care provider during your annual well check will depend on your age, overall health, lifestyle risk factors, and family  history of disease. Counseling  Your health care provider may ask you questions about your:  Alcohol use.  Tobacco use.  Drug use.  Emotional well-being.  Home and relationship well-being.  Sexual activity.  Eating habits.  History of falls.  Memory and ability to understand (cognition).  Work and work Statistician.  Reproductive health. Screening  You may have the following tests or measurements:  Height, weight, and BMI.  Blood pressure.  Lipid and cholesterol levels. These may be checked every 5 years, or more frequently if you are over 106 years old.  Skin check.  Lung cancer screening. You may have this screening every year starting at age 55 if you have a 30-pack-year history of smoking and currently smoke or have quit within the past 15 years.  Fecal occult blood test (FOBT) of the stool. You may have this test every year starting at age 24.  Flexible sigmoidoscopy or colonoscopy. You may have a sigmoidoscopy every 5 years or a colonoscopy every 10 years starting at age 44.  Hepatitis C blood test.  Hepatitis B blood test.  Sexually transmitted disease (STD) testing.  Diabetes screening. This is done by checking your blood sugar (glucose) after you have not eaten for a while (fasting). You may have this done every 1-3 years.  Bone density scan. This is done to screen for osteoporosis. You may have this done starting at age 31.  Mammogram. This may be done every 1-2 years. Talk to your health care provider about how often you should have regular mammograms. Talk with your health care provider about your test results, treatment options, and if necessary, the need for more tests. Vaccines  Your health care provider may recommend certain vaccines, such as:  Influenza vaccine. This is  recommended every year.  Tetanus, diphtheria, and acellular pertussis (Tdap, Td) vaccine. You may need a Td booster every 10 years.  Zoster vaccine. You may need this after  age 37.  Pneumococcal 13-valent conjugate (PCV13) vaccine. One dose is recommended after age 9.  Pneumococcal polysaccharide (PPSV23) vaccine. One dose is recommended after age 85. Talk to your health care provider about which screenings and vaccines you need and how often you need them. This information is not intended to replace advice given to you by your health care provider. Make sure you discuss any questions you have with your health care provider. Document Released: 07/22/2015 Document Revised: 03/14/2016 Document Reviewed: 04/26/2015 Elsevier Interactive Patient Education  2017 [place] Prevention in the Home Falls can cause injuries. They can happen to people of all ages. There are many things you can do to make your home safe and to help prevent falls. What can I do on the outside of my home?  Regularly fix the edges of walkways and driveways and fix any cracks.  Remove anything that might make you trip as you walk through a door, such as a raised step or threshold.  Trim any bushes or trees on the path to your home.  Use bright outdoor lighting.  Clear any walking paths of anything that might make someone trip, such as rocks or tools.  Regularly check to see if handrails are loose or broken. Make sure that both sides of any steps have handrails.  Any raised decks and porches should have guardrails on the edges.  Have any leaves, snow, or ice cleared regularly.  Use sand or salt on walking paths during winter.  Clean up any spills in your garage right away. This includes oil or grease spills. What can I do in the bathroom?  Use night lights.  Install grab bars by the toilet and in the tub and shower. Do not use towel bars as grab bars.  Use non-skid mats or decals in the tub or shower.  If you need to sit down in the shower, use a plastic, non-slip stool.  Keep the floor dry. Clean up any water that spills on the floor as soon as it  happens.  Remove soap buildup in the tub or shower regularly.  Attach bath mats securely with double-sided non-slip rug tape.  Do not have throw rugs and other things on the floor that can make you trip. What can I do in the bedroom?  Use night lights.  Make sure that you have a light by your bed that is easy to reach.  Do not use any sheets or blankets that are too big for your bed. They should not hang down onto the floor.  Have a firm chair that has side arms. You can use this for support while you get dressed.  Do not have throw rugs and other things on the floor that can make you trip. What can I do in the kitchen?  Clean up any spills right away.  Avoid walking on wet floors.  Keep items that you use a lot in easy-to-reach places.  If you need to reach something above you, use a strong step stool that has a grab bar.  Keep electrical cords out of the way.  Do not use floor polish or wax that makes floors slippery. If you must use wax, use non-skid floor wax.  Do not have throw rugs and other things on the floor that can make you trip.  What can I do with my stairs?  Do not leave any items on the stairs.  Make sure that there are handrails on both sides of the stairs and use them. Fix handrails that are broken or loose. Make sure that handrails are as long as the stairways.  Check any carpeting to make sure that it is firmly attached to the stairs. Fix any carpet that is loose or worn.  Avoid having throw rugs at the top or bottom of the stairs. If you do have throw rugs, attach them to the floor with carpet tape.  Make sure that you have a light switch at the top of the stairs and the bottom of the stairs. If you do not have them, ask someone to add them for you. What else can I do to help prevent falls?  Wear shoes that:  Do not have high heels.  Have rubber bottoms.  Are comfortable and fit you well.  Are closed at the toe. Do not wear sandals.  If you  use a stepladder:  Make sure that it is fully opened. Do not climb a closed stepladder.  Make sure that both sides of the stepladder are locked into place.  Ask someone to hold it for you, if possible.  Clearly mark and make sure that you can see:  Any grab bars or handrails.  First and last steps.  Where the edge of each step is.  Use tools that help you move around (mobility aids) if they are needed. These include:  Canes.  Walkers.  Scooters.  Crutches.  Turn on the lights when you go into a dark area. Replace any light bulbs as soon as they burn out.  Set up your furniture so you have a clear path. Avoid moving your furniture around.  If any of your floors are uneven, fix them.  If there are any pets around you, be aware of where they are.  Review your medicines with your doctor. Some medicines can make you feel dizzy. This can increase your chance of falling. Ask your doctor what other things that you can do to help prevent falls. This information is not intended to replace advice given to you by your health care provider. Make sure you discuss any questions you have with your health care provider. Document Released: 04/21/2009 Document Revised: 12/01/2015 Document Reviewed: 07/30/2014 Elsevier Interactive Patient Education  2017 [employer].

## 2020-08-18 ENCOUNTER — Telehealth: Payer: Self-pay

## 2020-08-18 NOTE — Telephone Encounter (Signed)
Patient called yesterday & left a message for me to call her back. Attempted to reach patient today. No answer.

## 2020-08-19 ENCOUNTER — Encounter: Payer: Self-pay | Admitting: Family Medicine

## 2020-08-19 ENCOUNTER — Other Ambulatory Visit: Payer: Self-pay

## 2020-08-19 ENCOUNTER — Ambulatory Visit (INDEPENDENT_AMBULATORY_CARE_PROVIDER_SITE_OTHER): Payer: Medicare PPO | Admitting: Family Medicine

## 2020-08-19 VITALS — BP 96/59 | HR 86 | Temp 98.0°F | Wt 108.0 lb

## 2020-08-19 DIAGNOSIS — R634 Abnormal weight loss: Secondary | ICD-10-CM | POA: Diagnosis not present

## 2020-08-19 DIAGNOSIS — R5383 Other fatigue: Secondary | ICD-10-CM

## 2020-08-19 DIAGNOSIS — R413 Other amnesia: Secondary | ICD-10-CM | POA: Diagnosis not present

## 2020-08-19 DIAGNOSIS — Z23 Encounter for immunization: Secondary | ICD-10-CM | POA: Diagnosis not present

## 2020-08-19 LAB — COMPREHENSIVE METABOLIC PANEL
ALT: 20 U/L (ref 0–35)
AST: 25 U/L (ref 0–37)
Albumin: 4.2 g/dL (ref 3.5–5.2)
Alkaline Phosphatase: 77 U/L (ref 39–117)
BUN: 15 mg/dL (ref 6–23)
CO2: 29 mEq/L (ref 19–32)
Calcium: 9 mg/dL (ref 8.4–10.5)
Chloride: 101 mEq/L (ref 96–112)
Creatinine, Ser: 0.62 mg/dL (ref 0.40–1.20)
GFR: 87.49 mL/min (ref >60.00)
Glucose, Bld: 80 mg/dL (ref 70–99)
Potassium: 4.3 mEq/L (ref 3.5–5.1)
Sodium: 138 mEq/L (ref 135–145)
Total Bilirubin: 0.6 mg/dL (ref 0.2–1.2)
Total Protein: 7 g/dL (ref 6.0–8.3)

## 2020-08-19 LAB — CBC WITH DIFFERENTIAL/PLATELET
Basophils Absolute: 0 10*3/uL (ref 0.0–0.1)
Basophils Relative: 0.6 % (ref 0.0–3.0)
Eosinophils Absolute: 0.1 10*3/uL (ref 0.0–0.7)
Eosinophils Relative: 1.4 % (ref 0.0–5.0)
HCT: 38.4 % (ref 36.0–46.0)
Hemoglobin: 12.9 g/dL (ref 12.0–15.0)
Lymphocytes Relative: 19.5 % (ref 12.0–46.0)
Lymphs Abs: 1.1 10*3/uL (ref 0.7–4.0)
MCHC: 33.7 g/dL (ref 30.0–36.0)
MCV: 94.5 fl (ref 78.0–100.0)
Monocytes Absolute: 0.5 10*3/uL (ref 0.1–1.0)
Monocytes Relative: 8.5 % (ref 3.0–12.0)
Neutro Abs: 3.8 10*3/uL (ref 1.4–7.7)
Neutrophils Relative %: 70 % (ref 43.0–77.0)
Platelets: 246 10*3/uL (ref 150.0–400.0)
RBC: 4.06 Mil/uL (ref 3.87–5.11)
RDW: 14.6 % (ref 11.5–15.5)
WBC: 5.5 10*3/uL (ref 4.0–10.5)

## 2020-08-19 LAB — T4, FREE: Free T4: 1 ng/dL (ref 0.60–1.60)

## 2020-08-19 LAB — LIPID PANEL
Cholesterol: 274 mg/dL — ABNORMAL HIGH (ref 0–200)
HDL: 110.2 mg/dL (ref >39.00)
LDL Cholesterol: 144 mg/dL — ABNORMAL HIGH (ref 0–99)
NonHDL: 163.83
Total CHOL/HDL Ratio: 2
Triglycerides: 99 mg/dL (ref 0.0–149.0)
VLDL: 19.8 mg/dL (ref 0.0–40.0)

## 2020-08-19 LAB — T3, FREE: T3, Free: 3.2 pg/mL (ref 2.3–4.2)

## 2020-08-19 LAB — VITAMIN D 25 HYDROXY (VIT D DEFICIENCY, FRACTURES): VITD: 21.6 ng/mL — ABNORMAL LOW (ref 30.00–100.00)

## 2020-08-19 LAB — TSH: TSH: 1.35 u[IU]/mL (ref 0.35–4.50)

## 2020-08-19 LAB — VITAMIN B12: Vitamin B-12: 498 pg/mL (ref 211–911)

## 2020-08-19 NOTE — Patient Instructions (Signed)

## 2020-08-19 NOTE — Progress Notes (Signed)
This visit occurred during the SARS-CoV-2 public health emergency.  Safety protocols were in place, including screening questions prior to the visit, additional usage of staff PPE, and extensive cleaning of exam room while observing appropriate contact time as indicated for disinfecting solutions.    Kelsey [name] , May 06, 1946, 75 y.o., female MRN: [id number] Patient Care Team    Relationship Specialty Notifications Start End  [name], DO PCP - General Family Medicine  05/27/15   [name], MD Consulting Physician Gastroenterology  12/08/15   Calvert Cantor, MD Consulting Physician Ophthalmology  12/08/15   Lyndal Pulley, DO Consulting Physician Family Medicine  04/07/18     Chief Complaint  Patient presents with  . Memory Loss  . Hyperlipidemia    Pt is not fasting     Subjective: Pt presents for an OV with complaints of memory changes.  She reports she has noticed gradual changes in her memory over the last 3 years, however over the last 3 months she has noted precipitous decline.  She gives examples of misplacing keys and papers routinely.  She used to be well organized, and now she cannot seem to organize anything.  She has become from forgetful and has misplaced many objects.  Even cooking has become a challenge for her.  She states she can only use one burner at a time because she can't keep track and focus on what she is doing.  She also endorses she has been unable to gain weight.  She has not lost any weight.  She is active and cares for her husband in the home, which requires her full attention due to his dementia.  She denies any night sweats, chills or fevers.  She denies any bowel habit changes or dysuria.  She is worried that she may have Alzheimer's, which has run in her family with her maternal grandfather having Alzheimer's disease.  She has not had any medication changes.  She has not started any new over-the-counter medications.  She has a history of vitamin  D deficiency.  She has not been taking vitamin D or vitamins.  Depression screen [facility] 2/9 08/19/2020 08/17/2020 03/02/2020 12/26/2018 04/07/2018  Decreased Interest 0 0 1 0 0  Down, Depressed, Hopeless 1 0 0 0 0  PHQ - 2 Score 1 0 1 0 0  Altered sleeping 0 - - - -  Tired, decreased energy 3 - - - -  Change in appetite 0 - - - -  Feeling bad or failure about yourself  1 - - - -  Trouble concentrating 2 - - - -  Moving slowly or fidgety/restless 0 - - - -  Suicidal thoughts 0 - - - -  PHQ-9 Score 7 - - - -   GAD 7 : Generalized Anxiety Score 08/19/2020  Nervous, Anxious, on Edge 2  Control/stop worrying 2  Worry too much - different things 2  Trouble relaxing 2  Restless 1  Easily annoyed or irritable 1  Afraid - awful might happen 2  Total GAD 7 Score 12    Allergies  Allergen Reactions  . Penicillins Rash    Has patient had a PCN reaction causing immediate rash, facial/tongue/throat swelling, SOB or lightheadedness with hypotension: no Has patient had a PCN reaction causing severe rash involving mucus membranes or skin necrosis: yes Has patient had a PCN reaction that required hospitalization: no Has patient had a PCN reaction occurring within the last 10 years: no If all  of the above answers are "NO", then may proceed with Cephalosporin use.   . Sulfa Antibiotics Rash    Under arm and thighs   Social History   Social History Narrative  . Not on file   Past Medical History:  Diagnosis Date  . Allergy   . Anemia    iron deficiency  . 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 Bladder prolapse, female, acquired   . Chicken pox as a child  . Dysuria 09/13/2011  . Female bladder prolapse 09/13/2011  . Fibrocystic breast 09/14/2011  . GERD (gastroesophageal reflux disease)   . H/O measles   . H/O mumps   . Hyperlipidemia   . 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 Osteoarthrosis    right shoulder  . Osteoporosis    reclast in Oct  . Ovarian cyst 09/14/2011  . Renal cyst left   3 cm  . RMSF  [facility] spotted fever) 2017  . Situational anxiety 03/19/2014   R/t grieving process & caregiver burden for husband who has dementia, alzheimer's.   Marland Kitchen Uterine fibroid 11/2012   See Dr. Cletis Media note, surgery 11/2012; calcified- multiple  . Vaginitis 09/13/2011   Past Surgical History:  Procedure Laterality Date  . DILATATION & CURRETTAGE/HYSTEROSCOPY WITH RESECTOCOPE N/A 11/06/2012   Procedure: DILATATION & CURETTAGE/HYSTEROSCOPY WITH RESECTOCOPE;  Surgeon: Alwyn Pea, MD;  Location: [facility];  Service: Gynecology;  Laterality: N/A;  . HERNIA REPAIR  1992  . small intestine hernia  1994  . WISDOM TOOTH EXTRACTION     Family History  Problem Relation Age of Onset  . Heart disease Mother   . Osteoporosis Mother   . Cancer Father        prostate  . Osteoporosis Sister   . Gout Brother   . Gout Maternal Grandmother   . Heart disease Maternal Grandmother   . Alzheimer's disease Maternal Grandfather   . Osteoporosis Sister    Allergies as of 08/19/2020      Reactions   Penicillins Rash   Has patient had a PCN reaction causing immediate rash, facial/tongue/throat swelling, SOB or lightheadedness with hypotension: no Has patient had a PCN reaction causing severe rash involving mucus membranes or skin necrosis: yes Has patient had a PCN reaction that required hospitalization: no Has patient had a PCN reaction occurring within the last 10 years: no If all of the above answers are "NO", then may proceed with Cephalosporin use.   Sulfa Antibiotics Rash   Under arm and thighs      Medication List       Accurate as of August 19, 2020  5:02 PM. If you have any questions, ask your nurse or doctor.        STOP taking these medications   atorvastatin 10 MG tablet Commonly known as: LIPITOR Stopped by: Howard Pouch, DO   azithromycin 250 MG tablet Commonly known as: ZITHROMAX Stopped by: Howard Pouch, DO   benzonatate 200 MG capsule Commonly known as: TESSALON Stopped by:  Howard Pouch, DO   predniSONE 20 MG tablet Commonly known as: DELTASONE Stopped by: Howard Pouch, DO   Vitamin D (Ergocalciferol) 1.25 MG (50000 UNIT) Caps capsule Commonly known as: DRISDOL Stopped by: Howard Pouch, DO     TAKE these medications   famotidine 20 MG tablet Commonly known as: PEPCID TAKE 1 TABLET(20 MG) BY MOUTH TWICE DAILY   omeprazole 40 MG capsule Commonly known as: PRILOSEC TAKE 1 CAPSULE(40 MG) BY MOUTH IN THE MORNING AND AT BEDTIME What changed:  Another medication with the same name was removed. Continue taking this medication, and follow the directions you see here. Changed by: Howard Pouch, DO   sucralfate 1 g tablet Commonly known as: CARAFATE TAKE 1 TABLET(1 GRAM) BY MOUTH FOUR TIMES DAILY AT BEDTIME WITH MEALS       All past medical history, surgical history, allergies, family history, immunizations andmedications were updated in the EMR today and reviewed under the history and medication portions of their EMR.     ROS: Negative, with the exception of above mentioned in HPI   Objective:  BP (!) 96/59   Pulse 86   Temp 98 F (36.7 C) (Oral)   Wt 108 lb (49 kg)   SpO2 97%   BMI 17.97 kg/m  Body mass index is 17.97 kg/m. Gen: Afebrile. No acute distress. Nontoxic in appearance, well developed, well nourished.  Thin Asian female. HENT: AT. [place].  Cough.  No shortness of breath. Eyes:Pupils Equal Round Reactive to light, Extraocular movements intact,  Conjunctiva without redness, discharge or icterus. Neck/lymp/endocrine: Supple, no lymphadenopathy, no thyromegaly CV: RRR no murmur, no edema Chest: CTAB, no wheeze or crackles. Good air movement, normal resp effort.  Abd: Soft. NTND. BS present Neuro: Normal gait. PERLA. EOMi. Alert. Oriented x3  Psych: Normal affect, dress and demeanor. Normal speech. Normal thought content and judgment. Normal minicog completed today  No exam data present No results found.  Assessment/Plan: THANH POMERLEAU is a 75 y.o. female present for OV for  Memory changes/fatigue/unintentional weight loss -Patient is experiencing rather significant memory changes over the last 3 months with organizational skills and focus.  She is the sole caregiver for her husband with dementia.  She has positive family history for Alzheimer's disease. - RPR - TSH - Vitamin B12 - Lipid panel - CBC w/Diff - Vitamin D (25 hydroxy) - T4, free - T3, free - Comp Met (CMET) - ANA, IFA Comprehensive Panel-([facility]) - Rheumatoid Factor - Cyclic citrul peptide antibody, IgG ([facility]) - Protein,Total and Electrophor w/IFE-([facility]) -Will be called with results from all of her labs.  If labs do not indicate a cause of her memory changes would move forward with imaging of brain and possibly neurology referral.  Need for influenza vaccination - Flu Vaccine QUAD High Dose(Fluad)   Reviewed expectations re: course of current medical issues.  Discussed self-management of symptoms.  Outlined signs and symptoms indicating need for more acute intervention.  Patient verbalized understanding and all questions were answered.  Patient received an After-Visit Summary.    Orders Placed This Encounter  Procedures  . Flu Vaccine QUAD High Dose(Fluad)  . RPR  . TSH  . Vitamin B12  . Lipid panel  . CBC w/Diff  . Vitamin D (25 hydroxy)  . T4, free  . T3, free  . Comp Met (CMET)  . ANA, IFA Comprehensive Panel-([facility])  . Rheumatoid Factor  . Cyclic citrul peptide antibody, IgG ([facility])  . Protein,Total and Electrophor w/IFE-([facility])   No orders of the defined types were placed in this encounter.  Referral Orders  No referral(s) requested today     Note is dictated utilizing voice recognition software. Although note has been proof read prior to signing, occasional typographical errors still can be missed. If any questions arise, please do not hesitate to call for verification.   electronically signed by:  Howard Pouch, DO  [place]

## 2020-08-23 ENCOUNTER — Telehealth: Payer: Self-pay | Admitting: Family Medicine

## 2020-08-23 DIAGNOSIS — R413 Other amnesia: Secondary | ICD-10-CM

## 2020-08-23 LAB — ANA, IFA COMPREHENSIVE PANEL
Anti Nuclear Antibody (ANA): NEGATIVE
ENA SM Ab Ser-aCnc: <1 AI
SM/RNP: <1 AI
SSA (Ro) (ENA) Antibody, IgG: <1 AI
SSB (La) (ENA) Antibody, IgG: <1 AI
Scleroderma (Scl-70) (ENA) Antibody, IgG: <1 AI
ds DNA Ab: 1 IU/mL

## 2020-08-23 LAB — PROTEIN,TOTAL AND ELECTROPHOR W/IFE
Albumin ELP: 4.4 g/dL (ref 3.8–4.8)
Alpha 1: 0.2 g/dL (ref 0.2–0.3)
Alpha 2: 0.6 g/dL (ref 0.5–0.9)
Beta 2: 0.3 g/dL (ref 0.2–0.5)
Beta Globulin: 0.5 g/dL (ref 0.4–0.6)
Gamma Globulin: 0.9 g/dL (ref 0.8–1.7)
Total Protein: 6.9 g/dL (ref 6.1–8.1)

## 2020-08-23 LAB — RPR: RPR Ser Ql: NONREACTIVE

## 2020-08-23 LAB — CYCLIC CITRUL PEPTIDE ANTIBODY, IGG: Cyclic Citrullin Peptide Ab: <16 UNITS

## 2020-08-23 LAB — RHEUMATOID FACTOR: Rheumatoid fact SerPl-aCnc: <14 IU/mL (ref <14)

## 2020-08-23 MED ORDER — VITAMIN B-12 500 MCG SL SUBL: SUBLINGUAL_TABLET | SUBLINGUAL | 3 refills | Status: DC

## 2020-08-23 MED ORDER — VITAMIN D (ERGOCALCIFEROL) 1.25 MG (50000 UNIT) PO CAPS: 50000.0000 [IU] | ORAL_CAPSULE | ORAL | 3 refills | Status: DC

## 2020-08-23 NOTE — Telephone Encounter (Signed)
Please call patient: Her vitamin D levels are low at 21.  I have called in a once weekly vitamin D supplement to be taken with food.  I recommend she pick 1 day of the week, for example Sundays she takes her vitamin D and stick with that day so that she does not forget.  Her B12 levels are also lower than they have been in the past.  I have called a once daily B12 supplement that she would place underneath her tongue and let dissolve. Both supplements called into the [facility] she gets her other medications.   Both the above supplements can play a role with memory and fatigue and they are insufficient.  Although they both need to be replaced and continued as prescribed to improve her levels, it is unlikely it is the sole cause of the memory changes she is experiencing. Currently the rest of her labs that have resulted are normal, including liver, kidney and electrolyte panels.  Her thyroid panel is also normal.  There is still two test pending to look for autoimmune causes.  We will call her once we have these results.  Lastly, I have ordered an MRI of her brain at [facility] on [address].  Once insurance approves this imaging test, they will call her to schedule.  Once we have the remainder of the labs resulted and her MRI resulted, we will call her with results and discuss further plan.

## 2020-08-23 NOTE — Telephone Encounter (Addendum)
Her last labs have returned and both are negative for any autoimmune disease or abnormal proteins in the blood. Lastly, to answer her question about the Shingrix vaccine: I have not found any literature stating that she could not get the shingles vaccine after COVID-19 monoclonal antibody treatment.  The restrictions on vaccines after monoclonal antibody treatment seem to be only related to that of COVID-19 vaccines-in which you have to wait 90 days from infusion before receiving.  I would recommend at least waiting another 2 weeks to allow her body to completely recover from her Covid.  Also, we do not have record she had the 1st Shingrix vaccine.  And you please fax the pharmacy or location where she had this completed and add date to record.      Thanks.

## 2020-08-23 NOTE — Telephone Encounter (Signed)
Spoke with pt regarding labs and instructions.   

## 2020-08-23 NOTE — Telephone Encounter (Signed)
Pt would like know if she can have second shingles vaccine d/t antibodies for COVID

## 2020-08-26 ENCOUNTER — Telehealth: Payer: Self-pay

## 2020-08-26 NOTE — Telephone Encounter (Signed)
Spoke with pt and informed her that her B12 was sent to the pharmacy on 2/15. Pt will call pharmacy to check status.

## 2020-08-26 NOTE — Telephone Encounter (Signed)
Patient went to pharmacy and meds were not there for her Vitamin B12.  Patient picked up rx for Vitamin D. She also has another question regarding her labs, I wasn't sure exactly what she was asking, it was hard to understand her because of the language barrier. I asked her twice to repeat and she started from the beginning and talking about her Vit B12 not at being at the pharmacy.  I did not want her to continue to repeat herself so I told Mykelle I would have Dr. Lucita Lora clinical staff call her back, she said "okay, please have [facility] call me, I questions".  Adolm Joseph, please call patient at [phone],  Thank you

## 2020-08-26 NOTE — Telephone Encounter (Signed)
B12 not covered by insurance. Pt is to purchase rx OTC

## 2020-09-09 ENCOUNTER — Telehealth: Payer: Self-pay | Admitting: Family Medicine

## 2020-09-09 NOTE — Telephone Encounter (Signed)
Spoke with pt and informed her that she should take 500 mcg of B12 SL as rx

## 2020-09-09 NOTE — Telephone Encounter (Signed)
Patient would like to know what dosage of B12 she should take. Please call patient to advise.

## 2020-09-09 NOTE — Telephone Encounter (Signed)
Disregard. See other encounter. 

## 2020-09-10 NOTE — Progress Notes (Unsigned)
[facility] [place] Phone: [phone] Subjective:   Kelsey [name], am serving as a scribe for Dr. [name]. This visit occurred during the SARS-CoV-2 public health emergency.  Safety protocols were in place, including screening questions prior to the visit, additional usage of staff PPE, and extensive cleaning of exam room while observing appropriate contact time as indicated for disinfecting solutions.   I'm seeing this patient by the request  of:  [name], [name], DO  CC: Back and neck pain follow-up  [id number]  Kelsey [name] is a 75 y.o. female coming in with complaint of back and neck pain. OMT 08/12/2020. Patient states that she has had a few days where her pain increased. Movement and massage chair helped with her pain.   Medications patient has been prescribed: None          Reviewed prior external information including notes and imaging from previsou exam, outside providers and external EMR if available.   As well as notes that were available from care everywhere and other healthcare systems.  Past medical history, social, surgical and family history all reviewed in electronic medical record.  [name] pertanent information unless stated regarding to the chief complaint.   Past Medical History:  Diagnosis Date  . Allergy   . Anemia    iron deficiency  . 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 Bladder prolapse, female, acquired   . Chicken pox as a child  . Dysuria 09/13/2011  . Female bladder prolapse 09/13/2011  . Fibrocystic breast 09/14/2011  . GERD (gastroesophageal reflux disease)   . H/O measles   . H/O mumps   . Hyperlipidemia   . 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 Osteoarthrosis    right shoulder  . Osteoporosis    reclast in Oct  . Ovarian cyst 09/14/2011  . Renal cyst left   3 cm  . RMSF [facility] spotted fever) 2017  . Situational anxiety 03/19/2014   R/t grieving process & caregiver  burden for husband who has dementia, alzheimer's.   Marland Kitchen Uterine fibroid 11/2012   See Dr. Cletis Media note, surgery 11/2012; calcified- multiple  . Vaginitis 09/13/2011    Allergies  Allergen Reactions  . Penicillins Rash    Has patient had a PCN reaction causing immediate rash, facial/tongue/throat swelling, SOB or lightheadedness with hypotension: [name] Has patient had a PCN reaction causing severe rash involving mucus membranes or skin necrosis: yes Has patient had a PCN reaction that required hospitalization: [name] Has patient had a PCN reaction occurring within the last 10 years: [name] If all of the above answers are "[name]", then may proceed with Cephalosporin use.   . Sulfa Antibiotics Rash    Under arm and thighs     Review of Systems:  [name] headache, visual changes, nausea, vomiting, diarrhea, constipation, dizziness, abdominal pain, skin rash, fevers, chills, night sweats, weight loss, swollen lymph nodes, body aches, joint swelling, chest pain, shortness of breath, mood changes. POSITIVE muscle aches  Objective  Blood pressure 90/68, pulse 90, height 5\' 5"  (1.651 m), weight 107 lb (48.5 kg), SpO2 95 %.   General: [name] apparent distress alert and oriented x3 mood and affect normal, dressed appropriately.  HEENT: Pupils equal, extraocular movements intact  Respiratory: Patient's speak in full sentences and does not appear short of breath  Cardiovascular: [name] lower extremity edema, non tender, [name] erythema  Neuro: Cranial nerves II through XII are intact, neurovascularly intact in all extremities with 2+  DTRs and 2+ pulses.  Gait normal with good balance and coordination.  MSK:  Non tender with full range of motion and good stability and symmetric strength and tone of shoulders, elbows, wrist, hip, knee and ankles bilaterally.  Back - significant loss of lordosis ttp in the lower back patient does have some degenerative scoliosis noted as well.  Tightness noted with knee flexion.  Patient also has only 5  degrees of extension of the back.   Osteopathic findings   T8 extended rotated and side bent left L1 flexed rotated and side bent right Sacrum right on right       Assessment and Plan:  Degenerative lumbar spinal stenosis Degenerative changes noted.  Discussed posture and ergonomics, discussed which activities to do which wants to avoid.  Patient has been doing fairly well with swimming.  We will continue the vitamin D supplementation.  Chronic problem with intermittent mild exacerbations.  Worsening pain will need to consider the epidural.  Follow-up again 4 to 8 weeks    Nonallopathic problems  Decision today to treat with OMT was based on Physical Exam  After verbal consent patient was treated with HVLA, ME, FPR techniques in thoracic, lumbar, and sacral  areas  Patient tolerated the procedure well with improvement in symptoms  Patient given exercises, stretches and lifestyle modifications  See medications in patient instructions if given  Patient will follow up in 4-8 weeks      The above documentation has been reviewed and is accurate and complete Lyndal Pulley, DO       Note: This dictation was prepared with Dragon dictation along with smaller phrase technology. Any transcriptional errors that result from this process are unintentional.

## 2020-09-12 ENCOUNTER — Ambulatory Visit (INDEPENDENT_AMBULATORY_CARE_PROVIDER_SITE_OTHER): Payer: Medicare PPO | Admitting: Family Medicine

## 2020-09-12 ENCOUNTER — Encounter: Payer: Self-pay | Admitting: Family Medicine

## 2020-09-12 ENCOUNTER — Other Ambulatory Visit: Payer: Self-pay

## 2020-09-12 VITALS — BP 90/68 | HR 90 | Ht 65.0 in | Wt 107.0 lb

## 2020-09-12 DIAGNOSIS — M999 Biomechanical lesion, unspecified: Secondary | ICD-10-CM

## 2020-09-12 DIAGNOSIS — M48061 Spinal stenosis, lumbar region without neurogenic claudication: Secondary | ICD-10-CM | POA: Diagnosis not present

## 2020-09-12 NOTE — Patient Instructions (Signed)
Always a pleasure Keep swimming See me again in 4 weeks

## 2020-09-12 NOTE — Assessment & Plan Note (Signed)
Degenerative changes noted.  Discussed posture and ergonomics, discussed which activities to do which wants to avoid.  Patient has been doing fairly well with swimming.  We will continue the vitamin D supplementation.  Chronic problem with intermittent mild exacerbations.  Worsening pain will need to consider the epidural.  Follow-up again 4 to 8 weeks

## 2020-09-26 ENCOUNTER — Telehealth: Payer: Self-pay

## 2020-09-26 MED ORDER — OMEPRAZOLE 40 MG PO CPDR: DELAYED_RELEASE_CAPSULE | ORAL | 0 refills | Status: DC

## 2020-09-26 NOTE — Telephone Encounter (Signed)
rx sent.   LVM for pt to CB regarding rx and scheduling a [facility] appt

## 2020-09-26 NOTE — Telephone Encounter (Signed)
Appt sched. Pt aware

## 2020-09-26 NOTE — Telephone Encounter (Signed)
Patient refill request  omeprazole (PRILOSEC) 40 MG capsule [[id number]]   Please change pharmacy location to [facility] because of 90 d/s

## 2020-09-28 ENCOUNTER — Ambulatory Visit (INDEPENDENT_AMBULATORY_CARE_PROVIDER_SITE_OTHER): Payer: Medicare PPO | Admitting: Family Medicine

## 2020-09-28 ENCOUNTER — Telehealth: Payer: Self-pay | Admitting: Family Medicine

## 2020-09-28 ENCOUNTER — Encounter: Payer: Self-pay | Admitting: Family Medicine

## 2020-09-28 ENCOUNTER — Other Ambulatory Visit: Payer: Self-pay

## 2020-09-28 VITALS — BP 97/63 | HR 81 | Temp 98.1°F | Ht 65.0 in | Wt 106.0 lb

## 2020-09-28 DIAGNOSIS — E782 Mixed hyperlipidemia: Secondary | ICD-10-CM | POA: Diagnosis not present

## 2020-09-28 DIAGNOSIS — E559 Vitamin D deficiency, unspecified: Secondary | ICD-10-CM | POA: Diagnosis not present

## 2020-09-28 DIAGNOSIS — E538 Deficiency of other specified B group vitamins: Secondary | ICD-10-CM

## 2020-09-28 DIAGNOSIS — M810 Age-related osteoporosis without current pathological fracture: Secondary | ICD-10-CM

## 2020-09-28 DIAGNOSIS — K219 Gastro-esophageal reflux disease without esophagitis: Secondary | ICD-10-CM

## 2020-09-28 MED ORDER — OMEPRAZOLE 40 MG PO CPDR: 40.0000 mg | DELAYED_RELEASE_CAPSULE | Freq: Every day | ORAL | 1 refills | Status: DC

## 2020-09-28 MED ORDER — ATORVASTATIN CALCIUM 10 MG PO TABS: 10.0000 mg | ORAL_TABLET | Freq: Every evening | ORAL | 3 refills | Status: DC

## 2020-09-28 NOTE — Telephone Encounter (Signed)
Regarding MRI ordered 08/23/20: Patient has not been scheduled yet. I called [employer] and updated Auth# [id number] dates 09/28/20 - 10/28/20 per [place]. I called [facility], per [place] they have left several messages for patient. I attempted to schedule patient but Karena Addison said she has to speak with her to screen. Karena Addison will attempt to call her again now for scheduling.

## 2020-09-28 NOTE — Telephone Encounter (Signed)
FYI

## 2020-09-28 NOTE — Patient Instructions (Addendum)
  Stop famotidine.  Continue omeprazole daily. Stop Carafate (chalky one)   We will call you with scheduling MRI.   Restart atorvastatin in the evening.    Memory Compensation Strategies  1. Use "WARM" strategy.  W= write it down  A= associate it  R= repeat it  M= make a mental note  2.   You can keep a Social worker.  Use a 3-ring notebook with sections for the following: calendar, important names and phone numbers,  medications, doctors' names/phone numbers, lists/reminders, and a section to journal what you did  each day.   3.    Use a calendar to write appointments down.  4.    Write yourself a schedule for the day.  This can be placed on the calendar or in a separate section of the Memory Notebook.  Keeping a  regular schedule can help memory.  5.    Use medication organizer with sections for each day or morning/evening pills.  You may need help loading it  6.    Keep a basket, or pegboard by the door.  Place items that you need to take out with you in the basket or on the pegboard.  You may also want to  include a message board for reminders.  7.    Use sticky notes.  Place sticky notes with reminders in a place where the task is performed.  For example: " turn off the  stove" placed by the stove, "lock the door" placed on the door at eye level, " take your medications" on  the bathroom mirror or by the place where you normally take your medications.  8.    Use alarms/timers.  Use while cooking to remind yourself to check on food or as a reminder to take your medicine, or as a  reminder to make a call, or as a reminder to perform another task, etc.

## 2020-09-28 NOTE — Progress Notes (Signed)
This visit occurred during the SARS-CoV-2 public health emergency.  Safety protocols were in place, including screening questions prior to the visit, additional usage of staff PPE, and extensive cleaning of exam room while observing appropriate contact time as indicated for disinfecting solutions.    Kelsey [name] , Nov 28, 1945, 75 y.o., female MRN: [id number] Patient Care Team    Relationship Specialty Notifications Start End  [name], DO PCP - General Family Medicine  05/27/15   [name], MD Consulting Physician Gastroenterology  12/08/15   Calvert Cantor, MD Consulting Physician Ophthalmology  12/08/15   Lyndal Pulley, DO Consulting Physician Family Medicine  04/07/18     Chief Complaint  Patient presents with  . Follow-up    [facility]; pt is fasting     Subjective: Pt presents for an OV follow-up on her chronic medical conditions.   Memory changes/B12 deficiency/vitamin D deficiency: 08/19/2020 patient presented with memory changes described below.  She was found to have mildly low vitamin D deficiency and a mild B12 deficiency.  Other laboratory work-up has been normal including TSH, CMP, SPEP, CBC ANA.  She was also scheduled to have an MRI of her brain which she states she did not receive a call to schedule. Prior note:  Of memory changes.  She reports she has noticed gradual changes in her memory over the last 3 years, however over the last 3 months she has noted precipitous decline.  She gives examples of misplacing keys and papers routinely.  She used to be well organized, and now she cannot seem to organize anything.  She has become from forgetful and has misplaced many objects.  Even cooking has become a challenge for her.  She states she can only use one burner at a time because she can't keep track and focus on what she is doing.  She also endorses she has been unable to gain weight.  She has not lost any weight.  She is active and cares for her husband in the home,  which requires her full attention due to his dementia.  She denies any night sweats, chills or fevers.  She denies any bowel habit changes or dysuria.  She is worried that she may have Alzheimer's, which has run in her family with her maternal grandfather having Alzheimer's disease.  She has not had any medication changes.  She has not started any new over-the-counter medications.  She has a history of vitamin D deficiency.  She has not been taking vitamin D or vitamins.  Osteoporosis/vit d def: SHe had been on schedule for Prolia injection every 6 months-however she has not had an injection in quite some time.  She is uncertain why..  She is taking vitamin D.  Hyperlipidemia: 12/26/2018 CV risk score 12.9%.  Patient declines starting statin at that time and wanted to work on her diet and exercise routine.  She had made dietary changes and tried to increase her exercise. Despite her efforts her LDL increased . She started statin in Dec 2020> she is tolerating statin without complaints. However, she does endorse mild discomfort in her RUQ. She states it is not pain> just an "awareness". She denies nausea.   Stop statin then, and has never restarted.  The right upper quadrant discomfort was not associated with a statin.  Labs collected 08/19/2020 resulted with a LDL of 144, however her HDL was great at 110.  GERD: Patient was placed on PPI, Pepcid and Carafate.  She was also  seen by gastroenterology which changed some of the timings but since continue the same meds.  Patient is wondering today if she can discontinue any of these meds.  She states she forgets to take the Pepcid routinely.  She is continuing to take the omeprazole in the morning.  Depression screen [facility] 2/9 08/19/2020 08/17/2020 03/02/2020 12/26/2018 04/07/2018  Decreased Interest 0 0 1 0 0  Down, Depressed, Hopeless 1 0 0 0 0  PHQ - 2 Score 1 0 1 0 0  Altered sleeping 0 - - - -  Tired, decreased energy 3 - - - -  Change in appetite 0 - - - -   Feeling bad or failure about yourself  1 - - - -  Trouble concentrating 2 - - - -  Moving slowly or fidgety/restless 0 - - - -  Suicidal thoughts 0 - - - -  PHQ-9 Score 7 - - - -   GAD 7 : Generalized Anxiety Score 08/19/2020  Nervous, Anxious, on Edge 2  Control/stop worrying 2  Worry too much - different things 2  Trouble relaxing 2  Restless 1  Easily annoyed or irritable 1  Afraid - awful might happen 2  Total GAD 7 Score 12    Allergies  Allergen Reactions  . Penicillins Rash    Has patient had a PCN reaction causing immediate rash, facial/tongue/throat swelling, SOB or lightheadedness with hypotension: no Has patient had a PCN reaction causing severe rash involving mucus membranes or skin necrosis: yes Has patient had a PCN reaction that required hospitalization: no Has patient had a PCN reaction occurring within the last 10 years: no If all of the above answers are "NO", then may proceed with Cephalosporin use.   . Sulfa Antibiotics Rash    Under arm and thighs   Social History   Social History Narrative  . Not on file   Past Medical History:  Diagnosis Date  . Allergy   . Anemia    iron deficiency  . 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 Bladder prolapse, female, acquired   . Chicken pox as a child  . Dysuria 09/13/2011  . Female bladder prolapse 09/13/2011  . Fibrocystic breast 09/14/2011  . GERD (gastroesophageal reflux disease)   . H/O measles   . H/O mumps   . Hyperlipidemia   . 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 Osteoarthrosis    right shoulder  . Osteoporosis    reclast in Oct  . Ovarian cyst 09/14/2011  . Renal cyst left   3 cm  . RMSF [facility] spotted fever) 2017  . Situational anxiety 03/19/2014   R/t grieving process & caregiver burden for husband who has dementia, alzheimer's.   Marland Kitchen Uterine fibroid 11/2012   See Dr. Cletis Media note, surgery 11/2012; calcified- multiple  . Vaginitis 09/13/2011   Past Surgical History:  Procedure Laterality Date  .  DILATATION & CURRETTAGE/HYSTEROSCOPY WITH RESECTOCOPE N/A 11/06/2012   Procedure: DILATATION & CURETTAGE/HYSTEROSCOPY WITH RESECTOCOPE;  Surgeon: Alwyn Pea, MD;  Location: [facility];  Service: Gynecology;  Laterality: N/A;  . HERNIA REPAIR  1992  . small intestine hernia  1994  . WISDOM TOOTH EXTRACTION     Family History  Problem Relation Age of Onset  . Heart disease Mother   . Osteoporosis Mother   . Cancer Father        prostate  . Osteoporosis Sister   . Gout Brother   . Gout Maternal Grandmother   . Heart disease Maternal Grandmother   .  Alzheimer's disease Maternal Grandfather   . Osteoporosis Sister    Allergies as of 09/28/2020      Reactions   Penicillins Rash   Has patient had a PCN reaction causing immediate rash, facial/tongue/throat swelling, SOB or lightheadedness with hypotension: no Has patient had a PCN reaction causing severe rash involving mucus membranes or skin necrosis: yes Has patient had a PCN reaction that required hospitalization: no Has patient had a PCN reaction occurring within the last 10 years: no If all of the above answers are "NO", then may proceed with Cephalosporin use.   Sulfa Antibiotics Rash   Under arm and thighs      Medication List       Accurate as of September 28, 2020 11:59 PM. If you have any questions, ask your nurse or doctor.        atorvastatin 10 MG tablet Commonly known as: LIPITOR Take 1 tablet (10 mg total) by mouth every evening. Started by: Howard Pouch, DO   famotidine 20 MG tablet Commonly known as: PEPCID TAKE 1 TABLET(20 MG) BY MOUTH TWICE DAILY   omeprazole 40 MG capsule Commonly known as: PRILOSEC Take 1 capsule (40 mg total) by mouth daily. What changed:   how much to take  how to take this  when to take this  additional instructions Changed by: Howard Pouch, DO   sucralfate 1 g tablet Commonly known as: CARAFATE TAKE 1 TABLET(1 GRAM) BY MOUTH FOUR TIMES DAILY AT BEDTIME WITH MEALS   Vitamin  B-12 500 MCG Subl 1 tab placed under the tongue daily What changed:   how much to take  additional instructions   Vitamin D (Ergocalciferol) 1.25 MG (50000 UNIT) Caps capsule Commonly known as: DRISDOL Take 1 capsule (50,000 Units total) by mouth every 7 (seven) days.       All past medical history, surgical history, allergies, family history, immunizations andmedications were updated in the EMR today and reviewed under the history and medication portions of their EMR.     ROS: Negative, with the exception of above mentioned in HPI   Objective:  BP 97/63   Pulse 81   Temp 98.1 F (36.7 C) (Oral)   Ht 5\' 5"  (1.651 m)   Wt 106 lb (48.1 kg)   SpO2 98%   BMI 17.64 kg/m  Body mass index is 17.64 kg/m. Gen: Afebrile. No acute distress. Nontoxic thin female.  HENT: AT. [place].  Eyes:Pupils Equal Round Reactive to light, Extraocular movements intact,  Conjunctiva without redness, discharge or icterus. Neck/lymp/endocrine: Supple,no lymphadenopathy, no thyromegaly CV: RRR no murmurn, o edema, +2/4 P posterior tibialis pulses Chest: CTAB, no wheeze or crackles Abd: Soft. NTND. BS present. no Masses palpated. .  Neuro:  Normal gait. PERLA. EOMi. Alert. Oriented x3 Psych: Normal affect, dress and demeanor. Normal speech. Normal thought content and judgment.    No exam data present No results found.  Assessment/Plan: Kelsey [name] is a 74 y.o. female present for OV for [facility] Memory changes/fatigue/unintentional weight loss/B12 deficiency/vitamin D deficiency -Patient is experiencing rather significant memory changes over the last 3 months with organizational skills and focus.  She is the sole caregiver for her husband with dementia.  She has positive family history for Alzheimer's disease. Laboratory work-up thus far has been normal outside of mild vitamin D deficiency and low and normal B12. Patient reports the B12 supplementation has helped with her energy. She is taking  vitamin D. She is trying to add Ensure twice a day.  Also encouraged her to try boost. MRI was approved for her.  However they have been unsuccessful in attempting to contact her to get her scheduled.  They will reach out to her again. Future lab placed to recheck vitamin D and B12 in 8 weeks  Mixed hyperlipidemia/ Encounter for monitoring statin therapy Restart low-dose atorvastatin to help provide cardiovascular protection. Future labs placed to recheck lipids and LFT in 8 weeks  Osteoporosis without current pathological fracture, unspecified osteoporosis type/Vitamin D deficiency She has her repeat DEXA scheduled in July. Continue vitamin D We will check into Prolia and see if she needs to be prior Auth again.  She will then be called and scheduled to restart Prolia.  GERD: Reflux has been stable. Encouraged her to first stop the famotidine completely and the Carafate.  If symptoms do not return within a few weeks, she can stay off these medications. Continue omeprazole 40 mg daily.  Symptoms remain well controlled can consider trying this medication every other day.   Reviewed expectations re: course of current medical issues.  Discussed self-management of symptoms.  Outlined signs and symptoms indicating need for more acute intervention.  Patient verbalized understanding and all questions were answered.  Patient received an After-Visit Summary.    Orders Placed This Encounter  Procedures  . Hepatic function panel  . Lipid panel  . B12  . Vitamin D (25 hydroxy)   Meds ordered this encounter  Medications  . omeprazole (PRILOSEC) 40 MG capsule    Sig: Take 1 capsule (40 mg total) by mouth daily.    Dispense:  90 capsule    Refill:  1  . atorvastatin (LIPITOR) 10 MG tablet    Sig: Take 1 tablet (10 mg total) by mouth every evening.    Dispense:  90 tablet    Refill:  3   Referral Orders  No referral(s) requested today     Note is dictated utilizing voice  recognition software. Although note has been proof read prior to signing, occasional typographical errors still can be missed. If any questions arise, please do not hesitate to call for verification.   electronically signed by:  Howard Pouch, DO  [place]

## 2020-10-09 NOTE — Progress Notes (Signed)
[place] Phone: [phone] Subjective:   Kelsey [name], am serving as a scribe for Dr. [name].This visit occurred during the SARS-CoV-2 public health emergency.  Safety protocols were in place, including screening questions prior to the visit, additional usage of staff PPE, and extensive cleaning of exam room while observing appropriate contact time as indicated for disinfecting solutions.   I'm seeing this patient by the request  of:  [name], [name], DO  CC: low back pain follow up   [id number]  Kelsey [name] is a 75 y.o. female coming in with complaint of back and neck pain. OMT 09/12/2020. Patient states that her back pain is the same as last visit but not as bad as it has been before. Patient pulled some weeds today and her back is more painful.   Medications patient has been prescribed: None Taking:         Reviewed prior external information including notes and imaging from previsou exam, outside providers and external EMR if available.   As well as notes that were available from care everywhere and other healthcare systems.  Past medical history, social, surgical and family history all reviewed in electronic medical record.  [name] pertanent information unless stated regarding to the chief complaint.   Past Medical History:  Diagnosis Date  . Allergy   . Anemia    iron deficiency  . 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 Bladder prolapse, female, acquired   . Chicken pox as a child  . Dysuria 09/13/2011  . Female bladder prolapse 09/13/2011  . Fibrocystic breast 09/14/2011  . GERD (gastroesophageal reflux disease)   . H/O measles   . H/O mumps   . Hyperlipidemia   . 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 Osteoarthrosis    right shoulder  . Osteoporosis    reclast in Oct  . Ovarian cyst 09/14/2011  . Renal cyst left   3 cm  . RMSF [facility] spotted fever) 2017  . Situational anxiety  03/19/2014   R/t grieving process & caregiver burden for husband who has dementia, alzheimer's.   Marland Kitchen Uterine fibroid 11/2012   See Dr. Cletis Media note, surgery 11/2012; calcified- multiple  . Vaginitis 09/13/2011    Allergies  Allergen Reactions  . Penicillins Rash    Has patient had a PCN reaction causing immediate rash, facial/tongue/throat swelling, SOB or lightheadedness with hypotension: [name] Has patient had a PCN reaction causing severe rash involving mucus membranes or skin necrosis: yes Has patient had a PCN reaction that required hospitalization: [name] Has patient had a PCN reaction occurring within the last 10 years: [name] If all of the above answers are "[name]", then may proceed with Cephalosporin use.   . Sulfa Antibiotics Rash    Under arm and thighs     Review of Systems:  [name] headache, visual changes, nausea, vomiting, diarrhea, constipation, dizziness, abdominal pain, skin rash, fevers, chills, night sweats, weight loss, swollen lymph nodes, body aches, joint swelling, chest pain, shortness of breath, mood changes. POSITIVE muscle aches  Objective  There were [name] vitals taken for this visit.   General: [name] apparent distress alert and oriented x3 mood and affect normal, dressed appropriately.  HEENT: Pupils equal, extraocular movements intact  Respiratory: Patient's speak in full sentences and does not appear short of breath  Cardiovascular: [name] lower extremity edema, non tender, [name] erythema   Gait normal with good balance and coordination.  MSK: Arthritic changes of multiple  joints Back - loss of lordosis and deg scoliosis noted, only 5 degrees of extension  Tightness with Corky Sox NVI distally   Osteopathic findings  C2 flexed rotated and side bent right C6 flexed rotated and side bent left T9 extended rotated and side bent left L2 flexed rotated and side bent right Sacrum right on right       Assessment and Plan:  Degenerative lumbar spinal stenosis Significant spinal  stenosis.  Discussed with patient at great length, discussed which activities to do which wants to avoid.  We discussed the possibility again of the potential injection that I think would be beneficial.  Patient wants to hold at this time.  Patient will continue to stay active.  Follow-up with me again in 4 weeks     Nonallopathic problems  Decision today to treat with OMT was based on Physical Exam  After verbal consent patient was treated with, ME, FPR techniques in cervical,, thoracic, lumbar, and sacral  areas  Patient tolerated the procedure well with improvement in symptoms  Patient given exercises, stretches and lifestyle modifications  See medications in patient instructions if given  Patient will follow up in 4-8 weeks      The above documentation has been reviewed and is accurate and complete Lyndal Pulley, DO       Note: This dictation was prepared with Dragon dictation along with smaller phrase technology. Any transcriptional errors that result from this process are unintentional.

## 2020-10-10 ENCOUNTER — Ambulatory Visit (INDEPENDENT_AMBULATORY_CARE_PROVIDER_SITE_OTHER): Payer: Medicare PPO | Admitting: Family Medicine

## 2020-10-10 ENCOUNTER — Other Ambulatory Visit: Payer: Self-pay

## 2020-10-10 ENCOUNTER — Encounter: Payer: Self-pay | Admitting: Family Medicine

## 2020-10-10 VITALS — BP 98/68 | HR 65 | Ht 65.0 in | Wt 106.0 lb

## 2020-10-10 DIAGNOSIS — M9901 Segmental and somatic dysfunction of cervical region: Secondary | ICD-10-CM | POA: Diagnosis not present

## 2020-10-10 DIAGNOSIS — M48061 Spinal stenosis, lumbar region without neurogenic claudication: Secondary | ICD-10-CM

## 2020-10-10 DIAGNOSIS — M999 Biomechanical lesion, unspecified: Secondary | ICD-10-CM

## 2020-10-10 NOTE — Patient Instructions (Signed)
Good to see you If you change your mind on injection give [place] a call See me in 4 weeks

## 2020-10-10 NOTE — Assessment & Plan Note (Signed)
Significant spinal stenosis.  Discussed with patient at great length, discussed which activities to do which wants to avoid.  We discussed the possibility again of the potential injection that I think would be beneficial.  Patient wants to hold at this time.  Patient will continue to stay active.  Follow-up with me again in 4 weeks

## 2020-10-17 ENCOUNTER — Other Ambulatory Visit: Payer: Self-pay

## 2020-10-17 ENCOUNTER — Ambulatory Visit
Admission: RE | Admit: 2020-10-17 | Discharge: 2020-10-17 | Disposition: A | Discharge status: Home / Self Care | Payer: Medicare PPO | Source: Ambulatory Visit | Attending: Family Medicine | Admitting: Family Medicine

## 2020-10-17 DIAGNOSIS — J019 Acute sinusitis, unspecified: Secondary | ICD-10-CM | POA: Diagnosis not present

## 2020-10-17 DIAGNOSIS — R413 Other amnesia: Secondary | ICD-10-CM

## 2020-10-17 MED ORDER — GADOBENATE DIMEGLUMINE 529 MG/ML IV SOLN
9.0000 mL | Freq: Once | INTRAVENOUS | Status: AC | PRN
  Administered 2020-10-17: 9 mL via INTRAVENOUS

## 2020-10-19 ENCOUNTER — Telehealth: Payer: Self-pay | Admitting: Family Medicine

## 2020-10-19 DIAGNOSIS — R413 Other amnesia: Secondary | ICD-10-CM

## 2020-10-19 NOTE — Addendum Note (Signed)
Addended by: Kavin Leech on: 10/19/2020 08:52 AM   Modules accepted: Orders

## 2020-10-19 NOTE — Telephone Encounter (Signed)
Spoke with pt regarding results and instructions. Pt would like to seen a neurologist and poss start meds later.

## 2020-10-19 NOTE — Telephone Encounter (Signed)
Please inform patient: The MRI of her brain was normal with no abnormal finding, to suggest cause for her memory changes. -Since her MRI and her labs were essentially normal without identifying a cause of her memory changes, the next step could be either to start a medication to help slow down the progress of possible dementia versus referral to neurology, or both.   -Please ask patient when she prefers and I will place the orders, if she desires?   Thanks

## 2020-11-01 ENCOUNTER — Encounter: Payer: Self-pay | Admitting: Neurology

## 2020-11-08 ENCOUNTER — Other Ambulatory Visit: Payer: Self-pay

## 2020-11-08 ENCOUNTER — Ambulatory Visit (INDEPENDENT_AMBULATORY_CARE_PROVIDER_SITE_OTHER): Payer: Medicare PPO | Admitting: Family Medicine

## 2020-11-08 ENCOUNTER — Encounter: Payer: Self-pay | Admitting: Family Medicine

## 2020-11-08 VITALS — BP 96/62 | HR 81 | Ht 65.0 in | Wt 108.0 lb

## 2020-11-08 DIAGNOSIS — M9902 Segmental and somatic dysfunction of thoracic region: Secondary | ICD-10-CM | POA: Diagnosis not present

## 2020-11-08 DIAGNOSIS — M9904 Segmental and somatic dysfunction of sacral region: Secondary | ICD-10-CM | POA: Diagnosis not present

## 2020-11-08 DIAGNOSIS — M48061 Spinal stenosis, lumbar region without neurogenic claudication: Secondary | ICD-10-CM | POA: Diagnosis not present

## 2020-11-08 DIAGNOSIS — M9903 Segmental and somatic dysfunction of lumbar region: Secondary | ICD-10-CM | POA: Diagnosis not present

## 2020-11-08 NOTE — Assessment & Plan Note (Signed)
Patient does have arthritic changes in multiple joints.  Discussed with patient about icing regimen, home exercises.  Still not taking any significant medications.  Responding well to manipulation.  Discussed potentially staying well-hydrated as well as different stretches for her lower back.  Follow-up with me again in 6 weeks

## 2020-11-08 NOTE — Patient Instructions (Signed)
Please take care of yourself Good luck with the bamboo Stretch after yard work See me in 4-6 weeks

## 2020-11-08 NOTE — Progress Notes (Signed)
[place] Phone: [phone] Subjective:   Kelsey [name], am serving as a scribe for Dr. [name]. This visit occurred during the SARS-CoV-2 public health emergency.  Safety protocols were in place, including screening questions prior to the visit, additional usage of staff PPE, and extensive cleaning of exam room while observing appropriate contact time as indicated for disinfecting solutions.   I'm seeing this patient by the request  of:  [name], [name], DO  CC: Low back pain follow-up  [id number]  Kelsey [name] is a 75 y.o. female coming in with complaint of back and neck pain. OMT 10/10/2020. Patient states that her back is doing fine but her R hip is bothering her since digging in ground.  Patient has noticed some more tightness in her back.  Patient does have known spinal stenosis.  Patient has been wearing a new activity tracker that is stating that she is doing 17,000 steps a day.  Has been very active outside at the moment and is wondering if that is okay.  Has noticed more tightness.  Medications patient has been prescribed: None  Taking:         Reviewed prior external information including notes and imaging from previsou exam, outside providers and external EMR if available.   As well as notes that were available from care everywhere and other healthcare systems.  Past medical history, social, surgical and family history all reviewed in electronic medical record.  [name] pertanent information unless stated regarding to the chief complaint.   Past Medical History:  Diagnosis Date  . Allergy   . Anemia    iron deficiency  . 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 Bladder prolapse, female, acquired   . Chicken pox as a child  . Dysuria 09/13/2011  . Female bladder prolapse 09/13/2011  . Fibrocystic breast 09/14/2011  . GERD (gastroesophageal reflux disease)   . H/O measles   . H/O mumps   .  Hyperlipidemia   . 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 Osteoarthrosis    right shoulder  . Osteoporosis    reclast in Oct  . Ovarian cyst 09/14/2011  . Renal cyst left   3 cm  . RMSF [facility] spotted fever) 2017  . Situational anxiety 03/19/2014   R/t grieving process & caregiver burden for husband who has dementia, alzheimer's.   Marland Kitchen Uterine fibroid 11/2012   See Dr. Cletis Media note, surgery 11/2012; calcified- multiple  . Vaginitis 09/13/2011    Allergies  Allergen Reactions  . Penicillins Rash    Has patient had a PCN reaction causing immediate rash, facial/tongue/throat swelling, SOB or lightheadedness with hypotension: [name] Has patient had a PCN reaction causing severe rash involving mucus membranes or skin necrosis: yes Has patient had a PCN reaction that required hospitalization: [name] Has patient had a PCN reaction occurring within the last 10 years: [name] If all of the above answers are "[name]", then may proceed with Cephalosporin use.   . Sulfa Antibiotics Rash    Under arm and thighs     Review of Systems:  [name] headache, visual changes, nausea, vomiting, diarrhea, constipation, dizziness, abdominal pain, skin rash, fevers, chills, night sweats, weight loss, swollen lymph nodes,  joint swelling, chest pain, shortness of breath, mood changes. POSITIVE muscle aches, body aches  Objective  Blood pressure 96/62, pulse 81, height 5\' 5"  (1.651 m), weight 108 lb (49 kg), SpO2 96 %.   General: [name] apparent distress alert  and oriented x3 mood and affect normal, dressed appropriately.  Underwent HEENT: Pupils equal, extraocular movements intact  Respiratory: Patient's speak in full sentences and does not appear short of breath  Cardiovascular: [name] lower extremity edema, non tender, [name] erythema  Significant tightness noted.  Patient does have tenderness to palpation in the paraspinal musculature of the lumbar spine.  Limited range of motion especially with left-sided rotation of the back and tightness  of the FABER test bilaterally.  Tightness with straight leg test.  Lacks the last 15 degrees of extension of the back    Osteopathic findings  T6 extended rotated and side bent left L1 flexed rotated and side bent right L F RS left  Sacrum right on right       Assessment and Plan:  Degenerative lumbar spinal stenosis Patient does have arthritic changes in multiple joints.  Discussed with patient about icing regimen, home exercises.  Still not taking any significant medications.  Responding well to manipulation.  Discussed potentially staying well-hydrated as well as different stretches for her lower back.  Follow-up with me again in 6 weeks    Nonallopathic problems  Decision today to treat with OMT was based on Physical Exam  After verbal consent patient was treated with HVLA, ME, FPR techniques in  thoracic, lumbar, and sacral  areas  Patient tolerated the procedure well with improvement in symptoms  Patient given exercises, stretches and lifestyle modifications  See medications in patient instructions if given  Patient will follow up in 6 weeks      The above documentation has been reviewed and is accurate and complete Lyndal Pulley, DO       Note: This dictation was prepared with Dragon dictation along with smaller phrase technology. Any transcriptional errors that result from this process are unintentional.

## 2020-11-18 ENCOUNTER — Telehealth: Payer: Self-pay

## 2020-11-18 NOTE — Telephone Encounter (Signed)
Attempted to contact pt to schedule prolia injections. Could not LVM. Pt can have prolia at upcoming appt 11/23/20

## 2020-11-23 ENCOUNTER — Ambulatory Visit (INDEPENDENT_AMBULATORY_CARE_PROVIDER_SITE_OTHER): Payer: Medicare PPO

## 2020-11-23 ENCOUNTER — Other Ambulatory Visit: Payer: Self-pay

## 2020-11-23 DIAGNOSIS — E782 Mixed hyperlipidemia: Secondary | ICD-10-CM | POA: Diagnosis not present

## 2020-11-23 DIAGNOSIS — E559 Vitamin D deficiency, unspecified: Secondary | ICD-10-CM | POA: Diagnosis not present

## 2020-11-23 DIAGNOSIS — M810 Age-related osteoporosis without current pathological fracture: Secondary | ICD-10-CM | POA: Diagnosis not present

## 2020-11-23 DIAGNOSIS — E538 Deficiency of other specified B group vitamins: Secondary | ICD-10-CM

## 2020-11-23 LAB — HEPATIC FUNCTION PANEL
ALT: 21 U/L (ref 0–35)
AST: 25 U/L (ref 0–37)
Albumin: 4.4 g/dL (ref 3.5–5.2)
Alkaline Phosphatase: 83 U/L (ref 39–117)
Bilirubin, Direct: 0.1 mg/dL (ref 0.0–0.3)
Total Bilirubin: 0.7 mg/dL (ref 0.2–1.2)
Total Protein: 6.9 g/dL (ref 6.0–8.3)

## 2020-11-23 LAB — LIPID PANEL
Cholesterol: 199 mg/dL (ref 0–200)
HDL: 99.5 mg/dL (ref >39.00)
LDL Cholesterol: 87 mg/dL (ref 0–99)
NonHDL: 99.23
Total CHOL/HDL Ratio: 2
Triglycerides: 59 mg/dL (ref 0.0–149.0)
VLDL: 11.8 mg/dL (ref 0.0–40.0)

## 2020-11-23 LAB — VITAMIN B12: Vitamin B-12: 809 pg/mL (ref 211–911)

## 2020-11-23 LAB — VITAMIN D 25 HYDROXY (VIT D DEFICIENCY, FRACTURES): VITD: 27.64 ng/mL — ABNORMAL LOW (ref 30.00–100.00)

## 2020-11-23 MED ORDER — DENOSUMAB 60 MG/ML ~~LOC~~ SOSY
60.0000 mg | PREFILLED_SYRINGE | Freq: Once | SUBCUTANEOUS | Status: AC
  Administered 2020-11-23: 60 mg via SUBCUTANEOUS

## 2020-11-23 NOTE — Progress Notes (Signed)
Kelsey [name] is a 75 y.o. female presents to the office today for prolia injections, per physician's orders. Original order: 09/28/20 prolia (med), 60 mg/mL (dose),  IM (route) was administered left arm (location) today. Patient tolerated injection. Patient due for follow up labs/provider appt: No. Date due: n/a, appt made No Patient next injection due: n/a, appt made No  Octaviano Glow

## 2020-12-16 ENCOUNTER — Other Ambulatory Visit: Payer: Self-pay

## 2020-12-16 ENCOUNTER — Ambulatory Visit (INDEPENDENT_AMBULATORY_CARE_PROVIDER_SITE_OTHER): Payer: Medicare PPO | Admitting: Family Medicine

## 2020-12-16 ENCOUNTER — Encounter: Payer: Self-pay | Admitting: Family Medicine

## 2020-12-16 VITALS — BP 90/56 | HR 78 | Ht 65.0 in | Wt 108.0 lb

## 2020-12-16 DIAGNOSIS — M48061 Spinal stenosis, lumbar region without neurogenic claudication: Secondary | ICD-10-CM

## 2020-12-16 DIAGNOSIS — M9904 Segmental and somatic dysfunction of sacral region: Secondary | ICD-10-CM

## 2020-12-16 DIAGNOSIS — M9903 Segmental and somatic dysfunction of lumbar region: Secondary | ICD-10-CM | POA: Diagnosis not present

## 2020-12-16 DIAGNOSIS — M9902 Segmental and somatic dysfunction of thoracic region: Secondary | ICD-10-CM | POA: Diagnosis not present

## 2020-12-16 NOTE — Patient Instructions (Signed)
Always great to see you See if B12 will help toe See me again in 4-6 weeks

## 2020-12-16 NOTE — Progress Notes (Signed)
[facility] Phone: [phone] Subjective:   [name], am serving as a scribe for Dr. [name].  This visit occurred during the SARS-CoV-2 public health emergency.  Safety protocols were in place, including screening questions prior to the visit, additional usage of staff PPE, and extensive cleaning of exam room while observing appropriate contact time as indicated for disinfecting solutions.    I'm seeing this patient by the request  of:  [name], [name], DO  CC: Low back pain follow-up  [id number]  Calie ARDYS HATAWAY is a 75 y.o. female coming in with complaint of back and neck pain. OMT 11/08/2020. Patient states that with deep knee bending due to having to put on socks and pants for her husband. Does feel like she may need another epidural but would like push it off if possible.  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    Uterine fibroid 11/2012   See Dr. Cletis Media note, surgery 11/2012; calcified- multiple   Vaginitis 09/13/2011    Allergies  Allergen Reactions   Penicillins Rash    Has patient had a PCN reaction causing immediate rash, facial/tongue/throat swelling, SOB or lightheadedness with hypotension: no Has patient had a  PCN reaction causing severe rash involving mucus membranes or skin necrosis: yes Has patient had a PCN reaction that required hospitalization: no Has patient had a PCN reaction occurring within the last 10 years: no If all of the above answers are "NO", then may proceed with Cephalosporin use.    Sulfa Antibiotics Rash    Under arm and thighs     Review of Systems:  No headache, visual changes, nausea, vomiting, diarrhea, constipation, dizziness, abdominal pain, skin rash, fevers, chills, night sweats, weight loss, swollen lymph nodes, body aches, joint swelling, chest pain, shortness of breath, mood changes. POSITIVE muscle aches  Objective  Blood pressure (!) 90/56, pulse 78, height 5\' 5"  (1.651 m), weight 108 lb (49 kg), SpO2 94 %.   General: No apparent distress alert and oriented x3 mood and affect normal, dressed appropriately.  HEENT: Pupils equal, extraocular movements intact  Respiratory: Patient's speak in full sentences and does not appear short of breath  Cardiovascular: No lower extremity edema, non tender, no erythema  Continued severe loss of lordosis of the lumbar spine.  Tightness all the way up to the thoracic spine as well today.  Patient is very limited extension of the back.  Osteopathic findings  T7 extended rotated and side bent left L1 flexed rotated and side bent right L4 F RS left  Sacrum right on right  Assessment and Plan:  Degenerative lumbar spinal stenosis Chronic problem with exacerbation.  Continues to have tightness.  Recently very well due to muscle discussed posture and ergonomics.  Continue to stay fairly active.  Do feel like a possible epidural will be needed In the future.  Patient will continue to monitor it.  We will continue to monitor patient's weight as well.  Follow-up again  6 weeks   Nonallopathic problems  Decision today to treat with OMT was based on Physical Exam  After verbal consent patient was treated with  ME, FPR  techniques in  thoracic, lumbar, and sacral  areas  Patient tolerated the procedure well with improvement in symptoms  Patient given exercises, stretches and lifestyle modifications  See medications in patient instructions if given  Patient will follow up in 4-8 weeks      The above documentation has been reviewed and is accurate and complete Lyndal Pulley, DO       Note: This dictation was prepared with Dragon dictation along with smaller phrase technology. Any transcriptional errors that result from this process are unintentional.

## 2020-12-16 NOTE — Assessment & Plan Note (Signed)
Chronic problem with exacerbation.  Continues to have tightness.  Recently very well due to muscle discussed posture and ergonomics.  Continue to stay fairly active.  Do feel like a possible epidural will be needed In the future.  Patient will continue to monitor it.  We will continue to monitor patient's weight as well.  Follow-up again  6 weeks

## 2021-01-13 ENCOUNTER — Telehealth: Payer: Self-pay

## 2021-01-13 NOTE — Telephone Encounter (Signed)
A user error has taken place: encounter opened in error, closed for administrative reasons.

## 2021-01-23 ENCOUNTER — Other Ambulatory Visit: Payer: Self-pay

## 2021-01-23 ENCOUNTER — Ambulatory Visit (INDEPENDENT_AMBULATORY_CARE_PROVIDER_SITE_OTHER): Payer: Medicare PPO | Admitting: Neurology

## 2021-01-23 ENCOUNTER — Encounter: Payer: Self-pay | Admitting: Neurology

## 2021-01-23 VITALS — BP 104/64 | HR 86 | Ht 65.5 in | Wt 108.2 lb

## 2021-01-23 DIAGNOSIS — G3184 Mild cognitive impairment, so stated: Secondary | ICD-10-CM | POA: Diagnosis not present

## 2021-01-23 NOTE — Patient Instructions (Signed)
Schedule Neurocognitive testing  You have been referred for a neuropsychological evaluation (i.e., evaluation of memory and thinking abilities). Please bring someone with you to this appointment if possible, as it is helpful for the doctor to hear from both you and another adult who knows you well. Please bring eyeglasses and hearing aids if you wear them.    The evaluation will take approximately 3 hours and has two parts:   The first part is a clinical interview with the neuropsychologist (Dr. Melvyn Novas or Dr. Nicole Kindred). During the interview, the neuropsychologist will speak with you and the individual you brought to the appointment.    The second part of the evaluation is testing with the doctor's technician Hinton Dyer or Maudie Mercury). During the testing, the technician will ask you to remember different types of material, solve problems, and answer some questionnaires. Your family member will not be present for this portion of the evaluation.   Please note: We must reserve several hours of the neuropsychologist's time and the psychometrician's time for your evaluation appointment. As such, there is a No-Show fee of $100. If you are unable to attend any of your appointments, please contact our office as soon as possible to reschedule.    2. Recommend hearing evaluation  3. Follow-up after testing, call for any changes    RECOMMENDATIONS FOR ALL PATIENTS WITH MEMORY PROBLEMS: 1. Continue to exercise (Recommend 30 minutes of walking everyday, or 3 hours every week) 2. Increase social interactions - continue going to [place] and enjoy social gatherings with friends and family 3. Eat healthy, avoid fried foods and eat more fruits and vegetables 4. Maintain adequate blood pressure, blood sugar, and blood cholesterol level. Reducing the risk of stroke and cardiovascular disease also helps promoting better memory. 5. Avoid stressful situations. Live a simple life and avoid aggravations. Organize your time and  prepare for the next day in anticipation. 6. Sleep well, avoid any interruptions of sleep and avoid any distractions in the bedroom that may interfere with adequate sleep quality 7. Avoid sugar, avoid sweets as there is a strong link between excessive sugar intake, diabetes, and cognitive impairment We discussed the Mediterranean diet, which has been shown to help patients reduce the risk of progressive memory disorders and reduces cardiovascular risk. This includes eating fish, eat fruits and green leafy vegetables, nuts like almonds and hazelnuts, walnuts, and also use olive oil. Avoid fast foods and fried foods as much as possible. Avoid sweets and sugar as sugar use has been linked to worsening of memory function.       Mediterranean Diet  Why follow it? Research shows. Those who follow the Mediterranean diet have a reduced risk of heart disease  The diet is associated with a reduced incidence of Parkinson's and Alzheimer's diseases People following the diet may have longer life expectancies and lower rates of chronic diseases  The Dietary Guidelines for Americans recommends the Mediterranean diet as an eating plan to promote health and prevent disease  What Is the Mediterranean Diet?  Healthy eating plan based on typical foods and recipes of Mediterranean-style cooking The diet is primarily a plant based diet; these foods should make up a majority of meals   Starches - Plant based foods should make up a majority of meals - They are an important sources of vitamins, minerals, energy, antioxidants, and fiber - Choose whole grains, foods high in fiber and minimally processed items  - Typical grain sources include wheat, oats, barley, corn, brown rice, bulgar, farro, millet,  polenta, couscous  - Various types of beans include chickpeas, lentils, fava beans, black beans, white beans   Fruits  Veggies - Large quantities of antioxidant rich fruits & veggies; 6 or more servings  - Vegetables can  be eaten raw or lightly drizzled with oil and cooked  - Vegetables common to the traditional Mediterranean Diet include: artichokes, arugula, beets, broccoli, brussel sprouts, cabbage, carrots, celery, collard greens, cucumbers, eggplant, kale, leeks, lemons, lettuce, mushrooms, okra, onions, peas, peppers, potatoes, pumpkin, radishes, rutabaga, shallots, spinach, sweet potatoes, turnips, zucchini - Fruits common to the Mediterranean Diet include: apples, apricots, avocados, cherries, clementines, dates, figs, grapefruits, grapes, melons, nectarines, oranges, peaches, pears, pomegranates, strawberries, tangerines  Fats - Replace butter and margarine with healthy oils, such as olive oil, canola oil, and tahini  - Limit nuts to no more than a handful a day  - Nuts include walnuts, almonds, pecans, pistachios, pine nuts  - Limit or avoid candied, honey roasted or heavily salted nuts - Olives are central to the [employer] - can be eaten whole or used in a variety of dishes   Meats Protein - Limiting red meat: no more than a few times a month - When eating red meat: choose lean cuts and keep the portion to the size of deck of cards - Eggs: approx. 0 to 4 times a week  - Fish and lean poultry: at least 2 a week  - Healthy protein sources include, chicken, [place], lean beef, lamb - Increase intake of seafood such as tuna, salmon, trout, mackerel, shrimp, scallops - Avoid or limit high fat processed meats such as sausage and bacon  Dairy - Include moderate amounts of low fat dairy products  - Focus on healthy dairy such as fat free yogurt, skim milk, low or reduced fat cheese - Limit dairy products higher in fat such as whole or 2% milk, cheese, ice cream  Alcohol - Moderate amounts of red wine is ok  - No more than 5 oz daily for women (all ages) and men older than age 62  - No more than 10 oz of wine daily for men younger than 5  Other - Limit sweets and other desserts  - Use herbs and  spices instead of salt to flavor foods  - Herbs and spices common to the traditional Mediterranean Diet include: basil, bay leaves, chives, cloves, cumin, fennel, garlic, lavender, marjoram, mint, oregano, parsley, pepper, rosemary, sage, savory, sumac, tarragon, thyme   It's not just a diet, it's a lifestyle:  The Mediterranean diet includes lifestyle factors typical of those in the region  Foods, drinks and meals are best eaten with others and savored Daily physical activity is important for overall good health This could be strenuous exercise like running and aerobics This could also be more leisurely activities such as walking, housework, yard-work, or taking the stairs Moderation is the key; a balanced and healthy diet accommodates most foods and drinks Consider portion sizes and frequency of consumption of certain foods   Meal Ideas & Options:  Breakfast:  Whole wheat toast or whole wheat English muffins with peanut butter & hard boiled egg Steel cut oats topped with apples & cinnamon and skim milk  Fresh fruit: banana, strawberries, melon, berries, peaches  Smoothies: strawberries, bananas, greek yogurt, peanut butter Low fat greek yogurt with blueberries and granola  Egg white omelet with spinach and mushrooms Breakfast couscous: whole wheat couscous, apricots, skim milk, cranberries  Sandwiches:  Hummus and grilled vegetables (peppers, zucchini,  squash) on whole wheat bread   Grilled chicken on whole wheat pita with lettuce, tomatoes, cucumbers or tzatziki  [place] salad on whole wheat bread: tuna salad made with greek yogurt, olives, red peppers, capers, green onions Garlic rosemary lamb pita: lamb sauted with garlic, rosemary, salt & pepper; add lettuce, cucumber, greek yogurt to pita - flavor with lemon juice and black pepper  Seafood:  Mediterranean grilled salmon, seasoned with garlic, basil, parsley, lemon juice and black pepper Shrimp, lemon, and spinach whole-grain pasta  salad made with low fat greek yogurt  Seared scallops with lemon orzo  Seared tuna steaks seasoned salt, pepper, coriander topped with tomato mixture of olives, tomatoes, olive oil, minced garlic, parsley, green onions and cappers  Meats:  Herbed greek chicken salad with kalamata olives, cucumber, feta  Red bell peppers stuffed with spinach, bulgur, lean ground beef (or lentils) & topped with feta   Kebabs: skewers of chicken, tomatoes, onions, zucchini, squash  [place] burgers: made with red onions, mint, dill, lemon juice, feta cheese topped with roasted red peppers Vegetarian Cucumber salad: cucumbers, artichoke hearts, celery, red onion, feta cheese, tossed in olive oil & lemon juice  Hummus and whole grain pita points with a greek salad (lettuce, tomato, feta, olives, cucumbers, red onion) Lentil soup with celery, carrots made with vegetable broth, garlic, salt and pepper  Tabouli salad: parsley, bulgur, mint, scallions, cucumbers, tomato, radishes, lemon juice, olive oil, salt and pepper.

## 2021-01-23 NOTE — Progress Notes (Signed)
NEUROLOGY CONSULTATION NOTE  Kelsey [name] MRN: [id number] DOB: 1945-08-18  Referring provider: Dr. Howard Pouch Primary care provider: Dr. Howard Pouch  Reason for consult:  memory loss  Dear Dr [name]:  Thank you for your kind referral of Kelsey [name] for consultation of the above symptoms. Although her history is well known to you, please allow me to reiterate it for the purpose of our medical record. She is alone in the office today. Records and images were personally reviewed where available.   HISTORY OF PRESENT ILLNESS: This is a pleasant 75 year old right-handed woman with a history of hyperlipidemia, vitamin D and B deficiency, presenting for evaluation of memory loss. She started noticing gradual changes over the past 3 years but has been concerned that symptoms are progressively worsening. She lives with her husband who has late-stage dementia and has been his primary caregiver. She finally got someone today to help hopefully more regularly. She continues to drive and denies getting lost. She has noticed she has lost her sense of direction and depends on her GPS a lot. She has missed a few bills a few months ago. She manages her husband medications and thinks she does pretty well with his, but forgets hers. She has a pillbox but forgets where she puts the pillbox. She has burned food on the stove a few times. She is concerned about an incident last June where she signed up for a memory fitness class. When she received the course textbook, she realized she had already taken this course before the pandemic but forgot about it. She misplaces things frequently and states she wastes so much unproductive time with so much moving around looking for things. Sometimes what she is looking for is right in front of her but she does not see it. She dresses and bathes her husband and says she is always rushing, the mobility bus for him comes at random times. She states she does not feel  stressed out but realizes her situation is stressful. They have 4 children between the two of them who live out of state. Her son has mentioned she repeats herself and does not recall prior conversations. She denies any personality changes, no paranoia or hallucinations.  She has chronic low back pain. Occasionally her hands lock up and she has to pull on her fingers. She denies any headaches, dizziness, diplopia, dysarthria/dysphagia, neck pain, focal numbness/tingling/weakness, anosmia, or tremors. Sleep is good. No falls. Her mother and maternal grandfather had dementia. She denies any head injuries. She occasionally drink alcohol around twice a week.   I personally reviewed MRI brain with and without contrast done 10/2020 which did not show any acute changes. There was minimal chronic microvascular disease.  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    Uterine fibroid 11/2012   See Dr. Cletis Media note, surgery 11/2012; calcified- multiple   Vaginitis 09/13/2011    PAST SURGICAL HISTORY: Past Surgical History:  Procedure Laterality Date   DILATATION & CURRETTAGE/HYSTEROSCOPY WITH RESECTOCOPE N/A 11/06/2012    Procedure: [place];  Surgeon: Alwyn Pea, MD;  Location: [facility];  Service: Gynecology;  Laterality: N/A;   HERNIA REPAIR  1992   small intestine hernia  1994   WISDOM TOOTH EXTRACTION      MEDICATIONS: Current Outpatient Medications on File Prior to Visit  Medication Sig Dispense Refill   atorvastatin (LIPITOR) 10 MG tablet Take 1 tablet (10 mg total) by mouth every evening. 90 tablet 3   Cyanocobalamin (VITAMIN B-12) 500 MCG SUBL 1 tab placed under the tongue daily (Patient taking differently: 625 mcg. 1 tab placed under the tongue daily; 5000 mcg cut into 8th) 90 tablet 3   famotidine (PEPCID) 20 MG tablet TAKE 1 TABLET(20 MG) BY MOUTH TWICE DAILY 60 tablet 5   omeprazole (PRILOSEC) 40 MG capsule Take 1 capsule (40 mg total) by mouth daily. 90 capsule 1   sucralfate (CARAFATE) 1 g tablet TAKE 1 TABLET(1 GRAM) BY MOUTH FOUR TIMES DAILY AT BEDTIME WITH MEALS 120 tablet 5   Vitamin D, Ergocalciferol, (DRISDOL) 1.25 MG (50000 UNIT) CAPS capsule Take 1 capsule (50,000 Units total) by mouth every 7 (seven) days. 12 capsule 3   No current facility-administered medications on file prior to visit.    ALLERGIES: Allergies  Allergen Reactions   Penicillins Rash    Has patient had a PCN reaction causing immediate rash, facial/tongue/throat swelling, SOB or lightheadedness with hypotension: no Has patient had a PCN reaction causing severe rash involving mucus membranes or skin necrosis: yes Has patient had a PCN reaction that required hospitalization: no Has patient had a PCN reaction occurring within the last 10 years: no If all of the above answers are "NO", then may proceed with Cephalosporin use.    Sulfa Antibiotics Rash    Under arm and thighs    FAMILY HISTORY: Family History  Problem Relation Age of Onset   Heart disease Mother    Osteoporosis Mother    Cancer Father        prostate   Osteoporosis Sister    Gout Brother    Gout  Maternal Grandmother    Heart disease Maternal Grandmother    Alzheimer's disease Maternal Grandfather    Osteoporosis Sister     SOCIAL HISTORY: Social History   Socioeconomic History   Marital status: Married    Spouse name: Not on file   Number of children: Not on file   Years of education: Not on file   Highest education level: Not on file  Occupational History   Not on file  Tobacco Use   Smoking status: Former    Packs/day: 0.30    Years: 10.00    Pack years: 3.00    Types: Cigarettes    Quit date: 07/09/1978    Years since quitting: 42.5   Smokeless tobacco: Never  Vaping Use   Vaping Use: Never used  Substance and Sexual Activity   Alcohol use: Yes    Comment: 1-2 glasses of wine daily and a 12 oz  beer daily   Drug use: No   Sexual activity: Never  Other Topics Concern   Not on file  Social History Narrative   Not on file   Social Determinants of Health   Financial Resource Strain: Not on file  Food Insecurity: Not on file  Transportation Needs: No Transportation Needs   Lack of Transportation (Medical): No   Lack of Transportation (Non-Medical): No  Physical Activity: Sufficiently Active   Days of Exercise per Week: 4 days   Minutes of Exercise per Session: 60 min  Stress: Not on file  Social Connections: Not on file  Intimate Partner Violence: Not At Risk   Fear of Current or Ex-Partner: No   Emotionally Abused: No   Physically Abused: No   Sexually Abused: No     PHYSICAL EXAM: Vitals:   01/23/21 1035  BP: 104/64  Pulse: 86  SpO2: 95%   General: No acute distress Head:  Normocephalic/atraumatic Skin/Extremities: No rash, no edema Neurological Exam: Mental status: alert and oriented to person, place, and time, no dysarthria or aphasia, Fund of knowledge is appropriate.  Recent and remote memory are intact.  Attention and concentration are normal.    Able to name objects, difficulty with repetition (which may be due to language difference  rather than cognition). MOCA 28/30. Montreal Cognitive Assessment  01/23/2021  Visuospatial/ Executive (0/5) 5  Naming (0/3) 3  Attention: Read list of digits (0/2) 2  Attention: Read list of letters (0/1) 1  Attention: Serial 7 subtraction starting at 100 (0/3) 3  Language: Repeat phrase (0/2) 0  Language : Fluency (0/1) 1  Abstraction (0/2) 2  Delayed Recall (0/5) 5  Orientation (0/6) 6  Total 28  Adjusted Score (based on education) 28    Cranial nerves: CN I: not tested CN II: pupils equal, round and reactive to light, visual fields intact CN III, IV, VI:  full range of motion, no nystagmus, no ptosis CN V: facial sensation intact CN VII: upper and lower face symmetric CN VIII: hearing intact to conversation CN IX, X: gag intact, uvula midline CN XI: sternocleidomastoid and trapezius muscles intact CN XII: tongue midline Bulk & Tone: normal, no fasciculations. Motor: 5/5 throughout with no pronator drift. Sensation: intact to light touch, cold, pin, vibration sense.  No extinction to double simultaneous stimulation.  Romberg test negative Deep Tendon Reflexes: +2 throughout Cerebellar: no incoordination on finger to nose testing Gait: narrow-based and steady, able to tandem walk adequately. Tremor: none   IMPRESSION: This is a pleasant 75 year old right-handed woman with a history of hyperlipidemia, vitamin D and B deficiency, presenting for evaluation of memory loss. Her neurological exam is normal, MOCA score today normal 28/30. We reviewed brain MRI which did not show any acute changes, there is minimal chronic microvascular disease. She is reporting increasing difficulties with complex tasks but overall functions fine being her husband's primary caregiver. We discussed concern for Mild Cognitive Impairment. We discussed different causes of memory loss, I do wonder about the stress of being primary caregiver weighing on her, she states she worries about him if something  happens to her. We discussed doing Neurocognitive testing to further evaluate cognitive concerns. Hearing evaluation also recommended. We also discussed long-term planning for her and her husband. We discussed the importance of control of vascular risk factors, physical exercise, and brain stimulation exercises for overall brain health. Follow-up after testing, she knows to call for any changes.    Thank you for allowing me  to participate in the care of this patient. Please do not hesitate to call for any questions or concerns.   Kelsey [name], M.D.  CC: Dr. [name]

## 2021-01-24 NOTE — Progress Notes (Signed)
[place] Phone: [phone] Subjective:   [name], am serving as a scribe for Dr. [name].  This visit occurred during the SARS-CoV-2 public health emergency.  Safety protocols were in place, including screening questions prior to the visit, additional usage of staff PPE, and extensive cleaning of exam room while observing appropriate contact time as indicated for disinfecting solutions.    I'm seeing this patient by the request  of:  [name], [name], DO  CC: Low back pain follow-up  [id number]  Shardea NORISSA BARTEE is a 75 y.o. female coming in with complaint of back and neck pain. OMT 12/16/2020. Patient states that she swam today and her back pain is alleviated. Patient did have increase in pain in lower back.   Also complaining of B trigger finger in ring fingers. R>L. Patient has not had triggering this week but on some days she will have triggering 3x a day.   Medications patient has been prescribed: None          Reviewed prior external information including notes and imaging from previsou exam, outside providers and external EMR if available.   As well as notes that were available from care everywhere and other healthcare systems.  Past medical history, social, surgical and family history all reviewed in electronic medical record.  No pertanent information unless stated regarding to the chief complaint.   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    Uterine fibroid 11/2012   See Dr. Cletis Media note, surgery 11/2012; calcified- multiple   Vaginitis 09/13/2011    Allergies  Allergen Reactions   Penicillins Rash    Has patient had a PCN reaction causing immediate rash, facial/tongue/throat swelling, SOB or lightheadedness with hypotension: no Has patient had a PCN reaction causing severe rash involving mucus membranes or skin necrosis: yes Has patient had a PCN reaction that required hospitalization: no Has patient had a PCN reaction occurring within the last 10 years: no If all of the above answers are "NO", then may proceed with Cephalosporin use.    Sulfa Antibiotics Rash    Under arm and thighs     Review of Systems:  No headache, visual changes, nausea, vomiting, diarrhea, constipation, dizziness, abdominal pain, skin rash, fevers, chills, night sweats, weight loss, swollen lymph nodes, joint swelling, chest pain, shortness of breath, mood changes. POSITIVE muscle aches, body aches  Objective  Blood pressure (!) 90/58, pulse 74, height 5' 5.5" (1.664 m), weight 107 lb (48.5 kg), SpO2 96 %.   General: No apparent distress alert and oriented x3 mood and affect normal, dressed appropriately.  HEENT: Pupils equal, extraocular movements intact  Respiratory: Patient's speak in full sentences and  does not appear short of breath  Cardiovascular: No lower extremity edema, non tender, no erythema  Gait normal with good balance and coordination.  MSK: Hand exam bilaterally shows that patient does have trigger nodules noted at the A2 pulley bilaterally. Back -patient does have loss of lordosis.  Limited range of motion in all planes.  Decent core strength are noted.  Patient does have worsening pain with any extension of the back.  Some increase in tightness of the  FABER test bilaterally.  Osteopathic findings C7 flexed rotated and side bent left T6 extended rotated and side bent left L2 flexed rotated and side bent right Sacrum right on right       Assessment and Plan:  Degenerative lumbar spinal stenosis Significant arthritic changes.  Chronic problem with mild exacerbation.  Discussed the vitamin D supplementation still.  Discussed with patient about icing regimen and home exercises.  Has responded fairly well though to osteopathic manipulation.  Continue the conservative therapy otherwise.  Follow-up again in 6 to 8 weeks  Trigger finger, acquired Bilateral.  Seems to be more of the ring fingers.  We will try bracing and massage.  No worsening symptoms consider injection at follow-up   Nonallopathic problems  Decision today to treat with OMT was based on Physical Exam  After verbal consent patient was treated with HVLA, ME, FPR techniques in cervical, thoracic, lumbar, and sacral  areas  Patient tolerated the procedure well with improvement in symptoms  Patient given exercises, stretches and lifestyle modifications  See medications in patient instructions if given  Patient will follow up in 4-8 weeks      The above documentation has been reviewed and is accurate and complete Lyndal Pulley, DO        Note: This dictation was prepared with Dragon dictation along with smaller phrase technology. Any transcriptional errors that result from this process are unintentional.

## 2021-01-25 ENCOUNTER — Ambulatory Visit (INDEPENDENT_AMBULATORY_CARE_PROVIDER_SITE_OTHER): Payer: Medicare PPO | Admitting: Family Medicine

## 2021-01-25 ENCOUNTER — Other Ambulatory Visit: Payer: Self-pay

## 2021-01-25 ENCOUNTER — Encounter: Payer: Self-pay | Admitting: Family Medicine

## 2021-01-25 VITALS — BP 90/58 | HR 74 | Ht 65.5 in | Wt 107.0 lb

## 2021-01-25 DIAGNOSIS — M6530 Trigger finger, unspecified finger: Secondary | ICD-10-CM | POA: Diagnosis not present

## 2021-01-25 DIAGNOSIS — M9903 Segmental and somatic dysfunction of lumbar region: Secondary | ICD-10-CM | POA: Diagnosis not present

## 2021-01-25 DIAGNOSIS — M48061 Spinal stenosis, lumbar region without neurogenic claudication: Secondary | ICD-10-CM

## 2021-01-25 DIAGNOSIS — M9901 Segmental and somatic dysfunction of cervical region: Secondary | ICD-10-CM | POA: Diagnosis not present

## 2021-01-25 DIAGNOSIS — M9902 Segmental and somatic dysfunction of thoracic region: Secondary | ICD-10-CM

## 2021-01-25 DIAGNOSIS — M9904 Segmental and somatic dysfunction of sacral region: Secondary | ICD-10-CM

## 2021-01-25 HISTORY — DX: Trigger finger, unspecified finger: M65.30

## 2021-01-25 NOTE — Assessment & Plan Note (Signed)
Significant arthritic changes.  Chronic problem with mild exacerbation.  Discussed the vitamin D supplementation still.  Discussed with patient about icing regimen and home exercises.  Has responded fairly well though to osteopathic manipulation.  Continue the conservative therapy otherwise.  Follow-up again in 6 to 8 weeks

## 2021-01-25 NOTE — Patient Instructions (Signed)
Braces at night Warm compress/Massage area for 10 minutes each day See me again in 4 weeks

## 2021-01-25 NOTE — Assessment & Plan Note (Signed)
Bilateral.  Seems to be more of the ring fingers.  We will try bracing and massage.  No worsening symptoms consider injection at follow-up

## 2021-02-03 ENCOUNTER — Ambulatory Visit
Admission: RE | Admit: 2021-02-03 | Discharge: 2021-02-03 | Disposition: A | Discharge status: Home / Self Care | Payer: Medicare PPO | Source: Ambulatory Visit | Attending: Family Medicine | Admitting: Family Medicine

## 2021-02-03 ENCOUNTER — Other Ambulatory Visit: Payer: Self-pay

## 2021-02-03 DIAGNOSIS — M810 Age-related osteoporosis without current pathological fracture: Secondary | ICD-10-CM | POA: Diagnosis not present

## 2021-02-03 DIAGNOSIS — Z780 Asymptomatic menopausal state: Secondary | ICD-10-CM

## 2021-02-22 ENCOUNTER — Ambulatory Visit (INDEPENDENT_AMBULATORY_CARE_PROVIDER_SITE_OTHER): Payer: Medicare PPO | Admitting: Family Medicine

## 2021-02-22 ENCOUNTER — Other Ambulatory Visit: Payer: Self-pay

## 2021-02-22 ENCOUNTER — Encounter: Payer: Self-pay | Admitting: Family Medicine

## 2021-02-22 VITALS — BP 90/60 | HR 81 | Ht 65.5 in | Wt 106.0 lb

## 2021-02-22 DIAGNOSIS — M9904 Segmental and somatic dysfunction of sacral region: Secondary | ICD-10-CM | POA: Diagnosis not present

## 2021-02-22 DIAGNOSIS — M48061 Spinal stenosis, lumbar region without neurogenic claudication: Secondary | ICD-10-CM

## 2021-02-22 DIAGNOSIS — M9903 Segmental and somatic dysfunction of lumbar region: Secondary | ICD-10-CM

## 2021-02-22 DIAGNOSIS — M9902 Segmental and somatic dysfunction of thoracic region: Secondary | ICD-10-CM | POA: Diagnosis not present

## 2021-02-22 NOTE — Progress Notes (Signed)
[place] Phone: [phone] Subjective:   Kelsey [name], am serving as a scribe for Dr. [name]. This visit occurred during the SARS-CoV-2 public health emergency.  Safety protocols were in place, including screening questions prior to the visit, additional usage of staff PPE, and extensive cleaning of exam room while observing appropriate contact time as indicated for disinfecting solutions.   I'm seeing this patient by the request  of:  [name], [name], DO  CC: Low back pain follow-up  [id number]  Kelsey [name] is a 75 y.o. female coming in with complaint of back and neck pain. OMT 01/25/2021. Patient states experiencing tightness.  Patient has been in the hospital secondary to the admission of her husband.  Has been sleeping in the chair as well as temperatures.  Notices that causes her back to be tighter.  [name] significant radiation of the pain.             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    Uterine fibroid 11/2012   See Dr. Cletis Media note, surgery 11/2012; calcified- multiple   Vaginitis 09/13/2011    Allergies  Allergen Reactions   Penicillins Rash    Has patient had a PCN reaction causing immediate rash, facial/tongue/throat swelling, SOB or lightheadedness with  hypotension: [name] Has patient had a PCN reaction causing severe rash involving mucus membranes or skin necrosis: yes Has patient had a PCN reaction that required hospitalization: [name] Has patient had a PCN reaction occurring within the last 10 years: [name] If all of the above answers are "[name]", then may proceed with Cephalosporin use.    Sulfa Antibiotics Rash    Under arm and thighs     Review of Systems:  [name] headache, visual changes, nausea, vomiting, diarrhea, constipation, dizziness, abdominal pain, skin rash, fevers, chills, night sweats, weight loss, swollen lymph nodes, joint swelling, chest pain, shortness of breath, mood changes. POSITIVE muscle aches, body aches  Objective  Blood pressure 90/60, pulse 81, height 5' 5.5" (1.664 m), weight 106 lb (48.1 kg), SpO2 96 %.   General: [name] apparent distress alert and oriented x3 mood and affect normal, dressed appropriately.  HEENT: Pupils equal, extraocular movements intact  Respiratory: Patient's speak in full sentences and does not appear short of breath  Cardiovascular: [name] lower extremity edema, non tender, [name] erythema  Patient back exam does show that patient does have decreased range of motion of the Patient's regular baseline.  Patient does have tenderness noted.  Patient does have tightness with FABER test which is different than patient's baseline as well.  Negative straight leg test.  Osteopathic findings  T9 extended rotated and side bent left L2 flexed rotated  and side bent right L5 flexed rotated left side bent left Sacrum right on right       Assessment and Plan: Degenerative lumbar spinal stenosis SignificantDegenerative changes of the lumbar spinal stenosis.  Discussed with patient about icing regimen and home exercises.  Patient does have spinal stenosis and we will continue to monitor.  Worsening will need to consider the possibility of another epidural.  Patient is taking care of her ailing husband that likely  contributing to some increasing discomfort with her not being able to take care of her self.  Follow-up with me again in 4 to 6 weeks    Nonallopathic problems  Decision today to treat with OMT was based on Physical Exam  After verbal consent patient was treated with HVLA, ME, FPR techniques in  thoracic, lumbar, and sacral  areas  Patient tolerated the procedure well with improvement in symptoms  Patient given exercises, stretches and lifestyle modifications  See medications in patient instructions if given  Patient will follow up in 4-6 weeks      The above documentation has been reviewed and is accurate and complete Lyndal Pulley, DO       Note: This dictation was prepared with Dragon dictation along with smaller phrase technology. Any transcriptional errors that result from this process are unintentional.

## 2021-02-22 NOTE — Patient Instructions (Signed)
Take care of yourself See me in 4 weeks

## 2021-02-22 NOTE — Assessment & Plan Note (Signed)
SignificantDegenerative changes of the lumbar spinal stenosis.  Discussed with patient about icing regimen and home exercises.  Patient does have spinal stenosis and we will continue to monitor.  Worsening will need to consider the possibility of another epidural.  Patient is taking care of her ailing husband that likely contributing to some increasing discomfort with her not being able to take care of her self.  Follow-up with me again in 4 to 6 weeks

## 2021-03-07 ENCOUNTER — Other Ambulatory Visit: Payer: Self-pay

## 2021-03-07 ENCOUNTER — Encounter (HOSPITAL_BASED_OUTPATIENT_CLINIC_OR_DEPARTMENT_OTHER): Payer: Self-pay | Admitting: *Deleted

## 2021-03-07 ENCOUNTER — Emergency Department (HOSPITAL_BASED_OUTPATIENT_CLINIC_OR_DEPARTMENT_OTHER)
Admission: EM | Admit: 2021-03-07 | Discharge: 2021-03-08 | Disposition: A | Discharge status: Home / Self Care | Payer: Medicare PPO | Source: Home / Self Care | Attending: Emergency Medicine | Admitting: Emergency Medicine

## 2021-03-07 DIAGNOSIS — Z23 Encounter for immunization: Secondary | ICD-10-CM | POA: Diagnosis not present

## 2021-03-07 DIAGNOSIS — W5501XA Bitten by cat, initial encounter: Secondary | ICD-10-CM

## 2021-03-07 DIAGNOSIS — S61432A Puncture wound without foreign body of left hand, initial encounter: Secondary | ICD-10-CM | POA: Insufficient documentation

## 2021-03-07 DIAGNOSIS — Z79899 Other long term (current) drug therapy: Secondary | ICD-10-CM | POA: Diagnosis not present

## 2021-03-07 DIAGNOSIS — Z87891 Personal history of nicotine dependence: Secondary | ICD-10-CM | POA: Insufficient documentation

## 2021-03-07 DIAGNOSIS — Z8619 Personal history of other infectious and parasitic diseases: Secondary | ICD-10-CM | POA: Diagnosis not present

## 2021-03-07 DIAGNOSIS — E785 Hyperlipidemia, unspecified: Secondary | ICD-10-CM | POA: Diagnosis not present

## 2021-03-07 DIAGNOSIS — Z882 Allergy status to sulfonamides status: Secondary | ICD-10-CM | POA: Diagnosis not present

## 2021-03-07 DIAGNOSIS — Z880 Allergy status to penicillin: Secondary | ICD-10-CM | POA: Diagnosis not present

## 2021-03-07 DIAGNOSIS — S61452A Open bite of left hand, initial encounter: Secondary | ICD-10-CM | POA: Diagnosis not present

## 2021-03-07 DIAGNOSIS — Z8719 Personal history of other diseases of the digestive system: Secondary | ICD-10-CM | POA: Diagnosis not present

## 2021-03-07 NOTE — ED Triage Notes (Signed)
C/o left thumb cat bite x 1 hr ago, UTD on vacc

## 2021-03-08 ENCOUNTER — Emergency Department (HOSPITAL_BASED_OUTPATIENT_CLINIC_OR_DEPARTMENT_OTHER): Payer: Medicare PPO

## 2021-03-08 ENCOUNTER — Encounter (HOSPITAL_BASED_OUTPATIENT_CLINIC_OR_DEPARTMENT_OTHER): Payer: Self-pay | Admitting: Emergency Medicine

## 2021-03-08 ENCOUNTER — Ambulatory Visit (HOSPITAL_COMMUNITY): Payer: Medicare PPO | Admitting: Anesthesiology

## 2021-03-08 ENCOUNTER — Encounter (HOSPITAL_COMMUNITY)
Admission: RE | Disposition: A | Discharge status: Home / Self Care | Payer: Self-pay | Source: Home / Self Care | Attending: Orthopedic Surgery

## 2021-03-08 ENCOUNTER — Encounter (HOSPITAL_COMMUNITY): Payer: Self-pay | Admitting: Orthopedic Surgery

## 2021-03-08 ENCOUNTER — Telehealth: Payer: Self-pay

## 2021-03-08 ENCOUNTER — Other Ambulatory Visit: Payer: Self-pay | Admitting: Orthopedic Surgery

## 2021-03-08 ENCOUNTER — Ambulatory Visit (HOSPITAL_COMMUNITY)
Admission: RE | Admit: 2021-03-08 | Discharge: 2021-03-08 | Disposition: A | Discharge status: Home / Self Care | Payer: Medicare PPO | Attending: Orthopedic Surgery | Admitting: Orthopedic Surgery

## 2021-03-08 DIAGNOSIS — Z882 Allergy status to sulfonamides status: Secondary | ICD-10-CM | POA: Diagnosis not present

## 2021-03-08 DIAGNOSIS — S61452A Open bite of left hand, initial encounter: Secondary | ICD-10-CM | POA: Diagnosis not present

## 2021-03-08 DIAGNOSIS — E785 Hyperlipidemia, unspecified: Secondary | ICD-10-CM | POA: Diagnosis not present

## 2021-03-08 DIAGNOSIS — Z87891 Personal history of nicotine dependence: Secondary | ICD-10-CM | POA: Insufficient documentation

## 2021-03-08 DIAGNOSIS — Z8619 Personal history of other infectious and parasitic diseases: Secondary | ICD-10-CM | POA: Diagnosis not present

## 2021-03-08 DIAGNOSIS — W5501XA Bitten by cat, initial encounter: Secondary | ICD-10-CM | POA: Diagnosis not present

## 2021-03-08 DIAGNOSIS — Z880 Allergy status to penicillin: Secondary | ICD-10-CM | POA: Insufficient documentation

## 2021-03-08 DIAGNOSIS — Z79899 Other long term (current) drug therapy: Secondary | ICD-10-CM | POA: Insufficient documentation

## 2021-03-08 DIAGNOSIS — S61052A Open bite of left thumb without damage to nail, initial encounter: Secondary | ICD-10-CM | POA: Diagnosis not present

## 2021-03-08 DIAGNOSIS — L089 Local infection of the skin and subcutaneous tissue, unspecified: Secondary | ICD-10-CM | POA: Insufficient documentation

## 2021-03-08 DIAGNOSIS — K219 Gastro-esophageal reflux disease without esophagitis: Secondary | ICD-10-CM | POA: Diagnosis not present

## 2021-03-08 DIAGNOSIS — Z23 Encounter for immunization: Secondary | ICD-10-CM | POA: Diagnosis not present

## 2021-03-08 DIAGNOSIS — Z8719 Personal history of other diseases of the digestive system: Secondary | ICD-10-CM | POA: Insufficient documentation

## 2021-03-08 DIAGNOSIS — M654 Radial styloid tenosynovitis [de Quervain]: Secondary | ICD-10-CM | POA: Diagnosis not present

## 2021-03-08 HISTORY — PX: I & D EXTREMITY: SHX5045

## 2021-03-08 LAB — CBC
HCT: 38.6 % (ref 36.0–46.0)
Hemoglobin: 12.4 g/dL (ref 12.0–15.0)
MCH: 31.3 pg (ref 26.0–34.0)
MCHC: 32.1 g/dL (ref 30.0–36.0)
MCV: 97.5 fL (ref 80.0–100.0)
Platelets: 233 10*3/uL (ref 150–400)
RBC: 3.96 MIL/uL (ref 3.87–5.11)
RDW: 13.5 % (ref 11.5–15.5)
WBC: 8.3 10*3/uL (ref 4.0–10.5)
nRBC: 0 % (ref 0.0–0.2)

## 2021-03-08 SURGERY — IRRIGATION AND DEBRIDEMENT EXTREMITY
Anesthesia: Choice | Laterality: Left

## 2021-03-08 MED ORDER — ORAL CARE MOUTH RINSE: 15.0000 mL | Freq: Once | OROMUCOSAL | Status: AC

## 2021-03-08 MED ORDER — ACETAMINOPHEN 500 MG PO TABS
1000.0000 mg | ORAL_TABLET | Freq: Once | ORAL | Status: AC
  Administered 2021-03-08: 1000 mg via ORAL
  Filled 2021-03-08: qty 2

## 2021-03-08 MED ORDER — METRONIDAZOLE 500 MG/100ML IV SOLN
500.0000 mg | INTRAVENOUS | Status: AC
  Administered 2021-03-08: 500 mg via INTRAVENOUS
  Filled 2021-03-08: qty 100

## 2021-03-08 MED ORDER — DIPHENHYDRAMINE HCL 50 MG/ML IJ SOLN
INTRAMUSCULAR | Status: AC
  Administered 2021-03-08: 12.5 mg
  Filled 2021-03-08: qty 1

## 2021-03-08 MED ORDER — FENTANYL CITRATE (PF) 100 MCG/2ML IJ SOLN
INTRAMUSCULAR | Status: AC
  Administered 2021-03-08: 50 ug via INTRAVENOUS
  Filled 2021-03-08: qty 2

## 2021-03-08 MED ORDER — MIDAZOLAM HCL 2 MG/2ML IJ SOLN
INTRAMUSCULAR | Status: AC
  Filled 2021-03-08: qty 2

## 2021-03-08 MED ORDER — LIDOCAINE 2% (20 MG/ML) 5 ML SYRINGE
INTRAMUSCULAR | Status: AC
  Filled 2021-03-08: qty 5

## 2021-03-08 MED ORDER — LEVOFLOXACIN IN D5W 500 MG/100ML IV SOLN
500.0000 mg | INTRAVENOUS | Status: AC
  Administered 2021-03-08: 500 mg via INTRAVENOUS
  Filled 2021-03-08: qty 100

## 2021-03-08 MED ORDER — LIDOCAINE 2% (20 MG/ML) 5 ML SYRINGE
INTRAMUSCULAR | Status: DC | PRN
  Administered 2021-03-08: 20 mg via INTRAVENOUS

## 2021-03-08 MED ORDER — PROPOFOL 500 MG/50ML IV EMUL
INTRAVENOUS | Status: DC | PRN
  Administered 2021-03-08: 75 ug/kg/min via INTRAVENOUS

## 2021-03-08 MED ORDER — ALIGN EXTRA STRENGTH PO CAPS: 1.0000 | ORAL_CAPSULE | Freq: Four times a day (QID) | ORAL | 0 refills | Status: DC

## 2021-03-08 MED ORDER — PROPOFOL 10 MG/ML IV BOLUS
INTRAVENOUS | Status: AC
  Filled 2021-03-08: qty 20

## 2021-03-08 MED ORDER — FENTANYL CITRATE (PF) 100 MCG/2ML IJ SOLN: 50.0000 ug | Freq: Once | INTRAMUSCULAR | Status: AC

## 2021-03-08 MED ORDER — FENTANYL CITRATE (PF) 250 MCG/5ML IJ SOLN
INTRAMUSCULAR | Status: AC
  Filled 2021-03-08: qty 5

## 2021-03-08 MED ORDER — FENTANYL CITRATE (PF) 100 MCG/2ML IJ SOLN
25.0000 ug | INTRAMUSCULAR | Status: DC | PRN
Start: 2021-03-08 — End: 2021-03-09

## 2021-03-08 MED ORDER — METRONIDAZOLE 500 MG PO TABS: 500.0000 mg | ORAL_TABLET | Freq: Two times a day (BID) | ORAL | 0 refills | Status: DC

## 2021-03-08 MED ORDER — ROPIVACAINE HCL 7.5 MG/ML IJ SOLN
INTRAMUSCULAR | Status: DC | PRN
  Administered 2021-03-08: 20 mL via PERINEURAL

## 2021-03-08 MED ORDER — AMISULPRIDE (ANTIEMETIC) 5 MG/2ML IV SOLN: 10.0000 mg | Freq: Once | INTRAVENOUS | Status: DC | PRN

## 2021-03-08 MED ORDER — SODIUM CHLORIDE 0.9 % IR SOLN
Status: DC | PRN
  Administered 2021-03-08: 3000 mL

## 2021-03-08 MED ORDER — HYDROCODONE-ACETAMINOPHEN 5-325 MG PO TABS: ORAL_TABLET | ORAL | 0 refills | Status: DC

## 2021-03-08 MED ORDER — 0.9 % SODIUM CHLORIDE (POUR BTL) OPTIME
TOPICAL | Status: DC | PRN
  Administered 2021-03-08: 1000 mL

## 2021-03-08 MED ORDER — LEVOFLOXACIN 500 MG PO TABS
500.0000 mg | ORAL_TABLET | Freq: Every day | ORAL | 0 refills | Status: DC
Start: 2021-03-08 — End: 2021-05-08

## 2021-03-08 MED ORDER — MUPIROCIN CALCIUM 2 % EX CREA: 1.0000 "application " | TOPICAL_CREAM | Freq: Two times a day (BID) | CUTANEOUS | 0 refills | Status: DC

## 2021-03-08 MED ORDER — TETANUS-DIPHTH-ACELL PERTUSSIS 5-2.5-18.5 LF-MCG/0.5 IM SUSY
0.5000 mL | PREFILLED_SYRINGE | Freq: Once | INTRAMUSCULAR | Status: AC
  Administered 2021-03-08: 0.5 mL via INTRAMUSCULAR
  Filled 2021-03-08: qty 0.5

## 2021-03-08 MED ORDER — METRONIDAZOLE 500 MG PO TABS
500.0000 mg | ORAL_TABLET | Freq: Once | ORAL | Status: AC
  Administered 2021-03-08: 500 mg via ORAL
  Filled 2021-03-08: qty 1

## 2021-03-08 MED ORDER — LEVOFLOXACIN 500 MG PO TABS
500.0000 mg | ORAL_TABLET | Freq: Once | ORAL | Status: AC
  Administered 2021-03-08: 500 mg via ORAL
  Filled 2021-03-08: qty 1

## 2021-03-08 MED ORDER — CHLORHEXIDINE GLUCONATE 0.12 % MT SOLN: 15.0000 mL | Freq: Once | OROMUCOSAL | Status: AC

## 2021-03-08 MED ORDER — CHLORHEXIDINE GLUCONATE 0.12 % MT SOLN
OROMUCOSAL | Status: AC
  Administered 2021-03-08: 15 mL via OROMUCOSAL
  Filled 2021-03-08: qty 15

## 2021-03-08 MED ORDER — BUPIVACAINE HCL (PF) 0.25 % IJ SOLN
INTRAMUSCULAR | Status: AC
  Filled 2021-03-08: qty 30

## 2021-03-08 MED ORDER — DEXAMETHASONE SODIUM PHOSPHATE 10 MG/ML IJ SOLN
INTRAMUSCULAR | Status: DC | PRN
  Administered 2021-03-08: 5 mg

## 2021-03-08 MED ORDER — LACTATED RINGERS IV SOLN: INTRAVENOUS | Status: DC

## 2021-03-08 MED ORDER — PROMETHAZINE HCL 25 MG/ML IJ SOLN: 6.2500 mg | INTRAMUSCULAR | Status: DC | PRN

## 2021-03-08 SURGICAL SUPPLY — 55 items
ADAPTER CATH SYR TO TUBING 38M (ADAPTER) IMPLANT
BAG COUNTER SPONGE SURGICOUNT (BAG) ×2 IMPLANT
BNDG COHESIVE 2X5 TAN STRL LF (GAUZE/BANDAGES/DRESSINGS) IMPLANT
BNDG ELASTIC 3X5.8 VLCR STR LF (GAUZE/BANDAGES/DRESSINGS) ×2 IMPLANT
BNDG ELASTIC 4X5.8 VLCR STR LF (GAUZE/BANDAGES/DRESSINGS) ×2 IMPLANT
BNDG ESMARK 4X9 LF (GAUZE/BANDAGES/DRESSINGS) IMPLANT
BNDG GAUZE ELAST 4 BULKY (GAUZE/BANDAGES/DRESSINGS) ×2 IMPLANT
CANNULA VESSEL 3MM 2 BLNT TIP (CANNULA) IMPLANT
CORD BIPOLAR FORCEPS 12FT (ELECTRODE) ×2 IMPLANT
COVER SURGICAL LIGHT HANDLE (MISCELLANEOUS) ×2 IMPLANT
CUFF TOURN SGL QUICK 18X4 (TOURNIQUET CUFF) ×2 IMPLANT
CUFF TOURN SGL QUICK 24 (TOURNIQUET CUFF)
CUFF TRNQT CYL 24X4X16.5-23 (TOURNIQUET CUFF) IMPLANT
DECANTER SPIKE VIAL GLASS SM (MISCELLANEOUS) IMPLANT
DRAIN PENROSE 1/4X12 LTX STRL (WOUND CARE) IMPLANT
DRSG PAD ABDOMINAL 8X10 ST (GAUZE/BANDAGES/DRESSINGS) ×4 IMPLANT
DRSG XEROFORM 1X8 (GAUZE/BANDAGES/DRESSINGS) ×2 IMPLANT
GAUZE PACKING IODOFORM 1/4X15 (PACKING) ×2 IMPLANT
GAUZE SPONGE 4X4 12PLY STRL (GAUZE/BANDAGES/DRESSINGS) ×2 IMPLANT
GAUZE XEROFORM 1X8 LF (GAUZE/BANDAGES/DRESSINGS) ×2 IMPLANT
GLOVE SRG 8 PF TXTR STRL LF DI (GLOVE) ×1 IMPLANT
GLOVE SURG ENC MOIS LTX SZ7.5 (GLOVE) ×2 IMPLANT
GLOVE SURG ORTHO LTX SZ8 (GLOVE) IMPLANT
GLOVE SURG POLY ORTHO LF SZ8 (GLOVE) IMPLANT
GLOVE SURG UNDER POLY LF SZ8 (GLOVE) ×1
GLOVE SURG UNDER POLY LF SZ8.5 (GLOVE) IMPLANT
GOWN STRL REUS W/ TWL LRG LVL3 (GOWN DISPOSABLE) ×1 IMPLANT
GOWN STRL REUS W/ TWL XL LVL3 (GOWN DISPOSABLE) ×1 IMPLANT
GOWN STRL REUS W/TWL LRG LVL3 (GOWN DISPOSABLE) ×1
GOWN STRL REUS W/TWL XL LVL3 (GOWN DISPOSABLE) ×1
KIT BASIN OR (CUSTOM PROCEDURE TRAY) ×2 IMPLANT
KIT TURNOVER KIT B (KITS) ×2 IMPLANT
LOOP VESSEL MAXI BLUE (MISCELLANEOUS) IMPLANT
MANIFOLD NEPTUNE II (INSTRUMENTS) ×2 IMPLANT
NEEDLE HYPO 25X1 1.5 SAFETY (NEEDLE) IMPLANT
NS IRRIG 1000ML POUR BTL (IV SOLUTION) ×2 IMPLANT
PACK ORTHO EXTREMITY (CUSTOM PROCEDURE TRAY) ×2 IMPLANT
PAD ARMBOARD 7.5X6 YLW CONV (MISCELLANEOUS) ×4 IMPLANT
PAD CAST 4YDX4 CTTN HI CHSV (CAST SUPPLIES) ×1 IMPLANT
PADDING CAST COTTON 4X4 STRL (CAST SUPPLIES) ×1
SET CYSTO W/LG BORE CLAMP LF (SET/KITS/TRAYS/PACK) ×2 IMPLANT
SOL PREP POV-IOD 4OZ 10% (MISCELLANEOUS) ×2 IMPLANT
SPONGE T-LAP 4X18 ~~LOC~~+RFID (SPONGE) ×2 IMPLANT
SUT ETHILON 4 0 P 3 18 (SUTURE) IMPLANT
SUT ETHILON 4 0 PS 2 18 (SUTURE) IMPLANT
SUT MON AB 5-0 P3 18 (SUTURE) IMPLANT
SWAB COLLECTION DEVICE MRSA (MISCELLANEOUS) ×2 IMPLANT
SWAB CULTURE ESWAB REG 1ML (MISCELLANEOUS) ×2 IMPLANT
SYR 20ML LL LF (SYRINGE) ×2 IMPLANT
SYR CONTROL 10ML LL (SYRINGE) IMPLANT
TOWEL GREEN STERILE (TOWEL DISPOSABLE) ×2 IMPLANT
TUBE CONNECTING 12X1/4 (SUCTIONS) ×2 IMPLANT
TUBE FEEDING ENTERAL 5FR 16IN (TUBING) IMPLANT
UNDERPAD 30X36 HEAVY ABSORB (UNDERPADS AND DIAPERS) ×2 IMPLANT
YANKAUER SUCT BULB TIP NO VENT (SUCTIONS) ×2 IMPLANT

## 2021-03-08 NOTE — Anesthesia Postprocedure Evaluation (Signed)
Anesthesia Post Note  Patient: Kelsey [name]  Procedure(s) Performed: IRRIGATION AND DEBRIDEMENT OF THUMB AND ARM (Left)     Patient location during evaluation: PACU Anesthesia Type: Regional Level of consciousness: awake and alert, patient cooperative and oriented Pain management: pain level controlled Vital Signs Assessment: post-procedure vital signs reviewed and stable Respiratory status: spontaneous breathing, nonlabored ventilation and respiratory function stable Cardiovascular status: blood pressure returned to baseline and stable Postop Assessment: no apparent nausea or vomiting and able to ambulate Anesthetic complications: no   No notable events documented.  Last Vitals:  Vitals:   03/08/21 1950 03/08/21 1955  BP:  112/65  Pulse: 61 62  Resp: 15 15  Temp:  37 C  SpO2: 96% 97%    Last Pain:  Vitals:   03/08/21 1955  TempSrc:   PainSc: 0-No pain                 Angelee Bahr,E. Maricus Tanzi

## 2021-03-08 NOTE — H&P (Signed)
Kelsey [name] is an 75 y.o. female.   Chief Complaint: infected cat bite HPI: 75 yo female states she was bitten by her cat yesterday on left hand.  Seen at [facility] where given Abx.  Has had worsening swelling, erythema, and red streak develop.  Seen in office earlier today.  No fevers or chills.  Allergies:  Allergies  Allergen Reactions   Penicillins Rash    Has patient had a PCN reaction causing immediate rash, facial/tongue/throat swelling, SOB or lightheadedness with hypotension: no Has patient had a PCN reaction causing severe rash involving mucus membranes or skin necrosis: yes Has patient had a PCN reaction that required hospitalization: no Has patient had a PCN reaction occurring within the last 10 years: no If all of the above answers are "NO", then may proceed with Cephalosporin use.    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    Uterine fibroid 11/2012   See Dr. Cletis Media note, surgery 11/2012; calcified- multiple   Vaginitis 09/13/2011    Past Surgical History:  Procedure Laterality Date   DILATATION & CURRETTAGE/HYSTEROSCOPY WITH RESECTOCOPE N/A 11/06/2012   Procedure: [place];  Surgeon: Alwyn Pea, MD;  Location: [facility];  Service: Gynecology;  Laterality: N/A;    HERNIA REPAIR  1992   small intestine hernia  1994   WISDOM TOOTH EXTRACTION      Family History: Family History  Problem Relation Age of Onset   Heart disease Mother    Osteoporosis Mother    Cancer Father        prostate   Osteoporosis Sister    Gout Brother    Gout Maternal Grandmother    Heart disease Maternal Grandmother    Alzheimer's disease Maternal Grandfather    Osteoporosis Sister     Social History:   reports that she quit smoking about 42 years ago. Her smoking use included cigarettes. She has a 3.00 pack-year smoking history. She has never used smokeless tobacco. She reports current alcohol use. She reports that she does not use drugs.  Medications: Medications Prior to Admission  Medication Sig Dispense Refill   atorvastatin (LIPITOR) 10 MG tablet Take 1 tablet (10 mg total) by mouth every evening. 90 tablet 3   Cyanocobalamin (VITAMIN B-12) 500 MCG SUBL 1 tab placed under the tongue daily (Patient taking differently: 625 mcg. 1 tab placed under the tongue daily; 5000 mcg cut into 8th) 90 tablet 3   omeprazole (PRILOSEC) 40 MG capsule Take 1 capsule (40 mg total) by mouth daily. 90 capsule 1   Vitamin D, Ergocalciferol, (DRISDOL) 1.25 MG (50000 UNIT) CAPS capsule Take 1 capsule (50,000 Units total) by  mouth every 7 (seven) days. 12 capsule 3   levofloxacin (LEVAQUIN) 500 MG tablet Take 1 tablet (500 mg total) by mouth daily. 7 tablet 0   metroNIDAZOLE (FLAGYL) 500 MG tablet Take 1 tablet (500 mg total) by mouth 2 (two) times daily. 14 tablet 0   mupirocin cream (BACTROBAN) 2 % Apply 1 application topically 2 (two) times daily. 15 g 0   Probiotic Product (ALIGN EXTRA STRENGTH) CAPS Take 1 capsule by mouth 4 (four) times daily. 120 capsule 0   sucralfate (CARAFATE) 1 g tablet TAKE 1 TABLET(1 GRAM) BY MOUTH FOUR TIMES DAILY AT BEDTIME WITH MEALS (Patient not taking: Reported on 03/08/2021) 120 tablet 5    Results for orders placed or performed during the hospital  encounter of 03/08/21 (from the past 48 hour(s))  CBC per protocol     Status: None   Collection Time: 03/08/21  3:57 PM  Result Value Ref Range   WBC 8.3 4.0 - 10.5 K/uL   RBC 3.96 3.87 - 5.11 MIL/uL   Hemoglobin 12.4 12.0 - 15.0 g/dL   HCT 38.6 36.0 - 46.0 %   MCV 97.5 80.0 - 100.0 fL   MCH 31.3 26.0 - 34.0 pg   MCHC 32.1 30.0 - 36.0 g/dL   RDW 13.5 11.5 - 15.5 %   Platelets 233 150 - 400 K/uL   nRBC 0.0 0.0 - 0.2 %    Comment: Performed at [facility], [place] [address], [place], [place] [address]    DG Hand Complete Left  Result Date: 03/08/2021 CLINICAL DATA:  Cat bite to the left hand. EXAM: LEFT HAND - COMPLETE 3+ VIEW COMPARISON:  Left thumb radiograph dated 01/16/2018. FINDINGS: There is no acute fracture or dislocation. The bones are osteopenic. Degenerative changes of the interphalangeal joints. The soft tissues are unremarkable. No radiopaque foreign object or soft tissue gas. IMPRESSION: No acute fracture or dislocation. Electronically Signed   By: Anner Crete M.D.   On: 03/08/2021 01:08     Pertinent items are noted in HPI.  Blood pressure 100/80, pulse 69, temperature 98.7 F (37.1 C), temperature source Oral, resp. rate 18, height '5\' 5"'$  (1.651 m), weight 46.7 kg, SpO2 97 %.  General appearance: alert, cooperative, and appears stated age Head: Normocephalic, without obvious abnormality, atraumatic Neck: supple, symmetrical, trachea midline Cardio: regular rate and rhythm Resp: clear to auscultation bilaterally Extremities: Intact sensation and capillary refill all digits.  +epl/fpl/io.  Bite wounds on left thenar eminence with surrounding erythema.  Proximal streak in forearm.  Tender to palpation at bite wounds. Pulses: 2+ and symmetric Skin: Skin color, texture, turgor normal. No rashes or lesions Neurologic: Grossly normal Incision/Wound: as above  Assessment/Plan Left thenar eminence and wrist infected cat bites.  Recommend OR for incision and  drainage.  Non operative and operative treatment options have been discussed with the patient and patient wishes to proceed with operative treatment. Risks, benefits, and alternatives of surgery have been discussed and the patient agrees with the plan of care.   Leanora Cover 03/08/2021, 5:19 PM

## 2021-03-08 NOTE — Telephone Encounter (Signed)
Pt currently admitted at [facility], given tdap and hand surgery. PCP is aware   [facility] - Client TELEPHONE ADVICE RECORD Access[facility]urse Patient Name: Kelsey [name] Gender: Female DOB: [birth year] Age: 75 Y 40 M 6 D Return Phone Number: [phone] (Primary) Address: City/State/Zip: [place] [address] Client [facility] - Client Client Site [facility] [facility] Physician [name], [place] Type Call Who Is Calling Patient / Member / Family / Caregiver Call Type Triage / Clinical Relationship To Patient Self Return Phone Number [phone] (Primary) Chief Complaint Animal Bite Reason for Call Symptomatic / Request for [place] states cat bit her and wants to know what to do Translation No Disp. Time Eilene Ghazi Time) Disposition Final User 03/07/2021 11:19:43 PM Attempt made - message left Wynetta Emery, RN, Faith 03/07/2021 11:29:39 PM FINAL ATTEMPT MADE - no message left Milana Obey, Faith 03/07/2021 11:29:52 PM Send to RN Final Attempt Clovis Cao, RN, Faith 03/08/2021 1:12:42 AM Attempt made - no message left Parrott, RN, Crystal 03/08/2021 1:27:50 AM FINAL ATTEMPT MADE - no message left Yes Parrott, RN, Crystal Comments User: Hamilton Capri, RN Date/Time (Eastern Time): 03/08/2021 1:13:27 AM Called only number available, went straight to voice mail but mailbox was full. Will attempt again per protocol. User: Hamilton Capri, RN Date/Time Eilene Ghazi Time): 03/08/2021 1:28:33 AM Final attempt: same as before, goes straight to voice mail but mailbox is full. Closing with failed attempt

## 2021-03-08 NOTE — Transfer of Care (Signed)
Immediate Anesthesia Transfer of Care Note  Patient: Kelsey [name]  Procedure(s) Performed: IRRIGATION AND DEBRIDEMENT OF THUMB AND ARM (Left)  Patient Location: PACU  Anesthesia Type:MAC  Level of Consciousness: drowsy and patient cooperative  Airway & Oxygen Therapy: Patient Spontanous Breathing and Patient connected to nasal cannula oxygen  Post-op Assessment: Report given to RN and Post -op Vital signs reviewed and stable  Post vital signs: Reviewed and stable  Last Vitals:  Vitals Value Taken Time  BP 109/69 03/08/21 1858  Temp    Pulse 63 03/08/21 1900  Resp 18 03/08/21 1900  SpO2 100 % 03/08/21 1900  Vitals shown include unvalidated device data.  Last Pain:  Vitals:   03/08/21 1715  TempSrc:   PainSc: 0-No pain         Complications: No notable events documented.

## 2021-03-08 NOTE — Anesthesia Preprocedure Evaluation (Addendum)
Anesthesia Evaluation  Patient identified by MRN, date of birth, ID band Patient awake    Reviewed: Allergy & Precautions, NPO status , Patient's Chart, lab work & pertinent test results  History of Anesthesia Complications Negative for: history of anesthetic complications  Airway Mallampati: II  TM Distance: >3 FB Neck ROM: Full    Dental no notable dental hx. (+) Dental Advisory Given   Pulmonary neg pulmonary ROS, former smoker,    Pulmonary exam normal        Cardiovascular negative cardio ROS Normal cardiovascular exam     Neuro/Psych PSYCHIATRIC DISORDERS Anxiety negative neurological ROS     GI/Hepatic Neg liver ROS, GERD  Medicated,  Endo/Other  negative endocrine ROS  Renal/GU negative Renal ROS     Musculoskeletal   Abdominal   Peds  Hematology negative hematology ROS (+)   Anesthesia Other Findings   Reproductive/Obstetrics                            Anesthesia Physical Anesthesia Plan  ASA: 2 and emergent  Anesthesia Plan:    Post-op Pain Management:    Induction:   PONV Risk Score and Plan: Ondansetron and Propofol infusion  Airway Management Planned: Natural Airway  Additional Equipment:   Intra-op Plan:   Post-operative Plan:   Informed Consent: I have reviewed the patients History and Physical, chart, labs and discussed the procedure including the risks, benefits and alternatives for the proposed anesthesia with the patient or authorized representative who has indicated his/her understanding and acceptance.     Dental advisory given  Plan Discussed with: Anesthesiologist and CRNA  Anesthesia Plan Comments:        Anesthesia Quick Evaluation

## 2021-03-08 NOTE — ED Provider Notes (Signed)
[facility] EMERGENCY DEPARTMENT Provider Note   CSN: [id number] Arrival date & time: 03/07/21  2246     History No chief complaint on file.   Kelsey [name] is a 75 y.o. female.  The history is provided by the patient.  Animal Bite Contact animal:  Cat Location:  Hand Hand animal bite location: L thenar eminence, multiple 3 mm puncture wounds hemostatic. Time since incident:  3 hours Incident location:  Home Provoked: unprovoked   Notifications:  None Animal's rabies vaccination status:  Up to date Animal in possession: yes   Tetanus status:  Unknown Relieved by:  Nothing Worsened by:  Nothing Ineffective treatments:  None tried Associated symptoms: no fever and no numbness   Patient with PCN allergy was bitten by her indoor cat this evening.  Cat is UTD on vaccinations and can be quarantined.      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    Uterine fibroid 11/2012   See Dr. 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   De Quervain's tenosynovitis, left 12/11/2017   Nonallopathic lesion of lumbosacral region  01/31/2017   Nonallopathic lesion of sacral region 01/03/2017   Nonallopathic lesion of thoracic region 01/03/2017   Cervical somatic dysfunction 12/07/2016   Pelvic somatic dysfunction 12/07/2016   SI (sacroiliac) joint dysfunction 12/07/2016   Abnormal mini-mental status exam 07/05/2016   Leg length discrepancy 01/25/2014   History of iron deficiency anemia 12/02/2013   Caregiver stress 12/02/2013   Vitamin D deficiency 12/03/2012   Fibrocystic breast 09/14/2011   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   GERD (gastroesophageal reflux disease)    Osteoporosis     Past Surgical History:  Procedure Laterality Date   DILATATION & CURRETTAGE/HYSTEROSCOPY WITH RESECTOCOPE N/A 11/06/2012   Procedure: DILATATION & CURETTAGE/HYSTEROSCOPY WITH RESECTOCOPE;  Surgeon: Alwyn Pea, MD;  Location: [facility];  Service: Gynecology;  Laterality: N/A;   HERNIA REPAIR  1992   small intestine hernia  1994   WISDOM TOOTH EXTRACTION       OB History     Gravida  0   Para      Term      Preterm      AB      Living         SAB      IAB      Ectopic      Multiple      Live Births              Family  History  Problem Relation Age of Onset   Heart disease Mother    Osteoporosis Mother    Cancer Father        prostate   Osteoporosis Sister    Gout Brother    Gout Maternal Grandmother    Heart disease Maternal Grandmother    Alzheimer's disease Maternal Grandfather    Osteoporosis Sister     Social History   Tobacco Use   Smoking status: Former    Packs/day: 0.30    Years: 10.00    Pack years: 3.00    Types: Cigarettes    Quit date: 07/09/1978    Years since quitting: 42.6   Smokeless tobacco: Never  Vaping Use   Vaping Use: Never used  Substance Use Topics   Alcohol use: Yes    Comment: 1-2 glasses of wine daily and a 12 oz beer daily   Drug use: No    Home Medications Prior to Admission medications   Medication Sig Start Date End Date Taking? Authorizing  Provider  atorvastatin (LIPITOR) 10 MG tablet Take 1 tablet (10 mg total) by mouth every evening. 09/28/20   [name], [name], DO  Cyanocobalamin (VITAMIN B-12) 500 MCG SUBL 1 tab placed under the tongue daily Patient taking differently: 625 mcg. 1 tab placed under the tongue daily; 5000 mcg cut into 8th 08/23/20   [name], [name], DO  omeprazole (PRILOSEC) 40 MG capsule Take 1 capsule (40 mg total) by mouth daily. 09/28/20   [name], [name], DO  sucralfate (CARAFATE) 1 g tablet TAKE 1 TABLET(1 GRAM) BY MOUTH FOUR TIMES DAILY AT BEDTIME WITH MEALS 03/30/20   [name], [name], DO  Vitamin D, Ergocalciferol, (DRISDOL) 1.25 MG (50000 UNIT) CAPS capsule Take 1 capsule (50,000 Units total) by mouth every 7 (seven) days. 08/23/20   [name], [name], DO    Allergies    Penicillins and Sulfa antibiotics  Review of Systems   Review of Systems  Constitutional:  Negative for fever.  HENT:  Negative for facial swelling.   Eyes:  Negative for redness.  Respiratory:  Negative for shortness of breath.   Cardiovascular:  Negative for leg swelling.  Gastrointestinal:  Negative for vomiting.  Musculoskeletal:  Positive for arthralgias. Negative for neck stiffness.  Skin:  Positive for wound.  Neurological:  Negative for numbness.  Psychiatric/Behavioral:  Negative for agitation.   All other systems reviewed and are negative.  Physical Exam Updated Vital Signs BP 99/68 (BP Location: Right Arm)   Pulse 68   Temp 98.8 F (37.1 C) (Oral)   Resp 18   Ht '5\' 5"'$  (1.651 m)   Wt 47.6 kg   SpO2 98%   BMI 17.47 kg/m   Physical Exam Vitals and nursing note reviewed.  Constitutional:      General: She is not in acute distress.    Appearance: Normal appearance.  HENT:     Head: Normocephalic and atraumatic.     Nose: Nose normal.  Eyes:     Conjunctiva/sclera: Conjunctivae normal.     Pupils: Pupils are equal, round, and reactive to light.  Cardiovascular:     Rate and Rhythm: Normal rate and regular  rhythm.     Pulses: Normal pulses.     Heart sounds: Normal heart sounds.  Pulmonary:     Effort: Pulmonary effort is normal.     Breath sounds: Normal breath sounds.  Abdominal:     General: Abdomen is flat. Bowel sounds are normal.  Tenderness: There is no abdominal tenderness.  Musculoskeletal:        General: Normal range of motion.     Left hand: No deformity or bony tenderness. Normal strength. Normal sensation. There is no disruption of two-point discrimination. Normal capillary refill. Normal pulse.       Arms:     Cervical back: Normal range of motion and neck supple.  Skin:    General: Skin is warm and dry.     Capillary Refill: Capillary refill takes less than 2 seconds.  Neurological:     General: No focal deficit present.     Mental Status: She is alert and oriented to person, place, and time.     Deep Tendon Reflexes: Reflexes normal.  Psychiatric:        Mood and Affect: Mood normal.        Behavior: Behavior normal.    ED Results / Procedures / Treatments   Labs (all labs ordered are listed, but only abnormal results are displayed) Labs Reviewed - No data to display  EKG None  Radiology DG Hand Complete Left  Result Date: 03/08/2021 CLINICAL DATA:  Cat bite to the left hand. EXAM: LEFT HAND - COMPLETE 3+ VIEW COMPARISON:  Left thumb radiograph dated 01/16/2018. FINDINGS: There is no acute fracture or dislocation. The bones are osteopenic. Degenerative changes of the interphalangeal joints. The soft tissues are unremarkable. No radiopaque foreign object or soft tissue gas. IMPRESSION: No acute fracture or dislocation. Electronically Signed   By: Anner Crete M.D.   On: 03/08/2021 01:08    Procedures Procedures   Medications Ordered in ED Medications  Tdap (BOOSTRIX) injection 0.5 mL (0.5 mLs Intramuscular Given 03/08/21 0116)  acetaminophen (TYLENOL) tablet 1,000 mg (1,000 mg Oral Given 03/08/21 0115)  levofloxacin (LEVAQUIN) tablet 500 mg (500 mg  Oral Given 03/08/21 0113)  metroNIDAZOLE (FLAGYL) tablet 500 mg (500 mg Oral Given 03/08/21 0115)    ED Course  I have reviewed the triage vital signs and the nursing notes.  Pertinent labs & imaging results that were available during my care of the patient were reviewed by me and considered in my medical decision making (see chart for details).   Given PCN allergy, extensive consultation with pharmacy regarding outpatient antibiotics.  Given small body habitus of patient levaquin and flagyl were selected along with QID probiotics and close follow up with hand surgery.  Will also prescribe mupirocin.  Wound care instructions given.  Patient will contact hand surgery for follow up in the am.  Strict return precautions given.  For fever, streaking up the arm, drainage worsening swelling or any concerns.    Kelsey [name] was evaluated in Emergency Department on 03/08/2021 for the symptoms described in the history of present illness. She was evaluated in the context of the global COVID-19 pandemic, which necessitated consideration that the patient might be at risk for infection with the SARS-CoV-2 virus that causes COVID-19. Institutional protocols and algorithms that pertain to the evaluation of patients at risk for COVID-19 are in a state of rapid change based on information released by regulatory bodies including the CDC and federal and state organizations. These policies and algorithms were followed during the patient's care in the ED.  Final Clinical Impression(s) / ED Diagnoses Return for intractable cough, coughing up blood, fevers > 100.4 unrelieved by medication, shortness of breath, intractable vomiting, chest pain, shortness of breath, weakness, numbness, changes in speech, facial asymmetry, abdominal pain, passing out, Inability to tolerate liquids or  food, cough, altered mental status or any concerns. No signs of systemic illness or infection. The patient is nontoxic-appearing on exam and  vital signs are within normal limits. I have reviewed the triage vital signs and the nursing notes. Pertinent labs & imaging results that were available during my care of the patient were reviewed by me and considered in my medical decision making (see chart for details). After history, exam, and medical workup I feel the patient has been appropriately medically screened and is safe for discharge home. Pertinent diagnoses were discussed with the patient. Patient was given return precautions.    Susi Goslin, MD 03/08/21 [id number]

## 2021-03-08 NOTE — Anesthesia Procedure Notes (Signed)
Procedure Name: MAC Date/Time: 03/08/2021 6:02 PM Performed by: Moshe Salisbury, CRNA Pre-anesthesia Checklist: Patient identified, Emergency Drugs available, Suction available, Patient being monitored and Timeout performed Patient Re-evaluated:Patient Re-evaluated prior to induction Oxygen Delivery Method: Nasal cannula Placement Confirmation: positive ETCO2 Dental Injury: Teeth and Oropharynx as per pre-operative assessment

## 2021-03-08 NOTE — Anesthesia Procedure Notes (Signed)
Anesthesia Regional Block: Supraclavicular block   Pre-Anesthetic Checklist: , timeout performed,  Correct Patient, Correct Site, Correct Laterality,  Correct Procedure, Correct Position, site marked,  Risks and benefits discussed,  Surgical consent,  Pre-op evaluation,  At surgeon's request and post-op pain management  Laterality: Left  Prep: chloraprep       Needles:  Injection technique: Single-shot  Needle Type: Echogenic Stimulator Needle     Needle Length: 5cm  Needle Gauge: 22     Additional Needles:   Narrative:  Start time: 03/08/2021 5:01 PM End time: 03/08/2021 5:11 PM Injection made incrementally with aspirations every 5 mL.  Performed by: Personally  Anesthesiologist: Duane Boston, MD  Additional Notes: Functioning IV was confirmed and monitors applied.  A 32m 22ga echogenic arrow stimulator was used. Sterile prep and drape,hand hygiene and sterile gloves were used.Ultrasound guidance: relevant anatomy identified, needle position confirmed, local anesthetic spread visualized around nerve(s)., vascular puncture avoided.  Image printed for medical record.  Negative aspiration and negative test dose prior to incremental administration of local anesthetic. The patient tolerated the procedure well.

## 2021-03-08 NOTE — Discharge Instructions (Addendum)

## 2021-03-08 NOTE — Progress Notes (Signed)
[facility] Discharge Note  Patient instuctions were given to family friend Helene Kelp. Wound care, diet, pain, follow up care and how and whom to contact with concerns were discussed. Family aware someone needs to remain with patient overnight and concerns after receiving anesthesia and what to avoid and safety. Answered all questions and concerns.   Discharge paperwork has clear contact informations for surgeon and 24 hour RN line for concerns.   Discussed what concerns to look for including infection and signs/symptoms to look for, when Surgeon follow up appointment is,this Friday and to call for time.   IV was removed prior to discharge. Patient was brought to car with belongings.   Pt exits my care.

## 2021-03-08 NOTE — Op Note (Addendum)
NAME: Kelsey [name] MEDICAL RECORD NO: [id number] DATE OF BIRTH: 1945-12-13 FACILITY: [facility] LOCATION: [facility] OR PHYSICIAN: Tennis Must, MD   OPERATIVE REPORT   DATE OF PROCEDURE: 03/08/21    PREOPERATIVE DIAGNOSIS: Left thumb infected cat bite   POSTOPERATIVE DIAGNOSIS: Left thumb infected cat bite   PROCEDURE: Incision and drainage left thumb including MP joint   SURGEON:  Leanora Cover, M.D.   ASSISTANT: none   ANESTHESIA:  Regional with sedation   INTRAVENOUS FLUIDS:  Per anesthesia flow sheet.   ESTIMATED BLOOD LOSS:  Minimal.   COMPLICATIONS:  None.   SPECIMENS: Cultures to micro   TOURNIQUET TIME:    Total Tourniquet Time Documented: Upper Arm (Left) - 40 minutes Total: Upper Arm (Left) - 40 minutes    DISPOSITION:  Stable to PACU.   INDICATIONS: 75 year old female states she was bitten by her cat yesterday evening.  She was seen at [facility] earlier this morning and given a dose of antibiotics.  Follow-up in the office.  She had developed redness pain and streaking up the arm.  I recommended incision and drainage in the operating room.  Risks, benefits and alternatives of surgery were discussed including the risks of blood loss, infection, damage to nerves, vessels, tendons, ligaments, bone for surgery, need for additional surgery, complications with wound healing, continued pain, stiffness, need for repeat irrigation and debridement.  She voiced understanding of these risks and elected to proceed.  OPERATIVE COURSE:  After being identified preoperatively by myself,  the patient and I agreed on the procedure and site of the procedure.  The surgical site was marked.  Surgical consent had been signed. She was given IV antibiotics as preoperative antibiotic prophylaxis. She was transferred to the operating room and placed on the operating table in supine position with the Left upper extremity on an arm board.  Sedation was induced by the  anesthesiologist. A regional block had been performed by anesthesia in preoperative holding.    Left upper extremity was prepped and draped in normal sterile orthopedic fashion.  A surgical pause was performed between the surgeons, anesthesia, and operating room staff and all were in agreement as to the patient, procedure, and site of procedure.  Tourniquet at the proximal aspect of the extremity was inflated to 250 mmHg after exsanguination of the arm with an Esmarch bandage.  Incision was made on the radial side of the thumb between the bite wounds.  This was carried in subcutaneous tissues by spreading technique.  There was thin watery fluid.  Cultures were taken for aerobes and anaerobes.  The bite wounds did not appear to coursed toward the flexor tendon.  They did however appear to coursed toward the MP joint.  The MP joint was opened.  There was no purulence within the joint.  There was some thin watery fluid.  The joint was copiously irrigated with sterile saline by vessel cannula.  The wound was copiously irrigated with sterile saline by cystoscopy tubing.  3000 cc was used.  The wound was then packed with quarter inch iodoform gauze.  A piece of iodoform gauze was placed in the rent in the joint capsule for a wick.  The wound was dressed with sterile Xeroform 4 x 4's and wrapped with a Kerlix bandage.  Thumb spica splint was placed and wrapped with Kerlix and Ace bandage.  The tourniquet was deflated at 40 minutes.  Fingertips were pink with brisk capillary refill after deflation of tourniquet.  The  operative  drapes were broken down.  The patient was awoken from anesthesia safely.  She was transferred back to the stretcher and taken to PACU in stable condition.  I will see her back in the office in 3-4 days for postoperative followup.  I will give her a prescription for Norco 5/325 1-2 tabs PO q6 hours prn pain, dispense # 20.  She already has a prescription for levofloxacin and metronidazole from the  emergency department last night that she will continue. Debridement type: Excisional Debridement  Side: left  Body Location: thumb   Tools used for debridement: scissors  Debridement depth beyond dead/damaged tissue down to healthy viable tissue: yes  Tissue layer involved: skin, subcutaneous tissue  Nature of tissue removed: none  Irrigation volume: 3000 cc     Irrigation fluid type: Normal Saline    Leanora Cover, MD Electronically signed, 03/08/21

## 2021-03-09 ENCOUNTER — Encounter (HOSPITAL_COMMUNITY): Payer: Self-pay | Admitting: Orthopedic Surgery

## 2021-03-10 DIAGNOSIS — S61052A Open bite of left thumb without damage to nail, initial encounter: Secondary | ICD-10-CM | POA: Diagnosis not present

## 2021-03-10 DIAGNOSIS — L089 Local infection of the skin and subcutaneous tissue, unspecified: Secondary | ICD-10-CM | POA: Diagnosis not present

## 2021-03-10 DIAGNOSIS — W5501XA Bitten by cat, initial encounter: Secondary | ICD-10-CM | POA: Diagnosis not present

## 2021-03-14 DIAGNOSIS — L089 Local infection of the skin and subcutaneous tissue, unspecified: Secondary | ICD-10-CM | POA: Diagnosis not present

## 2021-03-14 DIAGNOSIS — S61052A Open bite of left thumb without damage to nail, initial encounter: Secondary | ICD-10-CM | POA: Diagnosis not present

## 2021-03-14 DIAGNOSIS — W5501XA Bitten by cat, initial encounter: Secondary | ICD-10-CM | POA: Diagnosis not present

## 2021-03-14 LAB — AEROBIC/ANAEROBIC CULTURE W GRAM STAIN (SURGICAL/DEEP WOUND)

## 2021-03-17 DIAGNOSIS — H52223 Regular astigmatism, bilateral: Secondary | ICD-10-CM | POA: Diagnosis not present

## 2021-03-17 DIAGNOSIS — S61052A Open bite of left thumb without damage to nail, initial encounter: Secondary | ICD-10-CM | POA: Diagnosis not present

## 2021-03-17 DIAGNOSIS — H02834 Dermatochalasis of left upper eyelid: Secondary | ICD-10-CM | POA: Diagnosis not present

## 2021-03-17 DIAGNOSIS — H04123 Dry eye syndrome of bilateral lacrimal glands: Secondary | ICD-10-CM | POA: Diagnosis not present

## 2021-03-17 DIAGNOSIS — L089 Local infection of the skin and subcutaneous tissue, unspecified: Secondary | ICD-10-CM | POA: Diagnosis not present

## 2021-03-17 DIAGNOSIS — H25813 Combined forms of age-related cataract, bilateral: Secondary | ICD-10-CM | POA: Diagnosis not present

## 2021-03-17 DIAGNOSIS — H11153 Pinguecula, bilateral: Secondary | ICD-10-CM | POA: Diagnosis not present

## 2021-03-17 DIAGNOSIS — W5501XA Bitten by cat, initial encounter: Secondary | ICD-10-CM | POA: Diagnosis not present

## 2021-03-17 DIAGNOSIS — H524 Presbyopia: Secondary | ICD-10-CM | POA: Diagnosis not present

## 2021-03-20 ENCOUNTER — Encounter (HOSPITAL_COMMUNITY): Payer: Self-pay | Admitting: Orthopedic Surgery

## 2021-03-21 DIAGNOSIS — W5501XA Bitten by cat, initial encounter: Secondary | ICD-10-CM | POA: Diagnosis not present

## 2021-03-21 DIAGNOSIS — S61052A Open bite of left thumb without damage to nail, initial encounter: Secondary | ICD-10-CM | POA: Diagnosis not present

## 2021-03-21 DIAGNOSIS — L089 Local infection of the skin and subcutaneous tissue, unspecified: Secondary | ICD-10-CM | POA: Diagnosis not present

## 2021-03-21 NOTE — Progress Notes (Signed)
[facility] [place] Phone: [phone] Subjective:   [name], am serving as a scribe for Dr. [name].  This visit occurred during the SARS-CoV-2 public health emergency.  Safety protocols were in place, including screening questions prior to the visit, additional usage of staff PPE, and extensive cleaning of exam room while observing appropriate contact time as indicated for disinfecting solutions.   I'm seeing this patient by the request  of:  [name], [name], DO  CC: Neck pain and low back pain  [id number]  Celina JUDIT REBER is a 75 y.o. female coming in with complaint of back and neck pain. OMT on 02/22/2021. Patient states that her back is the same as last visit. Was bitten by a cat but is being monitored by Dr. Fredna Dow.  Patient continues to have dressing changes.  Continuing to be the primary caretaker for her husband.  Likely does have a little bit of family in town right now that is helping.  Medications patient has been prescribed: None  Taking:         Reviewed prior external information including notes and imaging from previsou exam, outside providers and external EMR if available.   As well as notes that were available from care everywhere and other healthcare systems.  We did review patient's recent ED visit as well as the surgical intervention necessary for the cat bite  Past medical history, social, surgical and family history all reviewed in electronic medical record.  No pertanent information unless stated regarding to the chief complaint.   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    Uterine fibroid 11/2012   See Dr. Cletis Media note, surgery 11/2012; calcified- multiple   Vaginitis 09/13/2011    Allergies  Allergen Reactions   Penicillins Rash    Has patient had a PCN reaction causing immediate rash, facial/tongue/throat swelling, SOB or lightheadedness with hypotension: no Has patient had a PCN reaction causing severe rash involving mucus membranes or skin necrosis: yes Has patient had a PCN reaction that required hospitalization: no Has patient had a PCN reaction occurring within the last 10 years: no If all of the above answers are "NO", then may proceed with Cephalosporin use.    Sulfa Antibiotics Rash    Under arm and thighs     Review of Systems:  No headache, visual changes, nausea, vomiting, diarrhea, constipation, dizziness, abdominal pain, skin rash, fevers, chills, night sweats, weight loss, swollen lymph nodes, body aches, joint swelling, chest pain, shortness of breath, mood changes. POSITIVE muscle aches  Objective  Blood pressure 98/62, pulse 77, height '5\' 5"'$  (1.651 m), weight 106 lb (48.1 kg), SpO2 97 %.   General: No apparent distress  alert and oriented x3 mood and affect normal, dressed appropriately.  HEENT: Pupils equal, extraocular movements intact  Respiratory: Patient's speak in full sentences and does not appear short of breath  Cardiovascular: No lower extremity edema, non tender, no erythema  Back exam does show significant loss of lordosis.  Degenerative scoliosis noted.  Patient does have tightness with straight leg test .  Patient has negative FABER test.  Pain from the thoracolumbar juncture on down to the sacroiliac joint  bilaterally.  Worsening pain with anything greater than 5 degrees of extension.  Does also have some tightness in the neck noted.  Patient's hand on the left side is wearing a lap around the [facility] joint.  Osteopathic findings  C2 flexed rotated and side bent right C6 flexed rotated and side bent left T3 extended rotated and side bent right inhaled rib T7 extended rotated and side bent left L2 flexed rotated and side bent right L5 flexed rotated and side bent left Sacrum right on right       Assessment and Plan:  SI (sacroiliac) joint dysfunction Continued lumbar spinal stenosis as well as the sacroiliac joint dysfunction.  Patient is still the primary caregiver for her ailing husband as well as ailing cat.  Patient is still doing a lot of repetitive lifting.  Recently had a Bite needs to be careful with lifting the mechanism.  Patient was given different goals with her occupational therapy as well.  Discussed with her to continue to do all the exercises for the back if possible..  Follow-up with me again in 4 to 6 weeks   Nonallopathic problems  Decision today to treat with OMT was based on Physical Exam  After verbal consent patient was treated with HVLA, ME, FPR techniques in cervical, rib, thoracic, lumbar, and sacral  areas  Patient tolerated the procedure well with improvement in symptoms  Patient given exercises, stretches and lifestyle modifications  See medications in patient instructions if given  Patient will follow up in 4-6 weeks     The above documentation has been reviewed and is accurate and complete Lyndal Pulley, DO        Note: This dictation was prepared with Dragon dictation along with smaller phrase technology. Any transcriptional errors that result from this process are unintentional.

## 2021-03-22 ENCOUNTER — Encounter: Payer: Self-pay | Admitting: Counselor

## 2021-03-22 ENCOUNTER — Other Ambulatory Visit: Payer: Self-pay

## 2021-03-22 ENCOUNTER — Ambulatory Visit (INDEPENDENT_AMBULATORY_CARE_PROVIDER_SITE_OTHER): Payer: Medicare PPO | Admitting: Family Medicine

## 2021-03-22 VITALS — BP 98/62 | HR 77 | Ht 65.0 in | Wt 106.0 lb

## 2021-03-22 DIAGNOSIS — M9903 Segmental and somatic dysfunction of lumbar region: Secondary | ICD-10-CM

## 2021-03-22 DIAGNOSIS — M9902 Segmental and somatic dysfunction of thoracic region: Secondary | ICD-10-CM

## 2021-03-22 DIAGNOSIS — M533 Sacrococcygeal disorders, not elsewhere classified: Secondary | ICD-10-CM

## 2021-03-22 DIAGNOSIS — M9904 Segmental and somatic dysfunction of sacral region: Secondary | ICD-10-CM | POA: Diagnosis not present

## 2021-03-22 DIAGNOSIS — M9908 Segmental and somatic dysfunction of rib cage: Secondary | ICD-10-CM

## 2021-03-22 DIAGNOSIS — M9901 Segmental and somatic dysfunction of cervical region: Secondary | ICD-10-CM | POA: Diagnosis not present

## 2021-03-22 NOTE — Patient Instructions (Signed)
Try to take care of yourself See me in 4-5 weeks

## 2021-03-23 NOTE — Assessment & Plan Note (Signed)
Continued lumbar spinal stenosis as well as the sacroiliac joint dysfunction.  Patient is still the primary caregiver for her ailing husband as well as ailing cat.  Patient is still doing a lot of repetitive lifting.  Recently had a Bite needs to be careful with lifting the mechanism.  Patient was given different goals with her occupational therapy as well.  Discussed with her to continue to do all the exercises for the back if possible..  Follow-up with me again in 4 to 6 weeks

## 2021-03-24 DIAGNOSIS — W5501XA Bitten by cat, initial encounter: Secondary | ICD-10-CM | POA: Diagnosis not present

## 2021-03-24 DIAGNOSIS — S61052A Open bite of left thumb without damage to nail, initial encounter: Secondary | ICD-10-CM | POA: Diagnosis not present

## 2021-03-24 DIAGNOSIS — L089 Local infection of the skin and subcutaneous tissue, unspecified: Secondary | ICD-10-CM | POA: Diagnosis not present

## 2021-03-28 DIAGNOSIS — S61052A Open bite of left thumb without damage to nail, initial encounter: Secondary | ICD-10-CM | POA: Diagnosis not present

## 2021-03-28 DIAGNOSIS — M25642 Stiffness of left hand, not elsewhere classified: Secondary | ICD-10-CM | POA: Diagnosis not present

## 2021-03-28 DIAGNOSIS — L089 Local infection of the skin and subcutaneous tissue, unspecified: Secondary | ICD-10-CM | POA: Diagnosis not present

## 2021-03-28 DIAGNOSIS — W5501XA Bitten by cat, initial encounter: Secondary | ICD-10-CM | POA: Diagnosis not present

## 2021-03-30 ENCOUNTER — Other Ambulatory Visit: Payer: Self-pay

## 2021-03-30 ENCOUNTER — Encounter (HOSPITAL_COMMUNITY): Payer: Self-pay

## 2021-03-30 ENCOUNTER — Emergency Department (HOSPITAL_COMMUNITY)
Admission: EM | Admit: 2021-03-30 | Discharge: 2021-03-31 | Disposition: A | Discharge status: Home / Self Care | Payer: Medicare PPO | Attending: Emergency Medicine | Admitting: Emergency Medicine

## 2021-03-30 DIAGNOSIS — L03317 Cellulitis of buttock: Secondary | ICD-10-CM | POA: Diagnosis not present

## 2021-03-30 DIAGNOSIS — Z87891 Personal history of nicotine dependence: Secondary | ICD-10-CM | POA: Diagnosis not present

## 2021-03-30 DIAGNOSIS — R21 Rash and other nonspecific skin eruption: Secondary | ICD-10-CM | POA: Diagnosis present

## 2021-03-30 NOTE — ED Triage Notes (Signed)
Pt complains of abscess to right buttock x 1 week.

## 2021-03-30 NOTE — ED Provider Notes (Signed)
Emergency Medicine Provider Triage Evaluation Note  Kelsey [name] , a 75 y.o. female  was evaluated in triage.  Pt complains of abscess to right buttock that has been present for the past week. PT ws here in the ED with her husband who is being admitted. She reports while sitting in the chair at bedside for several hours she began having worsening pain and decided to check in herself for evaluation. She does not believe she has noticed any drainage to the area. She is currently on abx for a cat bat to her left hand.  Review of Systems  Positive: + abscess Negative: - fevers, chills  Physical Exam  There were no vitals taken for this visit. Gen:   Awake, no distress   Resp:  Normal effort  MSK:   Moves extremities without difficulty  Other:  Chaperone present for exam. 2 x 2 cm area of induration with central scabbing to right buttock/perineum. Does not extend into rectum.   Medical Decision Making  Medically screening exam initiated at 11:17 PM.  Appropriate orders placed.  AYLEEN MCKINSTRY was informed that the remainder of the evaluation will be completed by another provider, this initial triage assessment does not replace that evaluation, and the importance of remaining in the ED until their evaluation is complete.     Eustaquio Maize, PA-C 03/30/21 [id number]    Blanchie Dessert, MD 03/31/21 [phone]

## 2021-03-31 DIAGNOSIS — L089 Local infection of the skin and subcutaneous tissue, unspecified: Secondary | ICD-10-CM | POA: Diagnosis not present

## 2021-03-31 DIAGNOSIS — W5501XA Bitten by cat, initial encounter: Secondary | ICD-10-CM | POA: Diagnosis not present

## 2021-03-31 DIAGNOSIS — L03317 Cellulitis of buttock: Secondary | ICD-10-CM | POA: Diagnosis not present

## 2021-03-31 DIAGNOSIS — S61052A Open bite of left thumb without damage to nail, initial encounter: Secondary | ICD-10-CM | POA: Diagnosis not present

## 2021-03-31 MED ORDER — DOXYCYCLINE HYCLATE 100 MG PO CAPS: 100.0000 mg | ORAL_CAPSULE | Freq: Two times a day (BID) | ORAL | 0 refills | Status: AC

## 2021-03-31 NOTE — Discharge Instructions (Signed)
You were seen in the ED today with area of skin infection on your bottom. I am starting you on antibiotics. Return with any new or worsening symptoms.

## 2021-03-31 NOTE — ED Provider Notes (Signed)
Emergency Department Provider Note   I have reviewed the triage vital signs and the nursing notes.   HISTORY  Chief Complaint Abscess   HPI Kelsey [name] is a 75 y.o. female presents emergency department with pain in the gluteal area.  She notes several days of discomfort which is becoming more uncomfortable when she sits in the area.  She can feel something more firm in the lower, right buttock.  No fevers or chills.  No drainage.  No pain with bowel movements.  Pain is moderate and worse with sitting.  No radiation of symptoms or other modifying factors.   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    Uterine fibroid 11/2012   See Dr. 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   De Quervain's tenosynovitis, left 12/11/2017   Nonallopathic lesion of lumbosacral region 01/31/2017   Nonallopathic lesion of sacral region 01/03/2017   Nonallopathic lesion of thoracic region 01/03/2017   Cervical somatic dysfunction 12/07/2016   Pelvic somatic dysfunction 12/07/2016   SI (sacroiliac) joint dysfunction 12/07/2016   Abnormal  mini-mental status exam 07/05/2016   Leg length discrepancy 01/25/2014   History of iron deficiency anemia 12/02/2013   Caregiver stress 12/02/2013   Vitamin D deficiency 12/03/2012   Fibrocystic breast 09/14/2011   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   GERD (gastroesophageal reflux disease)    Osteoporosis     Past Surgical History:  Procedure Laterality Date   DILATATION & CURRETTAGE/HYSTEROSCOPY WITH RESECTOCOPE N/A 11/06/2012   Procedure: DILATATION & CURETTAGE/HYSTEROSCOPY WITH RESECTOCOPE;  Surgeon: Alwyn Pea, MD;  Location: [facility];  Service: Gynecology;  Laterality: N/A;   HERNIA REPAIR  1992   I & D EXTREMITY Left 03/08/2021   Procedure: IRRIGATION AND DEBRIDEMENT OF THUMB AND ARM;  Surgeon: Leanora Cover, MD;  Location: [facility];  Service: Orthopedics;  Laterality: Left;   small intestine hernia  1994   WISDOM TOOTH EXTRACTION      Allergies Penicillins and Sulfa antibiotics  Family History  Problem Relation Age of Onset   Heart disease Mother    Osteoporosis Mother    Cancer Father        prostate   Osteoporosis Sister    Gout Brother    Gout Maternal Grandmother    Heart disease Maternal Grandmother    Alzheimer's disease Maternal Grandfather    Osteoporosis Sister     Social History Social History   Tobacco Use   Smoking status: Former    Packs/day: 0.30    Years:  10.00    Pack years: 3.00    Types: Cigarettes    Quit date: 07/09/1978    Years since quitting: 42.7   Smokeless tobacco: Never  Vaping Use   Vaping Use: Never used  Substance Use Topics   Alcohol use: Yes    Comment: 1-2 glasses of wine daily and a 12 oz beer daily   Drug use: No    Review of Systems  Constitutional: No fever/chills Eyes: No visual changes. ENT: No sore throat. Cardiovascular: Denies chest pain. Respiratory: Denies shortness of breath. Gastrointestinal: No abdominal pain.  No nausea, no vomiting.  No diarrhea.  No constipation. Genitourinary: Negative for  dysuria. Musculoskeletal: Negative for back pain. Skin: Right gluteal pain/rash.  Neurological: Negative for headaches, focal weakness or numbness.  10-point ROS otherwise negative.  ____________________________________________   PHYSICAL EXAM:  VITAL SIGNS: ED Triage Vitals  Enc Vitals Group     BP 03/30/21 2317 108/72     Pulse Rate 03/30/21 2317 72     Resp 03/30/21 2317 16     Temp 03/30/21 2317 98 F (36.7 C)     Temp Source 03/30/21 2317 Oral     SpO2 03/30/21 2317 96 %    Constitutional: Alert and oriented. Well appearing and in no acute distress. Eyes: Conjunctivae are normal.  Head: Atraumatic. Nose: No congestion/rhinnorhea. Mouth/Throat: Mucous membranes are moist.  Neck: No stridor.  Cardiovascular: Normal rate, regular rhythm. Good peripheral circulation. Grossly normal heart sounds.   Respiratory: Normal respiratory effort.  No retractions. Lungs CTAB. Gastrointestinal: Soft and nontender. No distention.  Musculoskeletal: No gross deformities of extremities. Neurologic:  Normal speech and language.  Skin:  Skin is warm and dry. 3 cm area of cellulitis at the inferior right buttock. No fluctuance or active drainage. Area is mainly indurated.    ____________________________________________   PROCEDURES  Procedure(s) performed:   Procedures  None  ____________________________________________   INITIAL IMPRESSION / ASSESSMENT AND PLAN / ED COURSE  Pertinent labs & imaging results that were available during my care of the patient were reviewed by me and considered in my medical decision making (see chart for details).   Patient presents to the emergency department for evaluation of rash to the lower gluteal area.  The area on my exam seems most consistent with focal cellulitis.  No fluctuance or area of clear fluid collection/abscess requiring incision and drainage.  Plan to start doxycycline and will have the patient return with any new or suddenly  worsening symptoms as this could develop into an abscess and require drainage, as discussed.   ____________________________________________  FINAL CLINICAL IMPRESSION(S) / ED DIAGNOSES  Final diagnoses:  Cellulitis of buttock    NEW OUTPATIENT MEDICATIONS STARTED DURING THIS VISIT:  Discharge Medication List as of 03/31/2021  4:49 AM     START taking these medications   Details  doxycycline (VIBRAMYCIN) 100 MG capsule Take 1 capsule (100 mg total) by mouth 2 (two) times daily for 7 days., Starting Fri 03/31/2021, Until Fri 04/07/2021, Print        Note:  This document was prepared using Dragon voice recognition software and may include unintentional dictation errors.  Nanda Quinton, MD, [facility] Emergency Medicine    Tinisha Etzkorn, W[facility], MD 04/04/21 [phone]

## 2021-04-17 NOTE — Progress Notes (Signed)
Kel[place] [name] Phone: [phone] Subjective:   Kelsey [name], am serving as a scribe for Dr. [name]. This visit occurred during the SARS-CoV-2 public health emergency.  Safety protocols were in place, including screening questions prior to the visit, additional usage of staff PPE, and extensive cleaning of exam room while observing appropriate contact time as indicated for disinfecting solutions.   I'm seeing this patient by the request  of:  [name], [name], DO  CC: Low back and neck pain  [id number]  Kelsey [name] is a 75 y.o. female coming in with complaint of back and neck pain. OMT on 03/22/2021. Patient states that her lower back is bothering her as she is lifting her husband a lot.  Patient has had some increasing back pain recently.  Has been unfortunately having to take care of her ailing husband and is on a more regular basis.  Medications patient has been prescribed: None           Reviewed prior external information including notes and imaging from previsou exam, outside providers and external EMR if available.   As well as notes that were available from care everywhere and other healthcare systems.  Past medical history, social, surgical and family history all reviewed in electronic medical record.  [name] pertanent information unless stated regarding to the chief complaint.   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    Uterine fibroid 11/2012   See Dr. Cletis Media note, surgery 11/2012; calcified- multiple   Vaginitis 09/13/2011    Allergies  Allergen Reactions   Penicillins Rash    Has patient had a PCN reaction causing immediate rash, facial/tongue/throat swelling, SOB or lightheadedness with hypotension: [name] Has patient had a PCN reaction causing severe rash involving mucus membranes or skin necrosis: yes Has patient had a PCN reaction that required hospitalization: [name] Has patient had a PCN reaction occurring within the last 10 years: [name] If all of the above answers are "[name]", then may proceed with Cephalosporin use.    Sulfa Antibiotics Rash    Under arm and thighs     Review of Systems:  [name] headache, visual changes, nausea, vomiting, diarrhea, constipation, dizziness, abdominal pain, skin rash, fevers, chills, night sweats, weight loss, swollen lymph nodes, body aches, joint swelling, chest pain, shortness of breath, mood changes. POSITIVE muscle aches  Objective  Blood pressure 98/60, pulse 93, height 5\' 5"  (1.651 m), weight 107 lb (48.5 kg), SpO2 96 %.   General: [name] apparent distress alert and oriented x3 mood and affect normal, dressed appropriately.  HEENT: Pupils equal, extraocular movements intact  Respiratory: Patient's speak in full sentences and does not appear short of breath  Cardiovascular: [name] lower extremity edema, non tender, [name] erythema  Patient's low back does have significant loss of lordosis.  Some tenderness to palpation of the paraspinal musculature.  Does have limited in flexion and only 5 degrees of extension.  Neurovascular intact distally.  Osteopathic findings   T9 extended rotated and side bent left L2 flexed rotated and side bent right Sacrum right on right       Assessment and Plan:  Degenerative lumbar spinal  stenosis Degenerative spinal stenosis.  Patient has responded well to home exercises as well as the formal physical therapy.  Chronic problem with exacerbation.  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  Encouraged her to have him look into the possibility of hospice at this point so patient could get potentially more resources to allow her to be less dependent on her to help him with the movement.  Patient does tell a story that she did fall and had to lay on the ground with him for quite some time.  Discussed the Tylenol for any type of breakthrough pain which patient does not like to take.  Follow-up with me again in 4 weeks   Nonallopathic problems  Decision today to treat with OMT was based on Physical Exam  After verbal consent patient was treated with HVLA, ME, FPR techniques in  thoracic, lumbar, and sacral  areas  Patient tolerated the procedure well with improvement in symptoms  Patient given exercises, stretches and lifestyle modifications  See medications in patient instructions if given  Patient will follow up in 4-8 weeks      The above documentation has been reviewed and is accurate and complete Lyndal Pulley, DO        Note: This dictation was prepared with Dragon dictation along with smaller phrase technology. Any transcriptional errors that result from this process are unintentional.

## 2021-04-18 ENCOUNTER — Other Ambulatory Visit: Payer: Self-pay

## 2021-04-18 ENCOUNTER — Ambulatory Visit (INDEPENDENT_AMBULATORY_CARE_PROVIDER_SITE_OTHER): Payer: Medicare PPO | Admitting: Family Medicine

## 2021-04-18 ENCOUNTER — Encounter: Payer: Self-pay | Admitting: Family Medicine

## 2021-04-18 VITALS — BP 98/60 | HR 93 | Ht 65.0 in | Wt 107.0 lb

## 2021-04-18 DIAGNOSIS — M9904 Segmental and somatic dysfunction of sacral region: Secondary | ICD-10-CM

## 2021-04-18 DIAGNOSIS — M9902 Segmental and somatic dysfunction of thoracic region: Secondary | ICD-10-CM | POA: Diagnosis not present

## 2021-04-18 DIAGNOSIS — M9903 Segmental and somatic dysfunction of lumbar region: Secondary | ICD-10-CM | POA: Diagnosis not present

## 2021-04-18 DIAGNOSIS — M48061 Spinal stenosis, lumbar region without neurogenic claudication: Secondary | ICD-10-CM

## 2021-04-18 NOTE — Patient Instructions (Signed)
Look into [facility] belt Ask about hospice for the resources See me agaiin 4-6 weeks

## 2021-04-18 NOTE — Assessment & Plan Note (Signed)
Degenerative spinal stenosis.  Patient has responded well to home exercises as well as the formal physical therapy.  Chronic problem with exacerbation.  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  Encouraged her to have him look into the possibility of hospice at this point so patient could get potentially more resources to allow her to be less dependent on her to help him with the movement.  Patient does tell a story that she did fall and had to lay on the ground with him for quite some time.  Discussed the Tylenol for any type of breakthrough pain which patient does not like to take.  Follow-up with me again in 4 weeks

## 2021-04-26 ENCOUNTER — Encounter: Payer: Self-pay | Admitting: Counselor

## 2021-04-26 ENCOUNTER — Ambulatory Visit: Payer: Medicare PPO | Admitting: Psychology

## 2021-04-26 ENCOUNTER — Other Ambulatory Visit: Payer: Self-pay

## 2021-04-26 ENCOUNTER — Ambulatory Visit (INDEPENDENT_AMBULATORY_CARE_PROVIDER_SITE_OTHER): Payer: Medicare PPO | Admitting: Counselor

## 2021-04-26 DIAGNOSIS — G3184 Mild cognitive impairment, so stated: Secondary | ICD-10-CM | POA: Diagnosis not present

## 2021-04-26 DIAGNOSIS — F09 Unspecified mental disorder due to known physiological condition: Secondary | ICD-10-CM

## 2021-04-26 NOTE — Progress Notes (Signed)
   Psychometrist Note   Cognitive testing was administered to [employer] by Milana Kidney, B.S. (Technician) under the supervision of Alphonzo Severance, Psy.D., ABN. Ms. Tyrell was able to tolerate all test procedures. Dr. Nicole Kindred met with the patient as needed to manage any emotional reactions to the testing procedures. Rest breaks were offered.    The battery of tests administered was selected by Dr. Nicole Kindred with consideration to the patient's current level of functioning, the nature of her symptoms, emotional and behavioral responses during the interview, level of literacy, observed level of motivation/effort, and the nature of the referral question. This battery was communicated to the psychometrist. Communication between Dr. Nicole Kindred and the psychometrist was ongoing throughout the evaluation and Dr. Nicole Kindred was immediately accessible at all times. Dr. Nicole Kindred provided supervision to the technician on the date of this service, to the extent necessary to assure the quality of all services provided.    Ms. Morgan will return in approximately one week for an interactive feedback session with Dr. Nicole Kindred, at which time test performance, clinical impressions, and treatment recommendations will be reviewed in detail. The patient understands she can contact our office should she require our assistance before this time.   A total of 110 minutes of billable time were spent with Scherrie Gerlach by the technician, including test administration and scoring time. Billing for these services is reflected in Dr. Les Pou note.   This note reflects time spent with the psychometrician and does not include test scores, clinical history, or any interpretations made by Dr. Nicole Kindred. The full report will follow in a separate note.

## 2021-04-26 NOTE — Progress Notes (Signed)
[facility]  Patient Name: JANEANE COZART MRN: [id number] Date of Birth: September 11, 1945 Age: 75 y.o. Education: 17 years  Referral Circumstances and Background Information  Ms. Lauralie Metzger is a 75 y.o., right-hand dominant, married woman with a history of vitamin D and B deficiency (she would like to correct but is forgetting to take them), HLD, and concerns about memory and thinking who was referred by Dr. Delice Lesch for neuropsychological evaluation in the service of diagnostic clarification. The patient had a 29/30 on the MoCA, particularly impressive given her status as a non-native [place] speaker, and Dr. Delice Lesch was wondering about the extent to which anxiety and caregiver burnout may be impacting her abilities.   On interview, the patient reported that she started noticing cognitive changes about 5 years ago, they were gradual, and it sounds like they have been worsening over time although very slightly. This started with losing her train of thought and forgetting what she was going to say. She also has a hard time with names. She wasn't particularly worried though, because her friends have these type of issues also. She eventually became concerned about 3 years later, it wasn't clear if it was related to increased cognitive problems or the progression of her husband's dementia (husband has severe dementia). She reported that she started having a harder time focusing, she started having a harder time with navigating in the car, and she started have more difficulty remembering to bring the notes that she takes. She denied that anyone had commented on her memory problems to me although I see previously she reported to Dr. Delice Lesch that her son has said she forgets things and repeats herself. I see mention also in Dr. Amparo Bristol note of an incident where she signed up for a memory fitness class but then realized that she had already taken it. With respect to  orientation, she has issues recalling the specific date but not so much the month or the year. She has difficulties with word finding, both in [place] and in [place] (she is a native of [place] and emigrated to the [place] in the 1970s). With respect to mood, the patient reported that she is maintaining but she admits to a lot of stress. Her husband is in his mid 48s and has end stage dementia, requiring a lot of help. She reported that she feels "a little bit stressed" with caring for him. Her husband has 3 children from a previous marriage, but they are not involved in caring for him. The oldest thinks her husband should be transferred to a facility. She admits to feeling sad at times, because she misses conversing with her husband. She reported that she is sleeping adequately, but then said that it is only 5 or 6 hours according to fit bit. She usually is in bed for 7 hours but doesn't always sleep. Her appetite is good. Her energy is adequate but she wishes she had more.   With respect to functioning, the patient is fully independent and denied that she is having any appreciable difficulties in any area. She and her husband live in in a house, and she takes care of everything. Her husband has late stage dementia and requires assistance even with basic things such as eating. She has missed some bills, although she says it is only a few times a year and is not really a change. The patient is still driving, although she is not as good with directions as in the past. She  does use GPS and does fine with that. She feels like she used to know [place] [employer] very well and she does not now, although there have been a lot of changes. She will occasionally have to pull over to reorient herself but hasn't really gotten lost. Around the house, she still cooks, although she stated that has gotten "very bad." She burned up a pan the other day, she notices she is not as good at multitasking. She has been able to compensate for  the most part. She is about as good as ever at using a smart phone or tablet. The patient is doing her husband's medications and is accurate with them but sometimes forgets her own medications. With respect to social support, the patient reported that she has good neighbors whom she sees once in a while. She does have some friends but doesn't see them much with her care giving responsibilities. 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   De Quervain's tenosynovitis, left 12/11/2017   Nonallopathic lesion of lumbosacral region 01/31/2017   Nonallopathic lesion of sacral region 01/03/2017   Nonallopathic lesion of thoracic region 01/03/2017   Cervical somatic dysfunction 12/07/2016   Pelvic somatic dysfunction 12/07/2016   SI (sacroiliac) joint dysfunction 12/07/2016   Abnormal mini-mental status exam 07/05/2016   Leg length discrepancy 01/25/2014   History of iron deficiency anemia 12/02/2013   Caregiver stress 12/02/2013   Vitamin D deficiency 12/03/2012   Fibrocystic breast 09/14/2011   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 She has no neurological surgery.   The patient has an MRI brain from 10/17/2020, which shows normal brain morphology and volume for age. If anything, her brain volume is in fact quite well preserved, particularly within the mesial temporal structures with a slit like appearance to the temporal horns despite her 75 years of age.   Current Outpatient Medications  Medication Sig Dispense Refill   atorvastatin (LIPITOR)  10 MG tablet Take 1 tablet (10 mg total) by mouth every evening. 90 tablet 3   Cyanocobalamin (VITAMIN B-12) 500 MCG SUBL 1 tab placed under the tongue daily (Patient taking differently: 625 mcg. 1 tab placed under the tongue daily; 5000 mcg cut into 8th) 90 tablet 3   HYDROcodone-acetaminophen (NORCO) 5-325 MG tablet 1 tab po q6 hours prn pain 20 tablet 0   levofloxacin (LEVAQUIN) 500 MG tablet Take 1 tablet (500 mg total) by mouth daily. 7 tablet 0   metroNIDAZOLE (FLAGYL) 500 MG tablet Take 1 tablet (500 mg total) by mouth 2 (two) times daily. 14 tablet 0   mupirocin cream (BACTROBAN) 2 % Apply 1 application topically 2 (two) times daily. 15 g 0   omeprazole (PRILOSEC) 40 MG capsule Take 1 capsule (40 mg total) by mouth daily. 90 capsule 1   Probiotic Product (ALIGN EXTRA STRENGTH) CAPS Take 1 capsule by mouth 4 (four) times daily. 120 capsule 0   sucralfate (CARAFATE) 1 g tablet TAKE 1 TABLET(1 GRAM) BY MOUTH FOUR TIMES DAILY AT BEDTIME WITH MEALS 120 tablet 5   Vitamin D, Ergocalciferol, (DRISDOL)  1.25 MG (50000 UNIT) CAPS capsule Take 1 capsule (50,000 Units total) by mouth every 7 (seven) days. 12 capsule 3   No current facility-administered medications for this visit.   Family History  Problem Relation Age of Onset   Heart disease Mother    Osteoporosis Mother    Cancer Father        prostate   Osteoporosis Sister    Gout Brother    Gout Maternal Grandmother    Heart disease Maternal Grandmother    Alzheimer's disease Maternal Grandfather    Osteoporosis Sister    There is a strong family history of dementia. Her mother developed dementia but she was in her mid 53s. Her paternal uncle also developed the condition but he lived to 61. Her maternal grandfather also had dementia, she wasn't sure when he developed it but he passed in his 52s and had it "a long time." She has 3 siblings, the oldest of whom is 77 and the youngest is 79. She thinks they are doing adequately with memory and  thinking. There is no  family history of psychiatric illness.  Psychosocial History  Developmental, Educational and Employment History: The patient is a native of [place]. She grew [place] in [place]. She first came to the [place] in the 1970s for school, she went back to [place] but then returned to the [place] in [place]. She then lived in the [place] in [place] from 1979 to 1994, went back to [place], and then came back in 1998 and has been here since. She reported that she started learning English in 7th grade in [place], although it was more reading and writing and less conversational. She thinks she learned conversational the first time when she came here. She completed an associate's at [employer], then studied Investment banker, operational at [employer] in [place]. 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 She eventually earned a masters degree in Investment banker, operational at [employer]. 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 She was working until 2015 and retired to care for her husband.    Psychiatric History: The patient denied any psychiatric involvement.   Substance Use History: The patient drinks "once in a while" but not every night. She does not use nicotine. She smoked some in her 63s.   Relationship History and Living Cimcumstances: The patient has been married to her husband for 44 years. They have one child together, a son. He lives in the [place] area and would like to be involved, it sounds like he is busy with things and can't always be there. Her husband has three children from a previous marriage that are not involved.   Mental Status and Behavioral Observations  Sensorium/Arousal: The patient's level of arousal was awake and alert. Hearing and vision were adequate for testing purposes. Orientation: The patient was alert and fully oriented to person, place, time, and situation. Aware of the current  and past [place] presidents.  Appearance: Dressed in appropriate, casual clothing with good grooming and hygiene Behavior: Pleasant, appropriate Speech/language: The patient spoke with a thick [place] accent. She nevertheless seemed to comprehend every question asked by the examiner and spoke English quite well.  Gait/Posture: Normal on exam with Dr. Delice Lesch Movement: No bradykinesia, rest tremor, other concerning signs/symptoms Social Comportment: The patient was appropriate and pleasant Mood: Patient reports that she does well but admits to some stress  Affect: Mainly neutral Thought process/content: Thought process was logical, linear, and goal oriented. She was a detailed historian and had no difficulty whatsoever with her personal timeline and history.  Safety: No safety concerns identified in this euthymic patient Insight: Fair  Test Procedures  Wide Range Achievement Test - 4             Word Reading Neuropsychological Assessment Battery  Memory Module  Naming  Digit Span Repeatable Battery for the Assessment of Neuropsychological Status (Form A)  Figure Copy  Judgment of Line Orientation  Coding  Figure Recall The Dot Counting Test A Random Letter Test Controlled Oral Word Association (F-A-S) Semantic Fluency (Animals) Trail Making Test A & B Complex Ideational Material Modified [place] Card Sorting Test Geriatric Depression Scale - [place] was seen for a psychiatric diagnostic evaluation and neuropsychological testing. She is a 75 year old, right-hand dominant, [place] American woman with a history of HLD and concerns about memory and thinking. Her issues sound credible but fairly minor, in terms of having some day-to-day forgetfulness, difficulties with multi-tasking, feeling less sure with directions despite still being able to get places adequately. She presents as quite lucid today in clinic and her brain volume looks good on MRI. I think  testing will be helpful although I have cautioned her that we must proceed cautiously to avoid false positives given her non-native English speaking status. Full and complete note with impressions, recommendations, and interpretation of test data to follow.   Viviano Simas Nicole Kindred, PsyD, [place] Clinical Neuropsychologist  Informed Consent  Risks and benefits of the evaluation were discussed with the patient prior to all testing procedures. I conducted a clinical interview   with Seletha Y Sebastiani and Milana Kidney, B.S. (Technician) administered additional test procedures. The patient was able to tolerate the testing procedures and the patient (and/or family if applicable) is likely to benefit from further follow up to receive the diagnosis and treatment recommendations, which will be rendered at the next encounter.

## 2021-04-28 NOTE — Progress Notes (Signed)
[facility]  Patient Name: Kelsey [name] MRN: [id number] Date of Birth: 03-20-1946 Age: 75 y.o. Education: 18 years  Measurement properties of test scores: IQ, Index, and Standard Scores (SS): Mean = 100; Standard Deviation = 15 Scaled Scores (Ss): Mean = 10; Standard Deviation = 3 Z scores (Z): Mean = 0; Standard Deviation = 1 T scores (T); Mean = 50; Standard Deviation = 10  TEST SCORES:    Note: This summary of test scores accompanies the interpretive report and should not be interpreted by unqualified individuals or in isolation without reference to the report. Test scores are relative to age, gender, and educational history as available and appropriate.   Performance Validity        "A" Random Letter Test Raw  Descriptor      Errors 0 Within Expectation  The Dot Counting Test: 6 Within Expectation      Embedded Measures: Raw  Descriptor      NAB Effort Index 4 Below Expectation      Mental Status Screening     Total Score Descriptor  MoCA 29 Normal      Expected Functioning        Wide Range Achievement Test: Standard/Scaled Score Percentile      Word Reading 111 77      Reynolds Intellectual Screening Test Standard/T-score Percentile      Guess What 46 34      Odd Item Out 58 79  RIST Index 104 61      Attention/Processing Speed        Neuropsychological Assessment Battery (Attention Module, Form 1): Scaled/T-score Percentile      Digits Forward 28 2      Digits Backwards 40 16      Repeatable Battery for the Assessment of Neuropsychological Status (Form A): Standard Score Percentile     Coding 12 75      Language        Neuropsychological Assessment Battery (Language Module) T Score Percentile      Naming (31) 60 84      Verbal Fluency:  T Score Percentile      Controlled Oral Word Association (F-A-S) 44 27      Semantic Fluency (Animals) 51 54      Memory:        Neuropsychological Assessment Battery  (Memory Module, Form 1): T-score/Standard Score Percentile  Memory Index (MEM): 114 82      List Learning           List A Immediate Recall   (6 , 10 , 11) 56 73         List B Immediate Recall   (6) 58 79         List A Short Delayed Recall   (10) 62 88         List A Long Delayed Recall   (11) 68 96         List A Percent Retention   (110 %) --- 62         List A Long Delayed Yes/No Recognition Hits   (12) --- 79         List A Long Delayed Yes/No Recognition False Alarms   (0) --- 93         List A Recognition Discriminability Index --- 96      Shape Learning           Immediate Recognition   (5 ,  3 , 4) 40 16         Delayed Recognition   (5) 47 38         Percent Retention   (125 %) --- 75         Delayed Forced-Choice Recognition Hits   (6) --- 16         Delayed Forced-Choice Recognition False Alarms   (1) --- 54         Delayed Forced-Choice Recognition Discriminability --- 27      Story Learning           Immediate Recall   (31, 35) 52 58         Delayed Recall   (35) 52 58         Percent Retention   (100 %) --- 73      Daily Living Memory            Immediate Recall   (26, 24) 72 99          Delayed Recall   (9, 8) 61 86          Percent Retention (100 %) --- 84          Recognition Hits   (10) --- 86      Repeatable Battery for the Assessment of Neuropsychological Status (Form A): Scaled Score Percentile         Figure Recall   (10) 8 25      Visuospatial/Constructional Functioning        Repeatable Battery for the Assessment of Neuropsychological Status (Form A): Standard/Scaled Score Percentile      Visuospatial/Constructional Index 105 63         Figure Copy   (20) 14 91         Judgment of Line Orientation   (15) --- 26-50      Executive Functioning        Modified [employer] Test (MWCST): Standard/T-Score Percentile      Number of Categories Correct 55 69      Number of Perseverative Errors 62 88      Number of Total Errors 64 92      Percent  Perseverative Errors 62 88  Executive Function Composite 114 82      Trail Making Test: T-Score Percentile      Part A 53 62      Part B 52 58      Boston Diagnostic Aphasia Exam: Raw Score Scaled Score      Complex Ideational Material 10 7      Rating Scales        Clinical Dementia Rating Raw Score Descriptor      Sum of Boxes 0.5 Mild Cognitive Impairment      Global Score 0.5 MCI       Raw Score Descriptor  Geriatric Depression Scale - Short Form 8 Positive    Wannetta Langland V. Nicole Kindred PsyD, [place] Clinical Neuropsychologist

## 2021-05-01 ENCOUNTER — Other Ambulatory Visit: Payer: Self-pay | Admitting: Family Medicine

## 2021-05-01 ENCOUNTER — Encounter: Payer: Self-pay | Admitting: Counselor

## 2021-05-01 DIAGNOSIS — Z1231 Encounter for screening mammogram for malignant neoplasm of breast: Secondary | ICD-10-CM

## 2021-05-01 NOTE — Progress Notes (Signed)
[facility]  Patient Name: Kelsey [name] MRN: [id number] Date of Birth: Apr 19, 1946 Age: 75 y.o. Education: 75 years  Clinical Impressions  Kelsey [name] is a 75 y.o., right-hand dominant, married woman with a history of HLD and concerns about memory and thinking over the past several years. She was referred by Dr. Delice Lesch for neuropsychological evaluation in the service of diagnostic clarification. My thanks to Dr. Delice Lesch for her characteristically detailed and helpful notes. On interview, Ms. Bandel reported minor cognitive problems including word finding problems, difficulties with organization, and losing her train of thought, which became concerning to her about 3 years ago. She does admit to significant caregiver stress, her husband has severe dementia and she is his sole caretaker at home. She has access to social support but cannot often use it related to her care giving responsibilities.    This is a normal neuropsychological study. Specifically, Ms. Shoe demonstrated normal range scores in all domains with high average overall performance on memory measures and no low scores. This is particularly impressive given her status as a non-native [place] speaker, would be expected to reduce her scores to some extent. She had mainly normal performance on measures of executive abilities, processing speed, visuospatial/constructional and language functioning. She did have an isolated low score on digit repetition forward but then did well on digit repetition backward, which is a more challenging measure. Her performance on memory measures involving attention certainly suggests very good abilities. She screened positive for the presence of depression on self-report.   Overall this is a very reassuring cognitive study. While normal neuropsychological testing does not entirely rule out the possibility of some cognitive problems, it provides very  strong evidence against an impression of a clinically evident cognitive disorder. I question if depression, anxiety about dementia given her personal experience with the condition, caregiver burnout and the like are not responsible for her perception of day-to-day cognitive symptoms. She will be reassured and I will also discuss primary prevention and self-caregiving with her.   Diagnostic Impressions: Cognitive complaints with normal neuropsychological exam  Recommendations to be discussed with patient  Your performance and presentation on neuropsychological assessment were consistent with normal performance. Specifically, you did not demonstrate any patterns of performance concerning for impairment in any area. I would like to highlight that you actually did very well on neuropsychological testing, with high average overall memory performance, which is particularly impressive given your non-native English speaking status. These findings provide strong reassurance about your cognitive functioning.  Normal neuropsychological test performance does not entirely rule out the possibility of some cognitive problems; however, it does provide strong evidence against an impression of any diagnosable neuropsychological disorder. I would like to therefore reassure you that your cognitive performance is normal, at least as per your neuropsychological test performance.  Caregiver burn out refers to increased stress, mood problems, and other difficulties that are seen frequently in individuals who care for others. Caregiver burnout can lead to depression. It is also self-defeating, because it can interfere with a caregiver's ability to care for others. There is research that mortality in spouses who provide care to a demented loved one may be higher than for the individual with dementia themselves. For all these reasons, I recommend that you to actively manage your stress level and take as good care of yourself as you  do of your loved one. Strategies that may help include:  Splitting caregiving responsibilities amongst multiple family members. Getting paid supports so that  you can take some time to yourself. Aggressively treating depression and anxiety that are understandable reactions to the stress of caregiving. This may include counseling or antidepressant medication depending on your symptoms and treatment of choice.  Even something simple such as scheduling small periods of time every day where you can engage in a desired hobby or pleasurable activity can be extremely helpful.   If you are worried about the possibility of dementia in the future, you may be pleased to know that lifestyle changes such as diet and exercise have been shown to meaningfully reduce dementia risk. Given your educational background and interest in nutrition, you may find the MRI and the diet to be particularly of interest. I provide brief information about the MI and the diet below, although the author, Dr. Ginette Otto, recently came out with the full length book further detailing the diet, including recipes to cook.  There is now good quality evidence from at least one large scale study that a modified mediterranean diet may help slow cognitive decline. This is known as the "MIND" diet. The Mind diet is not so much a specific diet as it is a set of recommendations for things that you should and should not eat.   Foods that are ENCOURAGED on the MIND Diet:  Green, leafy vegetables: Aim for six or more servings per week. This includes kale, spinach, cooked greens and salads.  All other vegetables: Try to eat another vegetable in addition to the green leafy vegetables at least once a day. It is best to choose non-starchy vegetables because they have a lot of nutrients with a low number of calories.  Berries: Eat berries at least twice a week. There is a plethora of research on strawberries, and other berries such as blueberries,  raspberries and blackberries have also been found to have antioxidant and brain health benefits.  Nuts: Try to get five servings of nuts or more each week. The creators of the [place] don't specify what kind of nuts to consume, but it is probably best to vary the type of nuts you eat to obtain a variety of nutrients. Peanuts are a legume and do not fall into this category.  Olive oil: Use olive oil as your main cooking oil. There may be other heart-healthy alternatives such as algae oil, though there is not yet sufficient research upon which to base a formal recommendation.  Whole grains: Aim for at least three servings daily. Choose minimally processed grains like oatmeal, quinoa, brown rice, whole-wheat pasta and 100% whole-wheat bread.  Fish: Eat fish at least once a week. It is best to choose fatty fish like salmon, sardines, trout, tuna and mackerel for their high amounts of omega-3 fatty acids.  Beans: Include beans in at least four meals every week. This includes all beans, lentils and soybeans.  Poultry: Try to eat chicken or [place] at least twice a week. Note that fried chicken is not encouraged on the MIND diet.  Wine: Aim for no more than one glass of alcohol daily. Both red and white wine may benefit the brain. However, much research has focused on the red wine compound resveratrol, which may help protect against Alzheimer's disease.  Foods that are DISCOURAGED on the MIND Diet: Butter and margarine: Try to eat less than 1 tablespoon (about 14 grams) daily. Instead, try using olive oil as your primary cooking fat, and dipping your bread in olive oil with herbs.  Cheese: The MIND diet recommends limiting your  cheese consumption to less than once per week.  Red meat: Aim for no more than three servings each week. This includes all beef, pork, lamb and products made from these meats.  Maceo Pro food: The MIND diet highly discourages fried food, especially the kind from fast-food restaurants.  Limit your consumption to less than once per week.  Pastries and sweets: This includes most of the processed junk food and desserts you can think of. Ice cream, cookies, brownies, snack cakes, donuts, candy and more. Try to limit these to no more than four times a week.  Of course, if you feel that you are declining in the future and that your issues are getting worse, it would be reasonable to return for further assessment. I would like to highlight that at this point, that is not my expectation, and I think that you are aging very gracefully and doing quite well with respect to cognitive abilities.  Test Findings  Test scores are summarized in additional documentation associated with this encounter. Test scores are relative to age, gender, and educational history as available and appropriate. There were no concerns about performance validity. Performance was low on one embedded indicator, but she did well on 2 other stand-alone indicators, which is not concerning. I would also note that all of her test scores are essentially within normal limits besides one low score, which is the reason why this embedded indicator was low in the first place.  General Intellectual Functioning/Achievement:  Performance on single word reading was high average. Performance on the RIST index was average, with an average score on the verbally mediated portion and a high average score on the more visually oriented portion of the test.  Attention and Processing Efficiency: Performance of indicators of attention and processing efficiency was generally good, with the exception of digit repetition forward which was extremely low. This score does not seem very meaningful in the context of low average performance on digit repetition backward, which is a more challenging measure. Timed number-symbol coding was average.  Language: Performance on language measures was good, which is truly impressive in this nonnative [place]  speaker. Visual object confrontation naming was high average. Performance was average on phonemic and semantic measures of Controlled Oral Word Association.  Visuospatial Function: Visuospatial/constructional functioning fell at an average level overall on the RBANS index. Figure copy was errorless and in the superior range. Judgment of angular line orientations was average.  Learning and Memory: Performance on measures of learning and memory was very good, with her overall NAB memory index score falling at a high average level. Good capacity for acquisition and retention of information was demonstrated across time and both verbal and visual modalities.  In the verbal realm, she demonstrated average levels of immediate recall for a 12-item word list and brief short story. Following a standard delay, recall for the word list was superior and her recall for the short story was average, with perfect or better retention in both cases. Delayed yes/no recognition for the words from the list versus false choices was errorless, in the superior range. Memory for brief, daily-living type information was very superior on immediate recall and high average on delayed recall. Recognition discrimination for this information was high average.  In the visual realm, immediate recall for a series of designs that is difficult to verbally encode was 5, 3, and 4 designs of a possible 9 across 3 learning trials, which is low average. Retention for these designs was high average, with average delayed  recognition memory. Yes/no recognition discriminability for the designs was average. Her delayed free recall for modestly complex figure stimulus was average.  Executive Functions: Performance on the executive function composition of the modified [place] card sorting test was high average, with an average categories correct score and very good high average (nearly superior) performance with respect to perseverative errors.  Alternating sequencing of numbers and letters of the alphabet was average. Answering a series of yes/no questions on the complex ideational material was low average. Generation of words in response to the letters F-A-S was average.  Rating Scale(s): Ms. Brecht screened positive for the presence of depression on self-report rating.  Viviano Simas Nicole Kindred, PsyD, ABN Clinical Neuropsychologist  Coding and Compliance  Billing below reflects technician time, my direct face-to-face time with the patient, time spent in test administration, and time spent in professional activities including but not limited to: neuropsychological test interpretation, integration of neuropsychological test data with clinical history, report preparation, treatment planning, care coordination, and review of diagnostically pertinent medical history or studies.   Services associated with this encounter: Clinical Interview [phone]) plus 155 minutes ([id number]; Neuropsychological Evaluation by Professional)  110 minutes ([id number]; Neuropsychological Testing by Technician)

## 2021-05-02 ENCOUNTER — Ambulatory Visit (INDEPENDENT_AMBULATORY_CARE_PROVIDER_SITE_OTHER): Payer: Medicare PPO | Admitting: Counselor

## 2021-05-02 ENCOUNTER — Other Ambulatory Visit: Payer: Self-pay

## 2021-05-02 ENCOUNTER — Encounter: Payer: Medicare PPO | Admitting: Counselor

## 2021-05-02 DIAGNOSIS — F418 Other specified anxiety disorders: Secondary | ICD-10-CM | POA: Diagnosis not present

## 2021-05-02 NOTE — Patient Instructions (Signed)
Your performance and presentation on neuropsychological assessment were consistent with normal performance. Specifically, you did not demonstrate any patterns of performance concerning for impairment in any area. I would like to highlight that you actually did very well on neuropsychological testing, with high average overall memory performance, which is particularly impressive given your non-native English speaking status. These findings provide strong reassurance about your cognitive functioning.  Normal neuropsychological test performance does not entirely rule out the possibility of some cognitive problems; however, it does provide strong evidence against an impression of any diagnosable neuropsychological disorder. I would like to therefore reassure you that your cognitive performance is normal, at least as per your neuropsychological test performance.  Caregiver burn out refers to increased stress, mood problems, and other difficulties that are seen frequently in individuals who care for others. Caregiver burnout can lead to depression. It is also self-defeating, because it can interfere with a caregiver's ability to care for others. There is research that mortality in spouses who provide care to a demented loved one may be higher than for the individual with dementia themselves. For all these reasons, I recommend that you to actively manage your stress level and take as good care of yourself as you do of your loved one. Strategies that may help include:   Splitting caregiving responsibilities amongst multiple family members. Getting paid supports so that you can take some time to yourself. Aggressively treating depression and anxiety that are understandable reactions to the stress of caregiving. This may include counseling or antidepressant medication depending on your symptoms and treatment of choice.  Even something simple such as scheduling small periods of time every day where you can engage in a  desired hobby or pleasurable activity can be extremely helpful.   If you are worried about the possibility of dementia in the future, you may be pleased to know that lifestyle changes such as diet and exercise have been shown to meaningfully reduce dementia risk. Given your educational background and interest in nutrition, you may find the MRI and the diet to be particularly of interest. I provide brief information about the MI and the diet below, although the author, Dr. Ginette Otto, recently came out with the full length book further detailing the diet, including recipes to cook.   There is now good quality evidence from at least one large scale study that a modified mediterranean diet may help slow cognitive decline. This is known as the "MIND" diet. The Mind diet is not so much a specific diet as it is a set of recommendations for things that you should and should not eat.   Foods that are ENCOURAGED on the MIND Diet:  Green, leafy vegetables: Aim for six or more servings per week. This includes kale, spinach, cooked greens and salads.  All other vegetables: Try to eat another vegetable in addition to the green leafy vegetables at least once a day. It is best to choose non-starchy vegetables because they have a lot of nutrients with a low number of calories.  Berries: Eat berries at least twice a week. There is a plethora of research on strawberries, and other berries such as blueberries, raspberries and blackberries have also been found to have antioxidant and brain health benefits.  Nuts: Try to get five servings of nuts or more each week. The creators of the MIND diet don't specify what kind of nuts to consume, but it is probably best to vary the type of nuts you eat to obtain a variety  of nutrients. Peanuts are a legume and do not fall into this category.  Olive oil: Use olive oil as your main cooking oil. There may be other heart-healthy alternatives such as algae oil, though there is  not yet sufficient research upon which to base a formal recommendation.  Whole grains: Aim for at least three servings daily. Choose minimally processed grains like oatmeal, quinoa, brown rice, whole-wheat pasta and 100% whole-wheat bread.  Fish: Eat fish at least once a week. It is best to choose fatty fish like salmon, sardines, trout, tuna and mackerel for their high amounts of omega-3 fatty acids.  Beans: Include beans in at least four meals every week. This includes all beans, lentils and soybeans.  Poultry: Try to eat chicken or [place] at least twice a week. Note that fried chicken is not encouraged on the MIND diet.  Wine: Aim for no more than one glass of alcohol daily. Both red and white wine may benefit the brain. However, much research has focused on the red wine compound resveratrol, which may help protect against Alzheimer's disease.  Foods that are DISCOURAGED on the MIND Diet: Butter and margarine: Try to eat less than 1 tablespoon (about 14 grams) daily. Instead, try using olive oil as your primary cooking fat, and dipping your bread in olive oil with herbs.  Cheese: The MIND diet recommends limiting your cheese consumption to less than once per week.  Red meat: Aim for no more than three servings each week. This includes all beef, pork, lamb and products made from these meats.  Maceo Pro food: The MIND diet highly discourages fried food, especially the kind from fast-food restaurants. Limit your consumption to less than once per week.  Pastries and sweets: This includes most of the processed junk food and desserts you can think of. Ice cream, cookies, brownies, snack cakes, donuts, candy and more. Try to limit these to no more than four times a week.   Of course, if you feel that you are declining in the future and that your issues are getting worse, it would be reasonable to return for further assessment. I would like to highlight that at this point, that is not my expectation, and I  think that you are aging very gracefully and doing quite well with respect to cognitive abilities.

## 2021-05-02 NOTE — Progress Notes (Signed)
NEUROPSYCHOLOGY FEEDBACK NOTE [facility]  Feedback Note: I met with Kelsey [name] to review the findings resulting from her neuropsychological evaluation. Since the last appointment, she has been about the same.Time was spent reviewing the impressions and recommendations that are detailed in the evaluation report. We discussed impression of normal cognitive performance, as reflected in the patient instructions. I took time to explain the findings and answer all the patient's questions. I encouraged Kelsey [name] to contact me should she have any further questions or if further follow up is desired.   Current Medications and Medical History   Current Outpatient Medications  Medication Sig Dispense Refill   atorvastatin (LIPITOR) 10 MG tablet Take 1 tablet (10 mg total) by mouth every evening. 90 tablet 3   Cyanocobalamin (VITAMIN B-12) 500 MCG SUBL 1 tab placed under the tongue daily (Patient taking differently: 625 mcg. 1 tab placed under the tongue daily; 5000 mcg cut into 8th) 90 tablet 3   HYDROcodone-acetaminophen (NORCO) 5-325 MG tablet 1 tab po q6 hours prn pain 20 tablet 0   levofloxacin (LEVAQUIN) 500 MG tablet Take 1 tablet (500 mg total) by mouth daily. 7 tablet 0   metroNIDAZOLE (FLAGYL) 500 MG tablet Take 1 tablet (500 mg total) by mouth 2 (two) times daily. 14 tablet 0   mupirocin cream (BACTROBAN) 2 % Apply 1 application topically 2 (two) times daily. 15 g 0   omeprazole (PRILOSEC) 40 MG capsule Take 1 capsule (40 mg total) by mouth daily. 90 capsule 1   Probiotic Product (ALIGN EXTRA STRENGTH) CAPS Take 1 capsule by mouth 4 (four) times daily. 120 capsule 0   sucralfate (CARAFATE) 1 g tablet TAKE 1 TABLET(1 GRAM) BY MOUTH FOUR TIMES DAILY AT BEDTIME WITH MEALS 120 tablet 5   Vitamin D, Ergocalciferol, (DRISDOL) 1.25 MG (50000 UNIT) CAPS capsule Take 1 capsule (50,000 Units total) by mouth every 7 (seven) days. 12 capsule 3   No current facility-administered  medications for this visit.    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   De Quervain's tenosynovitis, left 12/11/2017   Nonallopathic lesion of lumbosacral region 01/31/2017   Nonallopathic lesion of sacral region 01/03/2017   Nonallopathic lesion of thoracic region 01/03/2017   Cervical somatic dysfunction 12/07/2016   Pelvic somatic dysfunction 12/07/2016   SI (sacroiliac) joint dysfunction 12/07/2016   Abnormal mini-mental status exam 07/05/2016   Leg length discrepancy 01/25/2014   History of iron deficiency anemia 12/02/2013   Caregiver stress 12/02/2013   Vitamin D deficiency 12/03/2012   Fibrocystic breast 09/14/2011   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 Speech was normal in rate, rhythm, volume, and prosody. Self-reported mood was "fine" and affect was neutral to somewhat somber. Thought process was logical and goal orietned and thought content was appropriate to the topics discused. There were no safety concerns identified at today's encounter, such as thoughts of harming self or others.   Plan  Feedback provided regarding the patient's neuropsychological evaluation. She is very very well from a cognitive perspective. She seems to have very high standards for herself. Her high average performance on memory measures overall was particularly impressive given that  she is a non-native [place] speaker, which I shared with her. I encouraged her to seek treatment for depression and also consider getting some help for care giving with her husband. Kelsey [name] was encouraged to contact me if any questions arise or if further  follow up is desired.   Kelsey [name] Kelsey Kindred, PsyD, ABN Clinical Neuropsychologist  Service(s) Provided at This Encounter: 28 minutes [phone]; Psychotherapy with patient/family)

## 2021-05-03 IMAGING — XA Imaging study
2 series · 2 of 2 positions shown · non-contrast
Comparison: none

CLINICAL DATA: Lumbosacral spondylosis without myelopathy. Right
greater than left low back pain.

[Series 1: ortho adipose · 1 of 1 slices shown (1 of 2)]
[im 1/1]
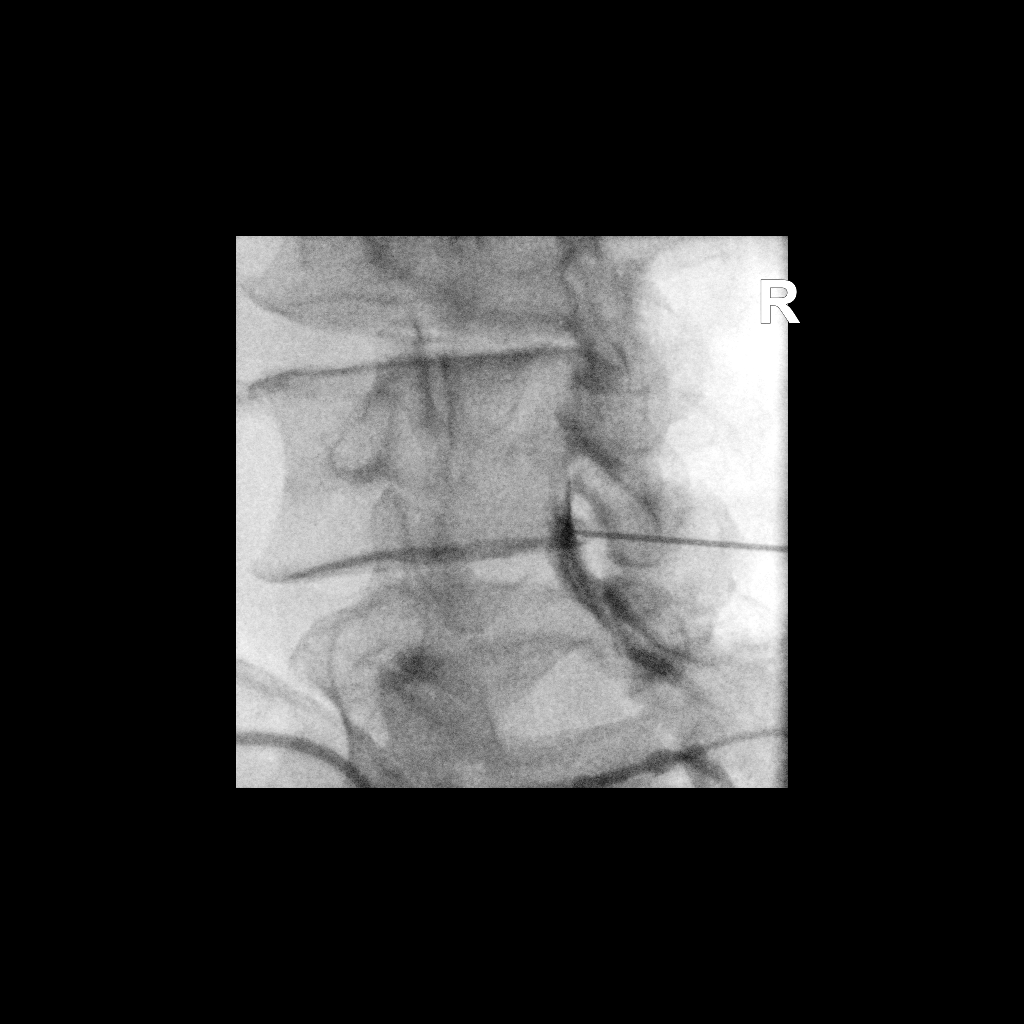

[Series 3: ortho adipose · 1 of 1 slices shown (2 of 2)]
[im 1/1]
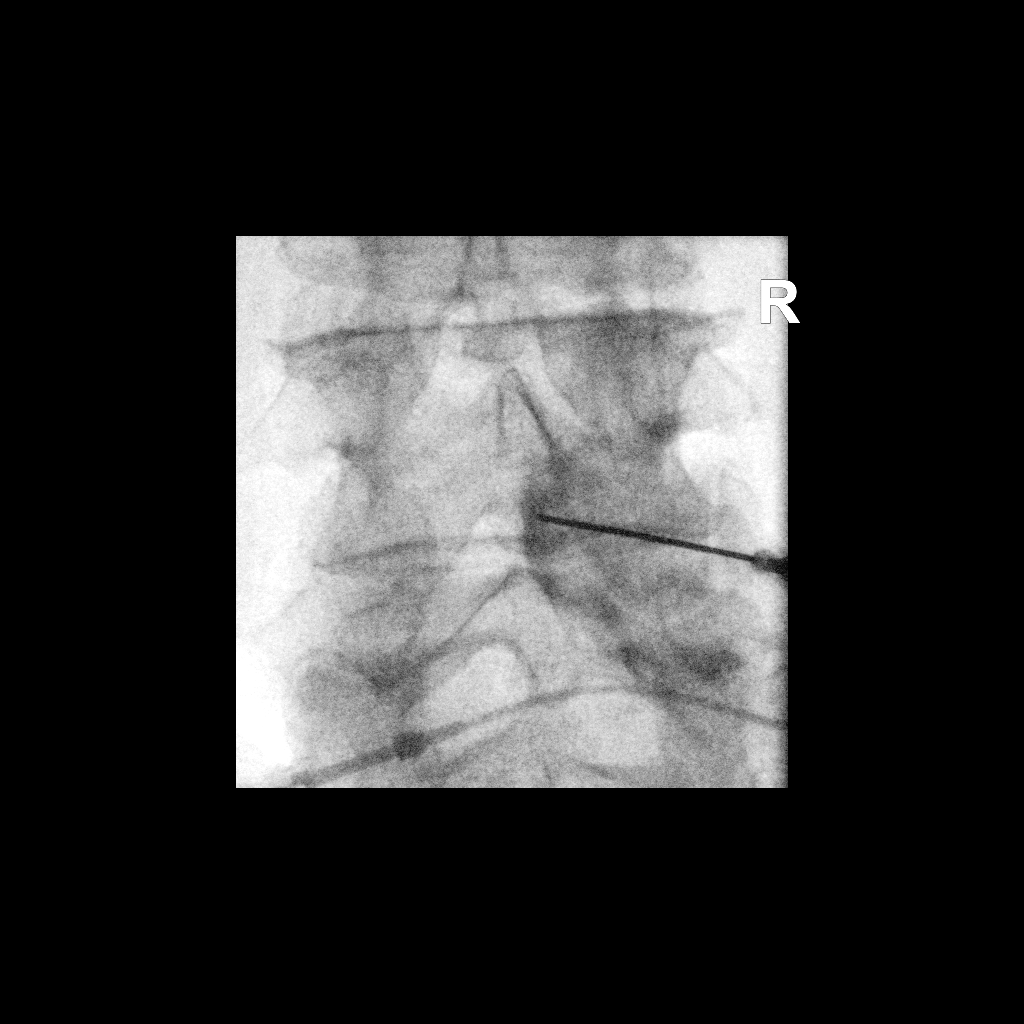

[2 of 2 positions shown; findings below may reference images not displayed]

FLUOROSCOPY TIME:  Fluoroscopy Time: 4 seconds

Radiation Exposure Index: 4.50 microGray*m^2

PROCEDURE:
The procedure, risks, benefits, and alternatives were explained to
the patient. Questions regarding the procedure were encouraged and
answered. The patient understands and consents to the procedure.

LUMBAR EPIDURAL INJECTION:

An interlaminar approach was performed on the right at L4-5. The
overlying skin was cleansed and anesthetized. A 3.5 inch 20 gauge
epidural needle was advanced using loss-of-resistance technique.

DIAGNOSTIC EPIDURAL INJECTION:

Injection of Isovue-M 200 shows a good epidural pattern with spread
above and below the level of needle placement, primarily on the
right. No vascular opacification is seen.

THERAPEUTIC EPIDURAL INJECTION:

120 mg of Depo-Medrol mixed with 3 mL of 1% lidocaine were
instilled. The procedure was well-tolerated, and the patient was
discharged thirty minutes following the injection in good condition.

COMPLICATIONS:
None
IMPRESSION: Technically successful lumbar interlaminar epidural injection on the
right at L4-5.

## 2021-05-08 ENCOUNTER — Encounter: Payer: Self-pay | Admitting: Family Medicine

## 2021-05-08 ENCOUNTER — Other Ambulatory Visit: Payer: Self-pay

## 2021-05-08 ENCOUNTER — Ambulatory Visit (INDEPENDENT_AMBULATORY_CARE_PROVIDER_SITE_OTHER): Payer: Medicare PPO | Admitting: Family Medicine

## 2021-05-08 VITALS — BP 101/66 | HR 84 | Temp 98.3°F | Ht 65.0 in | Wt 107.0 lb

## 2021-05-08 DIAGNOSIS — M19042 Primary osteoarthritis, left hand: Secondary | ICD-10-CM | POA: Diagnosis not present

## 2021-05-08 DIAGNOSIS — M189 Osteoarthritis of first carpometacarpal joint, unspecified: Secondary | ICD-10-CM

## 2021-05-08 DIAGNOSIS — Z23 Encounter for immunization: Secondary | ICD-10-CM | POA: Diagnosis not present

## 2021-05-08 DIAGNOSIS — M19041 Primary osteoarthritis, right hand: Secondary | ICD-10-CM | POA: Diagnosis not present

## 2021-05-08 DIAGNOSIS — K219 Gastro-esophageal reflux disease without esophagitis: Secondary | ICD-10-CM

## 2021-05-08 DIAGNOSIS — M6530 Trigger finger, unspecified finger: Secondary | ICD-10-CM

## 2021-05-08 DIAGNOSIS — M19049 Primary osteoarthritis, unspecified hand: Secondary | ICD-10-CM

## 2021-05-08 DIAGNOSIS — R1904 Left lower quadrant abdominal swelling, mass and lump: Secondary | ICD-10-CM | POA: Diagnosis not present

## 2021-05-08 MED ORDER — ATORVASTATIN CALCIUM 10 MG PO TABS: 10.0000 mg | ORAL_TABLET | Freq: Every evening | ORAL | 3 refills | Status: DC

## 2021-05-08 MED ORDER — OMEPRAZOLE 40 MG PO CPDR: 40.0000 mg | DELAYED_RELEASE_CAPSULE | Freq: Every day | ORAL | 1 refills | Status: DC

## 2021-05-08 NOTE — Patient Instructions (Addendum)
Aspercreme or capsaicin cream for your hands can be helpful.  Trigger finger can be injected or surgically corrected.   Nasal saline washes once daily Start oral antihistamine daily.  If continue, could consider antibiotic, but you do not appear infectious today.

## 2021-05-08 NOTE — Progress Notes (Signed)
This visit occurred during the SARS-CoV-2 public health emergency.  Safety protocols were in place, including screening questions prior to the visit, additional usage of staff PPE, and extensive cleaning of exam room while observing appropriate contact time as indicated for disinfecting solutions.    Kelsey [name] , [birth year], 75 y.o., female MRN: [id number] Patient Care Team    Relationship Specialty Notifications Start End  [name], DO PCP - General Family Medicine  05/27/15   [name], MD Consulting Physician Gastroenterology  12/08/15   Calvert Cantor, MD Consulting Physician Ophthalmology  12/08/15   Lyndal Pulley, DO Consulting Physician Family Medicine  04/07/18   Cameron Sprang, MD Consulting Physician Neurology  01/23/21     Chief Complaint  Patient presents with   bulge    Pt reports bulge on left side of umbilical x 4 weeks; denies pain     Subjective: Pt presents for an OV with multiple complaints of  Primary osteoarthritis of both hands/CMC arthritis/Trigger finger, acquired  Pt reports her hands are hurting. She reports it is hard to turn a door know sometimes. Pain is located in knuckles and cmc joints. She had a work up for [employer] d/o which were normal. Nor al sed and crp.   Left lower quadrant abdominal mass Pt reports last 4 weeks she has noticed a small hard mass on the left side of her umbilicus. She denies any pain, fever or changes in BM.  Gastroesophageal reflux disease without esophagitis Stable on prilosec.   Depression screen [facility] 2/9 05/08/2021 08/19/2020 08/17/2020 03/02/2020 12/26/2018  Decreased Interest 0 0 0 1 0  Down, Depressed, Hopeless 0 1 0 0 0  PHQ - 2 Score 0 1 0 1 0  Altered sleeping - 0 - - -  Tired, decreased energy - 3 - - -  Change in appetite - 0 - - -  Feeling bad or failure about yourself  - 1 - - -  Trouble concentrating - 2 - - -  Moving slowly or fidgety/restless - 0 - - -  Suicidal thoughts - 0 - - -  PHQ-9 Score - 7  - - -  Some recent data might be hidden    Allergies  Allergen Reactions   Penicillins Rash    Has patient had a PCN reaction causing immediate rash, facial/tongue/throat swelling, SOB or lightheadedness with hypotension: no Has patient had a PCN reaction causing severe rash involving mucus membranes or skin necrosis: yes Has patient had a PCN reaction that required hospitalization: no Has patient had a PCN reaction occurring within the last 10 years: no If all of the above answers are "NO", then may proceed with Cephalosporin use.    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    Uterine fibroid 11/2012   See Dr. Cletis Media note, surgery 11/2012; calcified- multiple   Vaginitis 09/13/2011   Past Surgical History:  Procedure Laterality Date   DILATATION & CURRETTAGE/HYSTEROSCOPY WITH RESECTOCOPE N/A 11/06/2012   Procedure: Cornel[place];  Surgeon: Alwyn Pea, MD;  Location: [facility];  Service: Gynecology;  Laterality: N/A;   HERNIA REPAIR  1992   I & D EXTREMITY Left 03/08/2021   Procedure: IRRIGATION AND DEBRIDEMENT OF THUMB AND ARM;  Surgeon: Leanora Cover, MD;  Location: [facility];  Service: Orthopedics;   Laterality: Left;   small intestine hernia  1994   WISDOM TOOTH EXTRACTION     Family History  Problem Relation Age of Onset   Heart disease Mother    Osteoporosis Mother    Cancer Father        prostate   Osteoporosis Sister    Gout Brother    Gout Maternal Grandmother    Heart disease Maternal Grandmother    Alzheimer's disease Maternal Grandfather    Osteoporosis Sister    Allergies as of 05/08/2021       Reactions   Penicillins Rash   Has patient had a PCN reaction causing immediate rash, facial/tongue/throat swelling, SOB or lightheadedness with hypotension: no Has patient had a PCN reaction causing severe rash involving mucus membranes or skin necrosis: yes Has patient had a PCN reaction that required hospitalization: no Has patient had a PCN reaction occurring within the last 10 years: no If all of the above answers are "NO", then may proceed with Cephalosporin use.   Sulfa Antibiotics Rash   Under arm and thighs        Medication List        Accurate as of May 08, 2021  9:42 AM. If you have any questions, ask your nurse or doctor.          Align Extra Strength Caps Take 1 capsule by mouth 4 (four) times daily.   atorvastatin 10 MG tablet Commonly known as: LIPITOR Take 1 tablet (10 mg total) by mouth every evening.   HYDROcodone-acetaminophen 5-325 MG tablet Commonly known as: Norco 1 tab po q6 hours prn pain   levofloxacin 500 MG tablet Commonly known as: LEVAQUIN Take 1 tablet (500 mg total) by mouth daily.   metroNIDAZOLE 500 MG tablet Commonly known as: FLAGYL Take 1 tablet (500 mg total) by mouth 2 (two) times daily.   mupirocin cream 2 % Commonly known as: BACTROBAN Apply 1 application topically 2 (two) times daily.   omeprazole 40 MG capsule Commonly known as: PRILOSEC Take 1 capsule (40 mg total) by mouth daily.   sucralfate 1 g tablet Commonly known as: CARAFATE TAKE 1 TABLET(1 GRAM) BY MOUTH FOUR TIMES DAILY AT BEDTIME WITH  MEALS   Vitamin B-12 500 MCG Subl 1 tab placed under the tongue daily What changed:  how much to take additional instructions   Vitamin D (Ergocalciferol) 1.25 MG (50000 UNIT) Caps capsule Commonly known as: DRISDOL Take 1 capsule (50,000 Units total) by mouth every 7 (seven) days.        All past medical history, surgical history, allergies, family history, immunizations andmedications were updated in the EMR today and reviewed under the history and medication portions of  their EMR.     ROS: Negative, with the exception of above mentioned in HPI   Objective:  BP 101/66   Pulse 84   Temp 98.3 F (36.8 C) (Oral)   Ht 5\' 5"  (1.651 m)   Wt 107 lb (48.5 kg)   SpO2 97%   BMI 17.81 kg/m  Body mass index is 17.81 kg/m. Gen: Afebrile. No acute distress. Nontoxic in appearance, well developed, well nourished.  HENT: AT. [place].  Eyes:Pupils Equal Round Reactive to light, Extraocular movements intact,  Conjunctiva without redness, discharge or icterus. Abd: Soft. flat. NTND. BS present. no Masses palpated. No rebound or guarding.  MSK: bilateral hands w/  Skin: no rashes, purpura or petechiae.  Neuro:  Normal gait. PERLA. EOMi. Alert. Oriented x3  Psych: Normal affect, dress and demeanor. Normal speech. Normal thought content and judgment.  No results found. No results found. No results found for this or any previous visit (from the past 24 hour(s)).  Assessment/Plan: KYASIA STEUCK is a 74 y.o. female present for OV for  Need for influenza vaccination - Flu Vaccine QUAD High Dose(Fluad)  Primary osteoarthritis of both hands/CMC arthritis/Trigger finger, acquired Discussed options for tx w/ her today including OTC topicals, oral prescribed meds etc. She does has a h/o GERD. Caution on nsaids use.  She declines prescribed meds at this time. She will try aspercreme.   Left lower quadrant abdominal mass Exam is normal today. She admits she also was unable to  palpate area  today.  She denies any pain or bowel habits associated.   Gastroesophageal reflux disease without esophagitis Stable.  Refill provided on prilosec.   Reviewed expectations re: course of current medical issues. Discussed self-management of symptoms. Outlined signs and symptoms indicating need for more acute intervention. Patient verbalized understanding and all questions were answered. Patient received an After-Visit Summary.    Orders Placed This Encounter  Procedures   Flu Vaccine QUAD High Dose(Fluad)   No orders of the defined types were placed in this encounter.  Referral Orders  No referral(s) requested today     Note is dictated utilizing voice recognition software. Although note has been proof read prior to signing, occasional typographical errors still can be missed. If any questions arise, please do not hesitate to call for verification.   electronically signed by:  Howard Pouch, DO  [place]

## 2021-05-16 ENCOUNTER — Other Ambulatory Visit: Payer: Self-pay

## 2021-05-16 ENCOUNTER — Ambulatory Visit: Payer: Medicare PPO | Admitting: Family Medicine

## 2021-05-16 ENCOUNTER — Ambulatory Visit (INDEPENDENT_AMBULATORY_CARE_PROVIDER_SITE_OTHER): Payer: Medicare PPO | Admitting: Sports Medicine

## 2021-05-16 VITALS — BP 100/64 | HR 87 | Ht 65.0 in | Wt 108.0 lb

## 2021-05-16 DIAGNOSIS — M9901 Segmental and somatic dysfunction of cervical region: Secondary | ICD-10-CM

## 2021-05-16 DIAGNOSIS — M9908 Segmental and somatic dysfunction of rib cage: Secondary | ICD-10-CM | POA: Diagnosis not present

## 2021-05-16 DIAGNOSIS — M9902 Segmental and somatic dysfunction of thoracic region: Secondary | ICD-10-CM

## 2021-05-16 DIAGNOSIS — M9903 Segmental and somatic dysfunction of lumbar region: Secondary | ICD-10-CM | POA: Diagnosis not present

## 2021-05-16 DIAGNOSIS — M545 Low back pain, unspecified: Secondary | ICD-10-CM

## 2021-05-16 DIAGNOSIS — M9905 Segmental and somatic dysfunction of pelvic region: Secondary | ICD-10-CM | POA: Diagnosis not present

## 2021-05-16 DIAGNOSIS — G8929 Other chronic pain: Secondary | ICD-10-CM

## 2021-05-16 NOTE — Patient Instructions (Signed)
Good to see you   Follow up in 4-6 weeks for repeat OMT 

## 2021-05-16 NOTE — Progress Notes (Signed)
   Kelsey [name] D.[place] Phone: [phone]   Assessment and Plan:     1. Chronic right-sided low back pain without sciatica 2. Somatic dysfunction of cervical region 3. Somatic dysfunction of lumbar region 4. Somatic dysfunction of thoracic region 5. Somatic dysfunction of pelvic region 6. Somatic dysfunction of rib region -Chronic with exacerbation, subsequent visit - Recurrence of typical musculoskeletal complaints with worst being right lower back without radiation - Patient elected for repeat OMT as she has had significant relief in the past.  Tolerated well per note below   Decision today to treat with OMT was based on Physical Exam   After verbal consent patient was treated with HVLA (high velocity low amplitude), ME (muscle energy), FPR (flex positional release), ST (soft tissue), PC/PD (Pelvic Compression/ Pelvic Decompression) techniques in cervical, rib, thoracic, lumbar, and pelvic areas. Patient tolerated the procedure well with improvement in symptoms.  Patient educated on potential side effects of soreness and recommended to rest, hydrate, and use Tylenol as needed for pain control.   Pertinent previous records reviewed include none   Follow Up: 4 weeks for repeat OMT   Subjective:   I, Kelsey [name], am serving as a scribe for Dr. Glennon Mac  Chief Complaint: Neck and back pain   HPI:   05/16/21 Patient is a 75 year old female presenting with neck and back pain. Patient was last seen by Dr. Tamala Julian on 04/18/21 for this reason and had OMT. Today patient reports that her lower back is not the worst it has been but not the greatest. States tight and sore and stiff.  Relevant Historical Information: None pertinent  Additional pertinent review of systems negative.  Current Outpatient Medications  Medication Sig Dispense Refill   atorvastatin (LIPITOR) 10 MG tablet Take 1 tablet (10 mg total) by  mouth every evening. 90 tablet 3   Cyanocobalamin (VITAMIN B-12) 500 MCG SUBL 1 tab placed under the tongue daily (Patient taking differently: 625 mcg. 1 tab placed under the tongue daily; 5000 mcg cut into 8th) 90 tablet 3   omeprazole (PRILOSEC) 40 MG capsule Take 1 capsule (40 mg total) by mouth daily. 90 capsule 1   Probiotic Product (ALIGN EXTRA STRENGTH) CAPS Take 1 capsule by mouth 4 (four) times daily. 120 capsule 0   sucralfate (CARAFATE) 1 g tablet TAKE 1 TABLET(1 GRAM) BY MOUTH FOUR TIMES DAILY AT BEDTIME WITH MEALS 120 tablet 5   Vitamin D, Ergocalciferol, (DRISDOL) 1.25 MG (50000 UNIT) CAPS capsule Take 1 capsule (50,000 Units total) by mouth every 7 (seven) days. 12 capsule 3   No current facility-administered medications for this visit.      Objective:     Vitals:   05/16/21 1314  BP: 100/64  Pulse: 87  SpO2: 96%  Weight: 108 lb (49 kg)  Height: 5\' 5"  (1.651 m)      Body mass index is 17.97 kg/m.    Physical Exam:     General: Well-appearing, cooperative, sitting comfortably in no acute distress.   OMT Physical Exam:  ASIS Compression Test: Positive Right Cervical: TTP paraspinal, C3 RRSL Rib: Bilateral elevated first rib with TTP Thoracic: TTP paraspinal, T4-7 RRSL Lumbar: TTP paraspinal, L1-3 RRSL Pelvis: Right anterior innominate  Electronically signed by:  Kelsey [name] D.[facility] 1:43 PM 05/16/21

## 2021-05-29 ENCOUNTER — Encounter: Payer: Medicare PPO | Admitting: Counselor

## 2021-05-30 ENCOUNTER — Encounter: Payer: Medicare PPO | Admitting: Counselor

## 2021-06-06 ENCOUNTER — Ambulatory Visit (INDEPENDENT_AMBULATORY_CARE_PROVIDER_SITE_OTHER): Payer: Medicare PPO

## 2021-06-06 ENCOUNTER — Other Ambulatory Visit: Payer: Self-pay

## 2021-06-06 ENCOUNTER — Encounter: Payer: Medicare PPO | Admitting: Counselor

## 2021-06-06 ENCOUNTER — Ambulatory Visit
Admission: RE | Admit: 2021-06-06 | Discharge: 2021-06-06 | Disposition: A | Discharge status: Home / Self Care | Payer: Medicare PPO | Source: Ambulatory Visit | Attending: Family Medicine | Admitting: Family Medicine

## 2021-06-06 DIAGNOSIS — M810 Age-related osteoporosis without current pathological fracture: Secondary | ICD-10-CM

## 2021-06-06 DIAGNOSIS — Z1231 Encounter for screening mammogram for malignant neoplasm of breast: Secondary | ICD-10-CM | POA: Diagnosis not present

## 2021-06-06 MED ORDER — DENOSUMAB 60 MG/ML ~~LOC~~ SOSY
60.0000 mg | PREFILLED_SYRINGE | Freq: Once | SUBCUTANEOUS | Status: AC
  Administered 2021-06-06: 60 mg via SUBCUTANEOUS

## 2021-06-06 NOTE — Progress Notes (Signed)
Per orders of Dr. [name], pt is here for Prolia injection, pt received injection in left arm, given SubQ by Leonor Liv, CMA. Pt tolerated injection well. Pt was notified to return in 6 months.

## 2021-06-07 ENCOUNTER — Encounter: Payer: Medicare PPO | Admitting: Counselor

## 2021-06-22 NOTE — Progress Notes (Signed)
[name] Phone: [phone] Subjective:   Kelsey [name], am serving as a scribe for Dr. [name]. This visit occurred during the SARS-CoV-2 public health emergency.  Safety protocols were in place, including screening questions prior to the visit, additional usage of staff PPE, and extensive cleaning of exam room while observing appropriate contact time as indicated for disinfecting solutions.   I'm seeing this patient by the request  of:  [name], [name], DO  CC: Neck and back pain follow-up  [facility]:[id number]  Kelsey [name] is a 75 y.o. female coming in with complaint of back and neck pain. OMT 05/08/2021. Patient states that she is having tightness in L cervical spine.   Medications patient has been prescribed: None          Reviewed prior external information including notes and imaging from previsou exam, outside providers and external EMR if available.   As well as notes that were available from care everywhere and other healthcare systems.  Past medical history, social, surgical and family history all reviewed in electronic medical record.  [name] pertanent information unless stated regarding to the chief complaint.   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    Uterine fibroid  11/2012   See Dr. Cletis Media note, surgery 11/2012; calcified- multiple   Vaginitis 09/13/2011    Allergies  Allergen Reactions   Penicillins Rash    Has patient had a PCN reaction causing immediate rash, facial/tongue/throat swelling, SOB or lightheadedness with hypotension: [name] Has patient had a PCN reaction causing severe rash involving mucus membranes or skin necrosis: yes Has patient had a PCN reaction that required hospitalization: [name] Has patient had a PCN reaction occurring within the last 10 years: [name] If all of the above answers are "[name]", then may proceed with Cephalosporin use.    Sulfa Antibiotics Rash    Under arm and thighs     Review of Systems:  [name] headache, visual changes, nausea, vomiting, diarrhea, constipation, dizziness, abdominal pain, skin rash, fevers, chills, night sweats, weight loss, swollen lymph nodes, body aches, joint swelling, chest pain, shortness of breath, mood changes. POSITIVE muscle aches  Objective  Blood pressure (!) 82/52, pulse 82, height 5\' 5"  (1.651 m), weight 108 lb (49 kg), SpO2 95 %.   General: [name] apparent distress alert and oriented x3 mood and affect normal, dressed appropriately.  Minorly cachectic seems a little more stressed than usual HEENT: Pupils equal, extraocular movements intact  Respiratory: Patient's speak in full sentences and does not appear short of breath  Cardiovascular: [name] lower extremity edema, non tender, [name] erythema  Neck exam does have some loss of lordosis.  Osteopathic findings  C2 flexed rotated  and side bent right C6 flexed rotated and side bent left T3 extended rotated and side bent right inhaled rib T9 extended rotated and side bent left L2 flexed rotated and side bent right L5 flexed rotated and side bent left Sacrum right on right       Assessment and Plan:  Degenerative lumbar spinal stenosis Significant loss of lordosis.  Patient continues to have tightness.  Patient is having difficulty but does  respond fairly well though to osteopathic manipulation.  Patient does have some increasing stress with the elements of her husband who is now on hospice.  Follow-up again in 6 to 8 weeks   Nonallopathic problems  Decision today to treat with OMT was based on Physical Exam  After verbal consent patient was treated with HVLA, ME, FPR techniques in cervical, rib, thoracic, lumbar, and sacral  areas  Patient tolerated the procedure well with improvement in symptoms  Patient given exercises, stretches and lifestyle modifications  See medications in patient instructions if given  Patient will follow up in 4-8 weeks     The above documentation has been reviewed and is accurate and complete Lyndal Pulley, DO        Note: This dictation was prepared with Dragon dictation along with smaller phrase technology. Any transcriptional errors that result from this process are unintentional.

## 2021-06-23 ENCOUNTER — Other Ambulatory Visit: Payer: Self-pay

## 2021-06-23 ENCOUNTER — Encounter: Payer: Self-pay | Admitting: Family Medicine

## 2021-06-23 ENCOUNTER — Ambulatory Visit (INDEPENDENT_AMBULATORY_CARE_PROVIDER_SITE_OTHER): Payer: Medicare PPO | Admitting: Family Medicine

## 2021-06-23 VITALS — BP 82/52 | HR 82 | Ht 65.0 in | Wt 108.0 lb

## 2021-06-23 DIAGNOSIS — M9902 Segmental and somatic dysfunction of thoracic region: Secondary | ICD-10-CM

## 2021-06-23 DIAGNOSIS — M9901 Segmental and somatic dysfunction of cervical region: Secondary | ICD-10-CM | POA: Diagnosis not present

## 2021-06-23 DIAGNOSIS — M9903 Segmental and somatic dysfunction of lumbar region: Secondary | ICD-10-CM

## 2021-06-23 DIAGNOSIS — M48061 Spinal stenosis, lumbar region without neurogenic claudication: Secondary | ICD-10-CM

## 2021-06-23 DIAGNOSIS — M9904 Segmental and somatic dysfunction of sacral region: Secondary | ICD-10-CM

## 2021-06-23 DIAGNOSIS — M9908 Segmental and somatic dysfunction of rib cage: Secondary | ICD-10-CM | POA: Diagnosis not present

## 2021-06-23 NOTE — Assessment & Plan Note (Signed)
Significant loss of lordosis.  Patient continues to have tightness.  Patient is having difficulty but does respond fairly well though to osteopathic manipulation.  Patient does have some increasing stress with the elements of her husband who is now on hospice.  Follow-up again in 6 to 8 weeks

## 2021-07-20 NOTE — Progress Notes (Signed)
[place] Phone: [phone] Subjective:   [name], am serving as a scribe for Dr. [name]. This visit occurred during the SARS-CoV-2 public health emergency.  Safety protocols were in place, including screening questions prior to the visit, additional usage of staff PPE, and extensive cleaning of exam room while observing appropriate contact time as indicated for disinfecting solutions.  I'm seeing this patient by the request  of:  [name], [name], DO  CC: Low back pain follow-up  [id number]  Kelsey [name] is a 76 y.o. female coming in with complaint of back and neck pain. OMT 06/23/2021. Patient states that she has been using lumbar belt for pain in lower back.   Medications patient has been prescribed: None  Taking:         Reviewed prior external information including notes and imaging from previsou exam, outside providers and external EMR if available.   As well as notes that were available from care everywhere and other healthcare systems.  Past medical history, social, surgical and family history all reviewed in electronic medical record.  No pertanent information unless stated regarding to the chief complaint.   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    Uterine  fibroid 11/2012   See Dr. Cletis Media note, surgery 11/2012; calcified- multiple   Vaginitis 09/13/2011    Allergies  Allergen Reactions   Penicillins Rash    Has patient had a PCN reaction causing immediate rash, facial/tongue/throat swelling, SOB or lightheadedness with hypotension: no Has patient had a PCN reaction causing severe rash involving mucus membranes or skin necrosis: yes Has patient had a PCN reaction that required hospitalization: no Has patient had a PCN reaction occurring within the last 10 years: no If all of the above answers are "NO", then may proceed with Cephalosporin use.    Sulfa Antibiotics Rash    Under arm and thighs     Review of Systems:  No headache, visual changes, nausea, vomiting, diarrhea, constipation, dizziness, abdominal pain, skin rash, fevers, chills, night sweats, weight loss, swollen lymph nodes, body aches, joint swelling, chest pain, shortness of breath, mood changes. POSITIVE muscle aches, fatigue  Objective  Blood pressure (!) 86/66, pulse 78, height 5\' 5"  (1.651 m), weight 108 lb (49 kg), SpO2 95 %.   General: No apparent distress alert and oriented x3 mood and affect normal, dressed appropriately.  Patient is very tearful talking about her husband. HEENT: Pupils equal, extraocular movements intact  Respiratory: Patient's speak in full sentences and does not appear short of breath  Cardiovascular: No lower extremity edema, non tender, no erythema  Gait normal with good balance and coordination.  MSK:  Non tender with  full range of motion and good stability and symmetric strength and tone of shoulders, elbows, wrist, hip, knee and ankles bilaterally.  Back -low back exam does have some loss of lordosis.  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  Osteopathic findings   T9 extended rotated and side bent left L2 flexed rotated and side bent right L5 flexed rotated and side bent left Sacrum right on right       Assessment and  Plan:  Degenerative lumbar spinal stenosis Significant degenerative spinal stenosis.  Increasing tightness noted.  We discussed with patient at great length.  Recently did lose her husband and I do feel that this is exacerbated some of it.  Encourage patient to continue to look out for herself to a certain degree.  Patient needs to start doing some exercises again.  We may need to consider formal physical therapy with patient feels up to it.  Discussed icing regimen and home exercises.  Follow-up with me again in 6 to 8 weeks.   Nonallopathic problems  Decision today to treat with OMT was based on Physical Exam  After verbal consent patient was treated with HVLA, ME, FPR techniques in  thoracic, lumbar, and sacral  areas  Patient tolerated the procedure well with improvement in symptoms  Patient given exercises, stretches and lifestyle modifications  See medications in patient instructions if given  Patient will follow up in 4-8 weeks Discussing patient other wellbeing and making sure that she has resources with the loss of her husband greater than 31 minutes today.     The above documentation has been reviewed and is accurate and complete Kelsey Pulley, DO        Note: This dictation was prepared with Dragon dictation along with smaller phrase technology. Any transcriptional errors that result from this process are unintentional.

## 2021-07-21 ENCOUNTER — Encounter: Payer: Self-pay | Admitting: Family Medicine

## 2021-07-21 ENCOUNTER — Other Ambulatory Visit: Payer: Self-pay

## 2021-07-21 ENCOUNTER — Ambulatory Visit (INDEPENDENT_AMBULATORY_CARE_PROVIDER_SITE_OTHER): Payer: Medicare PPO | Admitting: Family Medicine

## 2021-07-21 VITALS — BP 86/66 | HR 78 | Ht 65.0 in | Wt 108.0 lb

## 2021-07-21 DIAGNOSIS — M48061 Spinal stenosis, lumbar region without neurogenic claudication: Secondary | ICD-10-CM

## 2021-07-21 DIAGNOSIS — M9904 Segmental and somatic dysfunction of sacral region: Secondary | ICD-10-CM

## 2021-07-21 DIAGNOSIS — M9903 Segmental and somatic dysfunction of lumbar region: Secondary | ICD-10-CM

## 2021-07-21 DIAGNOSIS — M9902 Segmental and somatic dysfunction of thoracic region: Secondary | ICD-10-CM | POA: Diagnosis not present

## 2021-07-21 NOTE — Patient Instructions (Signed)
Good to see you Sorry for your loss We are here if you need anything See me again in 4 weeks

## 2021-07-21 NOTE — Assessment & Plan Note (Signed)
Significant degenerative spinal stenosis.  Increasing tightness noted.  We discussed with patient at great length.  Recently did lose her husband and I do feel that this is exacerbated some of it.  Encourage patient to continue to look out for herself to a certain degree.  Patient needs to start doing some exercises again.  We may need to consider formal physical therapy with patient feels up to it.  Discussed icing regimen and home exercises.  Follow-up with me again in 6 to 8 weeks.

## 2021-08-17 NOTE — Progress Notes (Signed)
[place] [facility] Phone: [phone] Subjective:   [name], am serving as a scribe for Dr. [name].  This visit occurred during the SARS-CoV-2 public health emergency.  Safety protocols were in place, including screening questions prior to the visit, additional usage of staff PPE, and extensive cleaning of exam room while observing appropriate contact time as indicated for disinfecting solutions.   I'm seeing this patient by the request  of:  [name], [name], DO  CC: Neck and back pain follow-up  [id number]  Silva MARKIE HEFFERNAN is a 76 y.o. female coming in with complaint of back and neck pain. OMT 07/21/2021. Patient states that she continues to have lumbar spine and R glute pain. Does not have radiating pain. Has been stretching.   Medications patient has been prescribed: None  Taking:         Reviewed prior external information including notes and imaging from previsou exam, outside providers and external EMR if available.   As well as notes that were available from care everywhere and other healthcare systems.  Past medical history, social, surgical and family history all reviewed in electronic medical record.  No pertanent information unless stated regarding to the chief complaint.   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    Uterine fibroid 11/2012   See Dr. Cletis Media note, surgery 11/2012; calcified- multiple   Vaginitis 09/13/2011    Allergies  Allergen Reactions   Penicillins Rash    Has patient had a PCN reaction causing immediate rash, facial/tongue/throat swelling, SOB or lightheadedness with hypotension: no Has patient had a PCN reaction causing severe rash involving mucus membranes or skin necrosis: yes Has patient had a PCN reaction that required hospitalization: no Has patient had a PCN reaction occurring within the last 10 years: no If all of the above answers are "NO", then may proceed with Cephalosporin use.    Sulfa Antibiotics Rash    Under arm and thighs     Review of Systems:  No headache, visual changes, nausea, vomiting, diarrhea, constipation, dizziness, abdominal pain, skin rash, fevers, chills, night sweats, weight loss, swollen lymph nodes, body aches, joint swelling, chest pain, shortness of breath, mood changes. POSITIVE muscle aches  Objective  Blood pressure (!) 96/52, pulse 83, height 5\' 5"  (1.651 m), weight 110 lb (49.9 kg), SpO2 96 %.   General: No apparent distress alert and oriented x3 mood and affect normal, dressed appropriately.  HEENT: Pupils equal, extraocular movements intact  Respiratory: Patient's speak in full sentences and does not appear short of breath  Cardiovascular: No lower extremity edema, non tender, no erythema  Low back exam has loss lordosis. Tightness still noted in the thoracolumbar  juncture and significant tenderness over the right sacroiliac joint.  Patient has some tightness in the gluteal area as well.  Osteopathic findings  C2 flexed rotated and side bent right C6 flexed rotated and side bent left T3 extended rotated and side bent right inhaled rib T9 extended rotated and side bent left L2 flexed rotated and side bent right L4 flexed rotated and side bent left Sacrum right on right        Assessment and Plan:  Degenerative lumbar spinal stenosis Chronic problem with exacerbation.  Patient is having worsening pain.  He wants to avoid any type of medications when possible.  Patient now is no longer being the primary caregiver for her husband and I think patient does tend to focus more on herself.  Would like her to try another epidural in the back to see if this will alleviate some of the discomfort and pain that she is having so we are able to do more of the activities and exercises to strengthen the core.  Patient is in agreement with this and will follow-up with me again in 6 to 8 weeks   Nonallopathic problems  Decision today to treat with OMT was based on Physical Exam  After verbal consent patient was treated with HVLA, ME, FPR techniques in cervical, rib, thoracic, lumbar, and sacral  areas  Patient tolerated the procedure well with improvement in symptoms  Patient given exercises, stretches and lifestyle modifications  See medications in patient instructions if given  Patient will follow up in 4-8 weeks     The above documentation has been reviewed and is accurate and complete Lyndal Pulley, DO        Note: This dictation was prepared with Dragon dictation along with smaller phrase technology. Any transcriptional errors that result from this process are unintentional.

## 2021-08-18 ENCOUNTER — Encounter: Payer: Self-pay | Admitting: Family Medicine

## 2021-08-18 ENCOUNTER — Ambulatory Visit (INDEPENDENT_AMBULATORY_CARE_PROVIDER_SITE_OTHER): Payer: Medicare PPO | Admitting: Family Medicine

## 2021-08-18 ENCOUNTER — Other Ambulatory Visit: Payer: Self-pay

## 2021-08-18 VITALS — BP 96/52 | HR 83 | Ht 65.0 in | Wt 110.0 lb

## 2021-08-18 DIAGNOSIS — M9901 Segmental and somatic dysfunction of cervical region: Secondary | ICD-10-CM | POA: Diagnosis not present

## 2021-08-18 DIAGNOSIS — M9904 Segmental and somatic dysfunction of sacral region: Secondary | ICD-10-CM

## 2021-08-18 DIAGNOSIS — M5416 Radiculopathy, lumbar region: Secondary | ICD-10-CM

## 2021-08-18 DIAGNOSIS — M9903 Segmental and somatic dysfunction of lumbar region: Secondary | ICD-10-CM | POA: Diagnosis not present

## 2021-08-18 DIAGNOSIS — M9902 Segmental and somatic dysfunction of thoracic region: Secondary | ICD-10-CM | POA: Diagnosis not present

## 2021-08-18 DIAGNOSIS — M9908 Segmental and somatic dysfunction of rib cage: Secondary | ICD-10-CM

## 2021-08-18 DIAGNOSIS — M48061 Spinal stenosis, lumbar region without neurogenic claudication: Secondary | ICD-10-CM | POA: Diagnosis not present

## 2021-08-18 NOTE — Assessment & Plan Note (Signed)
Chronic problem with exacerbation.  Patient is having worsening pain.  He wants to avoid any type of medications when possible.  Patient now is no longer being the primary caregiver for her husband and I think patient does tend to focus more on herself.  Would like her to try another epidural in the back to see if this will alleviate some of the discomfort and pain that she is having so we are able to do more of the activities and exercises to strengthen the core.  Patient is in agreement with this and will follow-up with me again in 6 to 8 weeks

## 2021-08-18 NOTE — Patient Instructions (Addendum)
Stay hydrated Epidural L4/L5- [facility] [phone] See me in 4-6 weeks

## 2021-08-21 ENCOUNTER — Ambulatory Visit: Payer: Medicare PPO | Admitting: Neurology

## 2021-08-23 ENCOUNTER — Telehealth: Payer: Self-pay | Admitting: Family Medicine

## 2021-08-23 NOTE — Telephone Encounter (Signed)
Left message for patient to schedule Annual Wellness Visit.  Please schedule (telephone/video call) with Nurse Health Advisor Tina Betterson, RN at [facility] [facility]. Please call [phone] ask for Kathy 

## 2021-09-06 ENCOUNTER — Ambulatory Visit (INDEPENDENT_AMBULATORY_CARE_PROVIDER_SITE_OTHER): Payer: Medicare PPO

## 2021-09-06 ENCOUNTER — Other Ambulatory Visit: Payer: Self-pay

## 2021-09-06 VITALS — BP 100/62 | HR 77 | Temp 98.6°F | Wt 107.8 lb

## 2021-09-06 DIAGNOSIS — Z0000 Encounter for general adult medical examination without abnormal findings: Secondary | ICD-10-CM

## 2021-09-06 NOTE — Progress Notes (Signed)
Subjective:   Kelsey [name] is a 76 y.o. female who presents for Medicare Annual (Subsequent) preventive examination.  Review of Systems     Cardiac Risk Factors include: advanced age (>26men, >42 women)     Objective:    Today's Vitals   09/06/21 1153  BP: 100/62  Pulse: 77  Temp: 98.6 F (37 C)  SpO2: 96%  Weight: 107 lb 12.8 oz (48.9 kg)   Body mass index is 17.94 kg/m.  Advanced Directives 09/06/2021 03/30/2021 01/23/2021 08/17/2020 06/07/2020 04/07/2018 12/18/2016  Does Patient Have a Medical Advance Directive? Yes No Yes Yes Yes Yes Yes  Type of [employer] [place];Living will;Out of facility DNR (pink MOST or yellow form) [place];Living will Living will [place];Living will [place];Living will  Copy of [place] in Chart? Yes - validated most recent copy scanned in chart (See row information) - - Yes - validated most recent copy scanned in chart (See row information) - Yes Yes  Would patient like information on creating a medical advance directive? - No - Patient declined - - - - -    Current Medications (verified) Outpatient Encounter Medications as of 09/06/2021  Medication Sig   atorvastatin (LIPITOR) 10 MG tablet Take 1 tablet (10 mg total) by mouth every evening.   Cyanocobalamin (VITAMIN B-12) 500 MCG SUBL 1 tab placed under the tongue daily (Patient taking differently: 625 mcg. 1 tab placed under the tongue daily; 5000 mcg cut into 8th)   omeprazole (PRILOSEC) 40 MG capsule Take 1 capsule (40 mg total) by mouth daily.   Vitamin D, Ergocalciferol, (DRISDOL) 1.25 MG (50000 UNIT) CAPS capsule Take 1 capsule (50,000 Units total) by mouth every 7 (seven) days.   Probiotic Product (ALIGN EXTRA STRENGTH) CAPS Take 1 capsule by mouth 4 (four) times daily. (Patient not taking: Reported on 09/06/2021)   [DISCONTINUED] sucralfate (CARAFATE) 1  g tablet TAKE 1 TABLET(1 GRAM) BY MOUTH FOUR TIMES DAILY AT BEDTIME WITH MEALS   No facility-administered encounter medications on file as of 09/06/2021.    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    Uterine fibroid 11/2012   See Dr. Cletis Media note, surgery 11/2012; calcified- multiple   Vaginitis 09/13/2011   Past Surgical History:  Procedure Laterality Date   DILATATION & CURRETTAGE/HYSTEROSCOPY WITH RESECTOCOPE N/A 11/06/2012   Procedure: [place];  Surgeon: Alwyn Pea, MD;  Location: [facility];  Service: Gynecology;  Laterality: N/A;   HERNIA REPAIR  1992   I & D EXTREMITY Left 03/08/2021   Procedure: IRRIGATION AND DEBRIDEMENT OF THUMB AND ARM;  Surgeon: Leanora Cover, MD;  Location: [facility];  Service: Orthopedics;  Laterality: Left;   small intestine hernia  1994   WISDOM TOOTH EXTRACTION     Family History  Problem Relation Age of Onset   Heart disease Mother    Osteoporosis Mother    Cancer Father        prostate   Osteoporosis Sister    Gout Brother    Gout Maternal Grandmother    Heart disease Maternal Grandmother    Alzheimer's disease Maternal Grandfather    Osteoporosis Sister    Social History   Socioeconomic History   Marital status: Married     Spouse name: Not on file   Number of children: Not on file   Years of education: Not on file   Highest education level: Not on file  Occupational History   Not on file  Tobacco Use   Smoking status: Former    Packs/day: 0.30    Years: 10.00    Pack years: 3.00    Types: Cigarettes    Quit date: 07/09/1978    Years since quitting: 43.1   Smokeless tobacco: Never  Vaping Use   Vaping Use: Never used  Substance and Sexual Activity   Alcohol use: Yes    Comment: 1-2 glasses of wine daily and a 12 oz beer daily   Drug use: No   Sexual activity: Never  Other Topics Concern   Not on file  Social History Narrative   Right handed    Lives with husband    Social Determinants of Health   Financial Resource Strain: Low Risk    Difficulty of Paying Living Expenses: Not hard at all  Food Insecurity: No Food Insecurity   Worried About Charity fundraiser in the Last Year: Never true   [place] in the Last Year: Never true  Transportation Needs: No Transportation Needs   Lack of Transportation (Medical): No   Lack of Transportation (Non-Medical): No  Physical Activity: Sufficiently Active   Days of Exercise per Week: 5 days   Minutes of Exercise per Session: 60 min  Stress: No Stress Concern Present   Feeling of Stress : Not at all  Social Connections: Socially Isolated   Frequency of Communication with Friends and Family: More than three times a week   Frequency of Social Gatherings with Friends and Family: More than three times a week   Attends Religious Services: Never   Marine scientist or Organizations: No   Attends Archivist Meetings: Never   Marital Status: Widowed    Tobacco Counseling Counseling given: Not Answered   Clinical Intake:  Pre-visit preparation completed: Yes  Pain : No/denies pain     BMI - recorded: 17.94 Nutritional Status: BMI <19  Underweight Nutritional Risks: None Diabetes: No  How often do you need to have  someone help you when you read instructions, pamphlets, or other written materials from your doctor or pharmacy?: 1 - Never  Diabetic?No  Interpreter Needed?: No  Information entered by :: Charlott Rakes, LPN   Activities of Daily Living In your present state of health, do you have any difficulty performing the following activities: 09/06/2021  Hearing? N  Vision? N  Difficulty concentrating or making decisions? N  Walking or climbing stairs? N  Dressing or bathing? N  Doing errands, shopping? N  Preparing Food and eating ? N  Using the Toilet? N  In the past six months, have you  accidently leaked urine? N  Do you have problems with loss of bowel control? N  Managing your Medications? N  Managing your Finances? N  Housekeeping or managing your Housekeeping? N  Some recent data might be hidden    Patient Care Team: [name], DO as PCP - General (Family Medicine) [name], MD as Consulting Physician (Gastroenterology) Calvert Cantor, MD as Consulting Physician (Ophthalmology) Lyndal Pulley, DO as Consulting Physician (Family Medicine) Cameron Sprang, MD as Consulting Physician (Neurology)  Indicate any recent Medical Services you may have received from other than [facility] providers in the past year (date may be approximate).     Assessment:   This is a routine wellness examination for Eileen.  Hearing/Vision screen Hearing Screening - Comments:: Pt denies any hearing issues  Vision Screening - Comments:: Pt follows up with [facility] for annual eye exams   Dietary issues and exercise activities discussed: Current Exercise Habits: Home exercise routine;Structured exercise class, Type of exercise: Other - see comments;strength training/weights, Time (Minutes): > 60, Frequency (Times/Week): 6, Weekly Exercise (Minutes/Week): 0   Goals Addressed             This Visit's Progress    Patient Stated       Gain 10 lbs        Depression Screen PHQ 2/9 Scores  09/06/2021 05/08/2021 08/19/2020 08/17/2020 03/02/2020 12/26/2018 04/07/2018  PHQ - 2 Score 0 0 1 0 1 0 0  PHQ- 9 Score - - 7 - - - -    Fall Risk Fall Risk  09/06/2021 05/08/2021 01/23/2021 08/17/2020 03/02/2020  Falls in the past year? 0 0 0 0 1  Comment - - - - -  Number falls in past yr: 0 0 0 0 0  Injury with Fall? 0 0 0 0 1  Comment - - - - -  Risk for fall due to : Impaired vision - - - -  Risk for fall due to: Comment - - - - -  Follow up Falls prevention discussed Falls evaluation completed - Falls prevention discussed Falls evaluation completed    FALL RISK PREVENTION PERTAINING TO THE HOME:  Any stairs in or around the home? Yes  If so, are there any without handrails? No  Home free of loose throw rugs in walkways, pet beds, electrical cords, etc? Yes  Adequate lighting in your home to reduce risk of falls? Yes   ASSISTIVE DEVICES UTILIZED TO PREVENT FALLS:  Life alert? No  Use of a cane, walker or w/c? No  Grab bars in the bathroom? Yes  Shower chair or bench in shower? Yes  Elevated toilet seat or a handicapped toilet? No   TIMED UP AND GO:  Was the test performed? Yes .  Length of time to ambulate 10 feet: 10 sec.   Gait steady and fast without use of assistive device  Cognitive Function: MMSE - Mini Mental State Exam 04/07/2018  Orientation to time 5  Orientation to Place 5  Registration 3  Attention/ Calculation 5  Recall 3  Language- name 2 objects 2  Language- repeat 1  Language- follow 3 step command 3  Language- read & follow direction 1  Write a sentence 1  Copy design 1  Total score 30   Montreal Cognitive Assessment  01/23/2021  Visuospatial/ Executive (0/5) 5  Naming (0/3) 3  Attention: Read list of digits (0/2) 2  Attention: Read list of letters (0/1) 1  Attention: Serial 7 subtraction starting  at 100 (0/3) 3  Language: Repeat phrase (0/2) 0  Language : Fluency (0/1) 1  Abstraction (0/2) 2  Delayed Recall (0/5) 5  Orientation (0/6) 6  Total 28   Adjusted Score (based on education) 28   6CIT Screen 09/06/2021 08/17/2020  What Year? 0 points 0 points  What month? 0 points 0 points  What time? 0 points 0 points  Count back from 20 0 points 0 points  Months in reverse 0 points 0 points  Repeat phrase 2 points 0 points  Total Score 2 0    Immunizations Immunization History  Administered Date(s) Administered   Fluad Quad(high Dose 65+) 04/14/2019, 08/19/2020, 05/08/2021   Hepatitis A, Adult 03/23/2014   Influenza Whole 04/09/2011   Influenza, High Dose Seasonal PF 05/27/2015, 04/12/2016, 04/02/2017, 04/07/2018   Influenza,inj,Quad PF,6+ Mos 03/19/2014   PFIZER(Purple Top)SARS-COV-2 Vaccination 07/20/2019, 08/19/2019   Pneumococcal Conjugate-13 12/02/2013, 04/02/2014   Pneumococcal Polysaccharide-23 12/08/2015   Tdap 12/02/2013, 03/08/2021    TDAP status: Up to date  Flu Vaccine status: Up to date  Pneumococcal vaccine status: Up to date  Covid-19 vaccine status: Completed vaccines  Qualifies for Shingles Vaccine? Yes   Zostavax completed No   Shingrix Completed?: No.    Education has been provided regarding the importance of this vaccine. Patient has been advised to call insurance company to determine out of pocket expense if they have not yet received this vaccine. Advised may also receive vaccine at local pharmacy or Health Dept. Verbalized acceptance and understanding.  Screening Tests Health Maintenance  Topic Date Due   Zoster Vaccines- Shingrix (1 of 2) Never done   COVID-19 Vaccine (3 - Pfizer risk series) 09/16/2019   MAMMOGRAM  06/06/2022   DEXA SCAN  02/04/2023   COLONOSCOPY (Pts 45-69yrs Insurance coverage will need to be confirmed)  01/07/2024   TETANUS/TDAP  03/09/2031   Pneumonia Vaccine 45+ Years old  Completed   INFLUENZA VACCINE  Completed   Hepatitis C Screening  Completed   HPV VACCINES  Aged Out    Health Maintenance  Health Maintenance Due  Topic Date Due   Zoster Vaccines- Shingrix (1 of  2) Never done   COVID-19 Vaccine (3 - Pfizer risk series) 09/16/2019    Colorectal cancer screening: Type of screening: Colonoscopy. Completed 01/06/14. Repeat every 10 years  Mammogram status: Completed 06/06/21. Repeat every year  Bone Density status: Completed 02/03/21. Results reflect: Bone density results: OSTEOPOROSIS. Repeat every 2 years.   Additional Screening:  Hepatitis C Screening:  Completed 12/02/13  Vision Screening: Recommended annual ophthalmology exams for early detection of glaucoma and other disorders of the eye. Is the patient up to date with their annual eye exam?  Yes  Who is the provider or what is the name of the office in which the patient attends annual eye exams? [facility]  If pt is not established with a provider, would they like to be referred to a provider to establish care? No .   Dental Screening: Recommended annual dental exams for proper oral hygiene  Community Resource Referral / Chronic Care Management: CRR required this visit?  No   CCM required this visit?  No      Plan:     I have personally reviewed and noted the following in the patients chart:   Medical and social history Use of alcohol, tobacco or illicit drugs  Current medications and supplements including opioid prescriptions.  Functional ability and status Nutritional status Physical activity Advanced directives List of  other physicians Hospitalizations, surgeries, and ER visits in previous 12 months Vitals Screenings to include cognitive, depression, and falls Referrals and appointments  In addition, I have reviewed and discussed with patient certain preventive protocols, quality metrics, and best practice recommendations. A written personalized care plan for preventive services as well as general preventive health recommendations were provided to patient.     Willette Brace, LPN   12/11/8004   Nurse Notes: None

## 2021-09-06 NOTE — Patient Instructions (Signed)
Ms. Ringgenberg , Thank you for taking time to come for your Medicare Wellness Visit. I appreciate your ongoing commitment to your health goals. Please review the following plan we discussed and let me know if I can assist you in the future.   Screening recommendations/referrals: Colonoscopy: Done 01/06/14 repeat every 10 years  Mammogram: Done 06/06/21 repeat every year  Bone Density: Done 02/03/21 repeat every 2 years Recommended yearly ophthalmology/optometry visit for glaucoma screening and checkup Recommended yearly dental visit for hygiene and checkup  Vaccinations: Influenza vaccine: Done 05/08/21 repeat every year  Pneumococcal vaccine: Up to date Tdap vaccine: done 03/08/21 repeat every 10 years  Shingles vaccine: Shingrix discussed. Please contact your pharmacy for coverage information.    Covid-19:Completed 1/11, 08/19/19  Advanced directives: Copies in chart  Conditions/risks identified: Gain weight   Next appointment: Follow up in one year for your annual wellness visit    Preventive Care 65 Years and Older, Female Preventive care refers to lifestyle choices and visits with your health care provider that can promote health and wellness. What does preventive care include? A yearly physical exam. This is also called an annual well check. Dental exams once or twice a year. Routine eye exams. Ask your health care provider how often you should have your eyes checked. Personal lifestyle choices, including: Daily care of your teeth and gums. Regular physical activity. Eating a healthy diet. Avoiding tobacco and drug use. Limiting alcohol use. Practicing safe sex. Taking low-dose aspirin every day. Taking vitamin and mineral supplements as recommended by your health care provider. What happens during an annual well check? The services and screenings done by your health care provider during your annual well check will depend on your age, overall health, lifestyle risk factors,  and family history of disease. Counseling  Your health care provider may ask you questions about your: Alcohol use. Tobacco use. Drug use. Emotional well-being. Home and relationship well-being. Sexual activity. Eating habits. History of falls. Memory and ability to understand (cognition). Work and work Statistician. Reproductive health. Screening  You may have the following tests or measurements: Height, weight, and BMI. Blood pressure. Lipid and cholesterol levels. These may be checked every 5 years, or more frequently if you are over 14 years old. Skin check. Lung cancer screening. You may have this screening every year starting at age 54 if you have a 30-pack-year history of smoking and currently smoke or have quit within the past 15 years. Fecal occult blood test (FOBT) of the stool. You may have this test every year starting at age 80. Flexible sigmoidoscopy or colonoscopy. You may have a sigmoidoscopy every 5 years or a colonoscopy every 10 years starting at age 15. Hepatitis C blood test. Hepatitis B blood test. Sexually transmitted disease (STD) testing. Diabetes screening. This is done by checking your blood sugar (glucose) after you have not eaten for a while (fasting). You may have this done every 1-3 years. Bone density scan. This is done to screen for osteoporosis. You may have this done starting at age 6. Mammogram. This may be done every 1-2 years. Talk to your health care provider about how often you should have regular mammograms. Talk with your health care provider about your test results, treatment options, and if necessary, the need for more tests. Vaccines  Your health care provider may recommend certain vaccines, such as: Influenza vaccine. This is recommended every year. Tetanus, diphtheria, and acellular pertussis (Tdap, Td) vaccine. You may need a Td booster every 10 years.  Zoster vaccine. You may need this after age 64. Pneumococcal 13-valent conjugate  (PCV13) vaccine. One dose is recommended after age 49. Pneumococcal polysaccharide (PPSV23) vaccine. One dose is recommended after age 25. Talk to your health care provider about which screenings and vaccines you need and how often you need them. This information is not intended to replace advice given to you by your health care provider. Make sure you discuss any questions you have with your health care provider. Document Released: 07/22/2015 Document Revised: 03/14/2016 Document Reviewed: 04/26/2015 Elsevier Interactive Patient Education  2017 [place] Prevention in the Home Falls can cause injuries. They can happen to people of all ages. There are many things you can do to make your home safe and to help prevent falls. What can I do on the outside of my home? Regularly fix the edges of walkways and driveways and fix any cracks. Remove anything that might make you trip as you walk through a door, such as a raised step or threshold. Trim any bushes or trees on the path to your home. Use bright outdoor lighting. Clear any walking paths of anything that might make someone trip, such as rocks or tools. Regularly check to see if handrails are loose or broken. Make sure that both sides of any steps have handrails. Any raised decks and porches should have guardrails on the edges. Have any leaves, snow, or ice cleared regularly. Use sand or salt on walking paths during winter. Clean up any spills in your garage right away. This includes oil or grease spills. What can I do in the bathroom? Use night lights. Install grab bars by the toilet and in the tub and shower. Do not use towel bars as grab bars. Use non-skid mats or decals in the tub or shower. If you need to sit down in the shower, use a plastic, non-slip stool. Keep the floor dry. Clean up any water that spills on the floor as soon as it happens. Remove soap buildup in the tub or shower regularly. Attach bath mats securely with  double-sided non-slip rug tape. Do not have throw rugs and other things on the floor that can make you trip. What can I do in the bedroom? Use night lights. Make sure that you have a light by your bed that is easy to reach. Do not use any sheets or blankets that are too big for your bed. They should not hang down onto the floor. Have a firm chair that has side arms. You can use this for support while you get dressed. Do not have throw rugs and other things on the floor that can make you trip. What can I do in the kitchen? Clean up any spills right away. Avoid walking on wet floors. Keep items that you use a lot in easy-to-reach places. If you need to reach something above you, use a strong step stool that has a grab bar. Keep electrical cords out of the way. Do not use floor polish or wax that makes floors slippery. If you must use wax, use non-skid floor wax. Do not have throw rugs and other things on the floor that can make you trip. What can I do with my stairs? Do not leave any items on the stairs. Make sure that there are handrails on both sides of the stairs and use them. Fix handrails that are broken or loose. Make sure that handrails are as long as the stairways. Check any carpeting to make sure that  it is firmly attached to the stairs. Fix any carpet that is loose or worn. Avoid having throw rugs at the top or bottom of the stairs. If you do have throw rugs, attach them to the floor with carpet tape. Make sure that you have a light switch at the top of the stairs and the bottom of the stairs. If you do not have them, ask someone to add them for you. What else can I do to help prevent falls? Wear shoes that: Do not have high heels. Have rubber bottoms. Are comfortable and fit you well. Are closed at the toe. Do not wear sandals. If you use a stepladder: Make sure that it is fully opened. Do not climb a closed stepladder. Make sure that both sides of the stepladder are locked  into place. Ask someone to hold it for you, if possible. Clearly mark and make sure that you can see: Any grab bars or handrails. First and last steps. Where the edge of each step is. Use tools that help you move around (mobility aids) if they are needed. These include: Canes. Walkers. Scooters. Crutches. Turn on the lights when you go into a dark area. Replace any light bulbs as soon as they burn out. Set up your furniture so you have a clear path. Avoid moving your furniture around. If any of your floors are uneven, fix them. If there are any pets around you, be aware of where they are. Review your medicines with your doctor. Some medicines can make you feel dizzy. This can increase your chance of falling. Ask your doctor what other things that you can do to help prevent falls. This information is not intended to replace advice given to you by your health care provider. Make sure you discuss any questions you have with your health care provider. Document Released: 04/21/2009 Document Revised: 12/01/2015 Document Reviewed: 07/30/2014 Elsevier Interactive Patient Education  2017 [employer].

## 2021-09-13 ENCOUNTER — Ambulatory Visit (INDEPENDENT_AMBULATORY_CARE_PROVIDER_SITE_OTHER): Payer: Medicare PPO | Admitting: Family Medicine

## 2021-09-13 ENCOUNTER — Telehealth: Payer: Self-pay | Admitting: Family Medicine

## 2021-09-13 ENCOUNTER — Other Ambulatory Visit: Payer: Self-pay

## 2021-09-13 ENCOUNTER — Encounter: Payer: Self-pay | Admitting: Family Medicine

## 2021-09-13 VITALS — BP 103/60 | HR 88 | Temp 97.4°F | Ht 65.0 in | Wt 108.0 lb

## 2021-09-13 DIAGNOSIS — L409 Psoriasis, unspecified: Secondary | ICD-10-CM | POA: Diagnosis not present

## 2021-09-13 DIAGNOSIS — M7989 Other specified soft tissue disorders: Secondary | ICD-10-CM | POA: Diagnosis not present

## 2021-09-13 DIAGNOSIS — R21 Rash and other nonspecific skin eruption: Secondary | ICD-10-CM | POA: Diagnosis not present

## 2021-09-13 MED ORDER — PREDNISONE 20 MG PO TABS: 20.0000 mg | ORAL_TABLET | Freq: Every day | ORAL | 0 refills | Status: DC

## 2021-09-13 MED ORDER — CLOBETASOL PROPIONATE 0.05 % EX SOLN: 1.0000 "application " | Freq: Two times a day (BID) | CUTANEOUS | 0 refills | Status: DC

## 2021-09-13 NOTE — Patient Instructions (Signed)
Psoriasis Psoriasis is a long-term (chronic) skin condition. It occurs because your body's defense system (immune system) causes skin cells to form too quickly. This causes raised, red patches (plaques) on your skin that look silvery. The patches may be on all areas of your body.They can be any size or shape. Psoriasis can come and go. It can range from mild to very bad. It cannot be passed from one person to another (is not contagious). There is no cure for this condition, but it can be helped with treatment. What are the causes? The cause of psoriasis is not known. Some things can make it worse. These are: Skin damage, such as cuts, scrapes, sunburn, and dryness. Not getting enough sunlight. Some medicines. Alcohol. Tobacco. Stress. Infections. What increases the risk? Having a family member with psoriasis. Being very overweight (obese). Being 20-40 years old. Taking certain medicines. What are the signs or symptoms? There are different types of psoriasis. The types are: Plaque. This is the most common. Symptoms include red, raised patches with a silvery coating. These may be itchy. Your nails may be crumbly or fall off. Guttate. Symptoms include small red spots on your stomach area, arms, and legs. These may happen after you have been sick, such as with strep throat. Inverse. Symptoms include patches in your armpits, under your breasts, private areas, or on your butt. Pustular. Symptoms include pus-filled bumps on the palms of your hands or the soles of your feet. You also may feel very tired, weak, have a fever, and not be hungry. Erythrodermic. Symptoms include bright red skin that looks burned. You may have a fast heartbeat and a body temperature that is too high or too low. You may be itchy or in pain. Sebopsoriasis. Symptoms include red patches on your scalp, forehead, and face that are greasy. Psoriatic arthritis. Symptoms include swollen, painful joints along with scaly skin  patches. How is this treated? There is no cure for this condition, but treatment can: Help your skin heal. Lessen itching and irritation and swelling (inflammation). Slow the growth of new skin cells. Help your body's defense system respond better to your skin. Treatment may include: Creams or ointments. Light therapy. This may include natural sunlight or light therapy in a doctor's office. Medicines. These can help your body better manage skin cells. They may be used with light therapy or ointments. Medicines may include pills or injections. You may also get antibiotic medicines if you have an infection. Follow these instructions at home: Skin Care Apply lotion to your skin as needed. Only use those that your doctor has said are okay. Apply cool, wet cloths (cold compresses) to the affected areas. Do not use a hot tub or take hot showers. Use slightly warm, not hot, water when taking showers and baths. Do not scratch your skin. Lifestyle  Do not use any products that contain nicotine or tobacco, such as cigarettes, e-cigarettes, and chewing tobacco. If you need help quitting, ask your doctor. Lower your stress. Keep a healthy weight. Go out in the sun as told by your doctor. Do not get sunburned. Join a support group.  Medicines Take or use over-the-counter and prescription medicines only as told by your doctor. If you were prescribed an antibiotic medicine, take it as told by your doctor. Do not stop using the antibiotic even if you start to feel better. Alcohol use If you drink alcohol: Limit how much you use: 0-1 drink a day for women. 0-2 drinks a day for men.   Be aware of how much alcohol is in your drink. In the [place] one drink equals one 12 oz bottle of beer (355 mL), one 5 oz glass of wine (148 mL), or one 1 oz glass of hard liquor (44 mL). General instructions Keep a journal to track the things that cause symptoms (triggers). Try to avoid these things. See a counselor if  you feel the support would help. Keep all follow-up visits as told by your doctor. This is important. Contact a doctor if: You have a fever. Your pain gets worse. You have more redness or warmth in the affected areas. You have new or worse pain or stiffness in your joints. Your nails start to break easily or pull away from the nail bed. You feel very sad (depressed). Summary Psoriasis is a long-term (chronic) skin condition. There is no cure for this condition, but treatment can help manage it. Keep a journal to track the things that cause symptoms. Take or use over-the-counter and prescription medicines only as told by your doctor. Keep all follow-up visits as told by your doctor. This is important. This information is not intended to replace advice given to you by your health care provider. Make sure you discuss any questions you have with your healthcare provider. Document Revised: 04/29/2018 Document Reviewed: 04/29/2018 Elsevier Patient Education  2022 [employer]  

## 2021-09-13 NOTE — Progress Notes (Signed)
° ° ° °This visit occurred during the SARS-CoV-2 public health emergency.  Safety protocols were in place, including screening questions prior to the visit, additional usage of staff PPE, and extensive cleaning of exam room while observing appropriate contact time as indicated for disinfecting solutions.  ° ° °Kelsey [name] , 10/29/1945, 75 y.o., female °MRN: [id number] °Patient Care Team  °  Relationship Specialty Notifications Start End  °,  A, DO PCP - General Family Medicine  05/27/15   °Mann, Jyothi, MD Consulting Physician Gastroenterology  12/08/15   °Digby, Donald, MD Consulting Physician Ophthalmology  12/08/15   °Smith, Zachary M, DO Consulting Physician Family Medicine  04/07/18   °Aquino, Karen M, MD Consulting Physician Neurology  01/23/21   ° ° °Chief Complaint  °Patient presents with  ° Rash  °  Pt c/o rash on scalp and face that itches and flakes x 3 weeks  ° °  °Subjective: Pt presents for an OV with complaints of posterior scalp itching and flaking over the last 3 weeks.  She also endorses her second and third toe on her right foot becoming sore and mildly swollen.  She denies fevers or chills.  She is under a significant amount of stress.  She has not changed any hygiene products.  She has not had a history of psoriasis in the past. ° °Depression screen PHQ 2/9 09/06/2021 05/08/2021 08/19/2020 08/17/2020 03/02/2020  °Decreased Interest 0 0 0 0 1  °Down, Depressed, Hopeless 0 0 1 0 0  °PHQ - 2 Score 0 0 1 0 1  °Altered sleeping - - 0 - -  °Tired, decreased energy - - 3 - -  °Change in appetite - - 0 - -  °Feeling bad or failure about yourself  - - 1 - -  °Trouble concentrating - - 2 - -  °Moving slowly or fidgety/restless - - 0 - -  °Suicidal thoughts - - 0 - -  °PHQ-9 Score - - 7 - -  °Some recent data might be hidden  ° ° °Allergies  °Allergen Reactions  ° Penicillins Rash  °  Has patient had a PCN reaction causing immediate rash, facial/tongue/throat swelling, SOB or lightheadedness with  hypotension: no °Has patient had a PCN reaction causing severe rash involving mucus membranes or skin necrosis: yes °Has patient had a PCN reaction that required hospitalization: no °Has patient had a PCN reaction occurring within the last 10 years: no °If all of the above answers are "NO", then may proceed with Cephalosporin use. 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    Uterine fibroid 11/2012   See Dr. Cletis Media note, surgery 11/2012; calcified- multiple   Vaginitis 09/13/2011   Past Surgical History:  Procedure Laterality Date   DILATATION & CURRETTAGE/HYSTEROSCOPY WITH RESECTOCOPE N/A 11/06/2012   Procedure: [place];  Surgeon: Alwyn Pea, MD;  Location: [facility];  Service: Gynecology;  Laterality: N/A;   HERNIA REPAIR  1992   I & D EXTREMITY Left 03/08/2021   Procedure: IRRIGATION AND DEBRIDEMENT OF THUMB AND ARM;  Surgeon: Leanora Cover, MD;  Location: [facility];  Service: Orthopedics;  Laterality: Left;   small intestine hernia  1994   WISDOM TOOTH EXTRACTION     Family History  Problem Relation Age of Onset   Heart disease Mother    Osteoporosis Mother    Cancer Father        prostate    Osteoporosis Sister    Gout Brother    Gout Maternal Grandmother    Heart disease Maternal Grandmother    Alzheimer's disease Maternal Grandfather    Osteoporosis Sister    Allergies as of 09/13/2021       Reactions   Penicillins Rash   Has patient had a PCN reaction causing immediate rash, facial/tongue/throat swelling, SOB or lightheadedness with hypotension: no Has patient had a PCN reaction causing severe rash involving mucus membranes or skin necrosis: yes Has patient had a PCN reaction that required hospitalization: no Has patient had a PCN reaction occurring within the last 10 years: no If all of the above answers are "NO", then may proceed with Cephalosporin use.   Sulfa Antibiotics Rash   Under arm and thighs        Medication List        Accurate as of September 13, 2021 11:59 PM. If you have any questions, ask your nurse or doctor.          STOP taking these medications    Align Extra Strength Caps Stopped by: Howard Pouch, DO       TAKE these medications    atorvastatin 10 MG tablet Commonly known as: LIPITOR Take 1 tablet (10 mg total) by mouth every evening.   clobetasol 0.05 % external solution Commonly known as: TEMOVATE Apply 1 application. topically 2 (two) times daily. Apply to affected area on scalp BID for 2 weeks. Started by: Howard Pouch, DO   omeprazole 40 MG capsule Commonly known as: PRILOSEC Take 1 capsule (40 mg total) by mouth daily.   predniSONE 20 MG tablet Commonly known as: DELTASONE Take 1 tablet (20 mg total) by mouth daily with breakfast. 60 mg x3d, 40 mg x3d, 20 mg x2d, 10 mg x2d Started by: Howard Pouch, DO   Vitamin B-12 500 MCG Subl 1 tab placed under the tongue daily What changed:  how much to take additional instructions   Vitamin D (Ergocalciferol) 1.25 MG (50000 UNIT) Caps capsule Commonly known as: DRISDOL Take 1 capsule (50,000 Units total) by mouth every 7 (seven) days.        All past medical history,  surgical history, allergies, family history, immunizations andmedications were updated in the EMR today and reviewed under the history and medication portions of their EMR.     ROS Negative, with the exception of above mentioned in HPI   Objective:  BP 103/60    Pulse 88    Temp (!) 97.4 F (36.3 C) (Oral)    Ht 5' 5" (1.651 m)  Wt 108 lb (49 kg)    SpO2 100%    BMI 17.97 kg/m  Body mass index is 17.97 kg/m. Physical Exam Vitals and nursing note reviewed.  Constitutional:      General: She is not in acute distress.    Appearance: Normal appearance. She is normal weight. She is not ill-appearing or toxic-appearing.  HENT:     Head:     Comments: Scalp: Posterior scalp with large area of dry scaly plaque lesion.  No hair loss. Eyes:     Extraocular Movements: Extraocular movements intact.     Conjunctiva/sclera: Conjunctivae normal.     Pupils: Pupils are equal, round, and reactive to light.  Musculoskeletal:        General: Swelling and tenderness present.     Right lower leg: No edema.     Left lower leg: No edema.     Comments: Right second and third toe with mild swelling distally.  Mild tenderness.  Skin intact.  Cap refill normal.  Neurological:     Mental Status: She is alert and oriented to person, place, and time. Mental status is at baseline.  Psychiatric:        Mood and Affect: Mood normal.        Behavior: Behavior normal.        Thought Content: Thought content normal.        Judgment: Judgment normal.     No results found. No results found. No results found for this or any previous visit (from the past 24 hour(s)).  Assessment/Plan: Kelsey [name] is a 76 y.o. female present for OV for  Rash/psoriasis Exam is consistent with psoriasis.  She has never had a history of this in the past that she is aware of.  She is under a great deal of stress right now and recently lost her husband. - HLA-B27 antigen -Start prednisone taper -Start topical steroid to  scalp twice daily x2 weeks. Follow-up in 4 weeks.  On follow-up if she is not responding well to treatment, will refer to dermatology.  Toe swelling Possibly related to the psoriasis we will collect HLA-B27 antigens today and discuss either rheumatology or dermatological referral if appropriate. - HLA-B27 antigen    Reviewed expectations re: course of current medical issues. Discussed self-management of symptoms. Outlined signs and symptoms indicating need for more acute intervention. Patient verbalized understanding and all questions were answered. Patient received an After-Visit Summary.    Orders Placed This Encounter  Procedures   HLA-B27 antigen   Meds ordered this encounter  Medications   predniSONE (DELTASONE) 20 MG tablet    Sig: Take 1 tablet (20 mg total) by mouth daily with breakfast. 60 mg x3d, 40 mg x3d, 20 mg x2d, 10 mg x2d    Dispense:  18 tablet    Refill:  0   clobetasol (TEMOVATE) 0.05 % external solution    Sig: Apply 1 application. topically 2 (two) times daily. Apply to affected area on scalp BID for 2 weeks.    Dispense:  50 mL    Refill:  0   Referral Orders  No referral(s) requested today     Note is dictated utilizing voice recognition software. Although note has been proof read prior to signing, occasional typographical errors still can be missed. If any questions arise, please do not hesitate to call for verification.   electronically signed by:  Howard Pouch, DO  [place]

## 2021-09-13 NOTE — Telephone Encounter (Signed)
Spoke with pt regarding medication and instructions.  

## 2021-09-13 NOTE — Telephone Encounter (Signed)
Please inform Kelsey [name] I have called in clobetasol solution to apply to rash on her scalp for 2 weeks (twice a day) ?

## 2021-09-14 LAB — HLA-B27 ANTIGEN: HLA-B27 Antigen: NEGATIVE

## 2021-09-15 NOTE — Progress Notes (Unsigned)
[facility] [place] Phone: [phone] Subjective:    I'm seeing this patient by the request  of:  [name], [name], DO  CC:   [id number]  Annaston ARLO BUFFONE is a 76 y.o. female coming in with complaint of back and neck pain. OMT 08/18/2021. Patient states   Medications patient has been prescribed: None  Taking:         Reviewed prior external information including notes and imaging from previsou exam, outside providers and external EMR if available.   As well as notes that were available from care everywhere and other healthcare systems.  Past medical history, social, surgical and family history all reviewed in electronic medical record.  No pertanent information unless stated regarding to the chief complaint.   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    Uterine fibroid 11/2012   See Dr. Cletis Media note, surgery 11/2012; calcified- multiple   Vaginitis 09/13/2011    Allergies  Allergen Reactions   Penicillins Rash    Has patient had a PCN reaction causing immediate rash, facial/tongue/throat swelling, SOB or lightheadedness with hypotension: no Has patient had a PCN reaction causing severe rash involving mucus membranes or skin necrosis: yes Has patient had a PCN reaction that required  hospitalization: no Has patient had a PCN reaction occurring within the last 10 years: no If all of the above answers are "NO", then may proceed with Cephalosporin use.    Sulfa Antibiotics Rash    Under arm and thighs     Review of Systems:  No headache, visual changes, nausea, vomiting, diarrhea, constipation, dizziness, abdominal pain, skin rash, fevers, chills, night sweats, weight loss, swollen lymph nodes, body aches, joint swelling, chest pain, shortness of breath, mood changes. POSITIVE muscle aches  Objective  There were no vitals taken for this visit.   General: No apparent distress alert and oriented x3 mood and affect normal, dressed appropriately.  HEENT: Pupils equal, extraocular movements intact  Respiratory: Patient's speak in full sentences and does not appear short of breath  Cardiovascular: No lower extremity edema, non tender, no erythema  Neuro: Cranial nerves II through XII are intact, neurovascularly intact in all extremities with 2+ DTRs and 2+ pulses.  Gait normal with good balance and coordination.  MSK:  Non tender with full range of motion and good stability and symmetric strength and tone of shoulders, elbows, wrist, hip, knee and ankles bilaterally.  Back - Normal skin, Spine with normal alignment and no deformity.  No tenderness to vertebral process palpation.  Paraspinous muscles are not tender and without spasm.   Range of motion is full at neck and lumbar sacral regions  Osteopathic findings  C2 flexed rotated and  side bent right C6 flexed rotated and side bent left T3 extended rotated and side bent right inhaled rib T9 extended rotated and side bent left L2 flexed rotated and side bent right Sacrum right on right       Assessment and Plan:    Nonallopathic problems  Decision today to treat with OMT was based on Physical Exam  After verbal consent patient was treated with HVLA, ME, FPR techniques in cervical, rib, thoracic, lumbar, and  sacral  areas  Patient tolerated the procedure well with improvement in symptoms  Patient given exercises, stretches and lifestyle modifications  See medications in patient instructions if given  Patient will follow up in 4-8 weeks      The above documentation has been reviewed and is accurate and complete [name]       Note: This dictation was prepared with Dragon dictation along with smaller phrase technology. Any transcriptional errors that result from this process are unintentional.

## 2021-09-18 ENCOUNTER — Telehealth: Payer: Self-pay | Admitting: Family Medicine

## 2021-09-18 NOTE — Telephone Encounter (Signed)
The HLA-B27 marker is negative. ?I would like her to make a follow-up appointment in 4 weeks on the rash and toe discomfort, so we can see how she responded to the treatment plan and decide if we need to refer to specialty team. ?

## 2021-09-18 NOTE — Telephone Encounter (Signed)
LVM for pt to CB regarding results and to sched appt.  ?

## 2021-09-19 ENCOUNTER — Ambulatory Visit (INDEPENDENT_AMBULATORY_CARE_PROVIDER_SITE_OTHER): Payer: Medicare PPO | Admitting: Family Medicine

## 2021-09-19 ENCOUNTER — Other Ambulatory Visit: Payer: Self-pay

## 2021-09-19 ENCOUNTER — Encounter: Payer: Self-pay | Admitting: Family Medicine

## 2021-09-19 VITALS — BP 88/60 | HR 87 | Ht 65.0 in | Wt 108.0 lb

## 2021-09-19 DIAGNOSIS — M9901 Segmental and somatic dysfunction of cervical region: Secondary | ICD-10-CM

## 2021-09-19 DIAGNOSIS — M9902 Segmental and somatic dysfunction of thoracic region: Secondary | ICD-10-CM | POA: Diagnosis not present

## 2021-09-19 DIAGNOSIS — M9903 Segmental and somatic dysfunction of lumbar region: Secondary | ICD-10-CM | POA: Diagnosis not present

## 2021-09-19 DIAGNOSIS — M533 Sacrococcygeal disorders, not elsewhere classified: Secondary | ICD-10-CM

## 2021-09-19 DIAGNOSIS — M9908 Segmental and somatic dysfunction of rib cage: Secondary | ICD-10-CM | POA: Diagnosis not present

## 2021-09-19 DIAGNOSIS — M9904 Segmental and somatic dysfunction of sacral region: Secondary | ICD-10-CM | POA: Diagnosis not present

## 2021-09-19 MED ORDER — PREDNISONE 20 MG PO TABS: 20.0000 mg | ORAL_TABLET | Freq: Every day | ORAL | 0 refills | Status: DC

## 2021-09-19 NOTE — Patient Instructions (Signed)
Good to see you  ?Refilled prednisone to take on trip incase you need it ?Have a great time ?See me in 4-5 weeks  ?

## 2021-09-19 NOTE — Assessment & Plan Note (Signed)
Significant tightness noted.  Patient has been doing some more activities that I think is potentially giving her more difficulty.  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  Patient did not get the epidural.  Patient does respond well to osteopathic manipulation.  Patient [place] be [place] out of [place] 2 [place] and given prednisone.  Follow-up with me again when patient returns. ?

## 2021-09-19 NOTE — Telephone Encounter (Signed)
LVM for pt to CB regarding results.  

## 2021-09-20 NOTE — Telephone Encounter (Signed)
Spoke with pt regarding labs and instructions.   

## 2021-10-16 NOTE — Progress Notes (Signed)
?Charlann Boxer D.O. ?[facility] ?[place] ?Phone: [phone] ?Subjective:   ?I, [name], am serving as a scribe for Dr. [name]. ? ?This visit occurred during the SARS-CoV-2 public health emergency.  Safety protocols were in place, including screening questions prior to the visit, additional usage of staff PPE, and extensive cleaning of exam room while observing appropriate contact time as indicated for disinfecting solutions.  ? ?I'm seeing this patient by the request  of:  [name], [name], DO ? ?CC: back and neck pain  ? ?[id number]  ?Kelsey [name] is a 76 y.o. female coming in with complaint of back and neck pain. OMT 09/19/2021. Patient states that her  ? ?Medications patient has been prescribed: Prednisone ? ?Taking: ? ? ?  ? ? ? ? ?Reviewed prior external information including notes and imaging from previsou exam, outside providers and external EMR if available.  ? ?As well as notes that were available from care everywhere and other healthcare systems. ? ?Past medical history, social, surgical and family history all reviewed in electronic medical record.  No pertanent information unless stated regarding to the chief complaint.  ? ?Past Medical History:  ?Diagnosis Date  ? Allergy   ? Anemia   ? iron deficiency  ? 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 Bladder prolapse, female, acquired   ? Chicken pox as a child  ? Dysuria 09/13/2011  ? Female bladder prolapse 09/13/2011  ? Fibrocystic breast 09/14/2011  ? GERD (gastroesophageal reflux disease)   ? H/O measles   ? H/O mumps   ? Hyperlipidemia   ? 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 Osteoarthrosis   ? right shoulder  ? Osteoporosis   ? reclast in Oct  ? Ovarian cyst 09/14/2011  ? Renal cyst left  ? 3 cm  ? [facility] spotted fever) 2017  ? Situational anxiety 03/19/2014  ? R/t grieving process & caregiver burden for husband who has dementia, alzheimer's.   ? Uterine fibroid 11/2012  ? See Dr. Cletis Media note,  surgery 11/2012; calcified- multiple  ? Vaginitis 09/13/2011  ?  ?Allergies  ?Allergen Reactions  ? Penicillins Rash  ?  Has patient had a PCN reaction causing immediate rash, facial/tongue/throat swelling, SOB or lightheadedness with hypotension: no ?Has patient had a PCN reaction causing severe rash involving mucus membranes or skin necrosis: yes ?Has patient had a PCN reaction that required hospitalization: no ?Has patient had a PCN reaction occurring within the last 10 years: no ?If all of the above answers are "NO", then may proceed with Cephalosporin use. ?  ? Sulfa Antibiotics Rash  ?  Under arm and thighs  ? ? ? ?Review of Systems: ? No headache, visual changes, nausea, vomiting, diarrhea, constipation, dizziness, abdominal pain, skin rash, fevers, chills, night sweats, weight loss, swollen lymph nodes, body aches, joint swelling, chest pain, shortness of breath, mood changes. POSITIVE muscle aches ? ?Objective  ?Blood pressure 94/68, pulse 84, height '5\' 5"'$  (1.651 m), weight 108 lb (49 kg), SpO2 97 %. ?  ?General: No apparent distress alert and oriented x3 mood and affect normal, dressed appropriately.  ?HEENT: Pupils equal, extraocular movements intact  ?Respiratory: Patient's speak in full sentences and does not appear short of breath  ?Cardiovascular: No lower extremity edema, non tender, no erythema  ?Loss of lordosis of the lumbar spine.  Tightness noted in the paraspinal musculature.  Mild increase in discomfort over the sacroiliac joint on the left side today.  Tightness  noted in the sacral lumbar junction as well. ? ? ? ?Osteopathic findings ? ?C2 flexed rotated and side bent right ?C6 flexed rotated and side bent left ?T6 extended rotated and side bent left inhaled rib ?L1 flexed rotated and side bent right ?Sacrum left on left ? ? ? ? ?  ?Assessment and Plan: ? ?SI (sacroiliac) joint dysfunction ?Mild increase in the pain over the sacroiliac joint.  Patient noted does have the arthritic changes noted  as well in the lumbar spine with some spinal stenosis.  Patient was able to travel without any significant difficulty.  We discussed icing regimen and home exercises, which activities to do which ones to avoid, increase activity slowly.  Follow-up with me again in 6 to 8 weeks.  Patient should be traveling to [place] in the interim ?  ? ?Nonallopathic problems ? ?Decision today to treat with OMT was based on Physical Exam ? ?After verbal consent patient was treated with HVLA, ME, FPR techniques in cervical, rib, thoracic, lumbar, and sacral  areas ? ?Patient tolerated the procedure well with improvement in symptoms ? ?Patient given exercises, stretches and lifestyle modifications ? ?See medications in patient instructions if given ? ?Patient will follow up in 4-8 weeks ? ?  ? ?The above documentation has been reviewed and is accurate and complete Lyndal Pulley, DO ? ? ? ?  ? ? Note: This dictation was prepared with Dragon dictation along with smaller phrase technology. Any transcriptional errors that result from this process are unintentional.    ?  ?  ? ?

## 2021-10-17 ENCOUNTER — Ambulatory Visit (INDEPENDENT_AMBULATORY_CARE_PROVIDER_SITE_OTHER): Payer: Medicare PPO | Admitting: Family Medicine

## 2021-10-17 ENCOUNTER — Encounter: Payer: Self-pay | Admitting: Family Medicine

## 2021-10-17 VITALS — BP 90/53 | HR 73 | Temp 98.0°F | Ht 65.0 in | Wt 108.0 lb

## 2021-10-17 VITALS — BP 94/68 | HR 84 | Ht 65.0 in | Wt 108.0 lb

## 2021-10-17 DIAGNOSIS — M9903 Segmental and somatic dysfunction of lumbar region: Secondary | ICD-10-CM

## 2021-10-17 DIAGNOSIS — M9908 Segmental and somatic dysfunction of rib cage: Secondary | ICD-10-CM | POA: Diagnosis not present

## 2021-10-17 DIAGNOSIS — M9902 Segmental and somatic dysfunction of thoracic region: Secondary | ICD-10-CM

## 2021-10-17 DIAGNOSIS — M9904 Segmental and somatic dysfunction of sacral region: Secondary | ICD-10-CM

## 2021-10-17 DIAGNOSIS — M9901 Segmental and somatic dysfunction of cervical region: Secondary | ICD-10-CM | POA: Diagnosis not present

## 2021-10-17 DIAGNOSIS — R21 Rash and other nonspecific skin eruption: Secondary | ICD-10-CM | POA: Diagnosis not present

## 2021-10-17 DIAGNOSIS — M533 Sacrococcygeal disorders, not elsewhere classified: Secondary | ICD-10-CM

## 2021-10-17 MED ORDER — PERMETHRIN 5 % EX CREA: TOPICAL_CREAM | CUTANEOUS | 0 refills | Status: DC

## 2021-10-17 MED ORDER — CLOBETASOL PROPIONATE 0.05 % EX SOLN: 1.0000 "application " | Freq: Two times a day (BID) | CUTANEOUS | 0 refills | Status: DC

## 2021-10-17 MED ORDER — PREDNISONE 20 MG PO TABS: 20.0000 mg | ORAL_TABLET | Freq: Every day | ORAL | 0 refills | Status: DC

## 2021-10-17 NOTE — Patient Instructions (Signed)
Refilled mm relaxer ?See me again in 6-8 weeks ?

## 2021-10-17 NOTE — Progress Notes (Signed)
? ? ? ?This visit occurred during the SARS-CoV-2 public health emergency.  Safety protocols were in place, including screening questions prior to the visit, additional usage of staff PPE, and extensive cleaning of exam room while observing appropriate contact time as indicated for disinfecting solutions.  ? ? ?Kelsey [name] , 27-Oct-1945, 76 y.o., female ?MRN: [id number] ?Patient Care Team  ?  Relationship Specialty Notifications Start End  ?[name], DO PCP - General Family Medicine  05/27/15   ?[name], MD Consulting Physician Gastroenterology  12/08/15   ?Calvert Cantor, MD Consulting Physician Ophthalmology  12/08/15   ?Lyndal Pulley, DO Consulting Physician Family Medicine  04/07/18   ?Cameron Sprang, MD Consulting Physician Neurology  01/23/21   ? ? ?Chief Complaint  ?Patient presents with  ? Rash  ?  F/u; worsening  ? ?  ?Subjective: Pt presents for follow-up visit on rash.  Patient was seen approximately 4 weeks ago with complaints of posterior scalp itching and flaking.  She was provided with topical steroid solution to use which she states cleared up the flaking but she is still having itching in this location.  She also notices increased itching over her back, especially lower back.  She has noticed a rash on her back and small patch on her abdomen.  She reports her toes are no longer red or tender, but they are still swollen on the end. ?She has had a neg ANA, IFA comprehensive, RF, CCP and HLAB27 testing.  ? ?Since being seen last she did travel to [place].  She thinks the newer itching and skin manifestations occurred before she left, but she is not certain. ? ?Prior note: ?over the last 3 weeks.  She also endorses her second and third toe on her right foot becoming sore and mildly swollen.  She denies fevers or chills.  She is under a significant amount of stress.  She has not changed any hygiene products.  She has not had a history of psoriasis in the past. ? ? ?  10/17/2021  ?  8:46 AM  09/06/2021  ? 11:59 AM 05/08/2021  ?  9:36 AM 08/19/2020  ? 10:28 AM 08/17/2020  ?  9:50 AM  ?Depression screen PHQ 2/9  ?Decreased Interest 0 0 0 0 0  ?Down, Depressed, Hopeless 0 0 0 1 0  ?PHQ - 2 Score 0 0 0 1 0  ?Altered sleeping    0   ?Tired, decreased energy    3   ?Change in appetite    0   ?Feeling bad or failure about yourself     1   ?Trouble concentrating    2   ?Moving slowly or fidgety/restless    0   ?Suicidal thoughts    0   ?PHQ-9 Score    7   ? ? ?Allergies  ?Allergen Reactions  ? Penicillins Rash  ?  Has patient had a PCN reaction causing immediate rash, facial/tongue/throat swelling, SOB or lightheadedness with hypotension: no ?Has patient had a PCN reaction causing severe rash involving mucus membranes or skin necrosis: yes ?Has patient had a PCN reaction that required hospitalization: no ?Has patient had a PCN reaction occurring within the last 10 years: no ?If all of the above answers are "NO", then may proceed with Cephalosporin use. ?  ? Sulfa Antibiotics Rash  ?  Under arm and thighs  ? ?Social History  ? ?Social History Narrative  ? Right handed   ? Lives with husband   ? ?  Past Medical History:  ?Diagnosis Date  ? Allergy   ? Anemia   ? iron deficiency  ? 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 Bladder prolapse, female, acquired   ? Chicken pox as a child  ? Dysuria 09/13/2011  ? Female bladder prolapse 09/13/2011  ? Fibrocystic breast 09/14/2011  ? GERD (gastroesophageal reflux disease)   ? H/O measles   ? H/O mumps   ? Hyperlipidemia   ? 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 Osteoarthrosis   ? right shoulder  ? Osteoporosis   ? reclast in Oct  ? Ovarian cyst 09/14/2011  ? Renal cyst left  ? 3 cm  ? RMSF [facility] spotted fever) 2017  ? Situational anxiety 03/19/2014  ? R/t grieving process & caregiver burden for husband who has dementia, alzheimer's.   ? Uterine fibroid 11/2012  ? See Dr. Cletis Media note, surgery 11/2012; calcified- multiple  ? Vaginitis 09/13/2011  ? ?Past Surgical History:  ?Procedure Laterality  Date  ? DILATATION & CURRETTAGE/HYSTEROSCOPY WITH RESECTOCOPE N/A 11/06/2012  ? Procedure: DILATATION & CURETTAGE/HYSTEROSCOPY WITH RESECTOCOPE;  Surgeon: Alwyn Pea, MD;  Location: [facility];  Service: Gynecology;  Laterality: N/A;  ? HERNIA REPAIR  1992  ? I & D EXTREMITY Left 03/08/2021  ? Procedure: IRRIGATION AND DEBRIDEMENT OF THUMB AND ARM;  Surgeon: Leanora Cover, MD;  Location: [facility];  Service: Orthopedics;  Laterality: Left;  ? small intestine hernia  1994  ? WISDOM TOOTH EXTRACTION    ? ?Family History  ?Problem Relation Age of Onset  ? Heart disease Mother   ? Osteoporosis Mother   ? Cancer Father   ?     prostate  ? Osteoporosis Sister   ? Gout Brother   ? Gout Maternal Grandmother   ? Heart disease Maternal Grandmother   ? Alzheimer's disease Maternal Grandfather   ? Osteoporosis Sister   ? ?Allergies as of 10/17/2021   ? ?   Reactions  ? Penicillins Rash  ? Has patient had a PCN reaction causing immediate rash, facial/tongue/throat swelling, SOB or lightheadedness with hypotension: no ?Has patient had a PCN reaction causing severe rash involving mucus membranes or skin necrosis: yes ?Has patient had a PCN reaction that required hospitalization: no ?Has patient had a PCN reaction occurring within the last 10 years: no ?If all of the above answers are "NO", then may proceed with Cephalosporin use.  ? Sulfa Antibiotics Rash  ? Under arm and thighs  ? ?  ? ?  ?Medication List  ?  ? ?  ? Accurate as of October 17, 2021 11:59 PM. If you have any questions, ask your nurse or doctor.  ?  ?  ? ?  ? ?STOP taking these medications   ? ?Vitamin B-12 500 MCG Subl ?Stopped by: Howard Pouch, DO ?  ? ?  ? ?TAKE these medications   ? ?atorvastatin 10 MG tablet ?Commonly known as: LIPITOR ?Take 1 tablet (10 mg total) by mouth every evening. ?  ?clobetasol 0.05 % external solution ?Commonly known as: TEMOVATE ?Apply 1 application. topically 2 (two) times daily. Apply to affected area on scalp BID for 2 weeks. ?  ?omeprazole  40 MG capsule ?Commonly known as: PRILOSEC ?Take 1 capsule (40 mg total) by mouth daily. ?  ?permethrin 5 % cream ?Commonly known as: Elimite ?Apply head to toe (avoid around eyes) and leave on for 12 hours, then wash off. ?Started by: Howard Pouch, DO ?  ?predniSONE 20 MG tablet ?Commonly known as: DELTASONE ?  Take 1 tablet (20 mg total) by mouth daily with breakfast. ?What changed: additional instructions ?Changed by: Lyndal Pulley, DO ?  ?Vitamin D (Ergocalciferol) 1.25 MG (50000 UNIT) Caps capsule ?Commonly known as: DRISDOL ?Take 1 capsule (50,000 Units total) by mouth every 7 (seven) days. ?  ? ?  ? ? ?All past medical history, surgical history, allergies, family history, immunizations andmedications were updated in the EMR today and reviewed under the history and medication portions of their EMR.    ? ?ROS ?Negative, with the exception of above mentioned in HPI ? ? ?Objective:  ?BP (!) 90/53   Pulse 73   Temp 98 ?F (36.7 ?C) (Oral)   Ht '5\' 5"'$  (1.651 m)   Wt 108 lb (49 kg)   SpO2 99%   BMI 17.97 kg/m?  ?Body mass index is 17.97 kg/m?Marland Kitchen ?Physical Exam ?Vitals and nursing note reviewed.  ?Constitutional:   ?   General: She is not in acute distress. ?   Appearance: Normal appearance. She is normal weight. She is not ill-appearing or toxic-appearing.  ?HENT:  ?   Head:  ?   Comments: Posterior scalp no longer has a dry scaly plaque lesion present.  Scalp appears normal  ?Eyes:  ?   Extraocular Movements: Extraocular movements intact.  ?   Conjunctiva/sclera: Conjunctivae normal.  ?   Pupils: Pupils are equal, round, and reactive to light.  ?Musculoskeletal:     ?   General: Swelling present. No tenderness.  ?   Right lower leg: No edema.  ?   Left lower leg: No edema.  ?   Comments: Right second and third toe with mild swelling distally.  No erythema and no tenderness.  Skin intact.  Cap refill normal.  ?Skin: ?   Findings: Rash (Excoriations along lower back from patient scratching.  Small singular to small  grouped raised red fine rash present.) present.  ?Neurological:  ?   Mental Status: She is alert and oriented to person, place, and time. Mental status is at baseline.  ?Psychiatric:     ?   Mood and Affect: Mood

## 2021-10-17 NOTE — Assessment & Plan Note (Signed)
Mild increase in the pain over the sacroiliac joint.  Patient noted does have the arthritic changes noted as well in the lumbar spine with some spinal stenosis.  Patient was able to travel without any significant difficulty.  We discussed icing regimen and home exercises, which activities to do which ones to avoid, increase activity slowly.  Follow-up with me again in 6 to 8 weeks.  Patient should be traveling to [place] in the interim ?

## 2021-10-18 ENCOUNTER — Ambulatory Visit: Payer: Medicare PPO | Admitting: Family Medicine

## 2021-10-20 ENCOUNTER — Telehealth: Payer: Self-pay

## 2021-10-20 NOTE — Telephone Encounter (Signed)
LVM for pt to CB regarding medication.  ?

## 2021-10-20 NOTE — Telephone Encounter (Signed)
Please advise 

## 2021-10-20 NOTE — Telephone Encounter (Signed)
Application is once, may repeat in 2 weeks if needed. ?It is applied Head To Toe, meaning hairline to toes, with avoidance around the eyes. ?

## 2021-10-20 NOTE — Telephone Encounter (Signed)
Patient has questions are new medication prescribed by Dr. [name].  She doesn't remember how often she has to use cream.  She knows apply and leave on for 12 hours, then wash off.  Does she repeat the process and for how many days? ?Also dosage says "head to toe", she thought it was only "neck to toe".   ? ?Please advise.  [phone] ?

## 2021-10-23 DIAGNOSIS — L218 Other seborrheic dermatitis: Secondary | ICD-10-CM | POA: Diagnosis not present

## 2021-10-23 DIAGNOSIS — L853 Xerosis cutis: Secondary | ICD-10-CM | POA: Diagnosis not present

## 2021-10-23 NOTE — Telephone Encounter (Signed)
Spoke with pt regarding medication and instructions. Pt was seen by derm and they recommended pt to repeat the medication in 1 week instead of 2. I told pt I will let PCP know of derm recommendations.  ?

## 2021-11-15 NOTE — Progress Notes (Signed)
?Charlann Boxer D.O. ?[facility] ?[place] ?Phone: [phone] ?Subjective:   ?I, Kelsey [name], am serving as a scribe for Dr. [name]. ? ?This visit occurred during the SARS-CoV-2 public health emergency.  Safety protocols were in place, including screening questions prior to the visit, additional usage of staff PPE, and extensive cleaning of exam room while observing appropriate contact time as indicated for disinfecting solutions.  ? ? ?I'm seeing this patient by the request  of:  [name], Kelsey A, DO ? ?CC: back and neck pain  ? ?[id number]  ?Kelsey [name] is a 76 y.o. female coming in with complaint of back and neck pain. OMT on 10/17/2021. Patient states that she would like to discuss itchy scalp and lower back. Scalp irritation started prior to traveling. Is now having lumbar spine itching since returning. Was referred to dermatology and was given ketoconazole cream and triamcinolone cream. Last week tried triple antibiotic ointment on open skin on scalp. Also used prednisone which did help. Tried one of her husbands antibiotics. Dermatology feels like skin issue is eczema but PCP thinks she has psoriasis. Patient said that sores did go away but came back. Also notes R great toe swelling and PCP said that this symptom is linked to psoriasis.  ? ?Back and neck ? ?Medications patient has been prescribed: Prednisone ? ?Taking: ? ? ?  ? ? ? ? ?Reviewed prior external information including notes and imaging from previsou exam, outside providers and external EMR if available.  ? ?As well as notes that were available from care everywhere and other healthcare systems. ? ?Past medical history, social, surgical and family history all reviewed in electronic medical record.  No pertanent information unless stated regarding to the chief complaint.  ? ?Past Medical History:  ?Diagnosis Date  ? Allergy   ? Anemia   ? iron deficiency  ? 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  Bladder prolapse, female, acquired   ? Chicken pox as a child  ? Dysuria 09/13/2011  ? Female bladder prolapse 09/13/2011  ? Fibrocystic breast 09/14/2011  ? GERD (gastroesophageal reflux disease)   ? H/O measles   ? H/O mumps   ? Hyperlipidemia   ? 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 Osteoarthrosis   ? right shoulder  ? Osteoporosis   ? reclast in Oct  ? Ovarian cyst 09/14/2011  ? Renal cyst left  ? 3 cm  ? [facility] spotted fever) 2017  ? Situational anxiety 03/19/2014  ? R/t grieving process & caregiver burden for husband who has dementia, alzheimer's.   ? Uterine fibroid 11/2012  ? See Dr. Cletis Media note, surgery 11/2012; calcified- multiple  ? Vaginitis 09/13/2011  ?  ?Allergies  ?Allergen Reactions  ? Penicillins Rash  ?  Has patient had a PCN reaction causing immediate rash, facial/tongue/throat swelling, SOB or lightheadedness with hypotension: no ?Has patient had a PCN reaction causing severe rash involving mucus membranes or skin necrosis: yes ?Has patient had a PCN reaction that required hospitalization: no ?Has patient had a PCN reaction occurring within the last 10 years: no ?If all of the above answers are "NO", then may proceed with Cephalosporin use. ?  ? Sulfa Antibiotics Rash  ?  Under arm and thighs  ? ? ? ?Review of Systems: ? No headache, visual changes, nausea, vomiting, diarrhea, constipation, dizziness, abdominal pain, skin rash, fevers, chills, night sweats, weight loss, swollen lymph nodes, body aches, joint swelling, chest pain, shortness of breath, mood  changes. POSITIVE muscle aches ? ?Objective  ?Blood pressure 102/74, pulse 91, height '5\' 5"'$  (1.651 m), weight 109 lb (49.4 kg), SpO2 95 %. ?  ?General: No apparent distress alert and oriented x3 mood and affect normal, dressed appropriately.  ?HEENT: Pupils equal, extraocular movements intact  ?Respiratory: Patient's speak in full sentences and does not appear short of breath  ?Cardiovascular: No lower extremity edema, non tender, no erythema   ?Tender to palpation more in the thoracolumbar juncture.  Patient still has some loss of lordosis with some mild degenerative scoliosis noted.  Patient does have some limited sidebending bilaterally. ?Patient does have some very mild dry skin and some eczema noted of the back and minorly on the scalp ? ?Osteopathic findings ? ?C2 flexed rotated and side bent right ?C7 flexed rotated and side bent left ?T3 extended rotated and side bent right inhaled rib ?T8 extended rotated and side bent left ?L2 flexed rotated and side bent right ?L5 flexed rotated and side bent left ?Sacrum right on right ? ? ? ? ?  ?Assessment and Plan: ? ?Degenerative lumbar spinal stenosis ?Chronic, with mild exacerbation.  Discussed icing regimen and home exercises again.  Not taking any medications at the moment.  Have done muscle relaxers and gabapentin in the past.  Patient is staying more active.  Having to do less movement than previously.  Follow-up with me again in 6 to 8 weeks. ?  ? ?Nonallopathic problems ? ?Decision today to treat with OMT was based on Physical Exam ? ?After verbal consent patient was treated with HVLA, ME, FPR techniques in cervical, rib, thoracic, lumbar, and sacral  areas ? ?Patient tolerated the procedure well with improvement in symptoms ? ?Patient given exercises, stretches and lifestyle modifications ? ?See medications in patient instructions if given ? ?Patient will follow up in 4-8 weeks ? ?  ? ? ?The above documentation has been reviewed and is accurate and complete Lyndal Pulley, DO ? ? ? ?  ? ? Note: This dictation was prepared with Dragon dictation along with smaller phrase technology. Any transcriptional errors that result from this process are unintentional.    ?  ?  ? ?

## 2021-11-16 ENCOUNTER — Ambulatory Visit (INDEPENDENT_AMBULATORY_CARE_PROVIDER_SITE_OTHER): Payer: Medicare PPO | Admitting: Family Medicine

## 2021-11-16 VITALS — BP 102/74 | HR 91 | Ht 65.0 in | Wt 109.0 lb

## 2021-11-16 DIAGNOSIS — M9901 Segmental and somatic dysfunction of cervical region: Secondary | ICD-10-CM | POA: Diagnosis not present

## 2021-11-16 DIAGNOSIS — M9904 Segmental and somatic dysfunction of sacral region: Secondary | ICD-10-CM | POA: Diagnosis not present

## 2021-11-16 DIAGNOSIS — M9903 Segmental and somatic dysfunction of lumbar region: Secondary | ICD-10-CM

## 2021-11-16 DIAGNOSIS — M9908 Segmental and somatic dysfunction of rib cage: Secondary | ICD-10-CM

## 2021-11-16 DIAGNOSIS — M48061 Spinal stenosis, lumbar region without neurogenic claudication: Secondary | ICD-10-CM

## 2021-11-16 DIAGNOSIS — H04123 Dry eye syndrome of bilateral lacrimal glands: Secondary | ICD-10-CM | POA: Diagnosis not present

## 2021-11-16 DIAGNOSIS — H02054 Trichiasis without entropian left upper eyelid: Secondary | ICD-10-CM | POA: Diagnosis not present

## 2021-11-16 DIAGNOSIS — M9902 Segmental and somatic dysfunction of thoracic region: Secondary | ICD-10-CM

## 2021-11-16 MED ORDER — PREDNISONE 20 MG PO TABS: 20.0000 mg | ORAL_TABLET | Freq: Every day | ORAL | 0 refills | Status: DC

## 2021-11-16 NOTE — Patient Instructions (Signed)
Great to see you  ?Have a great trip  ?Try to use aveeno after the other cream  ?Prednisone daily for 7 days  ?

## 2021-11-16 NOTE — Assessment & Plan Note (Signed)
Chronic, with mild exacerbation.  Discussed icing regimen and home exercises again.  Not taking any medications at the moment.  Have done muscle relaxers and gabapentin in the past.  Patient is staying more active.  Having to do less movement than previously.  Follow-up with me again in 6 to 8 weeks. ?

## 2021-11-24 ENCOUNTER — Ambulatory Visit: Payer: Medicare PPO | Admitting: Family Medicine

## 2021-12-20 NOTE — Progress Notes (Signed)
[facility] Phone: [phone] Subjective:   [name], am serving as a scribe for Dr. [name].  I'm seeing this patient by the request  of:  [name], Kelsey [name]  CC: Low back pain follow-up  RDE:[id number]  Kelsey [name] is a 76 y.o. female coming in with complaint of back and neck pain. OMT on 11/16/2021. Patient states that she is the same as last visit.  Patient did travel to [place].  Feels like she did relatively well with the traveling without any significant discomfort more than usual.  Was able to walk around there with no trouble.  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    Uterine fibroid 11/2012   See Dr. Cletis Media note, surgery 11/2012; calcified- multiple   Vaginitis 09/13/2011    Allergies  Allergen Reactions   Penicillins Rash    Has patient had a PCN reaction causing immediate rash, facial/tongue/throat swelling, SOB or lightheadedness with hypotension: no Has patient had a PCN reaction causing severe rash involving mucus membranes or skin necrosis: yes Has patient had a PCN reaction that required hospitalization: no Has patient had a PCN reaction occurring  within the last 10 years: no If all of the above answers are "NO", then may proceed with Cephalosporin use.    Sulfa Antibiotics Rash    Under arm and thighs     Review of Systems:  No headache, visual changes, nausea, vomiting, diarrhea, constipation, dizziness, abdominal pain, skin rash, fevers, chills, night sweats, weight loss, swollen lymph nodes, body aches, joint swelling, chest pain, shortness of breath, mood changes. POSITIVE muscle aches  Objective  Blood pressure 98/70, pulse 73, height '5\' 5"'$  (1.651 m), weight 109 lb (49.4 kg).   General: No apparent distress alert and oriented x3 mood and affect normal, dressed appropriately.  HEENT: Pupils equal, extraocular movements intact  Respiratory: Patient's speak in full sentences and does not appear short of breath  Cardiovascular: No lower extremity edema, non tender, no erythema  Gait mild antalgic MSK:  Back back exam has severe loss of lordosis.  Still has only 5 degrees of extension.  Good flexion noted.  Patient does have tightness with Corky Sox right greater than left.  Osteopathic findings   T9 extended rotated and side bent left L2 flexed rotated and side bent right L5 flexed rotated and side bent left Sacrum right on right       Assessment and Plan:  Degenerative lumbar spinal stenosis Degenerative disc disease.  Patient does have a chronic problem of this nature.  Still not taking medications on a regular basis and still wants to avoid them.  Discussed icing regimen and home exercises.  Increase activity slowly.  Follow-up again in 6 to 8 weeks.    Nonallopathic problems  Decision today to treat with OMT was based on Physical Exam  After verbal consent patient was treated with HVLA, ME, FPR techniques in  thoracic, lumbar, and sacral  areas  Patient tolerated the procedure well with improvement in symptoms  Patient given exercises, stretches and lifestyle modifications  See medications in patient  instructions if given  Patient will follow up in 4-8 weeks             Note: This dictation was prepared with Dragon dictation along with smaller phrase technology. Any transcriptional errors that result from this process are unintentional.

## 2021-12-21 ENCOUNTER — Ambulatory Visit (INDEPENDENT_AMBULATORY_CARE_PROVIDER_SITE_OTHER): Payer: Medicare PPO | Admitting: Family Medicine

## 2021-12-21 VITALS — BP 98/70 | HR 73 | Ht 65.0 in | Wt 109.0 lb

## 2021-12-21 DIAGNOSIS — M9902 Segmental and somatic dysfunction of thoracic region: Secondary | ICD-10-CM | POA: Diagnosis not present

## 2021-12-21 DIAGNOSIS — M9903 Segmental and somatic dysfunction of lumbar region: Secondary | ICD-10-CM | POA: Diagnosis not present

## 2021-12-21 DIAGNOSIS — M9904 Segmental and somatic dysfunction of sacral region: Secondary | ICD-10-CM

## 2021-12-21 DIAGNOSIS — M48061 Spinal stenosis, lumbar region without neurogenic claudication: Secondary | ICD-10-CM | POA: Diagnosis not present

## 2021-12-21 NOTE — Patient Instructions (Signed)
Good to see you! Glad you made it to [place] See Dr. Glennon Mac in 4 weeks and me in 8 weeks

## 2021-12-21 NOTE — Assessment & Plan Note (Signed)
Degenerative disc disease.  Patient does have a chronic problem of this nature.  Still not taking medications on a regular basis and still wants to avoid them.  Discussed icing regimen and home exercises.  Increase activity slowly.  Follow-up again in 6 to 8 weeks.

## 2022-01-10 ENCOUNTER — Ambulatory Visit (HOSPITAL_BASED_OUTPATIENT_CLINIC_OR_DEPARTMENT_OTHER)
Admission: RE | Admit: 2022-01-10 | Discharge: 2022-01-10 | Disposition: A | Discharge status: Home / Self Care | Payer: Medicare PPO | Source: Ambulatory Visit | Attending: Family Medicine | Admitting: Family Medicine

## 2022-01-10 ENCOUNTER — Telehealth: Payer: Self-pay

## 2022-01-10 ENCOUNTER — Encounter: Payer: Self-pay | Admitting: Family Medicine

## 2022-01-10 ENCOUNTER — Ambulatory Visit (INDEPENDENT_AMBULATORY_CARE_PROVIDER_SITE_OTHER): Payer: Medicare PPO | Admitting: Family Medicine

## 2022-01-10 VITALS — BP 99/63 | HR 75 | Temp 98.1°F | Ht 65.0 in | Wt 110.2 lb

## 2022-01-10 DIAGNOSIS — R42 Dizziness and giddiness: Secondary | ICD-10-CM

## 2022-01-10 DIAGNOSIS — S0083XA Contusion of other part of head, initial encounter: Secondary | ICD-10-CM

## 2022-01-10 DIAGNOSIS — M542 Cervicalgia: Secondary | ICD-10-CM

## 2022-01-10 NOTE — Progress Notes (Signed)
OFFICE VISIT  01/10/2022  CC:  Chief Complaint  Patient presents with   Kelsey [name]; pt states dizziness as well yesterday. Mainly neck pain but has not taken any meds. Bump on her head, tenderness to touch    Patient is a 76 y.o. female who presents for a recent fall.  HPI: Patient works out in the extreme heat all day 2 days ago.  Then in the middle of the night that night she got up to urinate and felt dizzy.  She fell over and hit her head on a dresser.  No loss of consciousness but she remembers feeling a bit stunned for a minute.  She was able to eventually return to sleep and since then has felt some pain and stiffness in her neck that occasionally radiates over the top of the right shoulder no paresthesias or arm weakness.  She has no generalized headache but has some focal pain in the left frontal area where she hit her head.  No acute hearing or vision abnormalities.  No focal weakness, no tremor.  She has had no further dizziness. No fever, no nausea, no vomiting.   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    Uterine fibroid 11/2012   See Dr. Cletis Media note, surgery 11/2012; calcified- multiple   Vaginitis 09/13/2011    Past Surgical History:  Procedure Laterality Date   DILATATION & CURRETTAGE/HYSTEROSCOPY WITH RESECTOCOPE N/A 11/06/2012   Procedure:  [place];  Surgeon: Alwyn Pea, MD;  Location: [facility];  Service: Gynecology;  Laterality: N/A;   HERNIA REPAIR  1992   I & D EXTREMITY Left 03/08/2021   Procedure: IRRIGATION AND DEBRIDEMENT OF THUMB AND ARM;  Surgeon: Leanora Cover, MD;  Location: [facility];  Service: Orthopedics;  Laterality: Left;   small intestine hernia  1994   WISDOM TOOTH EXTRACTION      Outpatient Medications Prior to Visit  Medication Sig Dispense Refill   atorvastatin (LIPITOR) 10 MG tablet Take 1 tablet (10 mg total) by mouth every evening. 90 tablet 3   omeprazole (PRILOSEC) 40 MG capsule Take 1 capsule (40 mg total) by mouth daily. 90 capsule 1   Vitamin D, Ergocalciferol, (DRISDOL) 1.25 MG (50000 UNIT) CAPS capsule Take 1 capsule (50,000 Units total) by mouth every 7 (seven) days. 12 capsule 3   clobetasol (TEMOVATE) 0.05 % external solution Apply 1 application. topically 2 (two) times daily. Apply to affected area on scalp BID for 2 weeks. (Patient not taking: Reported on 01/10/2022) 50 mL 0   permethrin (ELIMITE) 5 % cream Apply head to toe (avoid around eyes) and leave on for 12 hours, then wash off. (Patient not taking: Reported on 01/10/2022) 60 g 0  predniSONE (DELTASONE) 20 MG tablet Take 1 tablet (20 mg total) by mouth daily with breakfast. (Patient not taking: Reported on 01/10/2022) 7 tablet 0   No facility-administered medications prior to visit.    Allergies  Allergen Reactions   Penicillins Rash    Has patient had a PCN reaction causing immediate rash, facial/tongue/throat swelling, SOB or lightheadedness with hypotension: no Has patient had a PCN reaction causing severe rash involving mucus membranes or skin necrosis: yes Has patient had a PCN reaction that required hospitalization: no Has patient had a PCN reaction occurring within the last 10 years: no If all of the above answers are "NO", then may proceed with Cephalosporin use.    Sulfa Antibiotics  Rash    Under arm and thighs    ROS As per HPI  PE:    01/10/2022    8:46 AM 12/21/2021   12:54 PM 11/16/2021    1:31 PM  Vitals with BMI  Height '5\' 5"'$  '5\' 5"'$  '5\' 5"'$   Weight 110 lbs 3 oz 109 lbs 109 lbs  BMI 18.34 96.75 91.63  Systolic 99 98 846  Diastolic 63 70 74  Pulse 75 73 91     Physical Exam  Gen: Alert, well appearing.  Patient is oriented to person, place, time, and situation. AFFECT: pleasant, lucid thought and speech. She has a small hematoma and ecchymoses in the left frontal hairline. Mild tenderness over this region but no tenderness of the temple or the remainder of the skull. Neck nontender but with stiffness and limited range of motion, particularly with rotation and lateral bending. CV: RRR, no m/r/g.   LUNGS: CTA bilat, nonlabored resps, good aeration in all lung fields. Neuro: CN 2-12 intact bilaterally, strength 5/5 in proximal and distal upper extremities and lower extremities bilaterally.   No tremor.  No disdiadochokinesis.  No ataxia.  No pronator drift.   LABS:  Last CBC Lab Results  Component Value Date   WBC 8.3 03/08/2021   HGB 12.4 03/08/2021   HCT 38.6 03/08/2021   MCV 97.5 03/08/2021   MCH 31.3 03/08/2021   RDW 13.5 03/08/2021   PLT 233 65/99/3570   Last metabolic panel Lab Results  Component Value Date   GLUCOSE 80 08/19/2020   NA 138 08/19/2020   K 4.3 08/19/2020   CL 101 08/19/2020   CO2 29 08/19/2020   BUN 15 08/19/2020   CREATININE 0.62 08/19/2020   GFRNONAA >60 08/05/2016   CALCIUM 9.0 08/19/2020   PHOS 3.6 11/28/2012   PROT 6.9 11/23/2020   ALBUMIN 4.4 11/23/2020   BILITOT 0.7 11/23/2020   ALKPHOS 83 11/23/2020   AST 25 11/23/2020   ALT 21 11/23/2020   ANIONGAP 6 08/05/2016   IMPRESSION AND PLAN:  #1 orthostatic dizziness, suspect dehydration. She has hydrated well since then and has no further episodes.  #2 head contusion.  She may take Tylenol 1000 mg every 6 hours as needed. Low suspicion of skull fracture  or intracranial injury.  #3 acute neck pain; suspect musculoskeletal neck strain. Check plain films of neck today  An After Visit Summary was printed and given to the patient.  FOLLOW UP: Return if symptoms worsen or fail to improve.  Signed:  Crissie Sickles, MD           01/10/2022

## 2022-01-10 NOTE — Telephone Encounter (Signed)
Pt scheduled with provider on 7/5  [facility] RECORD [facility]urse Patient Name: Kelsey [name] Gender: Female DOB: 1945/10/20 Age: 75 Y 2 M 9 D Return Phone Number: [phone] (Primary) Address: City/State/Zip: [place] [address] Client [facility] - Client Client Site [place] - [facility] Contact Type Call Who Is Calling Patient / Member / Family / Caregiver Call Type Triage / Clinical Relationship To Patient Self Return Phone Number [phone] (Primary) Chief Complaint Neck Injury Reason for Call Symptomatic / Request for [place] states she fell on the way to the bathroom when she began to feel dizziness and she hit her head. Caller states when she fell she could not move while she was on the floor and it felt like her weight went to her neck. Caller states she did hit her head when she fell. Translation No Nurse Assessment Nurse: [place], RN, Estill Bamberg Date/Time (Eastern Time): 01/09/2022 8:04:38 AM Confirm and document reason for call. If symptomatic, describe symptoms. ---caller states she fell this morning. she hit her head on a bookcase. Does the patient have any new or worsening symptoms? ---Yes Will a triage be completed? ---Yes Related visit to physician within the last 2 weeks? ---N/A Does the PT have any chronic conditions? (i.e. diabetes, asthma, this includes High risk factors for pregnancy, etc.) ---No Is this a behavioral health or substance abuse call? ---No Guidelines Guideline Title Affirmed Question Affirmed Notes Nurse Date/Time (Eastern Time) Head Injury Sounds like a serious injury to the triager [place], RN, Estill Bamberg 01/09/2022 8:05:28 AM Disp. Time Eilene Ghazi Time) Disposition Final User 01/09/2022 8:10:51 AM Go to ED Now Yes [place], RN, Estill Bamberg Final Disposition 01/09/2022 8:10:51 AM Go to ED Now Yes [place], RN,  Estill Bamberg PLEASE NOTE: All timestamps contained within this report are represented as [place] Standard Time. CONFIDENTIALTY NOTICE: This fax transmission is intended only for the addressee. It contains information that is legally privileged, confidential or otherwise protected from use or disclosure. If you are not the intended recipient, you are strictly prohibited from reviewing, disclosing, copying using or disseminating any of this information or taking any action in reliance on or regarding this information. If you have received this fax in error, please notify us immediately by telephone so that we can arrange for its return to [place]. Phone: [phone], Toll-Free: [phone], Fax: [phone] Page: 2 of 2 Call Id: [id number] [place] Disagree/Comply Comply Caller Understands Yes PreDisposition Did not know what to do Care Advice Given Per Guideline GO TO ED NOW: * You need to be seen in the Emergency Department. * Go to the ED at ___________ Hospital. * It is better and safer if another adult drives instead of you. ANOTHER ADULT SHOULD DRIVE: CARE ADVICE given per Head Injury (Adult) guideline. Referrals GO TO FACILITY UNDECIDED

## 2022-01-17 ENCOUNTER — Encounter: Payer: Self-pay | Admitting: Family Medicine

## 2022-01-17 ENCOUNTER — Ambulatory Visit: Payer: Medicare PPO | Admitting: Internal Medicine

## 2022-01-17 ENCOUNTER — Ambulatory Visit (HOSPITAL_BASED_OUTPATIENT_CLINIC_OR_DEPARTMENT_OTHER)
Admission: RE | Admit: 2022-01-17 | Discharge: 2022-01-17 | Disposition: A | Discharge status: Home / Self Care | Payer: Medicare PPO | Source: Ambulatory Visit | Attending: Family Medicine | Admitting: Family Medicine

## 2022-01-17 ENCOUNTER — Telehealth: Payer: Self-pay | Admitting: Family Medicine

## 2022-01-17 ENCOUNTER — Telehealth (HOSPITAL_BASED_OUTPATIENT_CLINIC_OR_DEPARTMENT_OTHER): Payer: Self-pay

## 2022-01-17 ENCOUNTER — Ambulatory Visit (INDEPENDENT_AMBULATORY_CARE_PROVIDER_SITE_OTHER): Payer: Medicare PPO | Admitting: Family Medicine

## 2022-01-17 ENCOUNTER — Ambulatory Visit (HOSPITAL_BASED_OUTPATIENT_CLINIC_OR_DEPARTMENT_OTHER): Payer: Medicare PPO

## 2022-01-17 ENCOUNTER — Other Ambulatory Visit (HOSPITAL_BASED_OUTPATIENT_CLINIC_OR_DEPARTMENT_OTHER): Payer: Medicare PPO

## 2022-01-17 ENCOUNTER — Ambulatory Visit (HOSPITAL_BASED_OUTPATIENT_CLINIC_OR_DEPARTMENT_OTHER): Admission: RE | Admit: 2022-01-17 | Payer: Medicare PPO | Source: Ambulatory Visit

## 2022-01-17 VITALS — BP 110/60 | HR 79 | Temp 97.7°F | Resp 18 | Ht 65.0 in | Wt 109.8 lb

## 2022-01-17 DIAGNOSIS — I700 Atherosclerosis of aorta: Secondary | ICD-10-CM | POA: Diagnosis not present

## 2022-01-17 DIAGNOSIS — W19XXXD Unspecified fall, subsequent encounter: Secondary | ICD-10-CM | POA: Diagnosis not present

## 2022-01-17 DIAGNOSIS — R7989 Other specified abnormal findings of blood chemistry: Secondary | ICD-10-CM | POA: Diagnosis not present

## 2022-01-17 DIAGNOSIS — S12501A Unspecified nondisplaced fracture of sixth cervical vertebra, initial encounter for closed fracture: Secondary | ICD-10-CM | POA: Diagnosis not present

## 2022-01-17 DIAGNOSIS — S0003XD Contusion of scalp, subsequent encounter: Secondary | ICD-10-CM | POA: Diagnosis not present

## 2022-01-17 DIAGNOSIS — R079 Chest pain, unspecified: Secondary | ICD-10-CM | POA: Diagnosis not present

## 2022-01-17 DIAGNOSIS — R0602 Shortness of breath: Secondary | ICD-10-CM | POA: Insufficient documentation

## 2022-01-17 DIAGNOSIS — M542 Cervicalgia: Secondary | ICD-10-CM | POA: Insufficient documentation

## 2022-01-17 DIAGNOSIS — X58XXXD Exposure to other specified factors, subsequent encounter: Secondary | ICD-10-CM | POA: Insufficient documentation

## 2022-01-17 DIAGNOSIS — R519 Headache, unspecified: Secondary | ICD-10-CM | POA: Insufficient documentation

## 2022-01-17 LAB — COMPREHENSIVE METABOLIC PANEL
ALT: 22 U/L (ref 0–35)
AST: 27 U/L (ref 0–37)
Albumin: 4.4 g/dL (ref 3.5–5.2)
Alkaline Phosphatase: 67 U/L (ref 39–117)
BUN: 13 mg/dL (ref 6–23)
CO2: 31 mEq/L (ref 19–32)
Calcium: 9.4 mg/dL (ref 8.4–10.5)
Chloride: 102 mEq/L (ref 96–112)
Creatinine, Ser: 0.93 mg/dL (ref 0.40–1.20)
GFR: 59.83 mL/min — ABNORMAL LOW (ref >60.00)
Glucose, Bld: 87 mg/dL (ref 70–99)
Potassium: 4.9 mEq/L (ref 3.5–5.1)
Sodium: 137 mEq/L (ref 135–145)
Total Bilirubin: 0.5 mg/dL (ref 0.2–1.2)
Total Protein: 7.4 g/dL (ref 6.0–8.3)

## 2022-01-17 LAB — TROPONIN I (HIGH SENSITIVITY): High Sens Troponin I: 4 ng/L (ref 2–17)

## 2022-01-17 LAB — CBC
HCT: 38.1 % (ref 36.0–46.0)
Hemoglobin: 12.6 g/dL (ref 12.0–15.0)
MCHC: 33.1 g/dL (ref 30.0–36.0)
MCV: 95.4 fl (ref 78.0–100.0)
Platelets: 245 10*3/uL (ref 150.0–400.0)
RBC: 3.99 Mil/uL (ref 3.87–5.11)
RDW: 13.6 % (ref 11.5–15.5)
WBC: 4.5 10*3/uL (ref 4.0–10.5)

## 2022-01-17 LAB — D-DIMER, QUANTITATIVE: D-Dimer, Quant: 1.34 mcg/mL FEU — ABNORMAL HIGH (ref <0.50)

## 2022-01-17 MED ORDER — IOHEXOL 350 MG/ML SOLN
100.0000 mL | Freq: Once | INTRAVENOUS | Status: AC | PRN
  Administered 2022-01-17: 100 mL via INTRAVENOUS

## 2022-01-17 NOTE — Progress Notes (Unsigned)
   Kelsey [name] D.[place] Phone: [phone]   Assessment and Plan:     There are no diagnoses linked to this encounter.  *** - Patient has received significant relief with OMT in the past.  Elects for repeat OMT today.  Tolerated well per note below. - Decision today to treat with OMT was based on Physical Exam   After verbal consent patient was treated with HVLA (high velocity low amplitude), ME (muscle energy), FPR (flex positional release), ST (soft tissue), PC/PD (Pelvic Compression/ Pelvic Decompression) techniques in cervical, rib, thoracic, lumbar, and pelvic areas. Patient tolerated the procedure well with improvement in symptoms.  Patient educated on potential side effects of soreness and recommended to rest, hydrate, and use Tylenol as needed for pain control.   Pertinent previous records reviewed include ***   Follow Up: ***     Subjective:   I, Kelsey [name], am serving as a Education administrator for Doctor Kelsey [name]  Chief Complaint: OMT  HPI:  12/21/2021 Kelsey [name] is a 76 y.o. female coming in with complaint of back and neck pain. OMT on 11/16/2021. Patient states that she is the same as last visit.  Patient did travel to [place].  Feels like she did relatively well with the traveling without any significant discomfort more than usual.  Was able to walk around there with no trouble.  Still not taking any pain medications   Medications patient has been prescribed: Prednisone  01/18/2022 Patient states   Relevant Historical Information: ***  Additional pertinent review of systems negative.  Current Outpatient Medications  Medication Sig Dispense Refill   atorvastatin (LIPITOR) 10 MG tablet Take 1 tablet (10 mg total) by mouth every evening. 90 tablet 3   omeprazole (PRILOSEC) 40 MG capsule Take 1 capsule (40 mg total) by mouth daily. 90 capsule 1   Vitamin D, Ergocalciferol, (DRISDOL) 1.25 MG (50000  UNIT) CAPS capsule Take 1 capsule (50,000 Units total) by mouth every 7 (seven) days. 12 capsule 3   No current facility-administered medications for this visit.      Objective:     There were no vitals filed for this visit.    There is no height or weight on file to calculate BMI.    Physical Exam:     General: Well-appearing, cooperative, sitting comfortably in no acute distress.   OMT Physical Exam:  ASIS Compression Test: Positive Right Cervical: TTP paraspinal, *** Rib: Bilateral elevated first rib with TTP Thoracic: TTP paraspinal,*** Lumbar: TTP paraspinal,*** Pelvis: Right anterior innominate  Electronically signed by:  Kelsey [name] D.[name] 2:32 PM 01/17/22

## 2022-01-17 NOTE — Patient Instructions (Signed)
It was nice to meet you, I am sorry you had this fall!    Please go to lab and then to the ground floor imaging dept to have your  -chest film -CT head -CT neck  I will also get labs to make sure no sign of heart muscle distress or a lung blood clot  I will be in touch with labs and results later today

## 2022-01-17 NOTE — Progress Notes (Addendum)
[facility] [address] [place], [place] [address] [phone]  Date:  01/17/2022   Name:  Kelsey [name]   DOB:  11-Feb-1946   MRN:  [id number]  PCP:  [name], DO    Chief Complaint: Fall (Pt fell at [facility] July 4 am. No LOC. She says she lost balance. She could not reach the phone, she was down about 30-40 mins. Then she was able to get up on her own. )   History of Present Illness:  Kelsey [name] is a 75 y.o. very pleasant female patient who presents with the following:  Primary pt of Dr [name] here today with concern of a recent fall I have not seen her myself in the past   Pt fell on 7/3/early morning of 7/4- she was seen on 7/5 by Dr Anitra Lauth: Patient works out in the extreme heat all day 2 days ago.  Then in the middle of the night that night she got up to urinate and felt dizzy.  She fell over and hit her head on a dresser.  No loss of consciousness but she remembers feeling a bit stunned for a minute.  She was able to eventually return to sleep and since then has felt some pain and stiffness in her neck that occasionally radiates over the top of the right shoulder no paresthesias or arm weakness.  She has no generalized headache but has some focal pain in the left frontal area where she hit her head.   No acute hearing or vision abnormalities.  No focal weakness, no tremor.  She has had no further dizziness. No fever, no nausea, no vomiting.  Neck films were done- negative  She hit her left head on a bookcase when she fell- there is still some bruising in this area The pain in her head is not getting better as she would have expected-however also not worse No vomiting She is eating well, able to do all her activities- She is not feeing quite as strong as usual since she fell Her neck hurts if she tries to flex her neck.  She also notes some intermittent tingling in her left hand and arm since she fell.   No weakness however  Pt has noted feeling more SOB with exercise the last few days- typically she can walk long distances fast and this is a bit harder than normal  No CP. She is not sure if this is related to the fall No history of heart problems except she notes history of cardiomegaly  She is generally in good health, is very active for her age   Patient Active Problem List   Diagnosis Date Noted   Primary osteoarthritis of both hands 05/08/2021   Trigger finger, acquired 01/25/2021   Female bladder prolapse 12/26/2018   Degenerative lumbar spinal stenosis 08/19/2018   CMC arthritis 03/13/2018   De Quervain's tenosynovitis, left 12/11/2017   Nonallopathic lesion of lumbosacral region 01/31/2017   Nonallopathic lesion of sacral region 01/03/2017   Nonallopathic lesion of thoracic region 01/03/2017   Cervical somatic dysfunction 12/07/2016   Pelvic somatic dysfunction 12/07/2016   SI (sacroiliac) joint dysfunction 12/07/2016   Abnormal mini-mental status exam 07/05/2016   Leg length discrepancy 01/25/2014   History of iron deficiency anemia 12/02/2013   Vitamin D deficiency 12/03/2012   Fibrocystic breast 09/14/2011   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   GERD (gastroesophageal reflux disease)    Osteoporosis  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   Bladder prolapse, female, acquired    Chicken pox as a child   Dysuria 09/13/2011   Female bladder prolapse 09/13/2011   Fibrocystic breast 09/14/2011   GERD (gastroesophageal reflux disease)    H/O measles    H/O mumps    Hyperlipidemia    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   Osteoarthrosis    right shoulder   Osteoporosis    reclast in Oct   Ovarian cyst 09/14/2011   Renal cyst left   3 cm   [facility] spotted fever) 2017   Situational anxiety 03/19/2014   R/t grieving process & caregiver burden for husband who has dementia, alzheimer's.    Uterine  fibroid 11/2012   See Dr. Cletis Media note, surgery 11/2012; calcified- multiple   Vaginitis 09/13/2011    Past Surgical History:  Procedure Laterality Date   DILATATION & CURRETTAGE/HYSTEROSCOPY WITH RESECTOCOPE N/A 11/06/2012   Procedure: [facility];  Surgeon: Alwyn Pea, MD;  Location: [facility];  Service: Gynecology;  Laterality: N/A;   HERNIA REPAIR  1992   I & D EXTREMITY Left 03/08/2021   Procedure: IRRIGATION AND DEBRIDEMENT OF THUMB AND ARM;  Surgeon: Leanora Cover, MD;  Location: [facility];  Service: Orthopedics;  Laterality: Left;   small intestine hernia  1994   WISDOM TOOTH EXTRACTION      Social History   Tobacco Use   Smoking status: Former    Packs/day: 0.30    Years: 10.00    Total pack years: 3.00    Types: Cigarettes    Quit date: 07/09/1978    Years since quitting: 43.5   Smokeless tobacco: Never  Vaping Use   Vaping Use: Never used  Substance Use Topics   Alcohol use: Yes    Comment: 1-2 glasses of wine daily and a 12 oz beer daily   Drug use: No    Family History  Problem Relation Age of Onset   Heart disease Mother    Osteoporosis Mother    Cancer Father        prostate   Osteoporosis Sister    Gout Brother    Gout Maternal Grandmother    Heart disease Maternal Grandmother    Alzheimer's disease Maternal Grandfather    Osteoporosis Sister     Allergies  Allergen Reactions   Penicillins Rash    Has patient had a PCN reaction causing immediate rash, facial/tongue/throat swelling, SOB or lightheadedness with hypotension: no Has patient had a PCN reaction causing severe rash involving mucus membranes or skin necrosis: yes Has patient had a PCN reaction that required hospitalization: no Has patient had a PCN reaction occurring within the last 10 years: no If all of the above answers are "NO", then may proceed with Cephalosporin use.    Sulfa Antibiotics Rash    Under arm and thighs    Medication list has been  reviewed and updated.  Current Outpatient Medications on File Prior to Visit  Medication Sig Dispense Refill   atorvastatin (LIPITOR) 10 MG tablet Take 1 tablet (10 mg total) by mouth every evening. 90 tablet 3   omeprazole (PRILOSEC) 40 MG capsule Take 1 capsule (40 mg total) by mouth daily. 90 capsule 1   Vitamin D, Ergocalciferol, (DRISDOL) 1.25 MG (50000 UNIT) CAPS capsule Take 1 capsule (50,000 Units total) by mouth every 7 (seven) days. 12  capsule 3   No current facility-administered medications on file prior to visit.    Review of Systems:  As per HPI- otherwise negative.   Physical Examination: Vitals:   01/17/22 1024  BP: 110/60  Pulse: 79  Resp: 18  Temp: 97.7 F (36.5 C)  SpO2: 94%   Vitals:   01/17/22 1024  Weight: 109 lb 12.8 oz (49.8 kg)  Height: '5\' 5"'$  (1.651 m)   Body mass index is 18.27 kg/m. Ideal Body Weight: Weight in (lb) to have BMI = 25: 149.9  GEN: no acute distress.  Slender build, looks well and vigorous for age 54: Atraumatic, Normocephalic.  Bilateral TM wnl, oropharynx normal.  PEERL,EOMI.   Ears and Nose: No external deformity. CV: RRR, No M/G/R. No JVD. No thrill. No extra heart sounds. PULM: CTA B, no wheezes, crackles, rhonchi. No retractions. No resp. distress. No accessory muscle use. ABD: S, NT, ND. No rebound. No HSM. EXTR: No c/c/e PSYCH: Normally interactive. Conversant.  Normal strength of all extremities. There is a resolving bruise over her left eye No bony cervical spine tenderness, able to rotate neck laterally to 45 each direction with no pain  Patient notes pain with flexion and extension- avoid these motions  EKG: SR, normal Compared with tracing from 2015 change in V2, but this may be placement   Wt Readings from Last 3 Encounters:  01/17/22 109 lb 12.8 oz (49.8 kg)  01/10/22 110 lb 3.2 oz (50 kg)  12/21/21 109 lb (49.4 kg)     Assessment and Plan: Fall, subsequent encounter - Plan: CBC, Comprehensive  metabolic panel  Contusion of scalp, subsequent encounter - Plan: CBC, Comprehensive metabolic panel, CT HEAD WO CONTRAST (5MM)  Neck pain - Plan: CT CERVICAL SPINE WO CONTRAST  SOB (shortness of breath) - Plan: EKG 12-Lead, Troponin I (High Sensitivity), D-Dimer, Quantitative, DG Chest 2 View  Patient seen today after a fall which occurred about 1 week ago.  She hit her head against a bookshelf, also seems to have injured her neck.  So far she had plain cervical spine films which were negative  Today she notes continued discomfort in the area of contusion on her left scalp.  She denies taking any blood thinners.  She also continues to have some neck discomfort as well as tingling in the left hand.  In addition, she has felt decreased exercise tolerance the last few days although no chest pain  EKG today is reassuring, minor changes from previous likely due to lead placement Plan for stat CT head and neck, chest film We will also obtain lab work as above, check troponin and D-dimer I will call her with results later today Assuming CT of her neck looks good, an MRI may be needed if numbness continues Signed Lamar Blinks, MD  Received chest film as below, await further imaging  Received labs- called pt and left detailed message, could not reach after 2 calls + D dimer Will also order CT angio; hopefully can also be done when she comes in this evening for her CT scans   Results for orders placed or performed in visit on 01/17/22  CBC  Result Value Ref Range   WBC 4.5 4.0 - 10.5 K/uL   RBC 3.99 3.87 - 5.11 Mil/uL   Platelets 245.0 150.0 - 400.0 K/uL   Hemoglobin 12.6 12.0 - 15.0 g/dL   HCT 38.1 36.0 - 46.0 %   MCV 95.4 78.0 - 100.0 fl   MCHC 33.1 30.0 -  36.0 g/dL   RDW 13.6 11.5 - 15.5 %  Comprehensive metabolic panel  Result Value Ref Range   Sodium 137 135 - 145 mEq/L   Potassium 4.9 3.5 - 5.1 mEq/L   Chloride 102 96 - 112 mEq/L   CO2 31 19 - 32 mEq/L   Glucose, Bld 87 70  - 99 mg/dL   BUN 13 6 - 23 mg/dL   Creatinine, Ser 0.93 0.40 - 1.20 mg/dL   Total Bilirubin 0.5 0.2 - 1.2 mg/dL   Alkaline Phosphatase 67 39 - 117 U/L   AST 27 0 - 37 U/L   ALT 22 0 - 35 U/L   Total Protein 7.4 6.0 - 8.3 g/dL   Albumin 4.4 3.5 - 5.2 g/dL   GFR 59.83 (L) >60.00 mL/min   Calcium 9.4 8.4 - 10.5 mg/dL  D-Dimer, Quantitative  Result Value Ref Range   D-Dimer, Quant 1.34 (H) <0.50 mcg/mL FEU  Troponin I (High Sensitivity)  Result Value Ref Range   High Sens Troponin I 4 2 - 17 ng/L   Received imaging reports for patient, gave her a call CT HEAD WO CONTRAST (5MM)  Result Date: 01/17/2022 CLINICAL DATA:  Fall on 07/04, headaches. Neck pain, left arm numbness. EXAM: CT HEAD WITHOUT CONTRAST CT CERVICAL SPINE WITHOUT CONTRAST TECHNIQUE: Multidetector CT imaging of the head and cervical spine was performed following the standard protocol without intravenous contrast. Multiplanar CT image reconstructions of the cervical spine were also generated. RADIATION DOSE REDUCTION: This exam was performed according to the departmental dose-optimization program which includes automated exposure control, adjustment of the mA and/or kV according to patient size and/or use of iterative reconstruction technique. COMPARISON:  None Available. FINDINGS: CT HEAD FINDINGS Brain: No evidence of acute infarction, hemorrhage, hydrocephalus, extra-axial collection or mass lesion/mass effect. Vascular: No hyperdense vessel or unexpected calcification. Skull: Normal. Negative for fracture or focal lesion. Sinuses/Orbits: The visualized paranasal sinuses are essentially clear. The mastoid air cells are unopacified. Other: None. CT CERVICAL SPINE FINDINGS Alignment: Straightening of the cervical spine, likely positional. Skull base and vertebrae: Nondisplaced fracture involving the right lateral process at C6 (series 3/image 62). Vertebral body heights are maintained. Soft tissues and spinal canal: No prevertebral  fluid or swelling. No visible canal hematoma. Disc levels: Intervertebral disc spaces are maintained. Spinal canal is patent. Upper chest: Evaluated on dedicated CT chest. Other: None. IMPRESSION: Nondisplaced fracture involving the right lateral process at C6. Normal head CT. Electronically Signed   By: Julian Hy M.D.   On: 01/17/2022 19:14   CT CERVICAL SPINE WO CONTRAST  Result Date: 01/17/2022 CLINICAL DATA:  Fall on 07/04, headaches. Neck pain, left arm numbness. EXAM: CT HEAD WITHOUT CONTRAST CT CERVICAL SPINE WITHOUT CONTRAST TECHNIQUE: Multidetector CT imaging of the head and cervical spine was performed following the standard protocol without intravenous contrast. Multiplanar CT image reconstructions of the cervical spine were also generated. RADIATION DOSE REDUCTION: This exam was performed according to the departmental dose-optimization program which includes automated exposure control, adjustment of the mA and/or kV according to patient size and/or use of iterative reconstruction technique. COMPARISON:  None Available. FINDINGS: CT HEAD FINDINGS Brain: No evidence of acute infarction, hemorrhage, hydrocephalus, extra-axial collection or mass lesion/mass effect. Vascular: No hyperdense vessel or unexpected calcification. Skull: Normal. Negative for fracture or focal lesion. Sinuses/Orbits: The visualized paranasal sinuses are essentially clear. The mastoid air cells are unopacified. Other: None. CT CERVICAL SPINE FINDINGS Alignment: Straightening of the cervical spine, likely positional. Skull base  and vertebrae: Nondisplaced fracture involving the right lateral process at C6 (series 3/image 62). Vertebral body heights are maintained. Soft tissues and spinal canal: No prevertebral fluid or swelling. No visible canal hematoma. Disc levels: Intervertebral disc spaces are maintained. Spinal canal is patent. Upper chest: Evaluated on dedicated CT chest. Other: None. IMPRESSION: Nondisplaced  fracture involving the right lateral process at C6. Normal head CT. Electronically Signed   By: Julian Hy M.D.   On: 01/17/2022 19:14   CT Angio Chest W/Cm &/Or Wo Cm  Result Date: 01/17/2022 CLINICAL DATA:  Chest pain. Positive D-dimer. High probability for pulmonary embolism. EXAM: CT ANGIOGRAPHY CHEST WITH CONTRAST TECHNIQUE: Multidetector CT imaging of the chest was performed using the standard protocol during bolus administration of intravenous contrast. Multiplanar CT image reconstructions and MIPs were obtained to evaluate the vascular anatomy. RADIATION DOSE REDUCTION: This exam was performed according to the departmental dose-optimization program which includes automated exposure control, adjustment of the mA and/or kV according to patient size and/or use of iterative reconstruction technique. CONTRAST:  159m OMNIPAQUE IOHEXOL 350 MG/ML SOLN COMPARISON:  None Available. FINDINGS: Cardiovascular: Satisfactory opacification of pulmonary arteries noted, and no pulmonary emboli identified. No evidence of thoracic aortic dissection or aneurysm. Aortic and coronary atherosclerotic calcification incidentally noted. Mediastinum/Nodes: No masses or pathologically enlarged lymph nodes identified. Lungs/Pleura: No pulmonary mass, infiltrate, or effusion. Upper abdomen: No acute findings. Musculoskeletal: No suspicious bone lesions identified. Review of the MIP images confirms the above findings. IMPRESSION: No evidence of pulmonary embolism or other active disease. Aortic Atherosclerosis (ICD10-I70.0). Electronically Signed   By: JMarlaine HindM.D.   On: 01/17/2022 19:04   DG Chest 2 View  Result Date: 01/17/2022 CLINICAL DATA:  Shortness of breath for 4 days. EXAM: CHEST - 2 VIEW COMPARISON:  November 04, 2019 FINDINGS: Calcific atherosclerotic disease and tortuosity of the aorta. Cardiomediastinal silhouette is normal. Mediastinal contours appear intact. There is no evidence of focal airspace  consolidation, pleural effusion or pneumothorax. Biapical subpleural scarring. Osseous structures are without acute abnormality. Soft tissues are grossly normal. IMPRESSION: 1. No active cardiopulmonary disease. 2. Calcific atherosclerotic disease and tortuosity of the aorta. Electronically Signed   By: DFidela SalisburyM.D.   On: 01/17/2022 11:51    Called and spoke with MArnetha Massy nurse practitioner on-call for neurosurgery.  Fracture is nonemergent, patient needs to be fitted with a hard collar but this can be done tomorrow as her injury is 8 days out and we do not have immediate access to a hard collar.  [facility] in [facility] and had them bring patient in for collar fitting; will order neurosurgery consultation and MRI cervical spine  Called patient to pass along these updates-answered all questions.  Asked her to take it easy until she gets into her collar, certainly minimize neck motion CT head negative, no PE She states understanding and agreement   I spent 60 minutes today on patient exam, evaluation, and coordination of care

## 2022-01-17 NOTE — Addendum Note (Signed)
Addended by: Lamar Blinks C on: 01/17/2022 01:45 PM   Modules accepted: Orders

## 2022-01-17 NOTE — Telephone Encounter (Signed)
[facility] nurse called stating that patient had a fall 1 week ago and needed to be seen by in next 3-4 hours. Patient was scheduled with [facility] which had the next soonest appointment at 9:40 am. Patient stated she felt more sob than usual.

## 2022-01-17 NOTE — Addendum Note (Signed)
Addended by: Lamar Blinks C on: 01/17/2022 08:24 PM   Modules accepted: Orders, Level of Service

## 2022-01-18 ENCOUNTER — Ambulatory Visit (INDEPENDENT_AMBULATORY_CARE_PROVIDER_SITE_OTHER): Payer: Medicare PPO | Admitting: Sports Medicine

## 2022-01-18 VITALS — BP 106/80 | HR 86 | Ht 65.0 in | Wt 107.0 lb

## 2022-01-18 DIAGNOSIS — S129XXA Fracture of neck, unspecified, initial encounter: Secondary | ICD-10-CM | POA: Diagnosis not present

## 2022-01-18 DIAGNOSIS — M542 Cervicalgia: Secondary | ICD-10-CM | POA: Diagnosis not present

## 2022-01-18 NOTE — Patient Instructions (Addendum)
Good to see you Neck HEP 4 week follow up

## 2022-01-19 ENCOUNTER — Telehealth: Payer: Self-pay | Admitting: Sports Medicine

## 2022-01-19 ENCOUNTER — Telehealth: Payer: Self-pay | Admitting: Family Medicine

## 2022-01-19 NOTE — Telephone Encounter (Signed)
Called to check on patient, she notes she is doing fine.  Her MRI is scheduled for Monday.  She wonders if she should do neck exercises, I advised her to hold off on this until we get her MRI results back

## 2022-01-19 NOTE — Telephone Encounter (Signed)
Patient called stating that she was seen by Dr Glennon Mac on 01/18/2022 and was told to refrain from swimming. She asked if there were any other activities that she should not do as well?  Please advise.

## 2022-01-22 ENCOUNTER — Ambulatory Visit (HOSPITAL_COMMUNITY)
Admission: RE | Admit: 2022-01-22 | Discharge: 2022-01-22 | Disposition: A | Discharge status: Home / Self Care | Payer: Medicare PPO | Source: Ambulatory Visit | Attending: Family Medicine | Admitting: Family Medicine

## 2022-01-22 DIAGNOSIS — S12501A Unspecified nondisplaced fracture of sixth cervical vertebra, initial encounter for closed fracture: Secondary | ICD-10-CM | POA: Insufficient documentation

## 2022-01-22 DIAGNOSIS — M4802 Spinal stenosis, cervical region: Secondary | ICD-10-CM | POA: Diagnosis not present

## 2022-01-22 DIAGNOSIS — R270 Ataxia, unspecified: Secondary | ICD-10-CM | POA: Diagnosis not present

## 2022-01-22 DIAGNOSIS — M50223 Other cervical disc displacement at C6-C7 level: Secondary | ICD-10-CM | POA: Diagnosis not present

## 2022-01-22 DIAGNOSIS — S12500A Unspecified displaced fracture of sixth cervical vertebra, initial encounter for closed fracture: Secondary | ICD-10-CM | POA: Diagnosis not present

## 2022-01-22 NOTE — Telephone Encounter (Signed)
Pt was called and left a message of dr. Eduard Roux recommendations

## 2022-01-24 ENCOUNTER — Encounter: Payer: Self-pay | Admitting: Family Medicine

## 2022-01-24 NOTE — Telephone Encounter (Signed)
Called [facility] and asked them to go ahead and get pt seen, request an urgent referral as she does have sx down her EFT arm since she fell

## 2022-02-06 ENCOUNTER — Other Ambulatory Visit: Payer: Self-pay

## 2022-02-06 MED ORDER — OMEPRAZOLE 40 MG PO CPDR: 40.0000 mg | DELAYED_RELEASE_CAPSULE | Freq: Every day | ORAL | 0 refills | Status: DC

## 2022-02-08 DIAGNOSIS — S12591D Other nondisplaced fracture of sixth cervical vertebra, subsequent encounter for fracture with routine healing: Secondary | ICD-10-CM | POA: Diagnosis not present

## 2022-02-08 DIAGNOSIS — M542 Cervicalgia: Secondary | ICD-10-CM | POA: Diagnosis not present

## 2022-02-08 DIAGNOSIS — Z681 Body mass index (BMI) 19 or less, adult: Secondary | ICD-10-CM | POA: Diagnosis not present

## 2022-02-14 NOTE — Progress Notes (Unsigned)
[facility] [place] Phone: [phone] Subjective:   [name], am serving as a scribe for Dr. [name].  I'm seeing this patient by the request  of:  [name], [name], DO  CC: Neck pain, back pain follow-up  [id number]  01/18/2022 1. Closed fracture of transverse process of cervical vertebra, initial encounter ([place]) 2. Neck pain -Acute, complicated, initial sports medicine visit - Patient had a fall on 01/09/2022 and has newly diagnosed C6 right transverse process fracture that does not extend into transverse foramen. - Recommended that patient stop swimming activities for the next 4 weeks or until reevaluated to allow for proper healing. 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  Reassuring that the fracture did not extend into transverse foramen based on CT - Continue with cervical spine MRI -OMT not performed today due to new transverse process cervical fracture   Pertinent previous records reviewed include CT neck 01/17/2022   Follow Up: 4 weeks for reevaluation and can progress physical activity if improving  Updated 02/15/2022 Amarah ROTUNDA WORDEN is a 76 y.o. female coming in with complaint of neck f/x. Patient fell in middle of the night when she awoke and did not have feeling in feet. Stated that it felt like the blood had not circulated to her feet. She said she was out working in the yard the previous day and walked about 3 miles in 93 degree heat. Wondered if she had heat stroke that could have caused the symptoms that led to her fall. Patient hit her head on nightstand. Painful to tilt head to right side. Also has tingling on dorsal surface of R hand and  arm. Also notes in L hand the pinky will be cold and weak. Unable to hold a bowl to eat.   Also having coccyx pain that began after her fall. Pain will radiate into the R glute.   MRI IMPRESSION: 1. Known acute nondisplaced right lateral mass fracture of C6. 2. No evidence of spinal cord injury or frank ligamentous disruption. 3. Mild spinal stenosis from C3-4 to C6-7. 4. Severe right and moderate left neural foraminal stenosis at C5-6. 5. Moderate neural foraminal stenosis on the left at C4-5 and on the right at C6-7.     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    Uterine fibroid 11/2012   See Dr. Cletis Media note, surgery 11/2012; calcified- multiple   Vaginitis 09/13/2011   Past Surgical History:  Procedure Laterality Date   DILATATION &  CURRETTAGE/HYSTEROSCOPY WITH RESECTOCOPE N/A 11/06/2012   Procedure: DILATATION & CURETTAGE/HYSTEROSCOPY WITH RESECTOCOPE;  Surgeon: Alwyn Pea, MD;  Location: [facility];  Service: Gynecology;  Laterality: N/A;   HERNIA REPAIR  1992   I & D EXTREMITY Left 03/08/2021   Procedure: IRRIGATION AND DEBRIDEMENT OF THUMB AND ARM;  Surgeon: Leanora Cover, MD;  Location: [facility];  Service: Orthopedics;  Laterality: Left;   small intestine hernia  1994   WISDOM TOOTH EXTRACTION     Social History   Socioeconomic History   Marital status: Widowed     Spouse name: Not on file   Number of children: Not on file   Years of education: Not on file   Highest education level: Not on file  Occupational History   Not on file  Tobacco Use   Smoking status: Former    Packs/day: 0.30    Years: 10.00    Total pack years: 3.00    Types: Cigarettes    Quit date: 07/09/1978    Years since quitting: 43.6   Smokeless tobacco: Never  Vaping Use   Vaping Use: Never used  Substance and Sexual Activity   Alcohol use: Yes    Comment: 1-2 glasses of wine daily and a 12 oz beer daily   Drug use: No   Sexual activity: Never  Other Topics Concern   Not on file  Social History Narrative   Right handed    Lives with husband    Social Determinants of Health   Financial Resource Strain: Low Risk  (09/06/2021)   Overall Financial Resource Strain (CARDIA)    Difficulty of Paying Living Expenses: Not hard at all  Food Insecurity: No Food Insecurity (09/06/2021)   Hunger Vital Sign    Worried About Running Out of Food in the Last Year: Never true    Ran Out of Food in the Last Year: Never true  Transportation Needs: No Transportation Needs (09/06/2021)   PRAPARE - Hydrologist (Medical): No    Lack of Transportation (Non-Medical): No  Physical Activity: Sufficiently Active (09/06/2021)   Exercise Vital Sign    Days of Exercise per Week: 5 days    Minutes of Exercise per Session: 60 min  Stress: No Stress Concern Present (09/06/2021)   [place]    Feeling of Stress : Not at all  Social Connections: Socially Isolated (09/06/2021)   Social Connection and Isolation Panel [NHANES]    Frequency of Communication with Friends and Family: More than three times a week    Frequency of Social Gatherings with Friends and Family: More than three times a week    Attends Religious Services: Never    Marine scientist or Organizations: No    Attends Archivist  Meetings: Never    Marital Status: Widowed   Allergies  Allergen Reactions   Penicillins Rash    Has patient had a PCN reaction causing immediate rash, facial/tongue/throat swelling, SOB or lightheadedness with hypotension: no Has patient had a PCN reaction causing severe rash involving mucus membranes or skin necrosis: yes Has patient had a PCN reaction that required hospitalization: no Has patient had a PCN reaction occurring within the last 10 years: no If all of the above answers are "NO", then may proceed with Cephalosporin use.    Sulfa Antibiotics Rash    Under arm and thighs   Family History  Problem Relation Age of Onset  Heart disease Mother    Osteoporosis Mother    Cancer Father        prostate   Osteoporosis Sister    Gout Brother    Gout Maternal Grandmother    Heart disease Maternal Grandmother    Alzheimer's disease Maternal Grandfather    Osteoporosis Sister      Current Outpatient Medications (Cardiovascular):    atorvastatin (LIPITOR) 10 MG tablet, Take 1 tablet (10 mg total) by mouth every evening.     Current Outpatient Medications (Other):    omeprazole (PRILOSEC) 40 MG capsule, Take 1 capsule (40 mg total) by mouth daily.   Vitamin D, Ergocalciferol, (DRISDOL) 1.25 MG (50000 UNIT) CAPS capsule, Take 1 capsule (50,000 Units total) by mouth every 7 (seven) days.   Reviewed prior external information including notes and imaging from  primary care provider As well as notes that were available from care everywhere and other healthcare systems.  Past medical history, social, surgical and family history all reviewed in electronic medical record.  No pertanent information unless stated regarding to the chief complaint.   Review of Systems:  No headache, visual changes, nausea, vomiting, diarrhea, constipation, dizziness, abdominal pain, skin rash, fevers, chills, night sweats, weight loss, swollen lymph nodes, body aches, joint swelling, chest pain,  shortness of breath, mood changes. POSITIVE muscle aches  Objective  Blood pressure 98/64, pulse 76, height '5\' 5"'$  (1.651 m), weight 111 lb (50.3 kg), SpO2 97 %.   General: No apparent distress alert and oriented x3 mood and affect normal, dressed appropriately.  HEENT: Pupils equal, extraocular movements intact  Respiratory: Patient's speak in full sentences and does not appear short of breath  Cardiovascular: No lower extremity edema, non tender, no erythema  Patient is symptomatic.  Deferred any significant strength testing.  Patient did have still very limited sidebending of less than 5 degrees bilaterally.  Some difficulty with flexion and extension as well.  Tightness noted in the paraspinal musculature of the lumbar spine.  Does have difficulty with FABER test bilaterally as well.  Osteopathic findings  T9 extended rotated and side bent left L2 flexed rotated and side bent right Sacrum right on right     Impression and Recommendations:    The above documentation has been reviewed and is accurate and complete Lyndal Pulley, DO

## 2022-02-15 ENCOUNTER — Encounter: Payer: Self-pay | Admitting: Family Medicine

## 2022-02-15 ENCOUNTER — Ambulatory Visit (INDEPENDENT_AMBULATORY_CARE_PROVIDER_SITE_OTHER): Payer: Medicare PPO | Admitting: Family Medicine

## 2022-02-15 VITALS — BP 98/64 | HR 76 | Ht 65.0 in | Wt 111.0 lb

## 2022-02-15 DIAGNOSIS — S129XXA Fracture of neck, unspecified, initial encounter: Secondary | ICD-10-CM | POA: Diagnosis not present

## 2022-02-15 DIAGNOSIS — M9904 Segmental and somatic dysfunction of sacral region: Secondary | ICD-10-CM

## 2022-02-15 DIAGNOSIS — M999 Biomechanical lesion, unspecified: Secondary | ICD-10-CM

## 2022-02-15 DIAGNOSIS — M48061 Spinal stenosis, lumbar region without neurogenic claudication: Secondary | ICD-10-CM | POA: Diagnosis not present

## 2022-02-15 DIAGNOSIS — M9903 Segmental and somatic dysfunction of lumbar region: Secondary | ICD-10-CM

## 2022-02-15 DIAGNOSIS — M542 Cervicalgia: Secondary | ICD-10-CM | POA: Diagnosis not present

## 2022-02-15 DIAGNOSIS — M9902 Segmental and somatic dysfunction of thoracic region: Secondary | ICD-10-CM | POA: Diagnosis not present

## 2022-02-15 HISTORY — DX: Fracture of neck, unspecified, initial encounter: S12.9XXA

## 2022-02-15 NOTE — Assessment & Plan Note (Signed)
Nondisplaced, ROM will be given in 4 weeks, ok to walk or walk in pool only.

## 2022-02-15 NOTE — Assessment & Plan Note (Signed)
Decision today to treat with OMT was based on Physical Exam  After verbal consent patient was treated with ME, FPR techniques in thoracic, lumbar and sacral  areas  Patient tolerated the procedure well with improvement in symptoms  Patient given exercises, stretches and lifestyle modifications  See medications in patient instructions if given  Patient will follow up in 4 weeks

## 2022-02-15 NOTE — Patient Instructions (Addendum)
Ok to walk including in a pool Keep working on small range of motion See me again 4-6 weeks

## 2022-02-15 NOTE — Assessment & Plan Note (Signed)
Responding well to manipulation at this time.  Discussed vitamin D at the moment.  Patient is scheduled to have another x-ray in September.  At that point we will recheck and make sure that we do have a callus formation noted.  Patient should do relatively well though with conservative therapy.

## 2022-02-20 ENCOUNTER — Ambulatory Visit (INDEPENDENT_AMBULATORY_CARE_PROVIDER_SITE_OTHER): Payer: Medicare PPO | Admitting: Family Medicine

## 2022-02-20 ENCOUNTER — Encounter: Payer: Self-pay | Admitting: Family Medicine

## 2022-02-20 VITALS — BP 102/61 | HR 77 | Temp 98.2°F | Ht 65.0 in | Wt 110.0 lb

## 2022-02-20 DIAGNOSIS — J3489 Other specified disorders of nose and nasal sinuses: Secondary | ICD-10-CM | POA: Diagnosis not present

## 2022-02-20 MED ORDER — MUPIROCIN 2 % EX OINT: 1.0000 | TOPICAL_OINTMENT | Freq: Three times a day (TID) | CUTANEOUS | 0 refills | Status: DC

## 2022-02-20 MED ORDER — DOXYCYCLINE HYCLATE 100 MG PO TABS: 100.0000 mg | ORAL_TABLET | Freq: Two times a day (BID) | ORAL | 0 refills | Status: AC

## 2022-02-20 NOTE — Patient Instructions (Signed)
Use nasal saline to cleanse and flush area twice a day.  Place new ointment on area twice a day.  Complete course of doxycyline every 12 hours 10 days.

## 2022-02-20 NOTE — Progress Notes (Signed)
Kelsey [name] , 18-Mar-1946, 76 y.o., female MRN: [id number] Patient Care Team    Relationship Specialty Notifications Start End  [name], DO PCP - General Family Medicine  05/27/15   [name], MD Consulting Physician Gastroenterology  12/08/15   Calvert Cantor, MD Consulting Physician Ophthalmology  12/08/15   Lyndal Pulley, DO Consulting Physician Family Medicine  04/07/18   Cameron Sprang, MD Consulting Physician Neurology  01/23/21     Chief Complaint  Patient presents with   Facial Swelling    Pt c/o nose swelling with redness x 2 days     Subjective: Pt presents for an OV with complaints of nose redness and pain of 4 days duration. She reports over the weekend it became rather swollen and red. She denies fever or chills. She had "leftover" keflex and started taking it. She is cleaning with salt water and placing neosporin ointment over area of sore inside the left nare.      10/17/2021    8:46 AM 09/06/2021   11:59 AM 05/08/2021    9:36 AM 08/19/2020   10:28 AM 08/17/2020    9:50 AM  Depression screen PHQ 2/9  Decreased Interest 0 0 0 0 0  Down, Depressed, Hopeless 0 0 0 1 0  PHQ - 2 Score 0 0 0 1 0  Altered sleeping    0   Tired, decreased energy    3   Change in appetite    0   Feeling bad or failure about yourself     1   Trouble concentrating    2   Moving slowly or fidgety/restless    0   Suicidal thoughts    0   PHQ-9 Score    7     Allergies  Allergen Reactions   Penicillins Rash    Has patient had a PCN reaction causing immediate rash, facial/tongue/throat swelling, SOB or lightheadedness with hypotension: no Has patient had a PCN reaction causing severe rash involving mucus membranes or skin necrosis: yes Has patient had a PCN reaction that required hospitalization: no Has patient had a PCN reaction occurring within the last 10 years: no If all of the above answers are "NO", then may proceed with Cephalosporin use.    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    Uterine fibroid 11/2012   See Dr. Cletis Media note, surgery 11/2012; calcified- multiple   Vaginitis 09/13/2011   Past Surgical History:  Procedure Laterality Date   DILATATION & CURRETTAGE/HYSTEROSCOPY WITH RESECTOCOPE N/A 11/06/2012  Procedure: DILATATION & CURETTAGE/HYSTEROSCOPY WITH RESECTOCOPE;  Surgeon: Alwyn Pea, MD;  Location: [facility];  Service: Gynecology;  Laterality: N/A;   HERNIA REPAIR  1992   I & D EXTREMITY Left 03/08/2021   Procedure: IRRIGATION AND DEBRIDEMENT OF THUMB AND ARM;  Surgeon: Leanora Cover, MD;  Location: [facility];  Service: Orthopedics;  Laterality: Left;   small intestine hernia  1994   WISDOM TOOTH EXTRACTION     Family History  Problem Relation Age of Onset   Heart disease Mother    Osteoporosis Mother    Cancer Father        prostate   Osteoporosis Sister    Gout Brother    Gout Maternal Grandmother    Heart disease Maternal Grandmother    Alzheimer's disease Maternal Grandfather    Osteoporosis Sister    Allergies as of 02/20/2022       Reactions   Penicillins Rash   Has patient had a PCN reaction causing immediate rash, facial/tongue/throat swelling, SOB or lightheadedness with  hypotension: no Has patient had a PCN reaction causing severe rash involving mucus membranes or skin necrosis: yes Has patient had a PCN reaction that required hospitalization: no Has patient had a PCN reaction occurring within the last 10 years: no If all of the above answers are "NO", then may proceed with Cephalosporin use.   Sulfa Antibiotics Rash   Under arm and thighs        Medication List        Accurate as of February 20, 2022  4:30 PM. If you have any questions, ask your nurse or doctor.          atorvastatin 10 MG tablet Commonly known as: LIPITOR Take 1 tablet (10 mg total) by mouth every evening.   doxycycline 100 MG tablet Commonly known as: VIBRA-TABS Take 1 tablet (100 mg total) by mouth 2 (two) times daily for 10 days. Started by: Howard Pouch, DO   mupirocin ointment 2 % Commonly known as: BACTROBAN Apply 1 Application topically 3 (three) times daily. Started by: Howard Pouch, DO   omeprazole 40 MG capsule Commonly known as: PRILOSEC Take 1 capsule (40 mg total) by mouth daily.   Vitamin D (Ergocalciferol) 1.25 MG (50000 UNIT) Caps capsule Commonly known as: DRISDOL Take 1 capsule (50,000 Units total) by mouth every 7 (seven) days.        All past medical history, surgical history, allergies, family history, immunizations andmedications were updated in the EMR today and reviewed under the history and medication portions of their EMR.     ROS Negative, with the exception of above mentioned in HPI  Objective:  BP 102/61   Pulse 77   Temp 98.2 F (36.8 C) (Oral)   Ht '5\' 5"'$  (1.651 m)   Wt 110 lb (49.9 kg)   SpO2 97%   BMI 18.30 kg/m  Body mass index is 18.3 kg/m. Physical Exam Vitals and nursing note reviewed.  Constitutional:      General: She is not in acute distress.    Appearance: Normal appearance. She is normal weight. She is not ill-appearing or toxic-appearing.  HENT:     Head: Normocephalic.     Nose: Nasal tenderness  present.   Eyes:     Extraocular Movements: Extraocular movements intact.     Conjunctiva/sclera: Conjunctivae normal.     Pupils: Pupils are equal, round, and reactive to light.  Neurological:     Mental Status: She is alert and oriented  to person, place, and time. Mental status is at baseline.  Psychiatric:        Mood and Affect: Mood normal.        Behavior: Behavior normal.        Thought Content: Thought content normal.        Judgment: Judgment normal.     No results found. No results found. No results found for this or any previous visit (from the past 24 hour(s)).  Assessment/Plan: JARELYN BAMBACH is a 76 y.o. female present for OV for  1. Nasal vestibulitis Nasal saline rinse BID Doxy bid  Bactroban ointment bid F/u prn  Reviewed expectations re: course of current medical issues. Discussed self-management of symptoms. Outlined signs and symptoms indicating need for more acute intervention. Patient verbalized understanding and all questions were answered. Patient received an After-Visit Summary.    No orders of the defined types were placed in this encounter.  Meds ordered this encounter  Medications   doxycycline (VIBRA-TABS) 100 MG tablet    Sig: Take 1 tablet (100 mg total) by mouth 2 (two) times daily for 10 days.    Dispense:  20 tablet    Refill:  0   mupirocin ointment (BACTROBAN) 2 %    Sig: Apply 1 Application topically 3 (three) times daily.    Dispense:  15 g    Refill:  0   Referral Orders  No referral(s) requested today     Note is dictated utilizing voice recognition software. Although note has been proof read prior to signing, occasional typographical errors still can be missed. If any questions arise, please do not hesitate to call for verification.   electronically signed by:  Howard Pouch, DO  [place]

## 2022-02-21 ENCOUNTER — Telehealth: Payer: Self-pay | Admitting: Family Medicine

## 2022-02-21 NOTE — Telephone Encounter (Signed)
Patient called to inquire about the pharmacy her doxycycline was sent to. [facility] and [facility] also [facility].

## 2022-03-08 ENCOUNTER — Other Ambulatory Visit: Payer: Self-pay

## 2022-03-14 DIAGNOSIS — S12591D Other nondisplaced fracture of sixth cervical vertebra, subsequent encounter for fracture with routine healing: Secondary | ICD-10-CM | POA: Diagnosis not present

## 2022-03-14 DIAGNOSIS — M542 Cervicalgia: Secondary | ICD-10-CM | POA: Diagnosis not present

## 2022-03-14 DIAGNOSIS — M4722 Other spondylosis with radiculopathy, cervical region: Secondary | ICD-10-CM | POA: Diagnosis not present

## 2022-03-19 NOTE — Progress Notes (Unsigned)
[place] Phone: [phone] Subjective:   [name], am serving as a scribe for Dr. [name].  I'm seeing this patient by the request  of:  [name], Kelsey A, DO  CC: back and neck pain f/u   [id number]  Kelsey [name] is a 76 y.o. female coming in with complaint of back and neck pain. OMT on 02/15/2022. F/u on fracture cervical. Patient states that she swam today for the first time since injury. Felt weak but no pain. Tight in neck when she tried to flex forward and to the left.   Would like opinion on if she would benefit from fusion of C5-7. Numbness in R arm to fingertips intermittently throughout the day.   Also complaining of pain in R ischial tuberosity that radiates around the front of her hip. Unsure if she injured area in the fall she had beginning of summer.   Patient notices collapse of medial longitudinal arch when she walks and wonders if she needs new pair of orthotics.    Medications patient has been prescribed: None  Taking:         Reviewed prior external information including notes and imaging from previsou exam, outside providers and external EMR if available.   As well as notes that were available from care everywhere and other healthcare systems.  Past medical history, social, surgical and family history all reviewed in electronic medical record.  No pertanent information unless stated regarding to the chief complaint.   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    Uterine fibroid 11/2012   See Dr. Cletis Media note, surgery 11/2012; calcified- multiple   Vaginitis 09/13/2011    Allergies  Allergen Reactions   Penicillins Rash    Has patient had a PCN reaction causing immediate rash, facial/tongue/throat swelling, SOB or lightheadedness with hypotension: no Has patient had a PCN reaction causing severe rash involving mucus membranes or skin necrosis: yes Has patient had a PCN reaction that required hospitalization: no Has patient had a PCN reaction occurring within the last 10 years: no If all of the above answers are "NO", then may proceed with Cephalosporin use.    Sulfa Antibiotics Rash    Under arm and thighs     Review of Systems:  No headache, visual changes, nausea, vomiting, diarrhea, constipation, dizziness, abdominal pain, skin rash, fevers, chills, night sweats, weight loss, swollen lymph nodes, body aches, joint swelling, chest pain, shortness of breath, mood changes. POSITIVE muscle aches  Objective  Blood pressure 96/66, pulse 89, height '5\' 5"'$  (1.651 m), weight 109 lb (49.4 kg), SpO2 97 %.   General: No apparent distress alert and oriented x3 mood and affect normal,  dressed appropriately.  HEENT: Pupils equal, extraocular movements intact  Respiratory: Patient's speak in full sentences and does not appear short of breath  Cardiovascular: No lower extremity edema, non tender, no erythema  MSK:  Back: Significant loss of lordosis.  Patient has only 5 degrees of extension.  Good flexion of the back.  Patient does have some mild rotation of the ilium on the right side.  Neck exam does show a positive Spurling's with radicular symptoms on the right side.  Patient does have weakness in the C8 distribution on the right side as  well.  Osteopathic findings  T8 extended rotated and side bent left L2 flexed rotated and side bent right Sacrum right on right     Assessment and Plan:  Degenerative lumbar spinal stenosis Known severe arthritic changes noted.  We discussed with patient to continue to work on core strengthening.  Discussed which activities to do and which ones to avoid.  Increase activity slowly otherwise.  Follow-up with me again in 6 to 8 weeks.    Closed fracture of transverse process of cervical vertebra ([place]) SureDid discuss with patient's neck.  Is having radicular symptoms in the C8 distribution with weakness noted right greater than left.  Positive Spurling's noted.  Patient is seen in neurosurgery and they are discussing surgical intervention.    Nonallopathic problems  Decision today to treat with OMT was based on Physical Exam  After verbal consent patient was treated with  ME, FPR techniques in  ribhoracic, lumbar, and sacral  areas  Patient tolerated the procedure well with improvement in symptoms  Patient given exercises, stretches and lifestyle modifications  See medications in patient instructions if given  Patient will follow up in 4-8 weeks    The above documentation has been reviewed and is accurate and complete Lyndal Pulley, DO          Note: This dictation was prepared with Dragon dictation along with smaller phrase technology. Any transcriptional errors that result from this process are unintentional.

## 2022-03-20 ENCOUNTER — Ambulatory Visit (INDEPENDENT_AMBULATORY_CARE_PROVIDER_SITE_OTHER): Payer: Medicare PPO | Admitting: Family Medicine

## 2022-03-20 ENCOUNTER — Telehealth: Payer: Self-pay | Admitting: Family Medicine

## 2022-03-20 VITALS — BP 96/66 | HR 89 | Ht 65.0 in | Wt 109.0 lb

## 2022-03-20 DIAGNOSIS — M9902 Segmental and somatic dysfunction of thoracic region: Secondary | ICD-10-CM

## 2022-03-20 DIAGNOSIS — S129XXA Fracture of neck, unspecified, initial encounter: Secondary | ICD-10-CM

## 2022-03-20 DIAGNOSIS — M9903 Segmental and somatic dysfunction of lumbar region: Secondary | ICD-10-CM

## 2022-03-20 DIAGNOSIS — M9904 Segmental and somatic dysfunction of sacral region: Secondary | ICD-10-CM | POA: Diagnosis not present

## 2022-03-20 DIAGNOSIS — M48061 Spinal stenosis, lumbar region without neurogenic claudication: Secondary | ICD-10-CM

## 2022-03-20 NOTE — Patient Instructions (Addendum)
I think the back should feel much better Keep talking to neurosurgery about neck Watch for symptoms of instability we dicussed

## 2022-03-20 NOTE — Assessment & Plan Note (Signed)
Known severe arthritic changes noted.  We discussed with patient to continue to work on core strengthening.  Discussed which activities to do and which ones to avoid.  Increase activity slowly otherwise.  Follow-up with me again in 6 to 8 weeks.

## 2022-03-20 NOTE — Telephone Encounter (Signed)
Pt is scheduled for tomorrow 03/21/22 for a medication refill, however she asked me to send a message back about Prolia. She mentions that she was suppose to receive a call to discuss further and has not and to also discuss scheduling a mammogram

## 2022-03-20 NOTE — Telephone Encounter (Signed)
Last prolia inj in 05/2021. Pt is also due for mammogram that month

## 2022-03-20 NOTE — Assessment & Plan Note (Signed)
SureDid discuss with patient's neck.  Is having radicular symptoms in the C8 distribution with weakness noted right greater than left.  Positive Spurling's noted.  Patient is seen in neurosurgery and they are discussing surgical intervention.

## 2022-03-20 NOTE — Telephone Encounter (Signed)
Prolia approved until 06/2022

## 2022-03-21 ENCOUNTER — Encounter: Payer: Self-pay | Admitting: Family Medicine

## 2022-03-21 ENCOUNTER — Ambulatory Visit (INDEPENDENT_AMBULATORY_CARE_PROVIDER_SITE_OTHER): Payer: Medicare PPO | Admitting: Family Medicine

## 2022-03-21 ENCOUNTER — Ambulatory Visit (HOSPITAL_BASED_OUTPATIENT_CLINIC_OR_DEPARTMENT_OTHER)
Admission: RE | Admit: 2022-03-21 | Discharge: 2022-03-21 | Disposition: A | Discharge status: Home / Self Care | Payer: Medicare PPO | Source: Ambulatory Visit | Attending: Family Medicine | Admitting: Family Medicine

## 2022-03-21 ENCOUNTER — Telehealth: Payer: Self-pay

## 2022-03-21 ENCOUNTER — Telehealth: Payer: Self-pay | Admitting: Family Medicine

## 2022-03-21 VITALS — BP 92/56 | HR 77 | Temp 98.0°F | Ht 65.0 in | Wt 109.0 lb

## 2022-03-21 DIAGNOSIS — Z87828 Personal history of other (healed) physical injury and trauma: Secondary | ICD-10-CM | POA: Diagnosis not present

## 2022-03-21 DIAGNOSIS — M810 Age-related osteoporosis without current pathological fracture: Secondary | ICD-10-CM

## 2022-03-21 DIAGNOSIS — Z5181 Encounter for therapeutic drug level monitoring: Secondary | ICD-10-CM

## 2022-03-21 DIAGNOSIS — Z1231 Encounter for screening mammogram for malignant neoplasm of breast: Secondary | ICD-10-CM | POA: Diagnosis not present

## 2022-03-21 DIAGNOSIS — S92531A Displaced fracture of distal phalanx of right lesser toe(s), initial encounter for closed fracture: Secondary | ICD-10-CM | POA: Diagnosis not present

## 2022-03-21 DIAGNOSIS — Z23 Encounter for immunization: Secondary | ICD-10-CM

## 2022-03-21 DIAGNOSIS — S99921A Unspecified injury of right foot, initial encounter: Secondary | ICD-10-CM

## 2022-03-21 DIAGNOSIS — E782 Mixed hyperlipidemia: Secondary | ICD-10-CM

## 2022-03-21 DIAGNOSIS — E559 Vitamin D deficiency, unspecified: Secondary | ICD-10-CM | POA: Diagnosis not present

## 2022-03-21 DIAGNOSIS — M7989 Other specified soft tissue disorders: Secondary | ICD-10-CM | POA: Diagnosis not present

## 2022-03-21 DIAGNOSIS — Z79899 Other long term (current) drug therapy: Secondary | ICD-10-CM

## 2022-03-21 DIAGNOSIS — K219 Gastro-esophageal reflux disease without esophagitis: Secondary | ICD-10-CM | POA: Diagnosis not present

## 2022-03-21 LAB — LIPID PANEL
Cholesterol: 191 mg/dL (ref 0–200)
HDL: 92.5 mg/dL (ref >39.00)
LDL Cholesterol: 84 mg/dL (ref 0–99)
NonHDL: 98.68
Total CHOL/HDL Ratio: 2
Triglycerides: 74 mg/dL (ref 0.0–149.0)
VLDL: 14.8 mg/dL (ref 0.0–40.0)

## 2022-03-21 LAB — MAGNESIUM: Magnesium: 2.1 mg/dL (ref 1.5–2.5)

## 2022-03-21 LAB — TSH: TSH: 1.12 u[IU]/mL (ref 0.35–5.50)

## 2022-03-21 LAB — VITAMIN D 25 HYDROXY (VIT D DEFICIENCY, FRACTURES): VITD: 24.97 ng/mL — ABNORMAL LOW (ref 30.00–100.00)

## 2022-03-21 MED ORDER — OMEPRAZOLE 40 MG PO CPDR: 40.0000 mg | DELAYED_RELEASE_CAPSULE | Freq: Every day | ORAL | 1 refills | Status: DC

## 2022-03-21 MED ORDER — OMEPRAZOLE 40 MG PO CPDR: 40.0000 mg | DELAYED_RELEASE_CAPSULE | Freq: Every day | ORAL | 0 refills | Status: DC

## 2022-03-21 MED ORDER — DENOSUMAB 60 MG/ML ~~LOC~~ SOSY
60.0000 mg | PREFILLED_SYRINGE | Freq: Once | SUBCUTANEOUS | Status: AC
  Administered 2022-03-21: 60 mg via SUBCUTANEOUS

## 2022-03-21 MED ORDER — ATORVASTATIN CALCIUM 10 MG PO TABS
10.0000 mg | ORAL_TABLET | Freq: Every evening | ORAL | 3 refills | Status: DC
Start: 2022-03-21 — End: 2022-11-07

## 2022-03-21 NOTE — Telephone Encounter (Signed)
Spoke with patient regarding results/recommendations.  

## 2022-03-21 NOTE — Telephone Encounter (Signed)
Please call patient: She does have a small fracture at the very tip of her toe. Please encourage her to rest and keep her foot elevated tonight. Set her up for a visit tomorrow so that we can buddy splint her and put her in a postop shoe to allow for proper healing

## 2022-03-21 NOTE — Telephone Encounter (Signed)
Patient was seen by Dr. [name] earlier, patient states she left [place] to get xray.  Euretha forget to ask Dr. [name] is she is able to walk on her foot?  Please call patient at [phone]

## 2022-03-21 NOTE — Progress Notes (Signed)
Kelsey [name] , [birth year], 76 y.o., female MRN: [id number] Patient Care Team    Relationship Specialty Notifications Start End  [name], DO PCP - General Family Medicine  05/27/15   [name], MD Consulting Physician Gastroenterology  12/08/15   Calvert Cantor, MD Consulting Physician Ophthalmology  12/08/15   Lyndal Pulley, DO Consulting Physician Family Medicine  04/07/18   Cameron Sprang, MD Consulting Physician Neurology  01/23/21     Chief Complaint  Patient presents with   Gastroesophageal Reflux    [facility]mc; pt is not fasting   Toe Injury    Pt injured 3rd R toe last night; presents black blue swollen     Subjective: Pt presents for an OV follow-up on her chronic medical conditions.  Osteoporosis/vit d def: She is due for her Prolia shot today.  She also realizes she has not been taking her vitamin D for some time.   Hyperlipidemia: Patient reports she is tolerating Lipitor 10 mg  GERD: Reports she is stable when taking the omeprazole 40 mg daily.  Right toe injury: Patient reports she injured her toe last night by hitting it against a piece of furniture.  She states she woke up with it swollen and bruised.  She feels it is very tender     10/17/2021    8:46 AM 09/06/2021   11:59 AM 05/08/2021    9:36 AM 08/19/2020   10:28 AM 08/17/2020    9:50 AM  Depression screen PHQ 2/9  Decreased Interest 0 0 0 0 0  Down, Depressed, Hopeless 0 0 0 1 0  PHQ - 2 Score 0 0 0 1 0  Altered sleeping    0   Tired, decreased energy    3   Change in appetite    0   Feeling bad or failure about yourself     1   Trouble concentrating    2   Moving slowly or fidgety/restless    0   Suicidal thoughts    0   PHQ-9 Score    7       08/19/2020   10:28 AM  GAD 7 : Generalized Anxiety Score  Nervous, Anxious, on Edge 2  Control/stop worrying 2  Worry too much - different things 2  Trouble relaxing 2  Restless 1  Easily annoyed or irritable 1  Afraid - awful might happen  2  Total GAD 7 Score 12    Allergies  Allergen Reactions   Penicillins Rash    Has patient had a PCN reaction causing immediate rash, facial/tongue/throat swelling, SOB or lightheadedness with hypotension: no Has patient had a PCN reaction causing severe rash involving mucus membranes or skin necrosis: yes Has patient had a PCN reaction that required hospitalization: no Has patient had a PCN reaction occurring within the last 10 years: no If all of the above answers are "NO", then may proceed with Cephalosporin use.    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    Uterine fibroid 11/2012   See Dr. Cletis Media note, surgery 11/2012; calcified- multiple   Vaginitis 09/13/2011   Past Surgical History:  Procedure Laterality Date   DILATATION & CURRETTAGE/HYSTEROSCOPY WITH RESECTOCOPE N/A 11/06/2012   Procedure: [place];  Surgeon: Alwyn Pea, MD;  Location: [place] [facility];  Service: Gynecology;  Laterality: N/A;   HERNIA REPAIR  1992   I & D EXTREMITY Left 03/08/2021   Procedure: IRRIGATION AND DEBRIDEMENT OF THUMB AND ARM;  Surgeon: Leanora Cover, MD;  Location: [place];  Service: Orthopedics;  Laterality: Left;    small intestine hernia  1994   WISDOM TOOTH EXTRACTION     Family History  Problem Relation Age of Onset   Heart disease Mother    Osteoporosis Mother    Cancer Father        prostate   Osteoporosis Sister    Gout Brother    Gout Maternal Grandmother    Heart disease Maternal Grandmother    Alzheimer's disease Maternal Grandfather    Osteoporosis Sister    Allergies as of 03/21/2022       Reactions   Penicillins Rash   Has patient had a PCN reaction causing immediate rash, facial/tongue/throat swelling, SOB or lightheadedness with hypotension: no Has patient had a PCN reaction causing severe rash involving mucus membranes or skin necrosis: yes Has patient had a PCN reaction that required hospitalization: no Has patient had a PCN reaction occurring within the last 10 years: no If all of the above answers are "NO", then may proceed with Cephalosporin use.   Sulfa Antibiotics Rash   Under arm and thighs        Medication List        Accurate as of March 21, 2022  4:27 PM. If you have any questions, ask your nurse or doctor.          STOP taking these medications    mupirocin ointment 2 % Commonly known as: BACTROBAN Stopped by: Howard Pouch, DO       TAKE these medications    atorvastatin 10 MG tablet Commonly known as: LIPITOR Take 1 tablet (10 mg total) by mouth every evening.   omeprazole 40 MG capsule Commonly known as: PRILOSEC Take 1 capsule (40 mg total) by mouth daily. What changed: Another medication with the same name was added. Make sure you understand how and when to take each. Changed by: Howard Pouch, DO   omeprazole 40 MG capsule Commonly known as: PRILOSEC Take 1 capsule (40 mg total) by mouth daily. What changed: You were already taking a medication with the same name, and this prescription was added. Make sure you understand how and when to take each. Changed by: Howard Pouch, DO   Vitamin D (Ergocalciferol) 1.25 MG (50000 UNIT)  Caps capsule Commonly known as: DRISDOL Take 1 capsule (50,000 Units total) by mouth every 7 (seven) days.        All past medical history, surgical history, allergies, family history, immunizations andmedications were  updated in the EMR today and reviewed under the history and medication portions of their EMR.     ROS: Negative, with the exception of above mentioned in HPI   Objective:  BP (!) 92/56   Pulse 77   Temp 98 F (36.7 C) (Oral)   Ht '5\' 5"'$  (1.651 m)   Wt 109 lb (49.4 kg)   SpO2 97%   BMI 18.14 kg/m  Body mass index is 18.14 kg/m. Physical Exam Vitals and nursing note reviewed.  Constitutional:      General: She is not in acute distress.    Appearance: Normal appearance. She is not ill-appearing, toxic-appearing or diaphoretic.  HENT:     Head: Normocephalic and atraumatic.     Mouth/Throat:     Mouth: Mucous membranes are moist.  Eyes:     General: No scleral icterus.       Right eye: No discharge.        Left eye: No discharge.     Extraocular Movements: Extraocular movements intact.     Conjunctiva/sclera: Conjunctivae normal.     Pupils: Pupils are equal, round, and reactive to light.  Cardiovascular:     Rate and Rhythm: Normal rate and regular rhythm.  Pulmonary:     Effort: Pulmonary effort is normal. No respiratory distress.     Breath sounds: Normal breath sounds. No wheezing, rhonchi or rales.  Musculoskeletal:        General: Swelling, tenderness, deformity and signs of injury present.     Cervical back: Neck supple. No tenderness.     Right lower leg: No edema.     Left lower leg: No edema.     Comments: Right third toe: Swelling, bruising present over DIP joint.  Tender to palpation.  Neurovascular intact  Lymphadenopathy:     Cervical: No cervical adenopathy.  Skin:    General: Skin is warm and dry.     Coloration: Skin is not jaundiced or pale.     Findings: No erythema or rash.  Neurological:     Mental Status: She is alert and  oriented to person, place, and time. Mental status is at baseline.     Motor: No weakness.     Gait: Gait normal.  Psychiatric:        Mood and Affect: Mood normal.        Behavior: Behavior normal.        Thought Content: Thought content normal.        Judgment: Judgment normal.    No results found. No results found.  Assessment/Plan: LEONNA SCHLEE is a 76 y.o. female present for OV for [facility] B12 deficiency/vitamin D deficiency Patient has not been taking her vitamin D. We will check levels today and if needed replace with high-dose once weekly again.  Mixed hyperlipidemia/ Encounter for monitoring statin therapy Continue atorvastatin to help provide cardiovascular protection.    Osteoporosis without current pathological fracture, unspecified osteoporosis type/Vitamin D deficiency DEXA up-to-date Restart Prolia injection today  GERD: Stable Continue omeprazole 40 mg daily  Osteoporosis without current pathological fracture, unspecified osteoporosis type - Vitamin D (25 hydroxy) - TSH - denosumab (PROLIA) injection 60 mg q 6 mos  Breast cancer screening by mammogram - MM 3D SCREEN BREAST BILATERAL; Future Encounter for monitoring long-term proton pump inhibitor therapy - Magnesium Need for influenza vaccination - Flu Vaccine QUAD High Dose(Fluad)  Toe injury, right, initial encounter Patient reports she hit her toe yesterday and woke with it bruised and  swollen today.  It is painful to bear weight. - DG Toe 3rd Right; Future  Reviewed expectations re: course of current medical issues. Discussed self-management of symptoms. Outlined signs and symptoms indicating need for more acute intervention. Patient verbalized understanding and all questions were answered. Patient received an After-Visit Summary.    Orders Placed This Encounter  Procedures   MM 3D SCREEN BREAST BILATERAL   DG Toe 3rd Right   Flu Vaccine QUAD High Dose(Fluad)   Lipid panel   Vitamin D  (25 hydroxy)   TSH   Magnesium   Meds ordered this encounter  Medications   omeprazole (PRILOSEC) 40 MG capsule    Sig: Take 1 capsule (40 mg total) by mouth daily.    Dispense:  90 capsule    Refill:  1   atorvastatin (LIPITOR) 10 MG tablet    Sig: Take 1 tablet (10 mg total) by mouth every evening.    Dispense:  90 tablet    Refill:  3   denosumab (PROLIA) injection 60 mg    Order Specific Question:   Patient is enrolled in [facility]S program for this medication and I have provided a copy of the Prolia Medication Guide and Patient Brochure.    Answer:   No    Order Specific Question:   I have reviewed with the patient the information in the Prolia Medication Guide and Patient Counseling Chart including the serious risks of Prolia and symptoms of each risk.    Answer:   Yes    Order Specific Question:   I have advised the patient to seek medical attention if they have signs or symptoms of any of the serious risks.    Answer:   Yes   omeprazole (PRILOSEC) 40 MG capsule    Sig: Take 1 capsule (40 mg total) by mouth daily.    Dispense:  30 capsule    Refill:  0    Short course supple   Referral Orders  No referral(s) requested today     Note is dictated utilizing voice recognition software. Although note has been proof read prior to signing, occasional typographical errors still can be missed. If any questions arise, please do not hesitate to call for verification.   electronically signed by:  Howard Pouch, DO  [place]

## 2022-03-21 NOTE — Telephone Encounter (Signed)
LVM for pt to CB regarding foot. Pt is to rest as much as possible but can use PRN.

## 2022-03-22 ENCOUNTER — Encounter: Payer: Self-pay | Admitting: Family Medicine

## 2022-03-22 ENCOUNTER — Ambulatory Visit: Payer: Medicare PPO

## 2022-03-22 ENCOUNTER — Ambulatory Visit (INDEPENDENT_AMBULATORY_CARE_PROVIDER_SITE_OTHER): Payer: Medicare PPO | Admitting: Family Medicine

## 2022-03-22 VITALS — BP 98/60 | HR 76 | Temp 97.9°F | Ht 65.0 in | Wt 111.0 lb

## 2022-03-22 DIAGNOSIS — S92534D Nondisplaced fracture of distal phalanx of right lesser toe(s), subsequent encounter for fracture with routine healing: Secondary | ICD-10-CM

## 2022-03-22 NOTE — Progress Notes (Signed)
Kelsey [name] , 06-Jun-1946, 76 y.o., female MRN: [id number] Patient Care Team    Relationship Specialty Notifications Start End  [name], DO PCP - General Family Medicine  05/27/15   [name], MD Consulting Physician Gastroenterology  12/08/15   Calvert Cantor, MD Consulting Physician Ophthalmology  12/08/15   Lyndal Pulley, DO Consulting Physician Family Medicine  04/07/18   Cameron Sprang, MD Consulting Physician Neurology  01/23/21     Chief Complaint  Patient presents with   Toe Injury     Subjective: Pt presents for an OV follow-up on new dx of toe fracture.  Patient present today to rereview results of x-ray and have splinting placed of new toe fracture. Right toe injury: Patient presented for her chronic medical condition follow-up appointment with a new acute injury.  Covered her chronic medical conditions at her last appointment and ordered x-ray on her injured toe.  X-ray resulted with a small fracture of her right third distal phalanx. Prior note: Patient reports she injured her toe last night by hitting it against a piece of furniture.  She states she woke up with it swollen and bruised.  She feels it is very tender   No results found.     10/17/2021    8:46 AM 09/06/2021   11:59 AM 05/08/2021    9:36 AM 08/19/2020   10:28 AM 08/17/2020    9:50 AM  Depression screen PHQ 2/9  Decreased Interest 0 0 0 0 0  Down, Depressed, Hopeless 0 0 0 1 0  PHQ - 2 Score 0 0 0 1 0  Altered sleeping    0   Tired, decreased energy    3   Change in appetite    0   Feeling bad or failure about yourself     1   Trouble concentrating    2   Moving slowly or fidgety/restless    0   Suicidal thoughts    0   PHQ-9 Score    7       08/19/2020   10:28 AM  GAD 7 : Generalized Anxiety Score  Nervous, Anxious, on Edge 2  Control/stop worrying 2  Worry too much - different things 2  Trouble relaxing 2  Restless 1  Easily annoyed or irritable 1  Afraid - awful might  happen 2  Total GAD 7 Score 12    Allergies  Allergen Reactions   Penicillins Rash    Has patient had a PCN reaction causing immediate rash, facial/tongue/throat swelling, SOB or lightheadedness with hypotension: no Has patient had a PCN reaction causing severe rash involving mucus membranes or skin necrosis: yes Has patient had a PCN reaction that required hospitalization: no Has patient had a PCN reaction occurring within the last 10 years: no If all of the above answers are "NO", then may proceed with Cephalosporin use.    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    Uterine fibroid 11/2012   See Dr. Cletis Media note, surgery 11/2012; calcified- multiple   Vaginitis 09/13/2011   Past Surgical History:  Procedure Laterality Date   DILATATION & CURRETTAGE/HYSTEROSCOPY WITH RESECTOCOPE N/A 11/06/2012   Procedure: [place];  Surgeon: Alwyn Pea, MD;  Location: [place] [facility];  Service: Gynecology;  Laterality: N/A;   HERNIA REPAIR  1992   I & D EXTREMITY Left 03/08/2021   Procedure: IRRIGATION AND DEBRIDEMENT OF THUMB AND ARM;  Surgeon: Leanora Cover, MD;  Location: [place];  Service: Orthopedics;  Laterality:  Left;   small intestine hernia  1994   WISDOM TOOTH EXTRACTION     Family History  Problem Relation Age of Onset   Heart disease Mother    Osteoporosis Mother    Cancer Father        prostate   Osteoporosis Sister    Gout Brother    Gout Maternal Grandmother    Heart disease Maternal Grandmother    Alzheimer's disease Maternal Grandfather    Osteoporosis Sister    Allergies as of 03/22/2022       Reactions   Penicillins Rash   Has patient had a PCN reaction causing immediate rash, facial/tongue/throat swelling, SOB or lightheadedness with hypotension: no Has patient had a PCN reaction causing severe rash involving mucus membranes or skin necrosis: yes Has patient had a PCN reaction that required hospitalization: no Has patient had a PCN reaction occurring within the last 10 years: no If all of the above answers are "NO", then may proceed with Cephalosporin use.   Sulfa Antibiotics Rash   Under arm and thighs        Medication List        Accurate as of March 22, 2022 10:29 AM. If you have any questions, ask your nurse or doctor.          atorvastatin 10 MG tablet Commonly known as: LIPITOR Take 1 tablet (10 mg total) by mouth every evening.   omeprazole 40 MG capsule Commonly known as: PRILOSEC Take 1 capsule (40 mg total) by mouth daily.   omeprazole 40 MG capsule Commonly known as: PRILOSEC Take 1 capsule (40 mg total) by mouth daily.   Vitamin D (Ergocalciferol) 1.25 MG (50000 UNIT) Caps capsule Commonly known as: DRISDOL Take 1 capsule (50,000 Units total) by mouth every 7 (seven) days.        All past medical history, surgical history, allergies, family history, immunizations andmedications were updated in the EMR today and reviewed under the history and medication portions of their EMR.     ROS: Negative, with the exception of above mentioned in HPI   Objective:  BP 98/60   Pulse 76   Temp 97.9 F (36.6 C) (Oral)   Ht '5\' 5"'$  (1.651 m)    Wt 111 lb (50.3 kg)   SpO2 97%   BMI 18.47 kg/m  Body mass index is 18.47 kg/m. Physical Exam Vitals and nursing note reviewed.  Constitutional:      General: She is not in acute distress.  Appearance: Normal appearance. She is not ill-appearing, toxic-appearing or diaphoretic.  Musculoskeletal:        General: Swelling, tenderness, deformity and signs of injury present.     Right lower leg: No edema.     Left lower leg: No edema.     Comments: Right third toe: Swelling, bruising present over DIP joint.  Tender to palpation.  Neurovascular intact  Skin:    General: Skin is warm and dry.     Coloration: Skin is not jaundiced or pale.     Findings: No erythema or rash.  Neurological:     Mental Status: She is alert and oriented to person, place, and time. Mental status is at baseline.     Motor: No weakness.     Gait: Gait normal.  Psychiatric:        Mood and Affect: Mood normal.        Behavior: Behavior normal.        Thought Content: Thought content normal.        Judgment: Judgment normal.      Assessment/Plan: Kelsey [name] is a 76 y.o. female present for OV for Closed nondisplaced fracture of distal phalanx of lesser toe of right foot with routine healing, subsequent encounter - reviewed xray with patient today. Interpreted xray.  - placed pt in buddy splint today. Provided instruction on proper re-application of buddy splint if needed.  Post op shoe provided to help avoid toe movement.  - recommended she remain in splints/shoe for 3 weeks.  If pain is present on weight bearing after 3 weeks, she should follow up at that time.      Reviewed expectations re: course of current medical issues. Discussed self-management of symptoms. Outlined signs and symptoms indicating need for more acute intervention. Patient verbalized understanding and all questions were answered. Patient received an After-Visit Summary.    No orders of the defined types were placed  in this encounter.  No orders of the defined types were placed in this encounter.  Referral Orders  No referral(s) requested today     Note is dictated utilizing voice recognition software. Although note has been proof read prior to signing, occasional typographical errors still can be missed. If any questions arise, please do not hesitate to call for verification.   electronically signed by:  Howard Pouch, DO  [place]

## 2022-03-23 DIAGNOSIS — H02834 Dermatochalasis of left upper eyelid: Secondary | ICD-10-CM | POA: Diagnosis not present

## 2022-03-23 DIAGNOSIS — H02831 Dermatochalasis of right upper eyelid: Secondary | ICD-10-CM | POA: Diagnosis not present

## 2022-03-23 DIAGNOSIS — H25813 Combined forms of age-related cataract, bilateral: Secondary | ICD-10-CM | POA: Diagnosis not present

## 2022-03-23 DIAGNOSIS — H524 Presbyopia: Secondary | ICD-10-CM | POA: Diagnosis not present

## 2022-03-23 DIAGNOSIS — H52223 Regular astigmatism, bilateral: Secondary | ICD-10-CM | POA: Diagnosis not present

## 2022-03-23 DIAGNOSIS — H04123 Dry eye syndrome of bilateral lacrimal glands: Secondary | ICD-10-CM | POA: Diagnosis not present

## 2022-03-26 ENCOUNTER — Telehealth: Payer: Self-pay

## 2022-03-26 LAB — HM MAMMOGRAPHY

## 2022-03-26 NOTE — Telephone Encounter (Signed)
Patient calling for 2 things.  Please follow up with patient at [phone].  1) She has been wearing the toe splint.  Her toe has no pain and feels much better now.  Can she stop wearing splint?  2) Patient is wanting to know test results, she mentioned cholesterol and vitamin D.  Does she need meds for cholesterol and vitamin D?  Please advise.

## 2022-03-27 NOTE — Telephone Encounter (Signed)
We discussed during her appointment, she needs to stay in the splint and postop shoe, at least 2 weeks to allow proper healing and typically at least 3 weeks.   If she is feeling completely better at 2 weeks she can try to take off the splint, but I encouraged her to take the splint and issue to [place] with her.  Her lab results, we briefly discussed during her follow-up visit we placed her in the splint.  Her cholesterol looks good with an LDL of 84. Her vitamin D is still mildly low at 24.  I would encourage her to take vitamin D 2000 units OTC daily.

## 2022-03-28 ENCOUNTER — Telehealth: Payer: Self-pay

## 2022-03-28 NOTE — Telephone Encounter (Signed)
Please inform patient her mammogram is normal.  

## 2022-03-28 NOTE — Telephone Encounter (Signed)
Spoke with pt regarding labs and instructions.   

## 2022-03-28 NOTE — Telephone Encounter (Deleted)
Received Mammogram results from [facility] on 03/27/22. Will place on PCP desk for review.

## 2022-03-28 NOTE — Telephone Encounter (Signed)
Encounter in error

## 2022-04-16 NOTE — Progress Notes (Deleted)
[place] Phone: [phone] Subjective:    I'm seeing this patient by the request  of:  [name], [name], DO  CC:   [id number]  Kelsey [name] is a 76 y.o. female coming in with complaint of back and neck pain. OMT on 03/20/2022. Patient states   Medications patient has been prescribed: None  Taking:         Reviewed prior external information including notes and imaging from previsou exam, outside providers and external EMR if available.   As well as notes that were available from care everywhere and other healthcare systems.  Past medical history, social, surgical and family history all reviewed in electronic medical record.  No pertanent information unless stated regarding to the chief complaint.   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    Uterine fibroid 11/2012   See Dr. Cletis Media note, surgery 11/2012; calcified- multiple   Vaginitis 09/13/2011    Allergies  Allergen Reactions   Penicillins Rash    Has patient had a PCN reaction causing immediate rash, facial/tongue/throat swelling, SOB or lightheadedness with hypotension: no Has patient had a PCN reaction causing severe rash involving mucus membranes or skin necrosis: yes Has patient had a PCN reaction that required  hospitalization: no Has patient had a PCN reaction occurring within the last 10 years: no If all of the above answers are "NO", then may proceed with Cephalosporin use.    Sulfa Antibiotics Rash    Under arm and thighs     Review of Systems:  No headache, visual changes, nausea, vomiting, diarrhea, constipation, dizziness, abdominal pain, skin rash, fevers, chills, night sweats, weight loss, swollen lymph nodes, body aches, joint swelling, chest pain, shortness of breath, mood changes. POSITIVE muscle aches  Objective  There were no vitals taken for this visit.   General: No apparent distress alert and oriented x3 mood and affect normal, dressed appropriately.  HEENT: Pupils equal, extraocular movements intact  Respiratory: Patient's speak in full sentences and does not appear short of breath  Cardiovascular: No lower extremity edema, non tender, no erythema  Gait MSK:  Back   Osteopathic findings  C2 flexed rotated and side bent right C6 flexed rotated and side bent left T3 extended rotated and side bent right inhaled rib T9 extended rotated and side bent left L2 flexed rotated and side bent right Sacrum right on right       Assessment and Plan:  No problem-specific Assessment & Plan notes found for this encounter.    Nonallopathic problems  Decision today to treat with OMT was based on Physical Exam  After verbal consent patient was treated with HVLA, ME, FPR techniques in cervical, rib,  thoracic, lumbar, and sacral  areas  Patient tolerated the procedure well with improvement in symptoms  Patient given exercises, stretches and lifestyle modifications  See medications in patient instructions if given  Patient will follow up in 4-8 weeks             Note: This dictation was prepared with Dragon dictation along with smaller phrase technology. Any transcriptional errors that result from this process are unintentional.

## 2022-04-19 ENCOUNTER — Ambulatory Visit: Payer: Medicare PPO | Admitting: Family Medicine

## 2022-04-24 NOTE — Progress Notes (Unsigned)
Kelsey [name] Phone: [phone] Subjective:   Kelsey [name], am serving as a scribe for Dr. [name].  I'm seeing this patient by the request  of:  [name], [name], DO  CC: Back and neck pain follow-up  [id number]  Kelsey [name] is a 76 y.o. female coming in with complaint of back and neck pain. OMT on 03/20/2022. Patient states that when she broke her toe she was given a post-op shoe that made her back hurt.  Has been wearing regular shoes at this moment.  Wonders if toe is still healing. [name] pain but toe is senstive.   Medications patient has been prescribed: None  Taking:         Reviewed prior external information including notes and imaging from previsou exam, outside providers and external EMR if available.   As well as notes that were available from care everywhere and other healthcare systems.  Past medical history, social, surgical and family history all reviewed in electronic medical record.  [name] pertanent information unless stated regarding to the chief complaint.   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    Uterine fibroid 11/2012   See Dr. Cletis Media note, surgery 11/2012; calcified- multiple   Vaginitis 09/13/2011    Allergies  Allergen Reactions   Penicillins Rash     Has patient had a PCN reaction causing immediate rash, facial/tongue/throat swelling, SOB or lightheadedness with hypotension: [name] Has patient had a PCN reaction causing severe rash involving mucus membranes or skin necrosis: yes Has patient had a PCN reaction that required hospitalization: [name] Has patient had a PCN reaction occurring within the last 10 years: [name] If all of the above answers are "[name]", then may proceed with Cephalosporin use.    Sulfa Antibiotics Rash    Under arm and thighs     Review of Systems:  [name] headache, visual changes, nausea, vomiting, diarrhea, constipation,  abdominal pain, skin rash, fevers, chills, night sweats, weight loss, swollen lymph nodes, body aches, joint swelling, chest pain, shortness of breath, mood changes. POSITIVE muscle aches, intermittent dizziness she talks about it but does not have it today.  Objective  Blood pressure 110/74, height '5\' 5"'$  (1.651 m), weight 112 lb (50.8 kg).   General: [name] apparent distress alert and oriented x3 mood and affect normal, dressed appropriately.  HEENT: Pupils equal, extraocular movements intact  Respiratory: Patient's speak in full sentences and does not appear short of breath  Cardiovascular: [name] lower extremity edema, non tender, [name] erythema  Gait antalgic  MSK:  Back low back doe shave loss of lordosis, tightness FABER, only 5 degrees extension    Osteopathic findings  T8 extended rotated and side  bent left L2 flexed rotated and side bent right L4 F RS left  Sacrum right on right    Assessment and Plan:  SI (sacroiliac) joint dysfunction Continue to work on more of the back.  Patient states that he is doing relatively well tightness noted. Discussed OMT, discussed, posture and continue the home exercises patient continues to avoid different medications and regular basis.  Avoided any OMT on the neck today.  Follow-up in 6 to 8 weeks    Nonallopathic problems  Decision today to treat with OMT was  based on Physical Exam  After verbal consent patient was treated with , ME, FPR techniques in ithoracic, lumbar, and sacral  areas  Patient tolerated the procedure well with improvement in symptoms  Patient given exercises, stretches and lifestyle modifications  See medications in patient instructions if given  Patient will follow up in 6-8 weeks     The above documentation has been reviewed and is accurate and complete Lyndal Pulley, DO         Note: This dictation was prepared with Dragon dictation along with smaller phrase technology. Any transcriptional errors that result from this process are unintentional.

## 2022-04-26 ENCOUNTER — Ambulatory Visit (INDEPENDENT_AMBULATORY_CARE_PROVIDER_SITE_OTHER): Payer: Medicare PPO | Admitting: Family Medicine

## 2022-04-26 VITALS — BP 110/74 | Ht 65.0 in | Wt 112.0 lb

## 2022-04-26 DIAGNOSIS — M9902 Segmental and somatic dysfunction of thoracic region: Secondary | ICD-10-CM | POA: Diagnosis not present

## 2022-04-26 DIAGNOSIS — M9903 Segmental and somatic dysfunction of lumbar region: Secondary | ICD-10-CM

## 2022-04-26 DIAGNOSIS — M533 Sacrococcygeal disorders, not elsewhere classified: Secondary | ICD-10-CM | POA: Diagnosis not present

## 2022-04-26 DIAGNOSIS — M9904 Segmental and somatic dysfunction of sacral region: Secondary | ICD-10-CM | POA: Diagnosis not present

## 2022-04-26 NOTE — Patient Instructions (Signed)
If dizziness gets worse seek medical attention Can consider meclazine See me in 6 weeks

## 2022-04-26 NOTE — Assessment & Plan Note (Addendum)
Continue to work on more of the back.  Patient states that he is doing relatively well tightness noted. Discussed OMT, discussed, posture and continue the home exercises patient continues to avoid different medications and regular basis.  Avoided any OMT on the neck today.  Follow-up in 6 to 8 weeks

## 2022-05-01 ENCOUNTER — Ambulatory Visit (INDEPENDENT_AMBULATORY_CARE_PROVIDER_SITE_OTHER): Payer: Medicare PPO | Admitting: Family Medicine

## 2022-05-01 VITALS — BP 94/68 | Temp 97.8°F | Wt 111.4 lb

## 2022-05-01 DIAGNOSIS — H8113 Benign paroxysmal vertigo, bilateral: Secondary | ICD-10-CM | POA: Diagnosis not present

## 2022-05-01 NOTE — Patient Instructions (Signed)
Benign Positional Vertigo Vertigo is the feeling that you or your surroundings are moving when they are not. Benign positional vertigo is the most common form of vertigo. This is usually a harmless condition (benign). This condition is positional. This means that symptoms are triggered by certain movements and positions. This condition can be dangerous if it occurs while you are doing something that could cause harm to yourself or others. This includes activities such as driving or operating machinery. What are the causes? The inner ear has fluid-filled canals that help your brain sense movement and balance. When the fluid moves, the brain receives messages about your body's position. With benign positional vertigo, calcium crystals in the inner ear break free and disturb the inner ear area. This causes your brain to receive confusing messages about your body's position. What increases the risk? You are more likely to develop this condition if: You are a woman. You are 50 years of age or older. You have recently had a head injury. You have an inner ear disease. What are the signs or symptoms? Symptoms of this condition usually happen when you move your head or your eyes in different directions. Symptoms may start suddenly and usually last for less than a minute. They include: Loss of balance and falling. Feeling like you are spinning or moving. Feeling like your surroundings are spinning or moving. Nausea and vomiting. Blurred vision. Dizziness. Involuntary eye movement (nystagmus). Symptoms can be mild and cause only minor problems, or they can be severe and interfere with daily life. Episodes of benign positional vertigo may return (recur) over time. Symptoms may also improve over time. How is this diagnosed? This condition may be diagnosed based on: Your medical history. A physical exam of the head, neck, and ears. Positional tests to check for or stimulate vertigo. You may be asked to  turn your head and change positions, such as going from sitting to lying down. A health care provider will watch for symptoms of vertigo. You may be referred to a health care provider who specializes in ear, nose, and throat problems (ENT or otolaryngologist) or a provider who specializes in disorders of the nervous system (neurologist). How is this treated?  This condition may be treated in a session in which your health care provider moves your head in specific positions to help the displaced crystals in your inner ear move. Treatment for this condition may take several sessions. Surgery may be needed in severe cases, but this is rare. In some cases, benign positional vertigo may resolve on its own in 2-4 weeks. Follow these instructions at home: Safety Move slowly. Avoid sudden body or head movements or certain positions, as told by your health care provider. Avoid driving or operating machinery until your health care provider says it is safe. Avoid doing any tasks that would be dangerous to you or others if vertigo occurs. If you have trouble walking or keeping your balance, try using a cane for stability. If you feel dizzy or unstable, sit down right away. Return to your normal activities as told by your health care provider. Ask your health care provider what activities are safe for you. General instructions Take over-the-counter and prescription medicines only as told by your health care provider. Drink enough fluid to keep your urine pale yellow. Keep all follow-up visits. This is important. Contact a health care provider if: You have a fever. Your condition gets worse or you develop new symptoms. Your family or friends notice any behavioral changes.   You have nausea or vomiting that gets worse. You have numbness or a prickling and tingling sensation. Get help right away if you: Have difficulty speaking or moving. Are always dizzy or faint. Develop severe headaches. Have weakness in  your legs or arms. Have changes in your hearing or vision. Develop a stiff neck. Develop sensitivity to light. These symptoms may represent a serious problem that is an emergency. Do not wait to see if the symptoms will go away. Get medical help right away. Call your local emergency services (911 in the [place] Do not drive yourself to the hospital. Summary Vertigo is the feeling that you or your surroundings are moving when they are not. Benign positional vertigo is the most common form of vertigo. This condition is caused by calcium crystals in the inner ear that become displaced. This causes a disturbance in an area of the inner ear that helps your brain sense movement and balance. Symptoms include loss of balance and falling, feeling that you or your surroundings are moving, nausea and vomiting, and blurred vision. This condition can be diagnosed based on symptoms, a physical exam, and positional tests. Follow safety instructions as told by your health care provider and keep all follow-up visits. This is important. This information is not intended to replace advice given to you by your health care provider. Make sure you discuss any questions you have with your health care provider. Document Revised: 05/25/2020 Document Reviewed: 05/25/2020 Elsevier Patient Education  2023 Elsevier Inc.  

## 2022-05-01 NOTE — Progress Notes (Unsigned)
Kelsey [name] , 10-13-1945, 76 y.o., female MRN: [id number] Patient Care Team    Relationship Specialty Notifications Start End  [name], DO PCP - General Family Medicine  05/27/15   [name], MD Consulting Physician Gastroenterology  12/08/15   Calvert Cantor, MD Consulting Physician Ophthalmology  12/08/15   Lyndal Pulley, DO Consulting Physician Family Medicine  04/07/18   Cameron Sprang, MD Consulting Physician Neurology  01/23/21     Chief Complaint  Patient presents with   Dizziness    Sports med suggested vertigo but to see PCP     Subjective: Pt presents for an OV with complaints of spinning sensation.  She reports symptoms will last for just a couple seconds and then resolved.  Occur intermittently, not daily.  She states that the fall she had in July was from the vertigo.  Denies any syncope or ringing in her ears.  He endorses right-sided neck pain since her fall.  She had a C6 vertebral fracture and MRI also resulted with moderate-severe facet stenosis on the right.  She does endorse occasional radiculopathy sensation down her right arm into her hand.  There is mild cervical spinal stenosis present as well.  She is in contact with the neurosurgeon and is discussing the possibility of surgical correction.     10/17/2021    8:46 AM 09/06/2021   11:59 AM 05/08/2021    9:36 AM 08/19/2020   10:28 AM 08/17/2020    9:50 AM  Depression screen PHQ 2/9  Decreased Interest 0 0 0 0 0  Down, Depressed, Hopeless 0 0 0 1 0  PHQ - 2 Score 0 0 0 1 0  Altered sleeping    0   Tired, decreased energy    3   Change in appetite    0   Feeling bad or failure about yourself     1   Trouble concentrating    2   Moving slowly or fidgety/restless    0   Suicidal thoughts    0   PHQ-9 Score    7     Allergies  Allergen Reactions   Penicillins Rash    Has patient had a PCN reaction causing immediate rash, facial/tongue/throat swelling, SOB or lightheadedness with  hypotension: no Has patient had a PCN reaction causing severe rash involving mucus membranes or skin necrosis: yes Has patient had a PCN reaction that required hospitalization: no Has patient had a PCN reaction occurring within the last 10 years: no If all of the above answers are "NO", then may proceed with Cephalosporin use.    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    Uterine fibroid 11/2012   See Dr. Cletis Media note, surgery 11/2012; calcified- multiple   Vaginitis 09/13/2011   Past Surgical History:  Procedure Laterality Date   DILATATION & CURRETTAGE/HYSTEROSCOPY WITH RESECTOCOPE N/A 11/06/2012   Procedure: [place];  Surgeon: Alwyn Pea, MD;  Location: [facility];  Service: Gynecology;  Laterality: N/A;   HERNIA REPAIR  1992   I & D EXTREMITY Left 03/08/2021   Procedure: IRRIGATION AND DEBRIDEMENT OF THUMB AND ARM;  Surgeon: Leanora Cover, MD;  Location: [place];  Service: Orthopedics;  Laterality: Left;   small intestine hernia  1994   WISDOM TOOTH EXTRACTION     Family History  Problem Relation Age of Onset   Heart disease Mother    Osteoporosis Mother    Cancer Father        prostate    Osteoporosis Sister    Gout Brother    Gout Maternal Grandmother    Heart disease Maternal Grandmother    Alzheimer's disease Maternal Grandfather    Osteoporosis Sister    Allergies as of 05/01/2022       Reactions   Penicillins Rash   Has patient had a PCN reaction causing immediate rash, facial/tongue/throat swelling, SOB or lightheadedness with hypotension: no Has patient had a PCN reaction causing severe rash involving mucus membranes or skin necrosis: yes Has patient had a PCN reaction that required hospitalization: no Has patient had a PCN reaction occurring within the last 10 years: no If all of the above answers are "NO", then may proceed with Cephalosporin use.   Sulfa Antibiotics Rash   Under arm and thighs        Medication List        Accurate as of May 01, 2022 11:59 PM. If you have any questions, ask your nurse or doctor.          atorvastatin 10 MG tablet Commonly known as: LIPITOR Take 1 tablet (10 mg total) by mouth every evening.   omeprazole 40 MG capsule Commonly known as: PRILOSEC Take 1 capsule (40 mg total) by mouth daily.   omeprazole 40 MG capsule Commonly known as: PRILOSEC Take 1 capsule (40 mg total) by mouth daily.   Vitamin D (Ergocalciferol) 1.25 MG (50000 UNIT) Caps capsule Commonly known as: DRISDOL Take 1 capsule (50,000 Units total) by mouth every 7 (seven) days.        All past medical history, surgical history, allergies, family history, immunizations andmedications were updated in the EMR today and reviewed under the history and medication portions of their EMR.     ROS Negative, with the exception of above mentioned in HPI   Objective:  Temp 97.8 F (36.6 C)   Wt 111 lb 6.4 oz (50.5 kg)   SpO2 100%   BMI 18.54 kg/m  Body mass index is 18.54 kg/m. Physical Exam Vitals and nursing note reviewed.  Constitutional:      General: She is not in acute distress.    Appearance: Normal appearance. She is normal  weight. She is not ill-appearing or toxic-appearing.  Eyes:     Extraocular Movements: Extraocular movements intact.     Conjunctiva/sclera: Conjunctivae normal.     Pupils: Pupils are equal, round, and reactive to light.  Neurological:     Mental Status: She is alert and oriented to person, place,  and time. Mental status is at baseline.  Psychiatric:        Mood and Affect: Mood normal.        Behavior: Behavior normal.        Thought Content: Thought content normal.        Judgment: Judgment normal.     No results found. No results found. No results found for this or any previous visit (from the past 24 hour(s)).  Assessment/Plan: Kelsey [name] is a 76 y.o. female present for OV for  BPPV: With her history of recent C6 fracture and cervical stenosis, I would be hesitant to encourage her to have vestibular rehabilitation for her vertigo at this time.  Patient understands.  We discussed over-the-counter medications that can be used for vertigo, however her symptoms last a few seconds at a time, and are very sporadic, therefore she would likely not benefit as much from OTC meclizine. She is established with neurology as well, she could decide to discuss with them In the meantime encourage her to hydrate well.  Reviewed expectations re: course of current medical issues. Discussed self-management of symptoms. Outlined signs and symptoms indicating need for more acute intervention. Patient verbalized understanding and all questions were answered. Patient received an After-Visit Summary.    No orders of the defined types were placed in this encounter.  No orders of the defined types were placed in this encounter.  Referral Orders  No referral(s) requested today     Note is dictated utilizing voice recognition software. Although note has been proof read prior to signing, occasional typographical errors still can be missed. If any questions arise, please do not hesitate to  call for verification.   electronically signed by:  Howard Pouch, DO  [place]

## 2022-05-11 ENCOUNTER — Telehealth: Payer: Self-pay | Admitting: Family Medicine

## 2022-05-11 DIAGNOSIS — Z1231 Encounter for screening mammogram for malignant neoplasm of breast: Secondary | ICD-10-CM

## 2022-05-11 NOTE — Telephone Encounter (Signed)
Pt will have to cancel appt at GI

## 2022-05-11 NOTE — Telephone Encounter (Signed)
Ordered sent for [facility] [place]

## 2022-05-11 NOTE — Telephone Encounter (Signed)
Pt is asking that her referral order for mammogram be switched to [facility] [place]. She reports she contacted them and they have some availability for an earlier date.

## 2022-05-22 ENCOUNTER — Ambulatory Visit (INDEPENDENT_AMBULATORY_CARE_PROVIDER_SITE_OTHER): Payer: Medicare PPO | Admitting: Family Medicine

## 2022-05-22 ENCOUNTER — Ambulatory Visit (INDEPENDENT_AMBULATORY_CARE_PROVIDER_SITE_OTHER): Payer: Medicare PPO

## 2022-05-22 VITALS — BP 96/60 | HR 83 | Ht 65.0 in | Wt 109.0 lb

## 2022-05-22 DIAGNOSIS — M533 Sacrococcygeal disorders, not elsewhere classified: Secondary | ICD-10-CM

## 2022-05-22 DIAGNOSIS — M542 Cervicalgia: Secondary | ICD-10-CM

## 2022-05-22 DIAGNOSIS — M545 Low back pain, unspecified: Secondary | ICD-10-CM

## 2022-05-22 DIAGNOSIS — M9901 Segmental and somatic dysfunction of cervical region: Secondary | ICD-10-CM | POA: Diagnosis not present

## 2022-05-22 DIAGNOSIS — M47812 Spondylosis without myelopathy or radiculopathy, cervical region: Secondary | ICD-10-CM | POA: Diagnosis not present

## 2022-05-22 DIAGNOSIS — M9902 Segmental and somatic dysfunction of thoracic region: Secondary | ICD-10-CM

## 2022-05-22 DIAGNOSIS — M25552 Pain in left hip: Secondary | ICD-10-CM

## 2022-05-22 DIAGNOSIS — R42 Dizziness and giddiness: Secondary | ICD-10-CM | POA: Diagnosis not present

## 2022-05-22 DIAGNOSIS — S129XXD Fracture of neck, unspecified, subsequent encounter: Secondary | ICD-10-CM | POA: Diagnosis not present

## 2022-05-22 DIAGNOSIS — M9908 Segmental and somatic dysfunction of rib cage: Secondary | ICD-10-CM

## 2022-05-22 DIAGNOSIS — M47816 Spondylosis without myelopathy or radiculopathy, lumbar region: Secondary | ICD-10-CM | POA: Diagnosis not present

## 2022-05-22 DIAGNOSIS — M9904 Segmental and somatic dysfunction of sacral region: Secondary | ICD-10-CM | POA: Diagnosis not present

## 2022-05-22 DIAGNOSIS — M9903 Segmental and somatic dysfunction of lumbar region: Secondary | ICD-10-CM

## 2022-05-22 NOTE — Assessment & Plan Note (Signed)
Patient is now 4 months out from the fracture.  Still only did some mild soft tissue manipulation of the neck area.  Discussed with patient of seeing the neurosurgeon.  Discussed the potential for the fusion but I do not notice any significant weakness noted at this moment.  We discussed with patient about getting repeat x-rays to further evaluate to see if anything is potentially contributing or not.  Increase activity slowly.  Follow-up with me again in 6 to 8 weeks.

## 2022-05-22 NOTE — Assessment & Plan Note (Signed)
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  Discussed the possibility of an epidural again.  Patient would like to hold at this moment.  Patient would like to start increasing activity and see how she potentially does.  Discussed which activities to do and which ones to avoid, increase activity slowly.  Follow-up again in 6 to 8 weeks

## 2022-05-22 NOTE — Assessment & Plan Note (Signed)
Likely more vertigo, seems to be improving but will send patient to neuro rehab.

## 2022-05-22 NOTE — Progress Notes (Signed)
[facility] [place] Phone: [phone] Subjective:   Kelsey [name], am serving as [name] scribe for Dr. Hulan [name]. I'm seeing this patient by the request  of:  [name], [name], DO  CC: Back and neck pain follow-up  [id number]  Kelsey [name] is [name] 76 y.o. female coming in with complaint of back and neck pain. OMT 04/26/2022.  New problem: Patient states that she will be walking and L leg almost gives away due to sharp pain that occurs in L hip. Pain occurs with ascending and descending stairs and on level ground.   Saw PCP for vertigo who recommended for patient to see neurosurgery. Would like second opinion. Has been free from symptoms of vertigo for past 10 days. Has appt w neurosurgery on 12/8.  Notes that traction last visit was very helpful.   Medications patient has been prescribed: None  Taking:       Reviewed prior external information including notes and imaging from previsou exam, outside providers and external EMR if available.   As well as notes that were available from care everywhere and other healthcare systems.  Past medical history, social, surgical and family history all reviewed in electronic medical record.  [name] pertanent information unless stated regarding to the chief complaint.   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    Uterine fibroid 11/2012   See Dr. Cletis Media note, surgery 11/2012; calcified- multiple   Vaginitis 09/13/2011    Allergies  Allergen Reactions   Penicillins Rash    Has patient had [name] PCN reaction causing immediate rash, facial/tongue/throat swelling, SOB or lightheadedness with hypotension: [name] Has patient had [name] PCN reaction causing severe rash involving mucus membranes or skin necrosis: yes Has patient had [name] PCN reaction that required hospitalization: [name] Has patient had [name] PCN reaction occurring within the last 10 years: [name] If all of the above answers are "[name]", then may proceed with Cephalosporin use.    Sulfa Antibiotics Rash    Under arm and thighs     Review of Systems:  [name] headache, visual changes, nausea, vomiting, diarrhea, constipation, dizziness, abdominal pain, skin rash, fevers, chills, night sweats, weight loss, swollen lymph nodes, body aches, joint swelling, chest pain, shortness of breath, mood changes. POSITIVE muscle aches  Objective  Blood pressure 96/60, pulse 83, height '5\' 5"'$  (1.651 m), weight 109 lb (49.4 kg), SpO2 96 %.   General: [name] apparent distress alert and oriented x3 mood and affect normal, dressed appropriately.  HEENT: Pupils equal, extraocular movements intact  Respiratory: Patient's speak in full sentences and does not appear short of breath  Cardiovascular: [name] lower extremity edema, non tender, [name] erythema  Gait mildly guarded.  MSK:  Back does have some loss of lordosis.  Some tenderness to palpation noted.  Neck exam still has some limited sidebending to the right noted.  Neck exam has negative Spurling's.  Improvement in strength of the hands from previous exam.  More tenderness over the left hip  Osteopathic findings  C6 flexed rotated and side bent right T3 extended rotated and side bent right inhaled rib T9 extended rotated and side bent left L2 flexed rotated and side bent right Sacrum right on  right       Assessment and Plan:  Closed fracture of transverse process of cervical vertebra [facility]) Patient is now 4 months out from the fracture.  Still only did some mild soft tissue manipulation of the neck area.  Discussed with patient of seeing the neurosurgeon.  Discussed the potential for the fusion but I do not notice any significant weakness noted at this moment.  We discussed with patient about getting repeat x-rays to further evaluate to see if anything is potentially contributing or not.  Increase activity slowly.  Follow-up with me again in 6 to 8 weeks.  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  Discussed the possibility of an epidural again.  Patient would like to hold at this moment.  Patient would like to start increasing activity and see how she potentially does.  Discussed which activities to do and which ones to avoid, increase activity slowly.  Follow-up again in 6 to 8 weeks    Nonallopathic problems  Decision today to treat with OMT was based on Physical Exam  After verbal consent patient was treated with  ME, FPR techniques in cervical, rib, thoracic, lumbar, and sacral  areas avoided any HVLA and only did soft tissue on the neck again.  Patient tolerated the procedure well with improvement in symptoms  Patient given exercises, stretches and lifestyle modifications  See medications in patient instructions if given  Patient will follow up in 4-8 weeks     The above documentation has been reviewed and is accurate and complete Kelsey Pulley, DO         Note: This dictation was prepared with Dragon dictation along with smaller phrase technology. Any transcriptional errors that result from this process are unintentional.

## 2022-05-22 NOTE — Patient Instructions (Addendum)
Good to see you  Talked about vestibular neuro PT Stay active Follow up in 4-5 weeks

## 2022-06-11 ENCOUNTER — Ambulatory Visit (HOSPITAL_BASED_OUTPATIENT_CLINIC_OR_DEPARTMENT_OTHER)
Admission: RE | Admit: 2022-06-11 | Discharge: 2022-06-11 | Disposition: A | Discharge status: Home / Self Care | Payer: Medicare PPO | Source: Ambulatory Visit | Attending: Family Medicine | Admitting: Family Medicine

## 2022-06-11 ENCOUNTER — Encounter (HOSPITAL_BASED_OUTPATIENT_CLINIC_OR_DEPARTMENT_OTHER): Payer: Self-pay

## 2022-06-11 ENCOUNTER — Ambulatory Visit: Payer: Medicare PPO | Admitting: Family Medicine

## 2022-06-11 DIAGNOSIS — Z1231 Encounter for screening mammogram for malignant neoplasm of breast: Secondary | ICD-10-CM | POA: Diagnosis not present

## 2022-06-13 ENCOUNTER — Ambulatory Visit (INDEPENDENT_AMBULATORY_CARE_PROVIDER_SITE_OTHER): Payer: Medicare PPO | Admitting: Medical

## 2022-06-13 VITALS — BP 112/70 | HR 80 | Temp 97.3°F | Ht 65.0 in | Wt 111.0 lb

## 2022-06-13 DIAGNOSIS — S61531A Puncture wound without foreign body of right wrist, initial encounter: Secondary | ICD-10-CM | POA: Diagnosis not present

## 2022-06-13 DIAGNOSIS — W540XXA Bitten by dog, initial encounter: Secondary | ICD-10-CM

## 2022-06-13 MED ORDER — DOXYCYCLINE HYCLATE 100 MG PO TABS: 100.0000 mg | ORAL_TABLET | Freq: Two times a day (BID) | ORAL | 0 refills | Status: DC

## 2022-06-13 NOTE — Patient Instructions (Addendum)
Dog bite dorsal aspect of wrist. Mild bite. Does not appear to be deep puncture wound. Good range of motion. No dc presently but scab present. Rx doxycycline antibiotic for 7 days for infection prevention. Rx advisement given. Allergic to pcn and sulfa.   Keep dog in house. Since up to date on rabies not concern. If  your dog begins to exhibit abnormal behavior notify us and would contract animal control. I think your bite was provoked.  Follow up 10 days or sooner if needed.

## 2022-06-13 NOTE — Progress Notes (Signed)
   Subjective:    Patient ID: Kelsey [name], female    DOB: [birth year], 76 y.o.   MRN: [id number]  HPI   Pt states her dog bit her/nipped her about 18 hours ago. Pt dog is 45 year old. Up to date on rabies vaccine. Dog is in fence at patient home.  Pt does not want me to notify animal control.   Pt tried to pull pork from dogs mouth. Dog jumped up on counter and got pork while she was cooking.         Review of Systems  Constitutional:  Negative for chills, fatigue and fever.  Respiratory:  Negative for cough, choking, shortness of breath and wheezing.   Cardiovascular:  Negative for chest pain and palpitations.  Musculoskeletal:        See hpi.       Objective:   Physical Exam General- No acute distress. Pleasant patient. Lungs- Clear, even and unlabored. Heart- regular rate and rhythm. Neurologic- CNII- XII grossly intact.   Rt wrist- back of wrist small scab present. No dc. Good range of motion. No rednes or tracking.     Assessment & Plan:   Patient Instructions  Dog bite dorsal aspect of wrist. Mild bite. Does not appear to be deep puncture wound. Good range of motion. No dc presently but scab present. Rx doxycycline antibiotic for 7 days for infection prevention. Rx advisement given. Allergic to pcn and sulfa.   Keep dog in house. Since up to date on rabies not concern. If  your dog begins to exhibit abnormal behavior notify us and would contract animal control. I think your bite was provoked.  Follow up 10 days or sooner if needed.   Mackie Pai, PA-C

## 2022-06-15 DIAGNOSIS — M47812 Spondylosis without myelopathy or radiculopathy, cervical region: Secondary | ICD-10-CM | POA: Diagnosis not present

## 2022-06-15 DIAGNOSIS — Z681 Body mass index (BMI) 19 or less, adult: Secondary | ICD-10-CM | POA: Diagnosis not present

## 2022-06-15 DIAGNOSIS — M4802 Spinal stenosis, cervical region: Secondary | ICD-10-CM | POA: Diagnosis not present

## 2022-06-19 NOTE — Progress Notes (Unsigned)
[facility] Phone: [phone] Subjective:    I'm seeing this patient by the request  of:  [name], [name], DO  CC: back and neck pain   [id number]  Kelsey [name] is a 76 y.o. female coming in with complaint of back and neck pain. OMT 05/22/2022.  Patient continues to have some difficulty with certain range of motion.  In the morning very stiff until she can get moving in the morning.  Since we have seen patient with reviewing the chart patient was not bitten by her dog.  Likely not having any type of infection she states this time.  Was bitten by her cat earlier in the year.    Medications patient has been prescribed: None  Taking:         Reviewed prior external information including notes and imaging from previsou exam, outside providers and external EMR if available.   As well as notes that were available from care everywhere and other healthcare systems.  Past medical history, social, surgical and family history all reviewed in electronic medical record.  No pertanent information unless stated regarding to the chief complaint.   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    Uterine fibroid 11/2012   See Dr. Cletis Media note, surgery 11/2012; calcified- multiple   Vaginitis 09/13/2011     Allergies  Allergen Reactions   Penicillins Rash    Has patient had a PCN reaction causing immediate rash, facial/tongue/throat swelling, SOB or lightheadedness with hypotension: no Has patient had a PCN reaction causing severe rash involving mucus membranes or skin necrosis: yes Has patient had a PCN reaction that required hospitalization: no Has patient had a PCN reaction occurring within the last 10 years: no If all of the above answers are "NO", then may proceed with Cephalosporin use.    Sulfa Antibiotics Rash    Under arm and thighs     Review of Systems:  No headache, visual changes, nausea, vomiting, diarrhea, constipation, dizziness, abdominal pain, skin rash, fevers, chills, night sweats, weight loss, swollen lymph nodes, body aches, joint swelling, chest pain, shortness of breath, mood changes. POSITIVE muscle aches  Objective  Blood pressure 98/60, pulse 78, height '5\' 5"'$  (1.651 m), weight 107 lb (48.5 kg).   General: No apparent distress alert and oriented x3 mood and affect normal, dressed appropriately.  HEENT: Pupils equal, extraocular movements intact  Respiratory: Patient's speak in full sentences and does not appear short of breath  Cardiovascular: No lower extremity edema, non tender, no erythema  Low back exam does have some loss lordosis.  Some tightness noted in the paraspinal musculature.  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  We discussed icing regimen and home exercises, discussed which activities to do and which ones to avoid.  Discussed icing regimen and home exercises still.  Patient is trying to  avoid too many medications.  Follow-up again in 6 to 8 weeks    Nonallopathic problems  Decision today to treat with OMT was based on Physical Exam  After verbal consent patient was treated with HVLA, ME, FPR techniques in cervical, rib, thoracic, lumbar, and sacral  areas  Patient tolerated the procedure well with improvement in symptoms  Patient given exercises, stretches and lifestyle modifications  See medications in patient instructions if given  Patient will follow up in 4-8 weeks     The above documentation has been reviewed and is accurate and complete Lyndal Pulley, DO         Note: This dictation was prepared with Dragon dictation along with smaller phrase technology. Any transcriptional errors that result from this process are unintentional.

## 2022-06-21 ENCOUNTER — Ambulatory Visit (INDEPENDENT_AMBULATORY_CARE_PROVIDER_SITE_OTHER): Payer: Medicare PPO | Admitting: Family Medicine

## 2022-06-21 ENCOUNTER — Encounter: Payer: Self-pay | Admitting: Family Medicine

## 2022-06-21 VITALS — BP 98/60 | HR 78 | Ht 65.0 in | Wt 107.0 lb

## 2022-06-21 DIAGNOSIS — M9904 Segmental and somatic dysfunction of sacral region: Secondary | ICD-10-CM

## 2022-06-21 DIAGNOSIS — M533 Sacrococcygeal disorders, not elsewhere classified: Secondary | ICD-10-CM

## 2022-06-21 DIAGNOSIS — M9901 Segmental and somatic dysfunction of cervical region: Secondary | ICD-10-CM

## 2022-06-21 DIAGNOSIS — M9903 Segmental and somatic dysfunction of lumbar region: Secondary | ICD-10-CM | POA: Diagnosis not present

## 2022-06-21 DIAGNOSIS — M9902 Segmental and somatic dysfunction of thoracic region: Secondary | ICD-10-CM | POA: Diagnosis not present

## 2022-06-21 DIAGNOSIS — M9908 Segmental and somatic dysfunction of rib cage: Secondary | ICD-10-CM | POA: Diagnosis not present

## 2022-06-21 NOTE — Patient Instructions (Signed)
Good to see you  Oofos or [place] recovery sandals  See me again in 6-8 weeks

## 2022-06-21 NOTE — Assessment & Plan Note (Signed)
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  We discussed icing regimen and home exercises, discussed which activities to do and which ones to avoid.  Discussed icing regimen and home exercises still.  Patient is trying to avoid too many medications.  Follow-up again in 6 to 8 weeks

## 2022-07-06 ENCOUNTER — Ambulatory Visit: Payer: Medicare PPO

## 2022-07-24 NOTE — Progress Notes (Signed)
[name] [place] Phone: [phone] Subjective:   Kelsey [name], am serving as a scribe for Dr. [name].  I'm seeing this patient by the request  of:  [name], [name], DO  CC: back and neck pain follow up   [id number]  Kelsey [name] is a 77 y.o. female coming in with complaint of back and neck pain. OMT 06/21/2022. Patient states here for routine OMT but her back is very tight, low back especially. Patient wants to know her condition she she has to tell the acupuncture parson when she goes to them. Patient would also like to go over her x ray results she had in November. States she forgot to ask at her last appointment.   Medications patient has been prescribed: None  Taking:         Reviewed prior external information including notes and imaging from previsou exam, outside providers and external EMR if available.   As well as notes that were available from care everywhere and other healthcare systems.  Past medical history, social, surgical and family history all reviewed in electronic medical record.  No pertanent information unless stated regarding to the chief complaint.   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    Uterine fibroid 11/2012   See Dr. Cletis Media note, surgery 11/2012;  calcified- multiple   Vaginitis 09/13/2011    Allergies  Allergen Reactions   Penicillins Rash    Has patient had a PCN reaction causing immediate rash, facial/tongue/throat swelling, SOB or lightheadedness with hypotension: no Has patient had a PCN reaction causing severe rash involving mucus membranes or skin necrosis: yes Has patient had a PCN reaction that required hospitalization: no Has patient had a PCN reaction occurring within the last 10 years: no If all of the above answers are "NO", then may proceed with Cephalosporin use.    Sulfa Antibiotics Rash    Under arm and thighs     Review of Systems:  No headache, visual changes, nausea, vomiting, diarrhea, constipation, dizziness, abdominal pain, skin rash, fevers, chills, night sweats, weight loss, swollen lymph nodes, body aches, joint swelling, chest pain, shortness of breath, mood changes. POSITIVE muscle aches  Objective  Blood pressure 90/60, pulse 100, height '5\' 5"'$  (1.651 m), SpO2 95 %.   General: No apparent distress alert and oriented x3 mood and affect normal, dressed appropriately.  HEENT: Pupils equal, extraocular movements intact  Respiratory: Patient's speak in full sentences and does not appear short of breath  Cardiovascular: No lower extremity edema, non tender, no erythema  MSK:  Back back does have significant loss of lordosis.  Some tenderness to palpation in the paraspinal musculature.  Patient does have limited  range of motion in all planes.  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  Patient does have difficulty with sidebending bilaterally.  Tightness with FABER test right greater than left.   We discussed with patient if this continues to worsen can consider potential injections.  Patient still not taking as many medications.  Follow-up with me again 6 to 8 weeks  Closed fracture of transverse process of cervical vertebra (HCC) On previous x-rays seem to be doing relatively well, no longer having as much radicular symptoms.  Still deferred osteopathic manipulation.  Discussed the pros and cons of potential fusion but seems to be doing relatively well.  Can follow-up with me again in 6 weeks.  Total time discussing with patient 33 minutes    Nonallopathic problems  Decision today to treat with OMT was based on Physical Exam  After verbal consent patient was treated with HVLA, ME, FPR techniques in  rib, thoracic, lumbar, and sacral  areas  Patient tolerated the procedure well with improvement in symptoms  Patient given exercises, stretches and lifestyle modifications  See medications in patient instructions if given  Patient will follow up in 4-8 weeks    The above documentation has been reviewed and is accurate and complete Lyndal Pulley, DO          Note: This dictation was prepared with Dragon dictation along with smaller phrase technology. Any transcriptional errors that result from this process are unintentional.

## 2022-07-26 ENCOUNTER — Ambulatory Visit (INDEPENDENT_AMBULATORY_CARE_PROVIDER_SITE_OTHER): Payer: Medicare PPO | Admitting: Family Medicine

## 2022-07-26 VITALS — BP 90/60 | HR 100 | Ht 65.0 in

## 2022-07-26 DIAGNOSIS — M9908 Segmental and somatic dysfunction of rib cage: Secondary | ICD-10-CM | POA: Diagnosis not present

## 2022-07-26 DIAGNOSIS — S129XXD Fracture of neck, unspecified, subsequent encounter: Secondary | ICD-10-CM | POA: Diagnosis not present

## 2022-07-26 DIAGNOSIS — M9903 Segmental and somatic dysfunction of lumbar region: Secondary | ICD-10-CM

## 2022-07-26 DIAGNOSIS — M48061 Spinal stenosis, lumbar region without neurogenic claudication: Secondary | ICD-10-CM | POA: Diagnosis not present

## 2022-07-26 DIAGNOSIS — M9904 Segmental and somatic dysfunction of sacral region: Secondary | ICD-10-CM | POA: Diagnosis not present

## 2022-07-26 DIAGNOSIS — M9902 Segmental and somatic dysfunction of thoracic region: Secondary | ICD-10-CM | POA: Diagnosis not present

## 2022-07-26 NOTE — Patient Instructions (Addendum)
Good to see you  I think the neck is getting better and better Follow up  6 weeks

## 2022-07-26 NOTE — Assessment & Plan Note (Signed)
Degenerative lumbar spinal stenosis noted.  Patient does have difficulty with sidebending bilaterally.  Tightness with FABER test right greater than left.  We discussed with patient if this continues to worsen can consider potential injections.  Patient still not taking as many medications.  Follow-up with me again 6 to 8 weeks

## 2022-07-26 NOTE — Assessment & Plan Note (Addendum)
On previous x-rays seem to be doing relatively well, no longer having as much radicular symptoms.  Still deferred osteopathic manipulation.  Discussed the pros and cons of potential fusion but seems to be doing relatively well.  Can follow-up with me again in 6 weeks.  Total time discussing with patient 33 minutes and this does not include procedure.

## 2022-08-23 ENCOUNTER — Ambulatory Visit (INDEPENDENT_AMBULATORY_CARE_PROVIDER_SITE_OTHER): Payer: Medicare PPO | Admitting: Family Medicine

## 2022-08-23 ENCOUNTER — Encounter: Payer: Self-pay | Admitting: Family Medicine

## 2022-08-23 VITALS — BP 96/68 | HR 77 | Ht 65.0 in | Wt 113.0 lb

## 2022-08-23 DIAGNOSIS — M9902 Segmental and somatic dysfunction of thoracic region: Secondary | ICD-10-CM

## 2022-08-23 DIAGNOSIS — M9901 Segmental and somatic dysfunction of cervical region: Secondary | ICD-10-CM

## 2022-08-23 DIAGNOSIS — M9903 Segmental and somatic dysfunction of lumbar region: Secondary | ICD-10-CM

## 2022-08-23 DIAGNOSIS — M9908 Segmental and somatic dysfunction of rib cage: Secondary | ICD-10-CM | POA: Diagnosis not present

## 2022-08-23 DIAGNOSIS — M9904 Segmental and somatic dysfunction of sacral region: Secondary | ICD-10-CM

## 2022-08-23 DIAGNOSIS — M9905 Segmental and somatic dysfunction of pelvic region: Secondary | ICD-10-CM | POA: Diagnosis not present

## 2022-08-23 DIAGNOSIS — M48061 Spinal stenosis, lumbar region without neurogenic claudication: Secondary | ICD-10-CM

## 2022-08-23 NOTE — Patient Instructions (Signed)
Great to see you You are doing terrific See me in 5-6 weeks

## 2022-08-23 NOTE — Assessment & Plan Note (Signed)
Known degenerative spinal stenosis.  We discussed with patient again at great length.  We discussed that we can continue with the osteopathic manipulation or consider other epidurals again.  Patient feels like she is doing relatively well overall.  Continuing to stay active.  Known arthritis also the sacroiliac joint that we will continue to monitor.  Follow-up with me again in 6 to 8 weeks for further evaluation.

## 2022-08-23 NOTE — Progress Notes (Signed)
[place] Phone: [phone] Subjective:   [name], am serving as a scribe for Dr. [name].  I'm seeing this patient by the request  of:  [name], [name], DO  CC: Low back pain follow-up  [id number]  [name] is a 77 y.o. female coming in with complaint of back and neck pain. OMT on 07/26/2022. Patient states that she continues to have pain in lumbar spine. Tries to do cat camel daily.  Patient states continues to have some back pain noted.  Nothing that stopping her from activity but can still be irritated.  Neck exam is doing well.  Had the fracture but states that having no radicular pain and nothing that is stopping her from any activities.  Happy that she decided on more the conservative therapy at this time.  Medications patient has been prescribed:   Taking:         Reviewed prior external information including notes and imaging from previsou exam, outside providers and external EMR if available.   As well as notes that were available from care everywhere and other healthcare systems.  Past medical history, social, surgical and family history all reviewed in electronic medical record.  No pertanent information unless stated regarding to the chief complaint.   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    Uterine fibroid 11/2012   See Dr. Cletis Media note, surgery 11/2012; calcified- multiple   Vaginitis 09/13/2011    Allergies  Allergen Reactions   Penicillins Rash    Has patient had a PCN reaction causing immediate rash, facial/tongue/throat swelling, SOB or lightheadedness with hypotension: no Has patient had a PCN reaction causing severe rash involving mucus membranes or skin necrosis: yes Has patient had a PCN reaction that required hospitalization: no Has patient had a PCN reaction occurring within the last 10 years: no If all of the above answers are "NO", then may proceed with Cephalosporin use.    Sulfa Antibiotics Rash    Under arm and thighs     Review of Systems:  No headache, visual changes, nausea, vomiting, diarrhea, constipation, dizziness, abdominal pain, skin rash, fevers, chills, night sweats, weight loss, swollen lymph nodes, body aches, joint swelling, chest pain, shortness of breath, mood changes. POSITIVE muscle aches  Objective  Blood pressure 96/68, pulse 77, height 5' 5"$  (1.651 m), weight 113 lb (51.3 kg), SpO2 97 %.   General: No apparent distress alert and oriented x3 mood and affect normal, dressed appropriately.  HEENT: Pupils equal, extraocular movements intact  Respiratory: Patient's speak in full sentences and does not appear short of breath  Cardiovascular: No lower extremity edema, non tender, no erythema  Low back  still has significant loss of lordosis.  Some tightness noted with sidebending bilaterally.  Limited extension of the back noted today.  Tightness with FABER test right greater than left.  Neck exam still has some limited sidebending bilaterally.  Osteopathic findings  C2 flexed rotated and side bent right C8 flexed rotated and side bent left T3 extended rotated and side bent right inhaled rib T8 extended rotated and side bent left L2 flexed rotated and side bent right L5 flexed rotated and side bent  left Sacrum right on right       Assessment and Plan:  Degenerative lumbar spinal stenosis Known degenerative spinal stenosis.  We discussed with patient again at great length.  We discussed that we can continue with the osteopathic manipulation or consider other epidurals again.  Patient feels like she is doing relatively well overall.  Continuing to stay active.  Known arthritis also the sacroiliac joint that we will continue to monitor.  Follow-up with me again in 6 to 8 weeks for further evaluation.    Nonallopathic problems  Decision today to treat with OMT was based on Physical Exam  After verbal consent patient was treated with HVLA, ME, FPR techniques in cervical, rib, thoracic, lumbar, and sacral  areas  Patient tolerated the procedure well with improvement in symptoms  Patient given exercises, stretches and lifestyle modifications  See medications in patient instructions if given  Patient will follow up in 4-8 weeks     The above documentation has been reviewed and is accurate and complete Lyndal Pulley, DO         Note: This dictation was prepared with Dragon dictation along with smaller phrase technology. Any transcriptional errors that result from this process are unintentional.

## 2022-08-29 IMAGING — MG DIGITAL SCREENING BILAT W/ TOMO W/ CAD
8 series · 9 of 24 positions shown · non-contrast
Comparison: Previous exam(s).

CLINICAL DATA: Screening.

EXAM:
DIGITAL SCREENING BILATERAL MAMMOGRAM WITH TOMO AND CAD

[L MLO synth-2D]
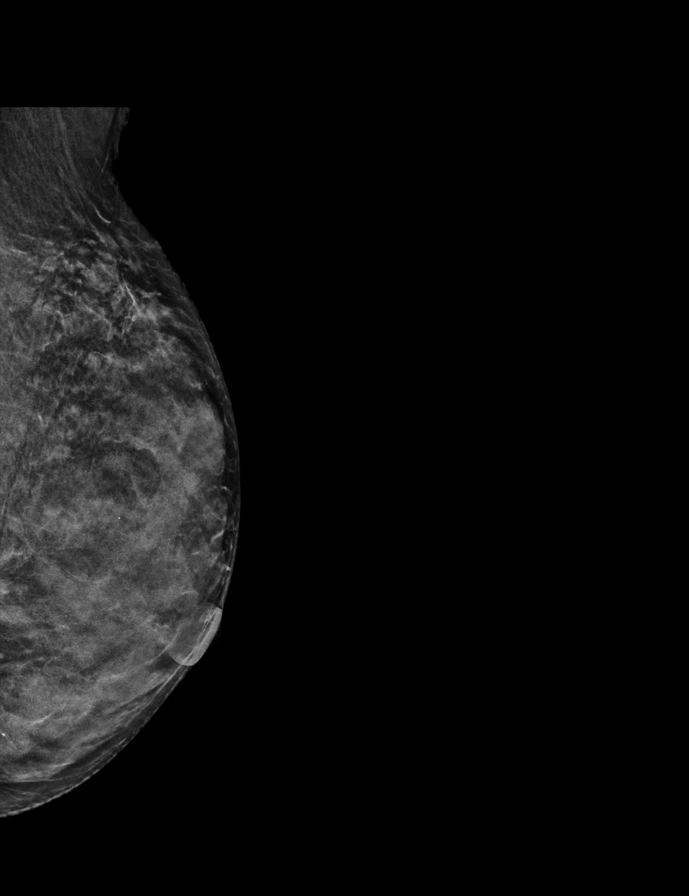

[R CC synth-2D]
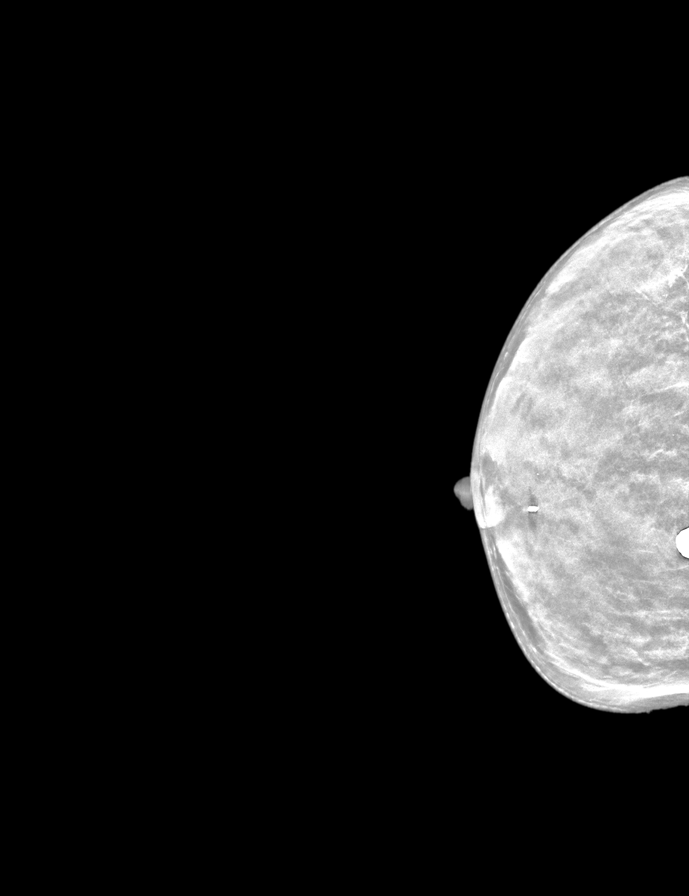

[L CC synth-2D]
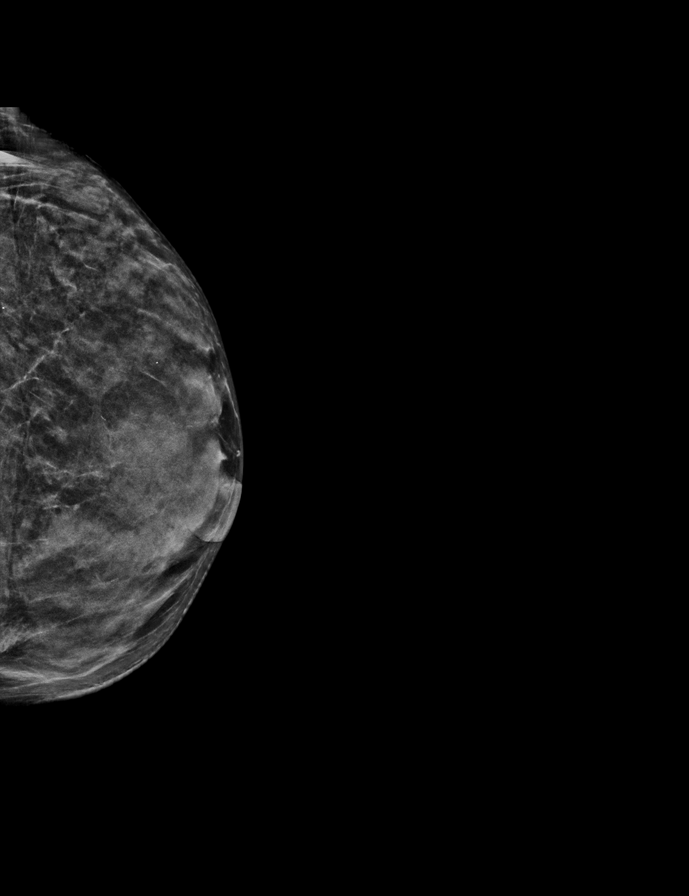

[R MLO synth-2D]
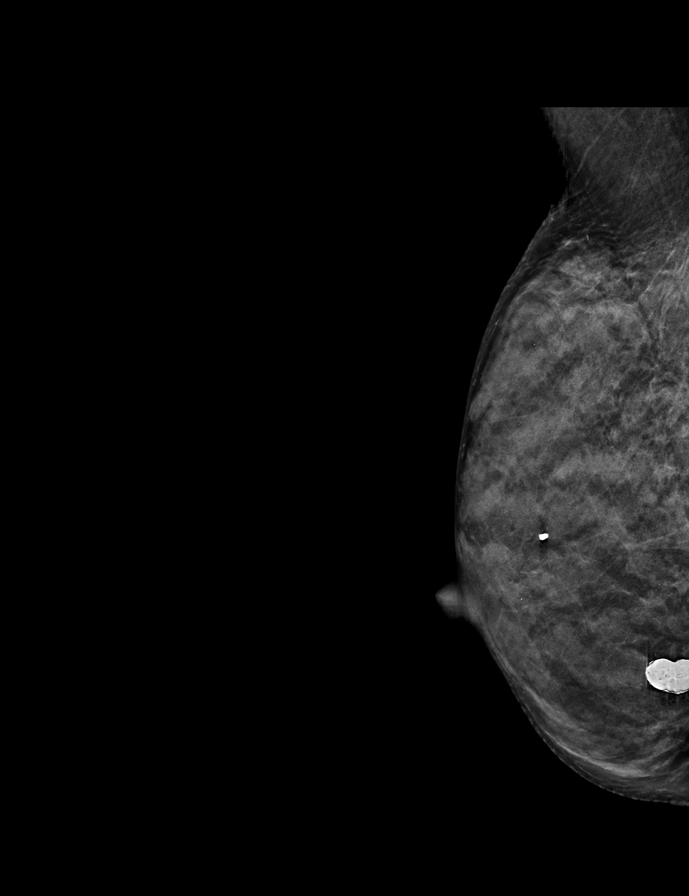

[L MLO tomo · 2 of 41 frames shown]
[frame 14/41]
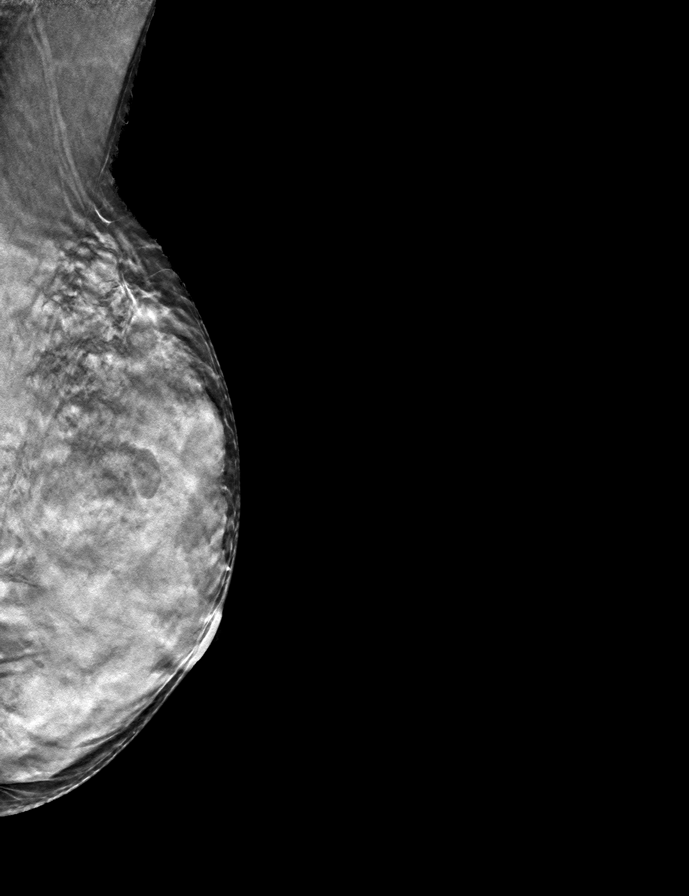
[frame 21/41]
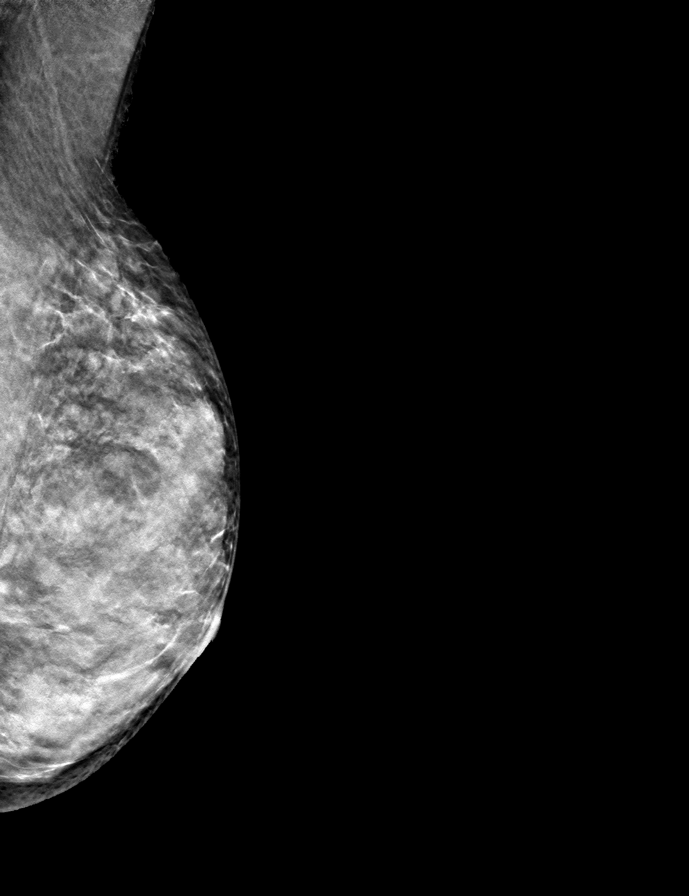

[R CC tomo · tomo slice 21/42.0]
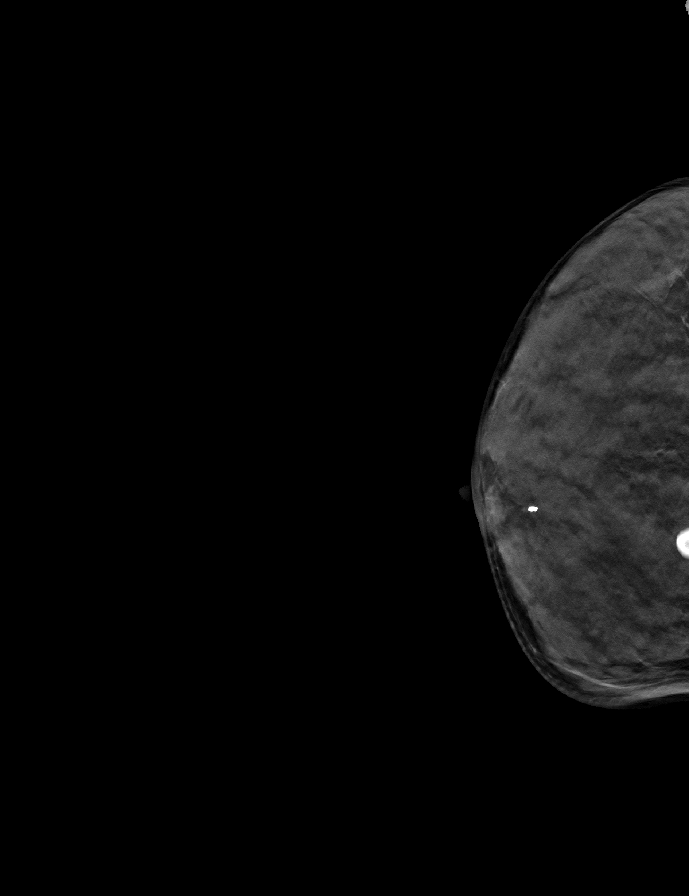

[R MLO tomo · tomo slice 21/40.0]
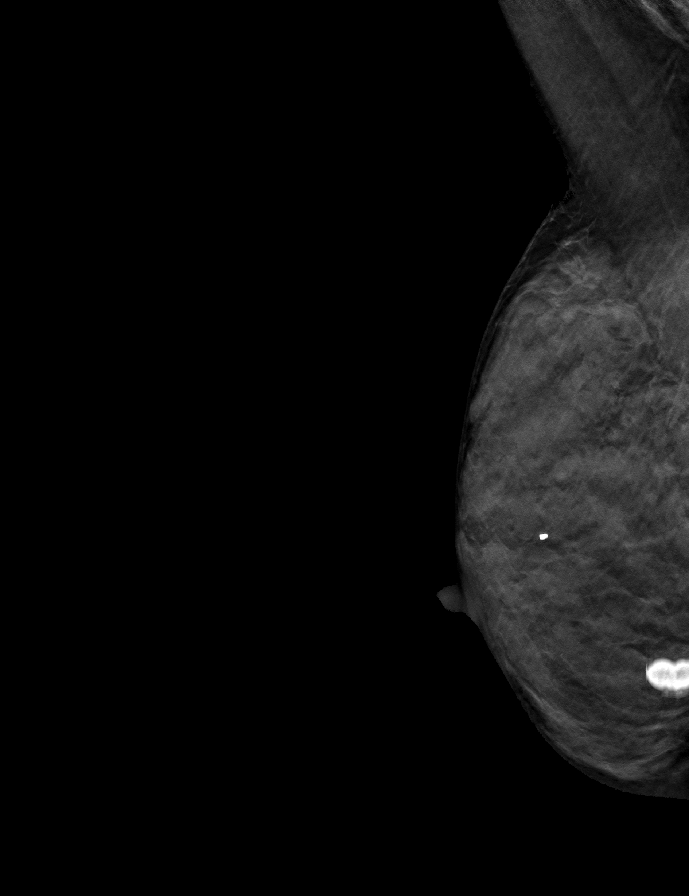

[L CC tomo · tomo slice 25/49.0]
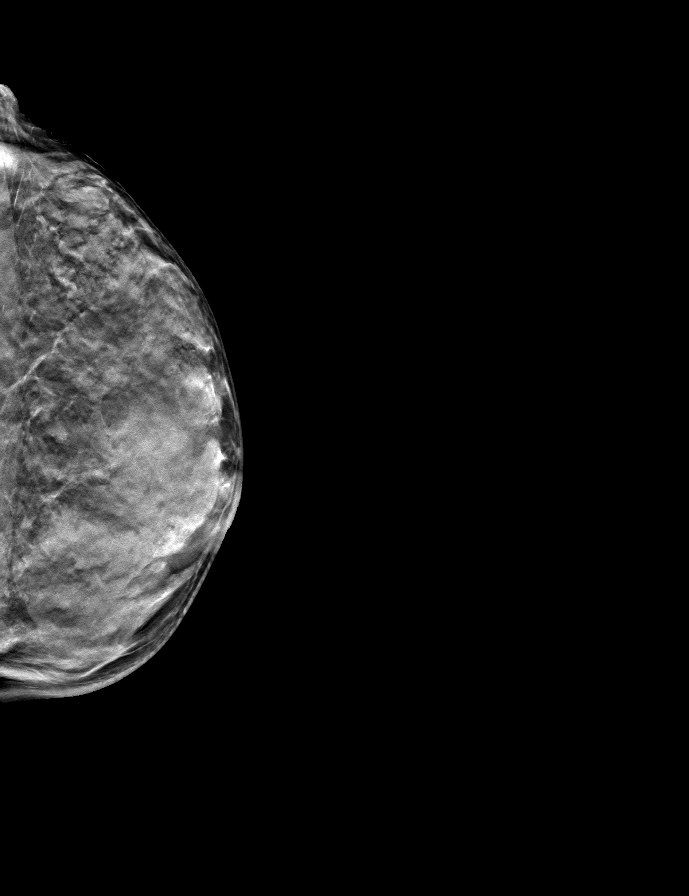

[9 of 24 positions shown; findings below may reference images not displayed]

ACR Breast Density Category d: The breast tissue is extremely dense,
which lowers the sensitivity of mammography
FINDINGS: There are no findings suspicious for malignancy. Images were
processed with CAD.
IMPRESSION: No mammographic evidence of malignancy. A result letter of this
screening mammogram will be mailed directly to the patient.

RECOMMENDATION:
Screening mammogram in one year. (Code:WO-[id number])

BI-RADS CATEGORY  1: Negative.

## 2022-09-05 ENCOUNTER — Other Ambulatory Visit: Payer: Self-pay | Admitting: Family Medicine

## 2022-09-10 ENCOUNTER — Telehealth: Payer: Self-pay | Admitting: Family Medicine

## 2022-09-10 NOTE — Telephone Encounter (Signed)
Copied from [place] [phone]. Topic: Medicare AWV >> Sep 10, 2022 11:28 AM Gillis Santa wrote: Reason for CRM: Called patient to schedule Medicare Annual Wellness Visit (AWV). Left message for patient to call back and schedule Medicare Annual Wellness Visit (AWV).  Last date of AWV: 09/06/2021  Please schedule an appointment at any time with Otila Kluver, [facility].  If any questions, please contact me at [phone].  Thank you ,  Shaune Pollack [facility] AWV TEAM Direct Dial [phone]

## 2022-09-19 ENCOUNTER — Telehealth: Payer: Self-pay | Admitting: Family Medicine

## 2022-09-19 NOTE — Telephone Encounter (Signed)
Copied from [place] [phone]. Topic: Medicare AWV >> Sep 19, 2022  9:22 AM Gillis Santa wrote: Reason for CRM: Called patient to schedule Medicare Annual Wellness Visit (AWV). Left message for patient to call back and schedule Medicare Annual Wellness Visit (AWV).  Last date of AWV: 09/06/2021  Please schedule an appointment at any time with Otila Kluver,  [facility].  Please schedule AWVS with [facility] health coach, Otila Kluver for a tele visit on a Wednesday only.  If any questions, please contact me at [phone].  Thank you ,  Shaune Pollack [facility] AWV TEAM Direct Dial [phone]

## 2022-09-20 NOTE — Progress Notes (Signed)
[place] Phone: [phone] Subjective:   [name], am serving as a scribe for Dr. [name].  I'm seeing this patient by the request  of:  [name], [name], DO  CC: neck and low back pain follow up   [id number]  Kelsey [name] is a 77 y.o. female coming in with complaint of back and neck pain. OMT 08/23/2022. Patient states that she had tingling in R arm when driving but this has subsided. Patient states that she is stiff today in lumbar spine.   Medications patient has been prescribed: None  Taking:         Reviewed prior external information including notes and imaging from previsou exam, outside providers and external EMR if available.   As well as notes that were available from care everywhere and other healthcare systems.  Past medical history, social, surgical and family history all reviewed in electronic medical record.  No pertanent information unless stated regarding to the chief complaint.   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    Uterine fibroid 11/2012   See Dr. Cletis Media note, surgery 11/2012; calcified- multiple   Vaginitis 09/13/2011    Allergies  Allergen Reactions   Penicillins Rash    Has patient had a PCN reaction causing immediate rash,  facial/tongue/throat swelling, SOB or lightheadedness with hypotension: no Has patient had a PCN reaction causing severe rash involving mucus membranes or skin necrosis: yes Has patient had a PCN reaction that required hospitalization: no Has patient had a PCN reaction occurring within the last 10 years: no If all of the above answers are "NO", then may proceed with Cephalosporin use.    Sulfa Antibiotics Rash    Under arm and thighs     Review of Systems:  No headache, visual changes, nausea, vomiting, diarrhea, constipation, dizziness, abdominal pain, skin rash, fevers, chills, night sweats, weight loss, swollen lymph nodes, body aches, joint swelling, chest pain, shortness of breath, mood changes. POSITIVE muscle aches  Objective  Blood pressure 90/62, pulse 77, height 5\' 5"  (1.651 m), weight 111 lb (50.3 kg), SpO2 98 %.   General: No apparent distress alert and oriented x3 mood and affect normal, dressed appropriately.  HEENT: Pupils equal, extraocular movements intact  Respiratory: Patient's speak in full sentences and does not appear short of breath  Cardiovascular: No lower extremity edema, non tender, no erythema  Significant loss of lordosis of the back. Tightness with FABER as well  Neck does have loss of lordosis   Osteopathic findings  T3 extended rotated and side bent right inhaled rib T8 extended rotated and side bent right L2 flexed rotated and side bent right L5 F RS left  Sacrum right on right    Assessment and Plan:  Degenerative lumbar spinal stenosis Discussed HEP  Discussed which activities to do and which ones to avoid. If severe pain noted will consider which activities such as epidural. RTC in 6-8 weeks    Closed fracture of transverse process of cervical vertebra ([place]) Deferred any type of manipulation again and did not just some very mild muscle energy.  Will continue to monitor.  Patient is going to avoid surgical intervention and seems to be  doing relatively well.    Nonallopathic problems  Decision today to treat with OMT was based on Physical Exam  After verbal consent patient was treated with HVLA, ME, FPR techniques in  rib, thoracic, lumbar, and sacral  areas  Patient tolerated the procedure well with improvement in symptoms  Patient given exercises, stretches and lifestyle modifications  See medications in patient instructions if given  Patient will follow up in 4-8 weeks    The above documentation has been reviewed and is accurate and complete Lyndal Pulley, DO          Note: This dictation was prepared with Dragon dictation along with smaller phrase technology. Any transcriptional errors that result from this process are unintentional.

## 2022-09-24 ENCOUNTER — Encounter: Payer: Self-pay | Admitting: Family Medicine

## 2022-09-24 ENCOUNTER — Ambulatory Visit (INDEPENDENT_AMBULATORY_CARE_PROVIDER_SITE_OTHER): Payer: Medicare PPO | Admitting: Family Medicine

## 2022-09-24 VITALS — BP 90/62 | HR 77 | Ht 65.0 in | Wt 111.0 lb

## 2022-09-24 DIAGNOSIS — S129XXD Fracture of neck, unspecified, subsequent encounter: Secondary | ICD-10-CM | POA: Diagnosis not present

## 2022-09-24 DIAGNOSIS — M9904 Segmental and somatic dysfunction of sacral region: Secondary | ICD-10-CM | POA: Diagnosis not present

## 2022-09-24 DIAGNOSIS — M48061 Spinal stenosis, lumbar region without neurogenic claudication: Secondary | ICD-10-CM

## 2022-09-24 DIAGNOSIS — M9903 Segmental and somatic dysfunction of lumbar region: Secondary | ICD-10-CM

## 2022-09-24 DIAGNOSIS — M9902 Segmental and somatic dysfunction of thoracic region: Secondary | ICD-10-CM

## 2022-09-24 NOTE — Assessment & Plan Note (Signed)
Deferred any type of manipulation again and did not just some very mild muscle energy.  Will continue to monitor.  Patient is going to avoid surgical intervention and seems to be doing relatively well.

## 2022-09-24 NOTE — Patient Instructions (Signed)
See me again 5-6 weeks Sauna helped

## 2022-09-24 NOTE — Assessment & Plan Note (Signed)
Discussed HEP  Discussed which activities to do and which ones to avoid. If severe pain noted will consider which activities such as epidural. RTC in 6-8 weeks

## 2022-10-03 ENCOUNTER — Ambulatory Visit (INDEPENDENT_AMBULATORY_CARE_PROVIDER_SITE_OTHER): Payer: Medicare PPO

## 2022-10-03 VITALS — Wt 111.0 lb

## 2022-10-03 DIAGNOSIS — Z0000 Encounter for general adult medical examination without abnormal findings: Secondary | ICD-10-CM | POA: Diagnosis not present

## 2022-10-03 NOTE — Progress Notes (Signed)
I connected with  Kelsey [name] on 10/03/22 by a audio enabled telemedicine application and verified that I am speaking with the correct person using two identifiers.  Patient Location: Home  Provider Location: Home Office  I discussed the limitations of evaluation and management by telemedicine. The patient expressed understanding and agreed to proceed.   Subjective:   Kelsey [name] is a 77 y.o. female who presents for Medicare Annual (Subsequent) preventive examination.  Review of Systems     Cardiac Risk Factors include: advanced age (>28men, >60 women);dyslipidemia     Objective:    Today's Vitals   10/03/22 0901  Weight: 111 lb (50.3 kg)   Body mass index is 18.47 kg/m.     10/03/2022    9:06 AM 09/06/2021   12:00 PM 03/30/2021   11:23 PM 01/23/2021   10:41 AM 08/17/2020    9:49 AM 06/07/2020   10:38 AM 04/07/2018    3:16 PM  Advanced Directives  Does Patient Have a Medical Advance Directive? Yes Yes No Yes Yes Yes Yes  Type of Paramedic of [place];Living will Healthcare Power of [place];Living will;Out of facility DNR (pink MOST or yellow form) [place];Living will Living will [place];Living will  Does patient want to make changes to medical advance directive? No - Patient declined        Copy of [place] in Chart? Yes - validated most recent copy scanned in chart (See row information) Yes - validated most recent copy scanned in chart (See row information)   Yes - validated most recent copy scanned in chart (See row information)  Yes  Would patient like information on creating a medical advance directive?   No - Patient declined        Current Medications (verified) Outpatient Encounter Medications as of 10/03/2022  Medication Sig   atorvastatin (LIPITOR) 10 MG tablet Take 1 tablet (10 mg total) by mouth every evening.   omeprazole (PRILOSEC) 40  MG capsule TAKE 1 CAPSULE DAILY   Vitamin D, Ergocalciferol, (DRISDOL) 1.25 MG (50000 UNIT) CAPS capsule Take 1 capsule (50,000 Units total) by mouth every 7 (seven) days.   No facility-administered encounter medications on file as of 10/03/2022.    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    Uterine fibroid 11/2012   See Dr. Cletis Media note, surgery 11/2012; calcified- multiple   Vaginitis 09/13/2011   Past Surgical History:  Procedure Laterality Date   DILATATION & CURRETTAGE/HYSTEROSCOPY WITH RESECTOCOPE N/A 11/06/2012   Procedure: [place];  Surgeon: Alwyn Pea, MD;  Location: [facility];  Service: Gynecology;  Laterality: N/A;   HERNIA REPAIR  1992   I & D EXTREMITY Left 03/08/2021   Procedure: IRRIGATION AND DEBRIDEMENT OF THUMB AND ARM;  Surgeon: Leanora Cover, MD;  Location: [place];  Service: Orthopedics;  Laterality: Left;   small intestine hernia  1994   WISDOM TOOTH EXTRACTION     Family History  Problem Relation Age of Onset   Heart disease Mother    Osteoporosis Mother    Cancer Father        prostate   Osteoporosis Sister    Gout Brother    Gout Maternal Grandmother    Heart disease Maternal Grandmother    Alzheimer's disease Maternal Grandfather     Osteoporosis Sister    Social History   Socioeconomic History   Marital status: Widowed    Spouse name: Not on file   Number of children: Not on file   Years of education: Not on file   Highest education level: Not on file  Occupational History   Not on file  Tobacco Use   Smoking status: Former    Packs/day: 0.30    Years: 10.00    Additional pack years: 0.00    Total pack years: 3.00    Types: Cigarettes    Quit date: 07/09/1978    Years since quitting: 44.2   Smokeless tobacco: Never  Vaping Use   Vaping Use: Never used  Substance and Sexual Activity   Alcohol use: Yes    Comment: 1-2 glasses of wine daily and a 12 oz beer daily   Drug use: No   Sexual activity: Never  Other Topics Concern   Not on file  Social History Narrative   Right handed    Lives with husband    Social Determinants of Health   Financial Resource Strain: Low Risk  (10/03/2022)   Overall Financial Resource Strain (CARDIA)    Difficulty of Paying Living Expenses: Not hard at all  Food Insecurity: No Food Insecurity (10/03/2022)   Hunger Vital Sign    Worried About Running Out of Food in the Last Year: Never true    Ran Out of Food in the Last Year: Never true  Transportation Needs: No Transportation Needs (10/03/2022)   PRAPARE - Hydrologist (Medical): No    Lack of Transportation (Non-Medical): No  Physical Activity: Sufficiently Active (10/03/2022)   Exercise Vital Sign    Days of Exercise per Week: 7 days    Minutes of Exercise per Session: 60 min  Stress: No Stress Concern Present (10/03/2022)   [place]    Feeling of Stress : Not at all  Social Connections: Moderately Isolated (10/03/2022)   Social Connection and Isolation Panel [NHANES]    Frequency of Communication with Friends and Family: More than three times a week    Frequency of Social Gatherings with Friends and Family: More than three  times a week    Attends Religious Services: Never    Marine scientist or Organizations: Yes    Attends Archivist Meetings: 1 to 4 times per year    Marital Status: Widowed    Tobacco Counseling Counseling given: Not Answered   Clinical Intake:  Pre-visit preparation completed: Yes  Pain : No/denies pain     BMI - recorded: 18.47 Nutritional Status: BMI <19  Underweight Nutritional Risks: None Diabetes: No  How often do you need to have someone help you when  you read instructions, pamphlets, or other written materials from your doctor or pharmacy?: 1 - Never  Diabetic?no  Interpreter Needed?: No  Information entered by :: Charlott Rakes, LPN   Activities of Daily Living    10/03/2022    9:07 AM  In your present state of health, do you have any difficulty performing the following activities:  Hearing? 0  Vision? 0  Difficulty concentrating or making decisions? 0  Walking or climbing stairs? 0  Dressing or bathing? 0  Doing errands, shopping? 0  Preparing Food and eating ? N  Using the Toilet? N  In the past six months, have you accidently leaked urine? N  Do you have problems with loss of bowel control? N  Managing your Medications? N  Managing your Finances? N  Housekeeping or managing your Housekeeping? N    Patient Care Team: [name], DO as PCP - General (Family Medicine) [name], MD as Consulting Physician (Gastroenterology) Calvert Cantor, MD as Consulting Physician (Ophthalmology) Lyndal Pulley, DO as Consulting Physician (Family Medicine) Cameron Sprang, MD as Consulting Physician (Neurology)  Indicate any recent Medical Services you may have received from other than [facility] providers in the past year (date may be approximate).     Assessment:   This is a routine wellness examination for Kelsey [name].  Hearing/Vision screen Hearing Screening - Comments:: Pt denies any hearing issues  Vision Screening - Comments:: Pt  follows up with [facility] for annual eye exams   Dietary issues and exercise activities discussed: Current Exercise Habits: Home exercise routine, Type of exercise: Other - see comments, Time (Minutes): 60, Frequency (Times/Week): 7, Weekly Exercise (Minutes/Week): 420   Goals Addressed             This Visit's Progress    Patient Stated       Gain weight to 120lbs        Depression Screen    10/03/2022    9:05 AM 06/13/2022   10:58 AM 10/17/2021    8:46 AM 09/06/2021   11:59 AM 05/08/2021    9:36 AM 08/19/2020   10:28 AM 08/17/2020    9:50 AM  PHQ 2/9 Scores  PHQ - 2 Score 0 0 0 0 0 1 0  PHQ- 9 Score      7     Fall Risk    10/03/2022    9:07 AM 06/13/2022   10:58 AM 10/17/2021    8:46 AM 09/06/2021   12:01 PM 05/08/2021    9:37 AM  Fall Risk   Falls in the past year? 1 1 0 0 0  Number falls in past yr: 1 0 0 0 0  Injury with Fall? 1 0 0 0 0  Comment head      Risk for fall due to : Impaired vision History of fall(s) No Fall Risks Impaired vision   Follow up Falls prevention discussed Falls evaluation completed Falls evaluation completed Falls prevention discussed Falls evaluation completed    FALL RISK PREVENTION PERTAINING TO THE HOME:  Any stairs in or around the home? Yes  If so, are there any without handrails? No  Home free of loose throw rugs in walkways, pet beds, electrical cords, etc? Yes  Adequate lighting in your home to reduce risk of falls? Yes   ASSISTIVE DEVICES UTILIZED TO PREVENT FALLS:  Life alert? No  Use of a cane, walker or w/c? No  Grab bars in the bathroom? Yes  Shower chair or bench  in shower? No  Elevated toilet seat or a handicapped toilet? No   TIMED UP AND GO:  Was the test performed? No .   Cognitive Function:    04/07/2018    3:17 PM  MMSE - Mini Mental State Exam  Orientation to time 5  Orientation to Place 5  Registration 3  Attention/ Calculation 5  Recall 3  Language- name 2 objects 2  Language- repeat 1  Language-  follow 3 step command 3  Language- read & follow direction 1  Write a sentence 1  Copy design 1  Total score 30      01/23/2021   10:43 AM  Montreal Cognitive Assessment   Visuospatial/ Executive (0/5) 5  Naming (0/3) 3  Attention: Read list of digits (0/2) 2  Attention: Read list of letters (0/1) 1  Attention: Serial 7 subtraction starting at 100 (0/3) 3  Language: Repeat phrase (0/2) 0  Language : Fluency (0/1) 1  Abstraction (0/2) 2  Delayed Recall (0/5) 5  Orientation (0/6) 6  Total 28  Adjusted Score (based on education) 28      10/03/2022    9:08 AM 09/06/2021   12:03 PM 08/17/2020    9:59 AM  6CIT Screen  What Year? 0 points 0 points 0 points  What month? 0 points 0 points 0 points  What time? 0 points 0 points 0 points  Count back from 20 0 points 0 points 0 points  Months in reverse 0 points 0 points 0 points  Repeat phrase 0 points 2 points 0 points  Total Score 0 points 2 points 0 points    Immunizations Immunization History  Administered Date(s) Administered   Fluad Quad(high Dose 65+) 04/14/2019, 08/19/2020, 05/08/2021, 03/21/2022   Hepatitis A, Adult 03/23/2014   Influenza Whole 04/09/2011   Influenza, High Dose Seasonal PF 05/27/2015, 04/12/2016, 04/02/2017, 04/07/2018   Influenza,inj,Quad PF,6+ Mos 03/19/2014   Moderna Covid-19 Vaccine Bivalent Booster 86yrs & up 05/25/2021   PFIZER(Purple Top)SARS-COV-2 Vaccination 07/20/2019, 08/19/2019   Pneumococcal Conjugate-13 12/02/2013, 04/02/2014   Pneumococcal Polysaccharide-23 12/08/2015   Tdap 12/02/2013, 03/08/2021   Zoster Recombinat (Shingrix) 06/13/2020, 08/19/2020    TDAP status: Up to date  Flu Vaccine status: Up to date  Pneumococcal vaccine status: Up to date  Covid-19 vaccine status: Completed vaccines  Qualifies for Shingles Vaccine? Yes   Zostavax completed Yes   Shingrix Completed?: Yes  Screening Tests Health Maintenance  Topic Date Due   DEXA SCAN  02/04/2023   MAMMOGRAM   06/12/2023   Medicare Annual Wellness (AWV)  10/03/2023   DTaP/Tdap/Td (3 - Td or Tdap) 03/09/2031   Pneumonia Vaccine 74+ Years old  Completed   INFLUENZA VACCINE  Completed   Hepatitis C Screening  Completed   Zoster Vaccines- Shingrix  Completed   HPV VACCINES  Aged Out   COLONOSCOPY (Pts 45-54yrs Insurance coverage will need to be confirmed)  Discontinued   COVID-19 Vaccine  Discontinued    Health Maintenance  There are no preventive care reminders to display for this patient.   Colorectal cancer screening: No longer required.   Mammogram status: Completed 06/11/22. Repeat every year  Bone Density status: Completed 02/03/21. Results reflect: Bone density results: OSTEOPOROSIS. Repeat every 2 years.   Additional Screening:  Hepatitis C Screening: Completed 12/02/13  Vision Screening: Recommended annual ophthalmology exams for early detection of glaucoma and other disorders of the eye. Is the patient up to date with their annual eye exam?  Yes  Who is  the provider or what is the name of the office in which the patient attends annual eye exams? [facility] If pt is not established with a provider, would they like to be referred to a provider to establish care?  no .   Dental Screening: Recommended annual dental exams for proper oral hygiene  Community Resource Referral / Chronic Care Management: CRR required this visit?  No   CCM required this visit?  No      Plan:     I have personally reviewed and noted the following in the patient's chart:   Medical and social history Use of alcohol, tobacco or illicit drugs  Current medications and supplements including opioid prescriptions. Patient is not currently taking opioid prescriptions. Functional ability and status Nutritional status Physical activity Advanced directives List of other physicians Hospitalizations, surgeries, and ER visits in previous 12 months Vitals Screenings to include cognitive, depression, and  falls Referrals and appointments  In addition, I have reviewed and discussed with patient certain preventive protocols, quality metrics, and best practice recommendations. A written personalized care plan for preventive services as well as general preventive health recommendations were provided to patient.     Willette Brace, LPN   [id number]   Nurse Notes: none

## 2022-10-08 DIAGNOSIS — F419 Anxiety disorder, unspecified: Secondary | ICD-10-CM | POA: Diagnosis not present

## 2022-10-08 DIAGNOSIS — F4321 Adjustment disorder with depressed mood: Secondary | ICD-10-CM | POA: Diagnosis not present

## 2022-10-16 DIAGNOSIS — F4321 Adjustment disorder with depressed mood: Secondary | ICD-10-CM | POA: Diagnosis not present

## 2022-10-16 DIAGNOSIS — F419 Anxiety disorder, unspecified: Secondary | ICD-10-CM | POA: Diagnosis not present

## 2022-10-19 ENCOUNTER — Telehealth: Payer: Self-pay | Admitting: Family Medicine

## 2022-10-19 NOTE — Telephone Encounter (Signed)
Pt would like to know if she is due for her Prolia injection. Please give the patient a call to inform her either way.

## 2022-10-19 NOTE — Telephone Encounter (Signed)
Pt is needing PA completed for prolia injections last one expired 12/23

## 2022-10-20 ENCOUNTER — Other Ambulatory Visit: Payer: Self-pay | Admitting: Family Medicine

## 2022-10-26 DIAGNOSIS — F419 Anxiety disorder, unspecified: Secondary | ICD-10-CM | POA: Diagnosis not present

## 2022-10-26 DIAGNOSIS — F4321 Adjustment disorder with depressed mood: Secondary | ICD-10-CM | POA: Diagnosis not present

## 2022-10-30 NOTE — Progress Notes (Unsigned)
[name] [address] [place] [address] Phone: [phone] Subjective:   [name], am serving as a scribe for Dr. Antoine Primas.  I'm seeing this patient by the request  of:  [name], [name], DO  CC: back and neck pain   [id number]  Kelsey [name] is a 77 y.o. female coming in with complaint of back and neck pain. OMT on 09/24/2022. Patient states that she has been trimming up bamboo and her back hurts.   Recently she tried a new stoke and had increase in neck pain. Wants to know if this is a safe option.     Medications patient has been prescribed:   Taking:         Reviewed prior external information including notes and imaging from previsou exam, outside providers and external EMR if available.   As well as notes that were available from care everywhere and other healthcare systems.  Past medical history, social, surgical and family history all reviewed in electronic medical record.  No pertanent information unless stated regarding to the chief complaint.   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    Uterine fibroid 11/2012   See Dr. Estanislado Pandy note, surgery 11/2012; calcified- multiple   Vaginitis 09/13/2011    Allergies  Allergen Reactions   Penicillins Rash    Has patient had a PCN reaction causing  immediate rash, facial/tongue/throat swelling, SOB or lightheadedness with hypotension: no Has patient had a PCN reaction causing severe rash involving mucus membranes or skin necrosis: yes Has patient had a PCN reaction that required hospitalization: no Has patient had a PCN reaction occurring within the last 10 years: no If all of the above answers are "NO", then may proceed with Cephalosporin use.    Sulfa Antibiotics Rash    Under arm and thighs     Review of Systems:  No headache, visual changes, nausea, vomiting, diarrhea, constipation, dizziness, abdominal pain, skin rash, fevers, chills, night sweats, weight loss, swollen lymph nodes, body aches, joint swelling, chest pain, shortness of breath, mood changes. POSITIVE muscle aches  Objective  Blood pressure 98/64, pulse 66, height  (1.651 m), weight 110 lb (49.9 kg), SpO2 97 %.   General: No apparent distress alert and oriented x3 mood and affect normal, dressed appropriately.  HEENT: Pupils equal, extraocular movements intact  Respiratory: Patient's speak in full sentences and does not appear short of breath  Cardiovascular: No lower extremity edema, non tender, no erythema  MSK:  Back significant loss of lordosis  Neck exam does have loss of lordosis.  Tightness with sidebending bilaterally especially to the left.  Osteopathic findings  T8 extended rotated and side bent left L2 flexed rotated and side  bent right L4 F RS right  Sacrum right on right     Assessment and Plan:  Degenerative lumbar spinal stenosis Low back exam does have some loss lordosis.  Tender to palpation still noted.  Try more muscle energy with some resolution of some discomfort and pain.  Discussed could do potentially epidurals as well.  Increase activity slowly.  Follow-up again 6 to 8 weeks otherwise    Nonallopathic problems  Decision today to treat with OMT was based on Physical Exam  After verbal consent patient was treated with   ME, FPR techniques in  thoracic, lumbar, and sacral  areas  Patient tolerated the procedure well with improvement in symptoms  Patient given exercises, stretches and lifestyle modifications  See medications in patient instructions if given  Patient will follow up in 4-8 weeks      The above documentation has been reviewed and is accurate and complete Judi Saa, DO        Note: This dictation was prepared with Dragon dictation along with smaller phrase technology. Any transcriptional errors that result from this process are unintentional.

## 2022-10-31 ENCOUNTER — Encounter: Payer: Self-pay | Admitting: Family Medicine

## 2022-10-31 ENCOUNTER — Ambulatory Visit (INDEPENDENT_AMBULATORY_CARE_PROVIDER_SITE_OTHER): Payer: Medicare PPO | Admitting: Family Medicine

## 2022-10-31 VITALS — BP 98/64 | HR 66 | Ht 65.0 in | Wt 110.0 lb

## 2022-10-31 DIAGNOSIS — M48061 Spinal stenosis, lumbar region without neurogenic claudication: Secondary | ICD-10-CM

## 2022-10-31 DIAGNOSIS — M9903 Segmental and somatic dysfunction of lumbar region: Secondary | ICD-10-CM | POA: Diagnosis not present

## 2022-10-31 DIAGNOSIS — M9904 Segmental and somatic dysfunction of sacral region: Secondary | ICD-10-CM

## 2022-10-31 DIAGNOSIS — M9902 Segmental and somatic dysfunction of thoracic region: Secondary | ICD-10-CM | POA: Diagnosis not present

## 2022-10-31 NOTE — Assessment & Plan Note (Signed)
Low back exam does have some loss lordosis.  Tender to palpation still noted.  Try more muscle energy with some resolution of some discomfort and pain.  Discussed could do potentially epidurals as well.  Increase activity slowly.  Follow-up again 6 to 8 weeks otherwise

## 2022-10-31 NOTE — Patient Instructions (Signed)
Happy Birthday Get a cake See you again in 5 weeks

## 2022-11-07 ENCOUNTER — Ambulatory Visit (INDEPENDENT_AMBULATORY_CARE_PROVIDER_SITE_OTHER): Payer: Medicare PPO | Admitting: Family Medicine

## 2022-11-07 ENCOUNTER — Encounter: Payer: Self-pay | Admitting: Family Medicine

## 2022-11-07 VITALS — BP 114/74 | HR 71 | Temp 98.1°F | Ht 64.35 in | Wt 111.8 lb

## 2022-11-07 DIAGNOSIS — Z0000 Encounter for general adult medical examination without abnormal findings: Secondary | ICD-10-CM | POA: Diagnosis not present

## 2022-11-07 DIAGNOSIS — E782 Mixed hyperlipidemia: Secondary | ICD-10-CM | POA: Diagnosis not present

## 2022-11-07 DIAGNOSIS — Z1231 Encounter for screening mammogram for malignant neoplasm of breast: Secondary | ICD-10-CM

## 2022-11-07 DIAGNOSIS — M810 Age-related osteoporosis without current pathological fracture: Secondary | ICD-10-CM | POA: Diagnosis not present

## 2022-11-07 DIAGNOSIS — Z131 Encounter for screening for diabetes mellitus: Secondary | ICD-10-CM

## 2022-11-07 DIAGNOSIS — M4800 Spinal stenosis, site unspecified: Secondary | ICD-10-CM | POA: Insufficient documentation

## 2022-11-07 DIAGNOSIS — K219 Gastro-esophageal reflux disease without esophagitis: Secondary | ICD-10-CM | POA: Diagnosis not present

## 2022-11-07 DIAGNOSIS — Z5181 Encounter for therapeutic drug level monitoring: Secondary | ICD-10-CM

## 2022-11-07 DIAGNOSIS — E559 Vitamin D deficiency, unspecified: Secondary | ICD-10-CM | POA: Diagnosis not present

## 2022-11-07 DIAGNOSIS — Z79899 Other long term (current) drug therapy: Secondary | ICD-10-CM | POA: Diagnosis not present

## 2022-11-07 DIAGNOSIS — M47812 Spondylosis without myelopathy or radiculopathy, cervical region: Secondary | ICD-10-CM | POA: Insufficient documentation

## 2022-11-07 LAB — COMPREHENSIVE METABOLIC PANEL
ALT: 16 U/L (ref 0–35)
AST: 24 U/L (ref 0–37)
Albumin: 4.3 g/dL (ref 3.5–5.2)
Alkaline Phosphatase: 59 U/L (ref 39–117)
BUN: 16 mg/dL (ref 6–23)
CO2: 31 mEq/L (ref 19–32)
Calcium: 9.1 mg/dL (ref 8.4–10.5)
Chloride: 101 mEq/L (ref 96–112)
Creatinine, Ser: 0.65 mg/dL (ref 0.40–1.20)
GFR: 85.17 mL/min (ref >60.00)
Glucose, Bld: 75 mg/dL (ref 70–99)
Potassium: 4.7 mEq/L (ref 3.5–5.1)
Sodium: 139 mEq/L (ref 135–145)
Total Bilirubin: 0.8 mg/dL (ref 0.2–1.2)
Total Protein: 7.1 g/dL (ref 6.0–8.3)

## 2022-11-07 LAB — LIPID PANEL
Cholesterol: 233 mg/dL — ABNORMAL HIGH (ref 0–200)
HDL: 100.3 mg/dL (ref >39.00)
LDL Cholesterol: 121 mg/dL — ABNORMAL HIGH (ref 0–99)
NonHDL: 133.13
Total CHOL/HDL Ratio: 2
Triglycerides: 62 mg/dL (ref 0.0–149.0)
VLDL: 12.4 mg/dL (ref 0.0–40.0)

## 2022-11-07 LAB — CBC WITH DIFFERENTIAL/PLATELET
Basophils Absolute: 0 10*3/uL (ref 0.0–0.1)
Basophils Relative: 1 % (ref 0.0–3.0)
Eosinophils Absolute: 0.1 10*3/uL (ref 0.0–0.7)
Eosinophils Relative: 3.1 % (ref 0.0–5.0)
HCT: 38.3 % (ref 36.0–46.0)
Hemoglobin: 12.9 g/dL (ref 12.0–15.0)
Lymphocytes Relative: 29.3 % (ref 12.0–46.0)
Lymphs Abs: 1.3 10*3/uL (ref 0.7–4.0)
MCHC: 33.8 g/dL (ref 30.0–36.0)
MCV: 94.8 fl (ref 78.0–100.0)
Monocytes Absolute: 0.3 10*3/uL (ref 0.1–1.0)
Monocytes Relative: 7.2 % (ref 3.0–12.0)
Neutro Abs: 2.7 10*3/uL (ref 1.4–7.7)
Neutrophils Relative %: 59.4 % (ref 43.0–77.0)
Platelets: 251 10*3/uL (ref 150.0–400.0)
RBC: 4.04 Mil/uL (ref 3.87–5.11)
RDW: 14 % (ref 11.5–15.5)
WBC: 4.6 10*3/uL (ref 4.0–10.5)

## 2022-11-07 LAB — MAGNESIUM: Magnesium: 2.3 mg/dL (ref 1.5–2.5)

## 2022-11-07 LAB — VITAMIN B12: Vitamin B-12: 779 pg/mL (ref 211–911)

## 2022-11-07 LAB — TSH: TSH: 1.55 u[IU]/mL (ref 0.35–5.50)

## 2022-11-07 LAB — HEMOGLOBIN A1C: Hgb A1c MFr Bld: 5.8 % (ref 4.6–6.5)

## 2022-11-07 LAB — VITAMIN D 25 HYDROXY (VIT D DEFICIENCY, FRACTURES): VITD: 18.55 ng/mL — ABNORMAL LOW (ref 30.00–100.00)

## 2022-11-07 MED ORDER — ATORVASTATIN CALCIUM 10 MG PO TABS: 10.0000 mg | ORAL_TABLET | Freq: Every evening | ORAL | 3 refills | Status: DC

## 2022-11-07 MED ORDER — VITAMIN D (ERGOCALCIFEROL) 1.25 MG (50000 UNIT) PO CAPS: 50000.0000 [IU] | ORAL_CAPSULE | ORAL | 3 refills | Status: DC

## 2022-11-07 MED ORDER — OMEPRAZOLE 40 MG PO CPDR: DELAYED_RELEASE_CAPSULE | ORAL | 3 refills | Status: DC

## 2022-11-07 NOTE — Patient Instructions (Addendum)
Return in about 1 year (around 11/08/2023) for cpe (20 min), Routine chronic condition follow-up .   Also remember to schedule an appt to discuss altitude/travel meds in [place] to see you today.  I have refilled the medication(s) we provide.   If labs were collected, we will inform you of lab results once received either by echart message or telephone call.   - echart message- for normal results that have been seen by the patient already.   - telephone call: abnormal results or if patient has not viewed results in their echart.

## 2022-11-07 NOTE — Progress Notes (Signed)
Patient ID: Kelsey [name], female  DOB: 03-23-1946, 77 y.o.   MRN: [id number] Patient Care Team    Relationship Specialty Notifications Start End  Natalia Leatherwood, DO PCP - General Family Medicine  05/27/15   Charna Elizabeth, MD Consulting Physician Gastroenterology  12/08/15   Nelson Chimes, MD Consulting Physician Ophthalmology  12/08/15   Judi Saa, DO Consulting Physician Family Medicine  04/07/18   Van Clines, MD Consulting Physician Neurology  01/23/21     Chief Complaint  Patient presents with   Annual Exam    [facility]; pt is fasting    Subjective: Kelsey [name] is a 77 y.o.  Female  present for CPE and Chronic Conditions/illness Management . All past medical history, surgical history, allergies, family history, immunizations, medications and social history were updated in the electronic medical record today. All recent labs, ED visits and hospitalizations within the last year were reviewed.  Health maintenance:  Mammogram: completed: UTD 06/11/2022, ordered for 2024 [facility] Immunizations: tdap UTD 2022, Influenza UTD 2023 (encouraged yearly), PNA series completed, Shingrix completed Infectious disease screening: Hep C completed DEXA: last completed 02/03/2021, result -3.2, follow up needed 01/2023> ordered today  [facility] Assistive device: None Oxygen use: None Patient has a Dental home. Hospitalizations/ED visits: Reviewed  Osteoporosis/vit d def: Is compliant with Prolia every 6 months for her osteoporosis.  DEXA is due this year.  She compliant with her vitamin D supplementation.   Hyperlipidemia: Patient reports compliance with Lipitor 10 mg  GERD: Reports her reflux is well-controlled with use of omeprazole daily, without symptoms return causing chest discomfort and mild cough.       10/03/2022    9:05 AM 06/13/2022   10:58 AM 10/17/2021    8:46 AM 09/06/2021   11:59 AM 05/08/2021    9:36 AM  Depression screen PHQ 2/9  Decreased Interest 0 0 0 0 0  Down,  Depressed, Hopeless 0 0 0 0 0  PHQ - 2 Score 0 0 0 0 0      08/19/2020   10:28 AM  GAD 7 : Generalized Anxiety Score  Nervous, Anxious, on Edge 2  Control/stop worrying 2  Worry too much - different things 2  Trouble relaxing 2  Restless 1  Easily annoyed or irritable 1  Afraid - awful might happen 2  Total GAD 7 Score 12   Immunization History  Administered Date(s) Administered   Fluad Quad(high Dose 65+) 04/14/2019, 08/19/2020, 05/08/2021, 03/21/2022   Hepatitis A, Adult 03/23/2014   Influenza Whole 04/09/2011   Influenza, High Dose Seasonal PF 05/27/2015, 04/12/2016, 04/02/2017, 04/07/2018   Influenza,inj,Quad PF,6+ Mos 03/19/2014   Moderna Covid-19 Vaccine Bivalent Booster 22yrs & up 05/25/2021   PFIZER(Purple Top)SARS-COV-2 Vaccination 07/20/2019, 08/19/2019   Pneumococcal Conjugate-13 12/02/2013, 04/02/2014   Pneumococcal Polysaccharide-23 12/08/2015   Tdap 12/02/2013, 03/08/2021   Zoster Recombinat (Shingrix) 06/13/2020, 08/19/2020   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   Bladder prolapse, female, acquired    Chicken pox as a child   De Quervain's tenosynovitis, left 12/11/2017   Dysuria 09/13/2011   Female bladder prolapse 09/13/2011   Fibrocystic breast 09/14/2011   GERD (gastroesophageal reflux disease)    H/O measles    H/O mumps    History of iron deficiency anemia 12/02/2013   Hyperlipidemia    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    Uterine fibroid 11/2012   See Dr. Estanislado Pandy note, surgery 11/2012; calcified- multiple   Vaginitis 09/13/2011   Allergies   Allergen Reactions   Penicillins Rash    Has patient had a PCN reaction causing immediate rash, facial/tongue/throat swelling, SOB or lightheadedness with hypotension: no Has patient had a PCN reaction causing severe rash involving mucus membranes or skin necrosis: yes Has patient had a PCN reaction that required hospitalization: no Has patient had a PCN reaction occurring within the last 10 years: no If all of the above answers are "NO", then may proceed with Cephalosporin use.    Sulfa Antibiotics Rash    Under arm and thighs   Past Surgical History:  Procedure Laterality Date   DILATATION & CURRETTAGE/HYSTEROSCOPY WITH RESECTOCOPE N/A 11/06/2012   Procedure: DILATATION & CURETTAGE/HYSTEROSCOPY WITH RESECTOCOPE;  Surgeon: Esmeralda Arthur, MD;  Location: [facility];  Service: Gynecology;  Laterality: N/A;   HERNIA REPAIR  1992   I & D EXTREMITY Left 03/08/2021   Procedure: IRRIGATION AND DEBRIDEMENT OF THUMB AND ARM;  Surgeon: Betha Loa, MD;  Location: [facility];  Service: Orthopedics;  Laterality: Left;   small intestine hernia  1994   WISDOM TOOTH EXTRACTION     Family History  Problem Relation Age of Onset   Heart disease Mother    Osteoporosis Mother    Cancer Father        prostate   Osteoporosis Sister    Gout Brother    Gout Maternal Grandmother    Heart disease Maternal Grandmother    Alzheimer's disease Maternal Grandfather    Osteoporosis Sister    Social History   Social History Narrative   Right handed    Lives with husband     Allergies as of 11/07/2022       Reactions   Penicillins Rash   Has patient had a PCN reaction causing immediate rash, facial/tongue/throat swelling, SOB or lightheadedness with hypotension: no Has patient had a PCN reaction causing severe rash involving mucus membranes or skin necrosis: yes Has patient had a PCN reaction that required hospitalization: no Has patient had a PCN reaction occurring within the last 10 years: no If all of the  above answers are "NO", then may proceed with Cephalosporin use.   Sulfa Antibiotics Rash   Under arm and thighs        Medication List        Accurate as of Nov 07, 2022  8:39 AM. If you have any questions, ask your nurse or doctor.          atorvastatin 10 MG tablet Commonly known as: LIPITOR Take 1 tablet (10 mg total) by mouth every evening.   omeprazole 40 MG capsule Commonly known as: PRILOSEC TAKE 1 CAPSULE DAILY. What changed: See the new instructions. Changed by: Felix Pacini, DO   Vitamin D (Ergocalciferol) 1.25 MG (50000 UNIT) Caps capsule Commonly known as: DRISDOL Take 1 capsule (50,000 Units total) by mouth every 7 (seven) days.        All past medical history, surgical history, allergies, family history, immunizations andmedications were updated in the EMR today and reviewed under the history and medication portions  of their EMR.     No results found for this or any previous visit (from the past 2160 hour(s)).  MM 3D SCREEN BREAST BILATERAL  Result Date: 06/12/2022 CLINICAL DATA:  Screening. EXAM: DIGITAL SCREENING BILATERAL MAMMOGRAM WITH TOMOSYNTHESIS AND CAD TECHNIQUE: Bilateral screening digital craniocaudal and mediolateral oblique mammograms were obtained. Bilateral screening digital breast tomosynthesis was performed. The images were evaluated with computer-aided detection. COMPARISON:  Previous exam(s). ACR Breast Density Category d: The breast tissue is extremely dense, which lowers the sensitivity of mammography FINDINGS: There are no findings suspicious for malignancy. IMPRESSION: No mammographic evidence of malignancy. A result letter of this screening mammogram will be mailed directly to the patient. RECOMMENDATION: Screening mammogram in one year. (Code:SM-B-01Y) BI-RADS CATEGORY  1: Negative. Electronically Signed   By: Sande Brothers M.D.   On: 06/12/2022 10:15     Review of Systems  All other systems reviewed and are negative.  14 pt  review of systems performed and negative (unless mentioned in an HPI)  Objective: BP 114/74   Pulse 71   Temp 98.1 F (36.7 C)   Ht 5' 4.35" (1.634 m)   Wt 111 lb 12.8 oz (50.7 kg)   SpO2 96%   BMI 18.98 kg/m  Physical Exam Vitals and nursing note reviewed.  Constitutional:      General: She is not in acute distress.    Appearance: Normal appearance. She is not ill-appearing or toxic-appearing.  HENT:     Head: Normocephalic and atraumatic.     Right Ear: Tympanic membrane, ear canal and external ear normal. There is no impacted cerumen.     Left Ear: Tympanic membrane, ear canal and external ear normal. There is no impacted cerumen.     Nose: No congestion or rhinorrhea.     Mouth/Throat:     Mouth: Mucous membranes are moist.     Pharynx: Oropharynx is clear. No oropharyngeal exudate or posterior oropharyngeal erythema.  Eyes:     General: No scleral icterus.       Right eye: No discharge.        Left eye: No discharge.     Extraocular Movements: Extraocular movements intact.     Conjunctiva/sclera: Conjunctivae normal.     Pupils: Pupils are equal, round, and reactive to light.  Cardiovascular:     Rate and Rhythm: Normal rate and regular rhythm.     Pulses: Normal pulses.     Heart sounds: Normal heart sounds. No murmur heard.    No friction rub. No gallop.  Pulmonary:     Effort: Pulmonary effort is normal. No respiratory distress.     Breath sounds: Normal breath sounds. No stridor. No wheezing, rhonchi or rales.  Chest:     Chest wall: No tenderness.  Abdominal:     General: Abdomen is flat. Bowel sounds are normal. There is no distension.     Palpations: Abdomen is soft. There is no mass.     Tenderness: There is no abdominal tenderness. There is no right CVA tenderness, left CVA tenderness, guarding or rebound.     Hernia: No hernia is present.  Musculoskeletal:        General: No swelling, tenderness or deformity. Normal range of motion.     Cervical back:  Normal range of motion and neck supple. No rigidity or tenderness.     Right lower leg: No edema.     Left lower leg: No edema.  Lymphadenopathy:     Cervical: No cervical  adenopathy.  Skin:    General: Skin is warm and dry.     Coloration: Skin is not jaundiced or pale.     Findings: No bruising, erythema, lesion or rash.  Neurological:     General: No focal deficit present.     Mental Status: She is alert and oriented to person, place, and time. Mental status is at baseline.     Cranial Nerves: No cranial nerve deficit.     Sensory: No sensory deficit.     Motor: No weakness.     Coordination: Coordination normal.     Gait: Gait normal.     Deep Tendon Reflexes: Reflexes normal.  Psychiatric:        Mood and Affect: Mood normal.        Behavior: Behavior normal.        Thought Content: Thought content normal.        Judgment: Judgment normal.      Assessment/plan: Kelsey [name] is a 77 y.o. female present for CPE and routine chronic conditions B12 deficiency/vitamin D deficiency B12 and vitamin D levels checked today. Patient is supplementation  Mixed hyperlipidemia/ Encounter for monitoring statin therapy Stable Continue atorvastatin for added cardiovascular protection  CBC, CMP, lipids and TSH collected today   Osteoporosis without current pathological fracture, unspecified osteoporosis type/Vitamin D deficiency DEXA due 01/2023-order placed today Continue Prolia injection every 6 months Continue vitamin D supplementation Vitamin D collected today  GERD long-term proton pump inhibitor use: Stable Continue omeprazole 40 mg daily B12, vitamin D and magnesium levels checked today  Breast cancer screening by mammogram - MM 3D SCREENING MAMMOGRAM BILATERAL BREAST; Future  Diabetes mellitus screening - Hemoglobin A1c Routine general medical examination at a health care facility - Hemoglobin A1c Patient was encouraged to exercise greater than 150 minutes a  week. Patient was encouraged to choose a diet filled with fresh fruits and vegetables, and lean meats. AVS provided to patient today for education/recommendation on gender specific health and safety maintenance. Mammogram: completed: UTD 06/11/2022, ordered for 2024 [facility] Immunizations: tdap UTD 2022, Influenza UTD 2023 (encouraged yearly), PNA series completed, Shingrix completed Infectious disease screening: Hep C completed DEXA: last completed 02/03/2021, result -3.2, follow up needed 01/2023> ordered today  [facility]  Return in about 1 year (around 11/08/2023) for cpe (20 min), Routine chronic condition follow-up .  Orders Placed This Encounter  Procedures   DG Bone Density   MM 3D SCREENING MAMMOGRAM BILATERAL BREAST   CBC with Differential/Platelet   Comprehensive metabolic panel   Hemoglobin A1c   Lipid panel   TSH   Vitamin D (25 hydroxy)   B12   Magnesium    Meds ordered this encounter  Medications   atorvastatin (LIPITOR) 10 MG tablet    Sig: Take 1 tablet (10 mg total) by mouth every evening.    Dispense:  90 tablet    Refill:  3   omeprazole (PRILOSEC) 40 MG capsule    Sig: TAKE 1 CAPSULE DAILY.    Dispense:  90 capsule    Refill:  3   Vitamin D, Ergocalciferol, (DRISDOL) 1.25 MG (50000 UNIT) CAPS capsule    Sig: Take 1 capsule (50,000 Units total) by mouth every 7 (seven) days.    Dispense:  12 capsule    Refill:  3   Referral Orders  No referral(s) requested today     Electronically signed by: Felix Pacini, DO [facility]

## 2022-11-13 ENCOUNTER — Other Ambulatory Visit (HOSPITAL_COMMUNITY): Payer: Self-pay

## 2022-11-13 ENCOUNTER — Telehealth: Payer: Self-pay

## 2022-11-13 NOTE — Telephone Encounter (Signed)
Prolia VOB initiated via [email or web address] 

## 2022-11-14 ENCOUNTER — Ambulatory Visit (INDEPENDENT_AMBULATORY_CARE_PROVIDER_SITE_OTHER): Payer: Medicare PPO

## 2022-11-14 DIAGNOSIS — H25813 Combined forms of age-related cataract, bilateral: Secondary | ICD-10-CM | POA: Diagnosis not present

## 2022-11-14 DIAGNOSIS — H11823 Conjunctivochalasis, bilateral: Secondary | ICD-10-CM | POA: Diagnosis not present

## 2022-11-14 DIAGNOSIS — E559 Vitamin D deficiency, unspecified: Secondary | ICD-10-CM | POA: Diagnosis not present

## 2022-11-14 DIAGNOSIS — H02834 Dermatochalasis of left upper eyelid: Secondary | ICD-10-CM | POA: Diagnosis not present

## 2022-11-14 DIAGNOSIS — H02831 Dermatochalasis of right upper eyelid: Secondary | ICD-10-CM | POA: Diagnosis not present

## 2022-11-14 MED ORDER — DENOSUMAB 60 MG/ML ~~LOC~~ SOSY
60.0000 mg | PREFILLED_SYRINGE | Freq: Once | SUBCUTANEOUS | Status: AC
Start: 2022-11-14 — End: 2022-11-14
  Administered 2022-11-14: 60 mg via SUBCUTANEOUS

## 2022-11-14 NOTE — Progress Notes (Signed)
Prolia given with no issues

## 2022-11-15 NOTE — Telephone Encounter (Signed)
noted 

## 2022-11-15 NOTE — Telephone Encounter (Signed)
PA is good through 07/09/23

## 2022-11-16 ENCOUNTER — Other Ambulatory Visit (HOSPITAL_COMMUNITY): Payer: Self-pay

## 2022-11-16 NOTE — Telephone Encounter (Signed)
Pt ready for scheduling for Prolia on or after : 11/14/22  Out-of-pocket cost due at time of visit: $0  Primary: [employer] Prolia co-insurance: 0% Admin fee co-insurance: 0%  Secondary: [employer] for life-medsup Prolia co-insurance:  Admin fee co-insurance:   Medical Benefit Details: Date Benefits were checked: 11/15/22 Deductible: NO/ Coinsurance: 0%/ Admin Fee: 0%  Prior Auth: APPROVED PA# [id number] Expiration Date: 07/09/2023   Pharmacy benefit: Copay $--- If patient wants fill through the pharmacy benefit please send prescription to:  --- , and include estimated need by date in rx notes. Pharmacy will ship medication directly to the office.  Patient NOT eligible for Prolia Copay Card. Copay Card can make patient's cost as little as $25. Link to apply: [email or web address]  ** This summary of benefits is an estimation of the patient's out-of-pocket cost. Exact cost may very based on individual plan coverage.

## 2022-12-04 ENCOUNTER — Ambulatory Visit: Payer: Medicare PPO | Admitting: Family Medicine

## 2022-12-06 NOTE — Progress Notes (Signed)
[name] [address] [place] [address] Phone: [phone] Subjective:    I'm seeing this patient by the request  of:  [name], [name], DO  CC: back and neck pain f/u   [id number]  Kelsey [name] is a 77 y.o. female coming in with complaint of back and neck pain. OMT 10/31/2022. Patient states back pain everyday, wants to go over bone density scan from 2022, she wants to know which lumbar segments are DDD         Reviewed prior external information including notes and imaging from previsou exam, outside providers and external EMR if available.   As well as notes that were available from care everywhere and other healthcare systems.  Past medical history, social, surgical and family history all reviewed in electronic medical record.  No pertanent information unless stated regarding to the chief complaint.   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    Uterine fibroid 11/2012   See Dr. Estanislado Pandy note, surgery 11/2012; calcified- multiple   Vaginitis 09/13/2011    Allergies  Allergen Reactions   Penicillins Rash    Has patient had a PCN reaction causing immediate rash,  facial/tongue/throat swelling, SOB or lightheadedness with hypotension: no Has patient had a PCN reaction causing severe rash involving mucus membranes or skin necrosis: yes Has patient had a PCN reaction that required hospitalization: no Has patient had a PCN reaction occurring within the last 10 years: no If all of the above answers are "NO", then may proceed with Cephalosporin use.    Sulfa Antibiotics Rash    Under arm and thighs     Review of Systems:  No headache, visual changes, nausea, vomiting, diarrhea, constipation, dizziness, abdominal pain, skin rash, fevers, chills, night sweats, weight loss, swollen lymph nodes, body aches, joint swelling, chest pain, shortness of breath, mood changes. POSITIVE muscle aches  Objective  Blood pressure 108/82, pulse 85, height 5\' 4"  (1.626 m), weight 109 lb (49.4 kg), SpO2 97 %.   General: No apparent distress alert and oriented x3 mood and affect normal, dressed appropriately.  HEENT: Pupils equal, extraocular movements intact  Respiratory: Patient's speak in full sentences and does not appear short of breath  Cardiovascular: No lower extremity edema, non tender, no erythema  Low back exam does have some loss lordosis noted.  Significant loss related.  Patient does have tightness with straight leg test which is new for her.  25 degrees of extension of the back.   Osteopathic findings   T3 extended rotated and side bent right inhaled rib T9 extended rotated and side  bent left L2 flexed rotated and side bent right L3 rotated and side bent left L4 flexed rotated and side bent right Sacrum right on right       Assessment and Plan:  Degenerative lumbar spinal stenosis Significant spinal stenosis noted.  Discussed icing regimen and home exercises, discussed which activities to do and which ones to avoid.  Follow-up again in 6 to 8 weeks    Nonallopathic problems  Decision today to treat with OMT was based on Physical  Exam  After verbal consent patient was treated with HVLA, ME, FPR techniques in  rib, thoracic, lumbar, and sacral  areas  Patient tolerated the procedure well with improvement in symptoms  Patient given exercises, stretches and lifestyle modifications  See medications in patient instructions if given  Patient will follow up in 6-8 weeks    The above documentation has been reviewed and is accurate and complete Judi Saa, DO          Note: This dictation was prepared with Dragon dictation along with smaller phrase technology. Any transcriptional errors that result from this process are unintentional.

## 2022-12-07 ENCOUNTER — Ambulatory Visit (INDEPENDENT_AMBULATORY_CARE_PROVIDER_SITE_OTHER): Payer: Medicare PPO | Admitting: Family Medicine

## 2022-12-07 ENCOUNTER — Encounter: Payer: Self-pay | Admitting: Family Medicine

## 2022-12-07 VITALS — BP 108/82 | HR 85 | Ht 64.0 in | Wt 109.0 lb

## 2022-12-07 DIAGNOSIS — M9908 Segmental and somatic dysfunction of rib cage: Secondary | ICD-10-CM

## 2022-12-07 DIAGNOSIS — M9904 Segmental and somatic dysfunction of sacral region: Secondary | ICD-10-CM | POA: Diagnosis not present

## 2022-12-07 DIAGNOSIS — M9902 Segmental and somatic dysfunction of thoracic region: Secondary | ICD-10-CM | POA: Diagnosis not present

## 2022-12-07 DIAGNOSIS — M48061 Spinal stenosis, lumbar region without neurogenic claudication: Secondary | ICD-10-CM

## 2022-12-07 DIAGNOSIS — M9903 Segmental and somatic dysfunction of lumbar region: Secondary | ICD-10-CM

## 2022-12-07 DIAGNOSIS — K3184 Gastroparesis: Secondary | ICD-10-CM | POA: Diagnosis not present

## 2022-12-07 NOTE — Addendum Note (Signed)
Addended by: Ethlyn Daniels on: 12/07/2022 11:25 AM   Modules accepted: Orders

## 2022-12-07 NOTE — Assessment & Plan Note (Signed)
Worsening discomfort at this moment.  It is not nearly 10 years from her previous colonoscopy.  Having difficulty with what appears to be gastroparesis.  Follow-up with gastroenterology referral placed

## 2022-12-07 NOTE — Assessment & Plan Note (Signed)
Significant spinal stenosis noted.  Discussed icing regimen and home exercises, discussed which activities to do and which ones to avoid.  Follow-up again in 6 to 8 weeks

## 2022-12-07 NOTE — Patient Instructions (Signed)
You do not need to wait for epidural if you need it  See me in 6 weeks

## 2022-12-12 DIAGNOSIS — H1045 Other chronic allergic conjunctivitis: Secondary | ICD-10-CM | POA: Diagnosis not present

## 2022-12-12 DIAGNOSIS — H04123 Dry eye syndrome of bilateral lacrimal glands: Secondary | ICD-10-CM | POA: Diagnosis not present

## 2022-12-24 ENCOUNTER — Encounter: Payer: Self-pay | Admitting: Gastroenterology

## 2023-01-01 NOTE — Progress Notes (Unsigned)
[name] [address] [place] [address] Phone: [phone] Subjective:   [name], am serving as a scribe for Dr. Antoine Primas.  I'm seeing this patient by the request  of:  [name], [name], DO  CC: Low back pain follow-up  ION:[id number]  Kelsey [name] is a 77 y.o. female coming in with complaint of back and neck pain. OMT on 12/07/2022. Patient states that her pain is the same as last visit. Pain worse in mornings and from sit to stand. Pain improves with activity.   Medications patient has been prescribed:   Taking:         Reviewed prior external information including notes and imaging from previsou exam, outside providers and external EMR if available.   As well as notes that were available from care everywhere and other healthcare systems.  Past medical history, social, surgical and family history all reviewed in electronic medical record.  No pertanent information unless stated regarding to the chief complaint.   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    Uterine fibroid 11/2012   See Dr. Estanislado Pandy note, surgery 11/2012; calcified- multiple   Vaginitis  09/13/2011    Allergies  Allergen Reactions   Penicillins Rash    Has patient had a PCN reaction causing immediate rash, facial/tongue/throat swelling, SOB or lightheadedness with hypotension: no Has patient had a PCN reaction causing severe rash involving mucus membranes or skin necrosis: yes Has patient had a PCN reaction that required hospitalization: no Has patient had a PCN reaction occurring within the last 10 years: no If all of the above answers are "NO", then may proceed with Cephalosporin use.    Sulfa Antibiotics Rash    Under arm and thighs     Review of Systems:  No headache, visual changes, nausea, vomiting, diarrhea, constipation, dizziness, abdominal pain, skin rash, fevers, chills, night sweats, weight loss, swollen lymph nodes, body aches, joint swelling, chest pain, shortness of breath, mood changes. POSITIVE muscle aches  Objective  Blood pressure 92/68, pulse 77, height 5\' 4"  (1.626 m), weight 110 lb (49.9 kg), SpO2 97 %.   General: No apparent distress alert and oriented x3 mood and affect normal, dressed appropriately.  HEENT: Pupils equal, extraocular movements intact  Respiratory: Patient's speak in full sentences and does not appear short of breath  Cardiovascular: No lower extremity edema, non tender, no erythema  Low back exam does have some loss of lordosis noted.  Patient does have some limited range of motion in all planes.  Tightness with FABER test.  Osteopathic findings  C3 flexed  rotated and side bent right C6 flexed rotated and side bent left T4 extended rotated and side bent right inhaled rib T9 extended rotated and side bent left L2 flexed rotated and side bent right Sacrum right on right       Assessment and Plan:  No problem-specific Assessment & Plan notes found for this encounter.    Nonallopathic problems  Decision today to treat with OMT was based on Physical Exam  After verbal consent patient was treated with HVLA, ME, FPR  techniques in cervical, rib, thoracic, lumbar, and sacral  areas  Patient tolerated the procedure well with improvement in symptoms  Patient given exercises, stretches and lifestyle modifications  See medications in patient instructions if given  Patient will follow up in 4-8 weeks     The above documentation has been reviewed and is accurate and complete Judi Saa, DO         Note: This dictation was prepared with Dragon dictation along with smaller phrase technology. Any transcriptional errors that result from this process are unintentional.

## 2023-01-03 ENCOUNTER — Ambulatory Visit (INDEPENDENT_AMBULATORY_CARE_PROVIDER_SITE_OTHER): Payer: Medicare PPO | Admitting: Family Medicine

## 2023-01-03 ENCOUNTER — Encounter: Payer: Self-pay | Admitting: Family Medicine

## 2023-01-03 VITALS — BP 92/68 | HR 77 | Ht 64.0 in | Wt 110.0 lb

## 2023-01-03 DIAGNOSIS — M9903 Segmental and somatic dysfunction of lumbar region: Secondary | ICD-10-CM | POA: Diagnosis not present

## 2023-01-03 DIAGNOSIS — M9902 Segmental and somatic dysfunction of thoracic region: Secondary | ICD-10-CM | POA: Diagnosis not present

## 2023-01-03 DIAGNOSIS — M9904 Segmental and somatic dysfunction of sacral region: Secondary | ICD-10-CM | POA: Diagnosis not present

## 2023-01-03 DIAGNOSIS — M9901 Segmental and somatic dysfunction of cervical region: Secondary | ICD-10-CM | POA: Diagnosis not present

## 2023-01-03 DIAGNOSIS — M9908 Segmental and somatic dysfunction of rib cage: Secondary | ICD-10-CM

## 2023-01-03 DIAGNOSIS — M48061 Spinal stenosis, lumbar region without neurogenic claudication: Secondary | ICD-10-CM

## 2023-01-03 NOTE — Patient Instructions (Signed)
Ensure nightly Goal is 1-2kg next time we see you See me in 5-6 weeks

## 2023-01-03 NOTE — Assessment & Plan Note (Signed)
Continues to have the difficulty with the back.  Discussed with patient about icing regimen and home exercises, discussed which activities to do and which ones to avoid.  Increase activity slowly.  We discussed different medications and I discussed the possibility of an MRI back could potentially help her with also weight gain.  Patient at this point would like to try more of nutritional supplementation and see if this helps.  Ideal weight would be 60 kg.  Follow-up with me again in 6 weeks

## 2023-01-23 NOTE — Progress Notes (Signed)
[name] [address] [place] [address] Phone: [phone] Subjective:   Kelsey [name], am serving as a scribe for Dr. Antoine Primas.  I'm seeing this patient by the request  of:  [name], [name], DO  CC: back and neck pain follow up   [id number]  Kelsey [name] is a 77 y.o. female coming in with complaint of back and neck pain. OMT on 01/03/2023. Patient states same per usual. No new concerns.  Medications patient has been prescribed:   Taking:         Reviewed prior external information including notes and imaging from previsou exam, outside providers and external EMR if available.   As well as notes that were available from care everywhere and other healthcare systems.  Past medical history, social, surgical and family history all reviewed in electronic medical record.  No pertanent information unless stated regarding to the chief complaint.   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    Uterine fibroid 11/2012   See Dr. Estanislado Pandy note, surgery 11/2012; calcified- multiple   Vaginitis 09/13/2011    Allergies  Allergen Reactions   Penicillins Rash    Has  patient had a PCN reaction causing immediate rash, facial/tongue/throat swelling, SOB or lightheadedness with hypotension: no Has patient had a PCN reaction causing severe rash involving mucus membranes or skin necrosis: yes Has patient had a PCN reaction that required hospitalization: no Has patient had a PCN reaction occurring within the last 10 years: no If all of the above answers are "NO", then may proceed with Cephalosporin use.    Sulfa Antibiotics Rash    Under arm and thighs     Review of Systems:  No headache, visual changes, nausea, vomiting, diarrhea, constipation, dizziness, abdominal pain, skin rash, fevers, chills, night sweats, weight loss, swollen lymph nodes, body aches, joint swelling, chest pain, shortness of breath, mood changes. POSITIVE muscle aches  Objective  Blood pressure 102/64, pulse 80, height 5\' 4"  (1.626 m), weight 110 lb (49.9 kg), SpO2 96%.   General: No apparent distress alert and oriented x3 mood and affect normal, dressed appropriately.  HEENT: Pupils equal, extraocular movements intact  Respiratory: Patient's speak in full sentences and does not appear short of breath  Cardiovascular: No lower extremity edema, non tender, no erythema  Low back does have significant loss of lordosis.  Worsening pain with extension of the back.  Patient does have relatively good though core strength noted.  Still tightness with straight leg test.  Osteopathic findings   T6 extended rotated and side bent left L2 flexed rotated  and side bent right L5 flexed rotated and side bent left Sacrum right on right       Assessment and Plan:  Degenerative lumbar spinal stenosis Significant  Discussed needing epidural patient at this point would like to avoid it if possible.  Patient is going continue to stay active otherwise.  Discussed icing regimen and home exercises.  Discussed core strengthening again.  Patient knows to call us if she does feel that she would like  the epidural.  No change in medications today.  Total time discussing with patient 35 minutes    Nonallopathic problems  Decision today to treat with OMT was based on Physical Exam  After verbal consent patient was treated with HVLA, ME, FPR techniques in  thoracic, lumbar, and sacral  areas  Patient tolerated the procedure well with improvement in symptoms  Patient given exercises, stretches and lifestyle modifications  See medications in patient instructions if given  Patient will follow up in 4-8 weeks     The above documentation has been reviewed and is accurate and complete Judi Saa, DO         Note: This dictation was prepared with Dragon dictation along with smaller phrase technology. Any transcriptional errors that result from this process are unintentional.

## 2023-01-29 ENCOUNTER — Telehealth: Payer: Self-pay

## 2023-01-29 NOTE — Telephone Encounter (Signed)
Patient is requesting to change locations for her bone scan.  Patient used to go to [facility] prior to having them done at [facility].  She is requesting to change locations to [facility] [address], [address] [place], [place]  They are holding a time for her, so she has asked to do as soon as possible.

## 2023-01-30 NOTE — Telephone Encounter (Signed)
noted 

## 2023-01-31 ENCOUNTER — Encounter: Payer: Self-pay | Admitting: Family Medicine

## 2023-01-31 ENCOUNTER — Ambulatory Visit (INDEPENDENT_AMBULATORY_CARE_PROVIDER_SITE_OTHER): Payer: Medicare PPO | Admitting: Family Medicine

## 2023-01-31 VITALS — BP 102/64 | HR 80 | Ht 64.0 in | Wt 110.0 lb

## 2023-01-31 DIAGNOSIS — M9904 Segmental and somatic dysfunction of sacral region: Secondary | ICD-10-CM | POA: Diagnosis not present

## 2023-01-31 DIAGNOSIS — M9902 Segmental and somatic dysfunction of thoracic region: Secondary | ICD-10-CM

## 2023-01-31 DIAGNOSIS — M48061 Spinal stenosis, lumbar region without neurogenic claudication: Secondary | ICD-10-CM

## 2023-01-31 DIAGNOSIS — M9903 Segmental and somatic dysfunction of lumbar region: Secondary | ICD-10-CM | POA: Diagnosis not present

## 2023-01-31 NOTE — Patient Instructions (Signed)
Great to see you Keep working on the weight See me again in 5-6 weeks

## 2023-01-31 NOTE — Assessment & Plan Note (Signed)
Significant  Discussed needing epidural patient at this point would like to avoid it if possible.  Patient is going continue to stay active otherwise.  Discussed icing regimen and home exercises.  Discussed core strengthening again.  Patient knows to call us if she does feel that she would like the epidural.  No change in medications today.  Total time discussing with patient 35 minutes

## 2023-02-01 ENCOUNTER — Other Ambulatory Visit: Payer: Self-pay

## 2023-02-01 DIAGNOSIS — M810 Age-related osteoporosis without current pathological fracture: Secondary | ICD-10-CM

## 2023-02-01 NOTE — Telephone Encounter (Signed)
noted 

## 2023-02-01 NOTE — Telephone Encounter (Signed)
Patient is requesting a call to update her on the status on the bone density location change. She is asking to see if she can get in prior to Jan.  She is needing to be scheduled scheduled sometime in Nov.

## 2023-02-05 NOTE — Telephone Encounter (Signed)
Patient called and is now aware that she needs to call to cancel the previous scan, however she can't understand why she did not receive a call to be notified of this. She wants to know does she need to do anything else now to get scheduled with the new location? She is also requesting to have her mammogram prior to Dec. 4th, however she is aware insurance may charge since this sooner than the year date. Please let patient know if this can be approved.

## 2023-02-05 NOTE — Telephone Encounter (Signed)
Spoke with patient regarding results/recommendations.  

## 2023-02-06 NOTE — Telephone Encounter (Signed)
LM for pt to return call to discuss. Pt needs to call the location she has asked referral to be placed for scheduling

## 2023-02-06 NOTE — Telephone Encounter (Signed)
Please give Kelsey [name] a call and advise on next steps to get scheduled with the new location in [place]. She has canceled the previous appointment.

## 2023-02-08 NOTE — Telephone Encounter (Signed)
Kelsey [name] has been given the run around and has called several times this week. As instructed she called [facility], however they would not allow her to schedule since there was no referral placed for the location she wants. The location is once again [facility]  [address] in [place]. The number for this location is [phone]. She needs a referral to be placed prior to her being able to schedule.

## 2023-02-11 NOTE — Telephone Encounter (Signed)
No referral is needed for a Dexa scan. Only an order. Per previous note order has been faxed

## 2023-02-11 NOTE — Telephone Encounter (Signed)
Order refaxed

## 2023-02-11 NOTE — Addendum Note (Signed)
Addended by: Filomena Jungling on: 02/11/2023 11:35 AM   Modules accepted: Orders

## 2023-02-18 DIAGNOSIS — Z13820 Encounter for screening for osteoporosis: Secondary | ICD-10-CM | POA: Diagnosis not present

## 2023-02-18 DIAGNOSIS — M810 Age-related osteoporosis without current pathological fracture: Secondary | ICD-10-CM | POA: Diagnosis not present

## 2023-02-18 LAB — HM DEXA SCAN

## 2023-02-19 ENCOUNTER — Ambulatory Visit: Payer: Medicare PPO | Admitting: Family Medicine

## 2023-02-19 DIAGNOSIS — S51811A Laceration without foreign body of right forearm, initial encounter: Secondary | ICD-10-CM | POA: Diagnosis not present

## 2023-02-19 DIAGNOSIS — W540XXA Bitten by dog, initial encounter: Secondary | ICD-10-CM | POA: Diagnosis not present

## 2023-02-19 NOTE — Telephone Encounter (Signed)
Bone density results completed 02/18/2023 have been received Lumbar spine -0.9 Right hip/thigh area: -3.0  This is a mild improvement in density since 2022, in which her score was -3.2. Recommend continuing prolia.

## 2023-02-19 NOTE — Telephone Encounter (Signed)
LM for pt to return call to discuss.  

## 2023-02-19 NOTE — Telephone Encounter (Signed)
Received Dexa results from [facility] on 08/13. Will place on PCP desk for review.

## 2023-02-22 ENCOUNTER — Ambulatory Visit (INDEPENDENT_AMBULATORY_CARE_PROVIDER_SITE_OTHER): Payer: Medicare PPO | Admitting: Gastroenterology

## 2023-02-22 ENCOUNTER — Encounter: Payer: Self-pay | Admitting: Gastroenterology

## 2023-02-22 VITALS — BP 98/60 | HR 61 | Ht 64.0 in | Wt 108.0 lb

## 2023-02-22 DIAGNOSIS — R1319 Other dysphagia: Secondary | ICD-10-CM | POA: Diagnosis not present

## 2023-02-22 DIAGNOSIS — R6881 Early satiety: Secondary | ICD-10-CM | POA: Diagnosis not present

## 2023-02-22 DIAGNOSIS — R636 Underweight: Secondary | ICD-10-CM

## 2023-02-22 DIAGNOSIS — K219 Gastro-esophageal reflux disease without esophagitis: Secondary | ICD-10-CM | POA: Diagnosis not present

## 2023-02-22 DIAGNOSIS — Z1211 Encounter for screening for malignant neoplasm of colon: Secondary | ICD-10-CM

## 2023-02-22 NOTE — Patient Instructions (Addendum)
You have been scheduled for an endoscopy. Please follow written instructions given to you at your visit today.  If you use inhalers (even only as needed), please bring them with you on the day of your procedure.  If you take any of the following medications, they will need to be adjusted prior to your procedure:   DO NOT TAKE 7 DAYS PRIOR TO TEST- Trulicity (dulaglutide) Ozempic, Wegovy (semaglutide) Mounjaro (tirzepatide) Bydureon Bcise (exanatide extended release)  DO NOT TAKE 1 DAY PRIOR TO YOUR TEST Rybelsus (semaglutide) Adlyxin (lixisenatide) Victoza (liraglutide) Byetta (exanatide) ___________________________________________________________________________    If your blood pressure at your visit was 140/90 or greater, please contact your primary care physician to follow up on this.   If you are age 77 or older, your body mass index should be between 23-30. Your Body mass index is 18.54 kg/m. If this is out of the aforementioned range listed, please consider follow up with your Primary Care Provider.  If you are age 77 or younger, your body mass index should be between 19-25. Your Body mass index is 18.54 kg/m. If this is out of the aformentioned range listed, please consider follow up with your Primary Care Provider.   ________________________________________________________  The [facility] providers would like to encourage you to use [facility] to communicate with providers for non-urgent requests or questions.  Due to long hold times on the telephone, sending your provider a message by [facility] may be a faster and more efficient way to get a response.  Please allow 48 business hours for a response.  Please remember that this is for non-urgent requests.  _______________________________________________________  Due to recent changes in healthcare laws, you may see the results of your imaging and laboratory studies on MyChart before your provider has had a chance to review them.   We understand that in some cases there may be results that are confusing or concerning to you. Not all laboratory results come back in the same time frame and the provider may be waiting for multiple results in order to interpret others.  Please give [place] 48 hours in order for your provider to thoroughly review all the results before contacting the office for clarification of your results.    Thank you for entrusting me with your care and choosing [facility].  Dr Tomasa Rand

## 2023-02-22 NOTE — Progress Notes (Addendum)
HPI : Kelsey [name] is a 77 y.o. female with osteoporosis, who is referred to [place] by Judi Saa, DO for further evaluation of gastroparesis.  In reviewing her electronic medical record, I could find no record of gastroparesis or an abnormal gastric emptying study.  I do not see any prior gastroenterology encounters.  When asked the patient why she is here today, she states that she is concerned about her low weight, and is looking for ways to gain weight.  It seems she is more concerned about her weight from a cosmetic standpoint, rather than a concern there is something medically wrong. I reviewed her weights in the electronic medical record, and it appears that her weight has been stable for the past 10 years. She does report that she gets full and uncomfortable if she tries to eat what she considers a normal amount of food.  This has been going on her entire life according to her. She does have symptoms of heartburn and acid reflux which are well-controlled with once daily omeprazole.  She will occasionally have breakthrough symptoms if she eats the wrong foods.  She denies any problems with nausea or vomiting.  Occasionally she will have the solid dysphagia, but denies any episodes of having to forcefully vomit stuck food.  She reports regular bowel movements.  She avoids milk, as she thinks she is lactose intolerant.  She has no family history of GI malignancy.  She underwent a colonoscopy in 2015 and believes that a few polyps were removed.  She thinks she was recommended to repeat in 10 years.  She reports that her mother lived into her late 30s, and her maternal grandmother was over 100 when she passed away.  CT Abdomen/Pelvis Aug 2021 (left upper quadrant abdominal pain) IMPRESSION: 1. No acute intra-abdominal or pelvic pathology. No bowel obstruction. Normal appendix. 2. Calcified uterine fibroids  Colonoscopy 2015 Dr. 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    Uterine fibroid 11/2012   See Dr. Estanislado Pandy note, surgery 11/2012; calcified- multiple   Vaginitis 09/13/2011     Past Surgical History:  Procedure Laterality Date   DILATATION & CURRETTAGE/HYSTEROSCOPY WITH RESECTOCOPE N/A 11/06/2012   Procedure: DILATATION & CURETTAGE/HYSTEROSCOPY WITH RESECTOCOPE;  Surgeon: Esmeralda Arthur, MD;  Location: [facility];  Service: Gynecology;  Laterality: N/A;   HERNIA REPAIR  1992   I & D EXTREMITY Left 03/08/2021   Procedure: IRRIGATION AND DEBRIDEMENT OF THUMB AND ARM;  Surgeon: Betha Loa, MD;  Location: [facility] OR;  Service: Orthopedics;  Laterality: Left;  small intestine hernia  1994   WISDOM TOOTH EXTRACTION     Family History  Problem Relation Age of Onset   Heart disease Mother    Osteoporosis Mother    Cancer Father        prostate   Osteoporosis Sister    Gout Brother    Gout Maternal Grandmother    Heart disease Maternal Grandmother    Alzheimer's disease Maternal Grandfather    Osteoporosis Sister    Social History   Tobacco Use   Smoking status: Former    Current packs/day: 0.00    Average packs/day: 0.3 packs/day  for 10.0 years (3.0 ttl pk-yrs)    Types: Cigarettes    Start date: 07/09/1968    Quit date: 07/09/1978    Years since quitting: 44.6   Smokeless tobacco: Never  Vaping Use   Vaping status: Never Used  Substance Use Topics   Alcohol use: Yes    Comment: 1-2 glasses of wine daily and a 12 oz beer daily   Drug use: No   Current Outpatient Medications  Medication Sig Dispense Refill   cycloSPORINE (RESTASIS) 0.05 % ophthalmic emulsion 1 drop 2 (two) times daily.     loteprednol (LOTEMAX) 0.5 % ophthalmic suspension 1 drop 2 (two) times daily.     atorvastatin (LIPITOR) 10 MG tablet Take 1 tablet (10 mg total) by mouth every evening. 90 tablet 3   Calcium Carb-Cholecalciferol (CALCIUM 500 + D) 500-5 MG-MCG TABS Take 1 tablet by mouth daily.     Coenzyme Q10 (CO Q-10) 30 MG CAPS 200 mg 1 q day     omeprazole (PRILOSEC) 40 MG capsule TAKE 1 CAPSULE DAILY. 90 capsule 3   Vitamin D, Ergocalciferol, (DRISDOL) 1.25 MG (50000 UNIT) CAPS capsule Take 1 capsule (50,000 Units total) by mouth every 7 (seven) days. 12 capsule 3   No current facility-administered medications for this visit.   Allergies  Allergen Reactions   Penicillins Rash    Has patient had a PCN reaction causing immediate rash, facial/tongue/throat swelling, SOB or lightheadedness with hypotension: no Has patient had a PCN reaction causing severe rash involving mucus membranes or skin necrosis: yes Has patient had a PCN reaction that required hospitalization: no Has patient had a PCN reaction occurring within the last 10 years: no If all of the above answers are "NO", then may proceed with Cephalosporin use.    Sulfa Antibiotics Rash    Under arm and thighs     Review of Systems: All systems reviewed and negative except where noted in HPI.    No results found.  Physical Exam: BP 98/60   Pulse 61   Ht 5\' 4"  (1.626 m)   Wt 108 lb (49 kg)   BMI 18.54 kg/m  Constitutional: Pleasant,well-developed, thin, [place] female  in no acute distress. HEENT: Normocephalic and atraumatic. Conjunctivae are normal. No scleral icterus. Neck supple.  Cardiovascular: Normal rate, regular rhythm.  Pulmonary/chest: Effort normal and breath sounds normal. No wheezing, rales or rhonchi. Abdominal: Soft, nondistended, nontender. Bowel sounds active throughout. There are no masses palpable. No hepatomegaly. Extremities: no edema Lymphadenopathy: No cervical adenopathy noted. Neurological: Alert and oriented to person place and time. Skin: Skin is warm and dry. No rashes noted. Psychiatric: Normal mood and affect. Behavior is normal.  CBC    Component Value Date/Time   WBC 4.6 11/07/2022 0836   RBC 4.04 11/07/2022 0836   HGB 12.9 11/07/2022 0836   HCT 38.3 11/07/2022 0836   PLT  251.0 11/07/2022 0836   MCV 94.8 11/07/2022 0836   MCH 31.3 03/08/2021 1557   MCHC 33.8 11/07/2022 0836   RDW 14.0 11/07/2022 0836   LYMPHSABS 1.3 11/07/2022 0836   MONOABS 0.3 11/07/2022 0836   EOSABS 0.1 11/07/2022 0836   BASOSABS 0.0 11/07/2022 0836    CMP     Component Value Date/Time   NA 139 11/07/2022 0836   NA 139 04/20/2011 0000   K 4.7 11/07/2022 0836   K 4.9 04/19/2009 0000   CL 101 11/07/2022 0836   CL 102 04/19/2009 0000   CO2 31 11/07/2022 0836   CO2 33 04/19/2009 0000   GLUCOSE 75 11/07/2022 0836   BUN 16 11/07/2022 0836   BUN 9 04/20/2011 0000   CREATININE 0.65 11/07/2022 0836   CREATININE 0.63 03/02/2020 1446   CALCIUM 9.1 11/07/2022 0836   CALCIUM 9.5 04/19/2009 0000   PROT 7.1 11/07/2022 0836   ALBUMIN 4.3 11/07/2022 0836   AST 24 11/07/2022 0836   ALT 16 11/07/2022 0836   ALKPHOS 59 11/07/2022 0836   BILITOT 0.8 11/07/2022 0836   GFRNONAA >60 08/05/2016 2133   GFRNONAA 91 04/19/2009 0000   GFRAA >60 08/05/2016 2133   GFRAA 110 04/19/2009 0000       Latest Ref Rng & Units 11/07/2022    8:36 AM 01/17/2022   11:02 AM 03/08/2021    3:57 PM  CBC EXTENDED  WBC 4.0 - 10.5 K/uL 4.6  4.5  8.3   RBC 3.87 -  5.11 Mil/uL 4.04  3.99  3.96   Hemoglobin 12.0 - 15.0 g/dL 78.4  69.6  29.5   HCT 36.0 - 46.0 % 38.3  38.1  38.6   Platelets 150.0 - 400.0 K/uL 251.0  245.0  233   NEUT# 1.4 - 7.7 K/uL 2.7     Lymph# 0.7 - 4.0 K/uL 1.3         ASSESSMENT AND PLAN:  77 year old female with history of osteoporosis, but otherwise no major comorbidities with chronic symptoms of early satiety and postprandial discomfort, with occasional dysphagia.  Her BMI is on the border of being low at 18.5, but has been stable for at least 10 years.  She does not have any significant symptoms of diarrhea, abdominal pain, nausea/vomiting that would make me suspect that there is a primary underlying GI etiology for her low BMI.  I reassured the patient that she should not be concerned about her weight, but if she does want to try to to gain weight, she should increase the consumption of calorically dense foods, particularly in liquid form.  I recommended she start drinking nutrition shakes once or twice a day and to eat other calorically dense foods such as peanut butter, nuts and avocado more regularly. Given her upper GI symptoms of early satiety and dysphagia, her long GERD history, and her [place] ethnicity with increased rates of stomach cancer, I think an upper endoscopy is not unreasonable. Given her history of osteoporosis, she should make efforts to stop PPI use.  Although the patient is 40, she is in good health and has a family history of longevity.  It would be reasonable to consider further colon cancer screening for her even if she has no history of precancerous polyps.  We will request the colonoscopy records from Dr. Kenna Gilbert office and will tentatively plan to schedule a colonoscopy based on the details of when her last colonoscopy was and what the findings were.  Personal concern about weight -Provided reassurance -  Discussed caloric-dense foods with high-protein and fat   Longstanding GERD/dysphagia -  EGD -Continue omeprazole once daily for now; if EGD unremarkable, would recommend transitioning to Pepcid for GERD control, given osteoporosis  Colon cancer screening - Request records from Dr. Kenna Gilbert office - Patient would be reasonable candidate for colon cancer screening past age 59 given her lack of comorbidities and familial longevity  The details, risks (including bleeding, perforation, infection, missed lesions, medication reactions and possible hospitalization or surgery if complications occur), benefits, and alternatives to EGD with possible biopsy and possible dilation were discussed with the patient and she consents to proceed.   Wang [place] E. Tomasa Rand, MD [facility]   I spent a total of 45 minutes reviewing the patient's medical record, interviewing and examining the patient, discussing her diagnosis and management of her condition going forward, and documenting in the medical record   Judi Saa, DO   ADDENDUM: Records received from Dr. Kenna Gilbert office.   Colonoscopy August 23, 2003 (Dr. Loreta Ave) Indication: Colon screening Normal colonoscopy except for scattered ulcerations over a prominent ileocecal valve, question ischemic colitis biopsied No polyps, masses or diverticulosis  Pathology Right colon: Active inflammation with focal ulceration.  Differential includes NSAID related ulceration, Crohn's, ischemia and resolving infectious colitis.  EGD May 24, 2008 (Dr. Loreta Ave) Indications: Dysphagia, epigastric pain, chest pain, GERD Normal esophagus, GEJ and proximal small bowel.  Multiple erosions in the mid body of the stomach, biopsied.  Pathology Active erosive gastritis, no H. pylori  EGD January 06, 2014 (Dr. Loreta Ave) Indications: Epigastric pain and GERD Single small nodule in the lower esophagus, biopsied Diffuse mild inflammation in the stomach Normal duodenum  Pathology Nodule, esophagus Glycogenic acanthosis  Colonoscopy January 06, 2014 (Dr.  Loreta Ave) Normal colon Normal terminal ileum Small internal hemorrhoids on retroflexion Recommended to repeat colonoscopy in 10 years  Patient will be due for repeat colonoscopy in July 2025.

## 2023-02-28 NOTE — Progress Notes (Unsigned)
[name] [address] [place] [address] Phone: [phone] Subjective:   [name], am serving as a scribe for Dr. Antoine Primas.  I'm seeing this patient by the request  of:  [name], [name], DO  CC: Neck and back pain follow-up  [id number]  Kelsey [name] is a 77 y.o. female coming in with complaint of back and neck pain. OMT on 01/31/2023. Patient states that she stretches before bed and in the mornings. Seems to help her mobility. Notices less stiffness.   Medications patient has been prescribed:   Taking:         Reviewed prior external information including notes and imaging from previsou exam, outside providers and external EMR if available.   As well as notes that were available from care everywhere and other healthcare systems.  Past medical history, social, surgical and family history all reviewed in electronic medical record.  No pertanent information unless stated regarding to the chief complaint.   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    Uterine fibroid 11/2012   See Dr. Estanislado Pandy note, surgery 11/2012; calcified- multiple   Vaginitis  09/13/2011    Allergies  Allergen Reactions   Penicillins Rash    Has patient had a PCN reaction causing immediate rash, facial/tongue/throat swelling, SOB or lightheadedness with hypotension: no Has patient had a PCN reaction causing severe rash involving mucus membranes or skin necrosis: yes Has patient had a PCN reaction that required hospitalization: no Has patient had a PCN reaction occurring within the last 10 years: no If all of the above answers are "NO", then may proceed with Cephalosporin use.    Sulfa Antibiotics Rash    Under arm and thighs     Review of Systems:  No headache, visual changes, nausea, vomiting, diarrhea, constipation, dizziness, abdominal pain, skin rash, fevers, chills, night sweats, weight loss, swollen lymph nodes, body aches, joint swelling, chest pain, shortness of breath, mood changes. POSITIVE muscle aches  Objective  Blood pressure 94/62, pulse 67, height 5\' 4"  (1.626 m), weight 110 lb (49.9 kg), SpO2 96%.   General: No apparent distress alert and oriented x3 mood and affect normal, dressed appropriately.  HEENT: Pupils equal, extraocular movements intact  Respiratory: Patient's speak in full sentences and does not appear short of breath  Cardiovascular: No lower extremity edema, non tender, no erythema  Low back still has tightness noted and seems to be more on the right side of the paraspinal musculature.  Does have worsening discomfort with any type of extension of the back.  Negative straight leg test noted.  Osteopathic findings  T9 extended rotated and side bent left T11 extended rotated and side bent right L5 flexed rotated and side bent left Sacrum right on right       Assessment and Plan:  Degenerative lumbar spinal stenosis Continues to have some difficulty overall the patient has made great progress with stretching at the moment.  Will continue to work on the strengthening especially of the hip abductors at this moment.   Discussed which activities to do and which ones to avoid.  Increase activity slowly over the course the next several weeks.  Discussed icing regimen.  Follow-up again in 6 to 8 weeks otherwise.    Nonallopathic problems  Decision today to treat with OMT was based on Physical Exam  After verbal consent patient was treated with HVLA, ME, FPR techniques in cervical, rib, thoracic, lumbar, and sacral  areas  Patient tolerated the procedure well with improvement in symptoms  Patient given exercises, stretches and lifestyle modifications  See medications in patient instructions if given  Patient will follow up in 4-8 weeks    The above documentation has been reviewed and is accurate and complete Judi Saa, DO          Note: This dictation was prepared with Dragon dictation along with smaller phrase technology. Any transcriptional errors that result from this process are unintentional.

## 2023-03-01 ENCOUNTER — Telehealth: Payer: Self-pay | Admitting: Gastroenterology

## 2023-03-01 NOTE — Telephone Encounter (Signed)
Inbound call from patients insurance stating they have not received a authorization request for patients upcoming procedure. Requesting it to be sent. Please advise.

## 2023-03-04 ENCOUNTER — Encounter: Payer: Self-pay | Admitting: Family Medicine

## 2023-03-04 ENCOUNTER — Ambulatory Visit (INDEPENDENT_AMBULATORY_CARE_PROVIDER_SITE_OTHER): Payer: Medicare PPO | Admitting: Family Medicine

## 2023-03-04 VITALS — BP 94/62 | HR 67 | Ht 64.0 in | Wt 110.0 lb

## 2023-03-04 DIAGNOSIS — M9904 Segmental and somatic dysfunction of sacral region: Secondary | ICD-10-CM | POA: Diagnosis not present

## 2023-03-04 DIAGNOSIS — M9902 Segmental and somatic dysfunction of thoracic region: Secondary | ICD-10-CM | POA: Diagnosis not present

## 2023-03-04 DIAGNOSIS — M9903 Segmental and somatic dysfunction of lumbar region: Secondary | ICD-10-CM

## 2023-03-04 DIAGNOSIS — M48061 Spinal stenosis, lumbar region without neurogenic claudication: Secondary | ICD-10-CM

## 2023-03-04 NOTE — Assessment & Plan Note (Signed)
Continues to have some difficulty overall the patient has made great progress with stretching at the moment.  Will continue to work on the strengthening especially of the hip abductors at this moment.  Discussed which activities to do and which ones to avoid.  Increase activity slowly over the course the next several weeks.  Discussed icing regimen.  Follow-up again in 6 to 8 weeks otherwise.

## 2023-03-04 NOTE — Patient Instructions (Signed)
Great to see you 5 min workouts are working See me in 4-5 weeks

## 2023-03-14 ENCOUNTER — Ambulatory Visit (AMBULATORY_SURGERY_CENTER): Payer: Medicare PPO | Admitting: Gastroenterology

## 2023-03-14 ENCOUNTER — Encounter: Payer: Self-pay | Admitting: Gastroenterology

## 2023-03-14 VITALS — BP 104/70 | HR 70 | Temp 98.2°F | Resp 12 | Ht 64.0 in | Wt 108.0 lb

## 2023-03-14 DIAGNOSIS — R6881 Early satiety: Secondary | ICD-10-CM

## 2023-03-14 DIAGNOSIS — K2970 Gastritis, unspecified, without bleeding: Secondary | ICD-10-CM

## 2023-03-14 DIAGNOSIS — K3189 Other diseases of stomach and duodenum: Secondary | ICD-10-CM | POA: Diagnosis not present

## 2023-03-14 DIAGNOSIS — K31A11 Gastric intestinal metaplasia without dysplasia, involving the antrum: Secondary | ICD-10-CM | POA: Diagnosis not present

## 2023-03-14 DIAGNOSIS — K317 Polyp of stomach and duodenum: Secondary | ICD-10-CM

## 2023-03-14 MED ORDER — SODIUM CHLORIDE 0.9 % IV SOLN: 500.0000 mL | INTRAVENOUS | Status: DC

## 2023-03-14 MED ORDER — FAMOTIDINE 20 MG PO TABS: 20.0000 mg | ORAL_TABLET | Freq: Two times a day (BID) | ORAL | 3 refills | Status: DC

## 2023-03-14 NOTE — Progress Notes (Signed)
Called to room to assist during endoscopic procedure.  Patient ID and intended procedure confirmed with present staff. Received instructions for my participation in the procedure from the performing physician.  

## 2023-03-14 NOTE — Patient Instructions (Signed)
Handouts Provided:  Gastritis  Recommend stopping PPI and trial of Pepcid for GERD symptoms given normal appearing esophagus and personal history of osteoporosis.  YOU HAD AN ENDOSCOPIC PROCEDURE TODAY AT THE [facility]:   Refer to the procedure report that was given to you for any specific questions about what was found during the examination.  If the procedure report does not answer your questions, please call your gastroenterologist to clarify.  If you requested that your care partner not be given the details of your procedure findings, then the procedure report has been included in a sealed envelope for you to review at your convenience later.  YOU SHOULD EXPECT: Some feelings of bloating in the abdomen. Passage of more gas than usual.  Walking can help get rid of the air that was put into your GI tract during the procedure and reduce the bloating. If you had a lower endoscopy (such as a colonoscopy or flexible sigmoidoscopy) you may notice spotting of blood in your stool or on the toilet paper. If you underwent a bowel prep for your procedure, you may not have a normal bowel movement for a few days.  Please Note:  You might notice some irritation and congestion in your nose or some drainage.  This is from the oxygen used during your procedure.  There is no need for concern and it should clear up in a day or so.  SYMPTOMS TO REPORT IMMEDIATELY:  Following upper endoscopy (EGD)  Vomiting of blood or coffee ground material  New chest pain or pain under the shoulder blades  Painful or persistently difficult swallowing  New shortness of breath  Fever of 100F or higher  Black, tarry-looking stools  For urgent or emergent issues, a gastroenterologist can be reached at any hour by calling [phone]. Do not use MyChart messaging for urgent concerns.    DIET:  We do recommend a small meal at first, but then you may proceed to your regular diet.  Drink plenty of fluids but you  should avoid alcoholic beverages for 24 hours.  ACTIVITY:  You should plan to take it easy for the rest of today and you should NOT DRIVE or use heavy machinery until tomorrow (because of the sedation medicines used during the test).    FOLLOW UP: Our staff will call the number listed on your records the next business day following your procedure.  We will call around 7:15- 8:00 am to check on you and address any questions or concerns that you may have regarding the information given to you following your procedure. If we do not reach you, we will leave a message.     If any biopsies were taken you will be contacted by phone or by letter within the next 1-3 weeks.  Please call us at [phone] if you have not heard about the biopsies in 3 weeks.    SIGNATURES/CONFIDENTIALITY: You and/or your care partner have signed paperwork which will be entered into your electronic medical record.  These signatures attest to the fact that that the information above on your After Visit Summary has been reviewed and is understood.  Full responsibility of the confidentiality of this discharge information lies with you and/or your care-partner.

## 2023-03-14 NOTE — Progress Notes (Signed)
Sedate, gd SR, tolerated procedure well, VSS, report to RN 

## 2023-03-14 NOTE — Progress Notes (Signed)
History and Physical Interval Note:  03/14/2023 10:46 AM  Kelsey [name]  has presented today for endoscopic procedure(s), with the diagnosis of  Encounter Diagnosis  Name Primary?   Early satiety Yes  .  The various methods of evaluation and treatment have been discussed with the patient and/or family. After consideration of risks, benefits and other options for treatment, the patient has consented to  the endoscopic procedure(s).   The patient's history has been reviewed, patient examined, no change in status, stable for endoscopic procedure(s).  I have reviewed the patient's chart and labs.  Questions were answered to the patient's satisfaction.     Ary Rudnick E. Tomasa Rand, MD [facility]

## 2023-03-14 NOTE — Op Note (Signed)
[facility] Patient Name: Kelsey [name] Procedure Date: 03/14/2023 10:50 AM MRN: [id number] Endoscopist: Lorin Picket E. Tomasa Rand , MD, [id number] Age: 77 Referring MD:  Date of Birth: 11/24/1945 Gender: Female Account #: [id number] Procedure:                Upper GI endoscopy Indications:              Esophageal reflux, Early satiety Medicines:                Monitored Anesthesia Care Procedure:                Pre-Anesthesia Assessment:                           - Prior to the procedure, a History and Physical                            was performed, and patient medications and                            allergies were reviewed. The patient's tolerance of                            previous anesthesia was also reviewed. The risks                            and benefits of the procedure and the sedation                            options and risks were discussed with the patient.                            All questions were answered, and informed consent                            was obtained. Prior Anticoagulants: The patient has                            taken no anticoagulant or antiplatelet agents. ASA                            Grade Assessment: II - A patient with mild systemic                            disease. After reviewing the risks and benefits,                            the patient was deemed in satisfactory condition to                            undergo the procedure.                           After obtaining informed consent, the endoscope was  passed under direct vision. Throughout the                            procedure, the patient's blood pressure, pulse, and                            oxygen saturations were monitored continuously. The                            GIF [id number] #[id number] was introduced through the                            mouth, and advanced to the third part of duodenum.                            The upper GI  endoscopy was accomplished without                            difficulty. The patient tolerated the procedure                            well. Scope In: Scope Out: Findings:                 The examined portions of the nasopharynx,                            oropharynx and larynx were normal.                           The examined esophagus was normal.                           Diffuse mildly congested mucosa was found in the                            gastric body. Biopsies were taken with a cold                            forceps for Helicobacter pylori testing.                           A single 3 mm sessile polyp was found in the                            gastric body. The polyp was removed with a cold                            biopsy forceps. Resection and retrieval were                            complete. Estimated blood loss was minimal.                           The exam of the stomach was  otherwise normal.                           Biopsies were taken with a cold forceps at the                            incisura and in the gastric antrum for Helicobacter                            pylori testing. Estimated blood loss was minimal.                           The examined duodenum was normal. Complications:            No immediate complications. Estimated Blood Loss:     Estimated blood loss was minimal. Impression:               - The examined portions of the nasopharynx,                            oropharynx and larynx were normal.                           - Normal esophagus.                           - Congestive gastropathy. Biopsied.                           - A single gastric polyp. Resected and retrieved.                           - Normal examined duodenum.                           - Biopsies were taken with a cold forceps for                            Helicobacter pylori testing. Recommendation:           - Patient has a contact number available for                             emergencies. The signs and symptoms of potential                            delayed complications were discussed with the                            patient. Return to normal activities tomorrow.                            Written discharge instructions were provided to the                            patient.                           -  Resume previous diet.                           - Continue present medications.                           - Await pathology results.                           - Recommend stopping PPI and trial of Pepcid for                            GERD symptoms given normal appearing esophagus and                            personal history of osteoporosis. Meaghann Choo E. Tomasa Rand, MD 03/14/2023 11:12:25 AM This report has been signed electronically.

## 2023-03-15 ENCOUNTER — Telehealth: Payer: Self-pay | Admitting: *Deleted

## 2023-03-15 NOTE — Telephone Encounter (Signed)
Post procedure follow up phone call. No answer at number given.  Left message on voicemail.  

## 2023-03-20 NOTE — Progress Notes (Signed)
Ms. 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  This finding is thought to represent an increased risk of stomach cancer in some patients.  Patients at highest risk are those patients with a family history of stomach cancer and those with a smoking history, as well as patients who are from areas with high rates of stomach cancer. Periodic surveillance (repeat endoscopy with biopsies) is often performed to monitor for development of cancer or further precancerous changes. The fact that the intestinal metaplasia was only seen focally in the antrum and not in the gastric body is a good finding, and is associated with a lower risk of stomach cancer.   Although I think your risk of stomach cancer is very low, I would recommend a repeat EGD in 3 years.

## 2023-03-26 DIAGNOSIS — H524 Presbyopia: Secondary | ICD-10-CM | POA: Diagnosis not present

## 2023-03-26 DIAGNOSIS — H11823 Conjunctivochalasis, bilateral: Secondary | ICD-10-CM | POA: Diagnosis not present

## 2023-03-26 DIAGNOSIS — H1045 Other chronic allergic conjunctivitis: Secondary | ICD-10-CM | POA: Diagnosis not present

## 2023-03-26 DIAGNOSIS — H04123 Dry eye syndrome of bilateral lacrimal glands: Secondary | ICD-10-CM | POA: Diagnosis not present

## 2023-03-26 DIAGNOSIS — H25813 Combined forms of age-related cataract, bilateral: Secondary | ICD-10-CM | POA: Diagnosis not present

## 2023-03-26 DIAGNOSIS — H52223 Regular astigmatism, bilateral: Secondary | ICD-10-CM | POA: Diagnosis not present

## 2023-04-03 NOTE — Progress Notes (Unsigned)
[name] [address] [place] [address] Phone: [phone] Subjective:   [name], am serving as a scribe for Dr. Antoine Primas.  I'm seeing this patient by the request  of:  [name], [name], DO  CC: Back pain follow-up  [id number]  Kelsey [name] is a 77 y.o. female coming in with complaint of back and neck pain. OMT 03/04/2023.  Patient states that she swam before her appointment and she is feeling good.     Medications patient has been prescribed: None  Taking:         Reviewed prior external information including notes and imaging from previsou exam, outside providers and external EMR if available.   As well as notes that were available from care everywhere and other healthcare systems.  Past medical history, social, surgical and family history all reviewed in electronic medical record.  No pertanent information unless stated regarding to the chief complaint.   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    Uterine fibroid 11/2012   See Dr. Estanislado Pandy note, surgery 11/2012; calcified- multiple   Vaginitis 09/13/2011    Allergies  Allergen Reactions    Penicillins Rash    Has patient had a PCN reaction causing immediate rash, facial/tongue/throat swelling, SOB or lightheadedness with hypotension: no Has patient had a PCN reaction causing severe rash involving mucus membranes or skin necrosis: yes Has patient had a PCN reaction that required hospitalization: no Has patient had a PCN reaction occurring within the last 10 years: no If all of the above answers are "NO", then may proceed with Cephalosporin use.    Sulfa Antibiotics Rash    Under arm and thighs     Review of Systems:  No headache, visual changes, nausea, vomiting, diarrhea, constipation, dizziness, abdominal pain, skin rash, fevers, chills, night sweats, weight loss, swollen lymph nodes, body aches, joint swelling, chest pain, shortness of breath, mood changes. POSITIVE muscle aches  Objective  Blood pressure 102/72, height 5\' 4"  (1.626 m), weight 109 lb (49.4 kg).   General: No apparent distress alert and oriented x3 mood and affect normal, dressed appropriately.  HEENT: Pupils equal, extraocular movements intact  Respiratory: Patient's speak in full sentences and does not appear short of breath  Cardiovascular: No lower extremity edema, non tender, no erythema  Gait MSK:  Back does have some loss of lordosis that is fairly severe.  He does have some discomfort with any type of extension of the back.  Osteopathic findings  C7 flexed rotated and side bent left T8 extended rotated and side bent left inhaled rib  L1 flexed rotated and side bent right L3 flexed rotated and side bent left Sacrum right on right    Assessment and Plan:  Degenerative lumbar spinal stenosis Known degenerative disc disease.  Continue to be active.  No change in medications.  Responds well to more of the muscle energy and the osteopathic manipulation.  Follow-up again in 6 to 8 weeks  patient seen and did have the EGD that did show some metaplasia and encouraged her to follow-up again in 3  years    Nonallopathic problems  Decision today to treat with OMT was based on Physical Exam  After verbal consent patient was treated with  ME, FPR techniques in cervical, rib, thoracic, lumbar, and sacral  areas  Patient tolerated the procedure well with improvement in symptoms  Patient given exercises, stretches and lifestyle modifications  See medications in patient instructions if given  Patient will follow up in 4-8 weeks    The above documentation has been reviewed and is accurate and complete Judi Saa, DO          Note: This dictation was prepared with Dragon dictation along with smaller phrase technology. Any transcriptional errors that result from this process are unintentional.

## 2023-04-04 ENCOUNTER — Encounter: Payer: Self-pay | Admitting: Family Medicine

## 2023-04-04 ENCOUNTER — Ambulatory Visit: Payer: Medicare PPO | Admitting: Family Medicine

## 2023-04-04 ENCOUNTER — Telehealth: Payer: Self-pay | Admitting: Family Medicine

## 2023-04-04 VITALS — BP 102/72 | Ht 64.0 in | Wt 109.0 lb

## 2023-04-04 VITALS — BP 111/74 | HR 88 | Temp 97.9°F | Wt 109.2 lb

## 2023-04-04 DIAGNOSIS — E782 Mixed hyperlipidemia: Secondary | ICD-10-CM

## 2023-04-04 DIAGNOSIS — M9904 Segmental and somatic dysfunction of sacral region: Secondary | ICD-10-CM | POA: Diagnosis not present

## 2023-04-04 DIAGNOSIS — Z2989 Encounter for other specified prophylactic measures: Secondary | ICD-10-CM

## 2023-04-04 DIAGNOSIS — M9903 Segmental and somatic dysfunction of lumbar region: Secondary | ICD-10-CM

## 2023-04-04 DIAGNOSIS — K219 Gastro-esophageal reflux disease without esophagitis: Secondary | ICD-10-CM

## 2023-04-04 DIAGNOSIS — M48061 Spinal stenosis, lumbar region without neurogenic claudication: Secondary | ICD-10-CM

## 2023-04-04 DIAGNOSIS — M9901 Segmental and somatic dysfunction of cervical region: Secondary | ICD-10-CM | POA: Diagnosis not present

## 2023-04-04 DIAGNOSIS — R8290 Unspecified abnormal findings in urine: Secondary | ICD-10-CM | POA: Diagnosis not present

## 2023-04-04 DIAGNOSIS — E559 Vitamin D deficiency, unspecified: Secondary | ICD-10-CM

## 2023-04-04 DIAGNOSIS — M810 Age-related osteoporosis without current pathological fracture: Secondary | ICD-10-CM | POA: Diagnosis not present

## 2023-04-04 DIAGNOSIS — M9908 Segmental and somatic dysfunction of rib cage: Secondary | ICD-10-CM

## 2023-04-04 DIAGNOSIS — M9902 Segmental and somatic dysfunction of thoracic region: Secondary | ICD-10-CM

## 2023-04-04 DIAGNOSIS — Z23 Encounter for immunization: Secondary | ICD-10-CM | POA: Diagnosis not present

## 2023-04-04 LAB — POC URINALSYSI DIPSTICK (AUTOMATED)
Bilirubin, UA: NEGATIVE
Blood, UA: NEGATIVE
Glucose, UA: NEGATIVE
Ketones, UA: NEGATIVE
Leukocytes, UA: NEGATIVE
Nitrite, UA: NEGATIVE
Protein, UA: POSITIVE — AB
Spec Grav, UA: 1.015 (ref 1.010–1.025)
Urobilinogen, UA: 0.2 E.U./dL
pH, UA: 7 (ref 5.0–8.0)

## 2023-04-04 MED ORDER — OMEPRAZOLE 40 MG PO CPDR: DELAYED_RELEASE_CAPSULE | ORAL | 3 refills | Status: DC

## 2023-04-04 MED ORDER — DEXAMETHASONE 4 MG PO TABS: ORAL_TABLET | ORAL | 0 refills | Status: DC

## 2023-04-04 NOTE — Patient Instructions (Signed)
See me again in 4-6 weeks

## 2023-04-04 NOTE — Telephone Encounter (Signed)
LM for pt to return call to discuss.

## 2023-04-04 NOTE — Patient Instructions (Signed)

## 2023-04-04 NOTE — Progress Notes (Signed)
Kelsey [name] , 10-Aug-1945, 77 y.o., female MRN: [id number] Patient Care Team    Relationship Specialty Notifications Start End  Natalia Leatherwood, DO PCP - General Family Medicine  11/14/22   Charna Elizabeth, MD Consulting Physician Gastroenterology  12/08/15   Nelson Chimes, MD Consulting Physician Ophthalmology  12/08/15   Judi Saa, DO Consulting Physician Family Medicine  04/07/18   Van Clines, MD Consulting Physician Neurology  01/23/21     Chief Complaint  Patient presents with   Gastroesophageal Reflux     Subjective: Kelsey [name] is a 77 y.o. Pt presents for an OV to discuss  Gastroesophageal reflux disease without esophagitis Reviewed recent EGD with her.  Chronic gastritis appreciated on EGD.  This is consistent with her prior EGDs.  GI had stopped her pantoprazole and switched her to Pepcid.  Since doing so she has had worsening reflux and decreased appetite.  Osteoporosis/vitamin D deficiency Patient being treated for osteoporosis with Prolia injections and vitamin D high-dose once weekly.  Altitude sickness preventative measures Patient has a long trip coming up in December in which she will be gone for 3 and half months.  Part of that time will be spent improved and she hopes to be able to take a day hike up to [facility].  She does experience altitude sickness and is wondering if she can take a medication to help with altitude sickness.     10/03/2022    9:05 AM 06/13/2022   10:58 AM 10/17/2021    8:46 AM 09/06/2021   11:59 AM 05/08/2021    9:36 AM  Depression screen PHQ 2/9  Decreased Interest 0 0 0 0 0  Down, Depressed, Hopeless 0 0 0 0 0  PHQ - 2 Score 0 0 0 0 0    Allergies  Allergen Reactions   Penicillins Rash    Has patient had a PCN reaction causing immediate rash, facial/tongue/throat swelling, SOB or lightheadedness with hypotension: no Has patient had a PCN reaction causing severe rash involving mucus membranes or skin necrosis:  yes Has patient had a PCN reaction that required hospitalization: no Has patient had a PCN reaction occurring within the last 10 years: no If all of the above answers are "NO", then may proceed with Cephalosporin use.    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   Bladder prolapse, female, acquired    Chicken pox as a child   De Quervain's tenosynovitis, left 12/11/2017   Dysuria 09/13/2011   Female bladder prolapse 09/13/2011   Fibrocystic breast 09/14/2011   GERD (gastroesophageal reflux disease)    H/O measles    H/O mumps    History of iron deficiency anemia 12/02/2013   Hyperlipidemia    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    Uterine fibroid 11/2012   See  Dr. Estanislado Pandy note, surgery 11/2012; calcified- multiple   Vaginitis 09/13/2011   Past Surgical History:  Procedure Laterality Date   DILATATION & CURRETTAGE/HYSTEROSCOPY WITH RESECTOCOPE N/A 11/06/2012   Procedure: DILATATION & CURETTAGE/HYSTEROSCOPY WITH RESECTOCOPE;  Surgeon: Esmeralda Arthur, MD;  Location: [facility];  Service: Gynecology;  Laterality: N/A;   HERNIA REPAIR  1992   I & D EXTREMITY Left 03/08/2021   Procedure: IRRIGATION AND DEBRIDEMENT OF THUMB AND ARM;  Surgeon: Betha Loa, MD;  Location: [facility] OR;  Service: Orthopedics;  Laterality: Left;   small intestine hernia  1994   WISDOM TOOTH EXTRACTION     Family History  Problem Relation Age of Onset   Heart disease Mother    Osteoporosis  Mother    Cancer Father        prostate   Osteoporosis Sister    Osteoporosis Sister    Gout Brother    Gout Maternal Grandmother    Heart disease Maternal Grandmother    Alzheimer's disease Maternal Grandfather    Colon cancer Neg Hx    Colon polyps Neg Hx    Esophageal cancer Neg Hx    Rectal cancer Neg Hx    Stomach cancer Neg Hx    Allergies as of 04/04/2023       Reactions   Penicillins Rash   Has patient had a PCN reaction causing immediate rash, facial/tongue/throat swelling, SOB or lightheadedness with hypotension: no Has patient had a PCN reaction causing severe rash involving mucus membranes or skin necrosis: yes Has patient had a PCN reaction that required hospitalization: no Has patient had a PCN reaction occurring within the last 10 years: no If all of the above answers are "NO", then may proceed with Cephalosporin use.   Sulfa Antibiotics Rash   Under arm and thighs        Medication List        Accurate as of April 04, 2023 11:50 AM. If you have any questions, ask your nurse or doctor.          atorvastatin 10 MG tablet Commonly known as: LIPITOR Take 1 tablet (10 mg total) by mouth every evening.   CALCIUM 1200 PO Take 1,200 mg by mouth daily.   dexamethasone 4 MG tablet Commonly known as: DECADRON 1 tab PO every 12 hours with food started night prior to ascent  for doses Started by: Felix Pacini   famotidine 20 MG tablet Commonly known as: Pepcid Take 1 tablet (20 mg total) by mouth 2 (two) times daily.   Glucosamine 750 MG Tabs Take 750 mg by mouth daily.   omeprazole 40 MG capsule Commonly known as: PRILOSEC TAKE 1 CAPSULE DAILY.   Vitamin D (Ergocalciferol) 1.25 MG (50000 UNIT) Caps capsule Commonly known as: DRISDOL Take 1 capsule (50,000 Units total) by mouth every 7 (seven) days.        All past medical history, surgical history, allergies, family history, immunizations andmedications were updated in the EMR today and  reviewed under the history and medication portions of their EMR.     ROS Negative, with the exception of above mentioned in HPI   Objective:  BP 111/74   Pulse 88   Temp 97.9 F (36.6 C)   Wt 109 lb 3.2 oz (49.5 kg)   SpO2 96%   BMI 18.74 kg/m  Body mass index is 18.74 kg/m. Physical Exam Vitals and nursing note reviewed.  Constitutional:      General: She is not in acute distress.  Appearance: Normal appearance. She is not ill-appearing, toxic-appearing or diaphoretic.  HENT:     Head: Normocephalic and atraumatic.  Eyes:     General: No scleral icterus.       Right eye: No discharge.        Left eye: No discharge.     Extraocular Movements: Extraocular movements intact.     Conjunctiva/sclera: Conjunctivae normal.     Pupils: Pupils are equal, round, and reactive to light.  Cardiovascular:     Rate and Rhythm: Normal rate and regular rhythm.     Heart sounds: No murmur heard. Pulmonary:     Effort: Pulmonary effort is normal. No respiratory distress.     Breath sounds: Normal breath sounds. No wheezing, rhonchi or rales.  Musculoskeletal:     Right lower leg: No edema.     Left lower leg: No edema.  Skin:    General: Skin is warm.     Findings: No rash.  Neurological:     Mental Status: She is alert and oriented to person, place, and time. Mental status is at baseline.     Motor: No weakness.     Gait: Gait normal.  Psychiatric:        Mood and Affect: Mood normal.        Behavior: Behavior normal.        Thought Content: Thought content normal.        Judgment: Judgment normal.     No results found. No results found. Results for orders placed or performed in visit on 04/04/23 (from the past 24 hour(s))  POCT Urinalysis Dipstick (Automated)     Status: Abnormal   Collection Time: 04/04/23 11:01 AM  Result Value Ref Range   Color, UA yellow    Clarity, UA clear    Glucose, UA Negative Negative   Bilirubin, UA neg    Ketones, UA neg    Spec Grav, UA  1.015 1.010 - 1.025   Blood, UA neg    pH, UA 7.0 5.0 - 8.0   Protein, UA Positive (A) Negative   Urobilinogen, UA 0.2 0.2 or 1.0 E.U./dL   Nitrite, UA neg    Leukocytes, UA Negative Negative    Assessment/Plan: Kelsey [name] is a 77 y.o. female present for OV for  Urine finding - POCT Urinalysis Dipstick (Automated) Need for influenza vaccination - Flu Vaccine Trivalent High Dose (Fluad)  Gastroesophageal reflux disease without esophagitis Recent EGD with chronic gastritis changes.  Consistent with prior EGD. Can continue Pepcid 20 mg twice daily, prescribed by GI. Symptoms seem to be worsening since removing PPI, encouraged her she can restart the omeprazole 40 mg daily.  Osteoporosis without current pathological fracture/Vitamin D deficiency Vitamin D is monitored closely as well as bone density.  Patient is being treated for osteoporosis and vitamin D is supplemented.  Mixed hyperlipidemia Stable. Continue atorvastatin 10 mg nightly  Altitude sickness preventative measures Unfortunately, she is not a candidate for Diamox use given sulfa allergy and increased sensitivity risk being [place] descent.  She only plans to be at higher elevation for 1-2 days. Short course of dexamethasone twice daily x 4 doses provided.   Influenza vaccine provided today   Reviewed expectations re: course of current medical issues. Discussed self-management of symptoms. Outlined signs and symptoms indicating need for more acute intervention. Patient verbalized understanding and all questions were answered. Patient received an After-Visit Summary.    Orders Placed This Encounter  Procedures   Flu Vaccine  Trivalent High Dose (Fluad)   POCT Urinalysis Dipstick (Automated)   Meds ordered this encounter  Medications   omeprazole (PRILOSEC) 40 MG capsule    Sig: TAKE 1 CAPSULE DAILY.    Dispense:  90 capsule    Refill:  3    Ok to dispense early for upcoming travel   Referral  Orders  No referral(s) requested today     Note is dictated utilizing voice recognition software. Although note has been proof read prior to signing, occasional typographical errors still can be missed. If any questions arise, please do not hesitate to call for verification.   electronically signed by:  Felix Pacini, DO  [facility] - OR

## 2023-04-04 NOTE — Telephone Encounter (Signed)
Please inform Kelsey [name], we cannot prescribe the altitude sickness medication called diamox,  for her because she has a sulfa sensitivity.  A small amount of patients that are allergic to sulfa have a reaction to the altitude sickness medicine.  Also states that this particular medicine has increased sensitivity risk in patients of [place], [place] and [place] descent.  I did call in a different preventive medication for her. It is a ty[pe od steroid that is used to prevent altitude sickness as well.     Please inform patient that her paperwork is also completed and returned to [facility] work basket.

## 2023-04-04 NOTE — Assessment & Plan Note (Signed)
Known degenerative disc disease.  Continue to be active.  No change in medications.  Responds well to more of the muscle energy and the osteopathic manipulation.  Follow-up again in 6 to 8 weeks  patient seen and did have the EGD that did show some metaplasia and encouraged her to follow-up again in 3 years

## 2023-04-05 NOTE — Telephone Encounter (Signed)
Forms given to [facility] for pt pick up

## 2023-04-05 NOTE — Telephone Encounter (Signed)
Spoke with patient regarding results/recommendations.  

## 2023-04-08 ENCOUNTER — Telehealth: Payer: Self-pay

## 2023-04-08 NOTE — Telephone Encounter (Signed)
Patient scheduled appt tomorrow.  She is not sure if she still needs to come in. She is basing this appt on her test results.  Please call [phone]

## 2023-04-08 NOTE — Telephone Encounter (Signed)
Spoke with patient regarding results/recommendations.  

## 2023-04-09 ENCOUNTER — Ambulatory Visit: Payer: Medicare PPO | Admitting: Family Medicine

## 2023-05-01 NOTE — Progress Notes (Unsigned)
[name] [address] [place] [address] Phone: [phone] Subjective:   [name], am serving as a scribe for Dr. Antoine Primas.  I'm seeing this patient by the request  of:  [name], [name], DO  CC: back and neck pain   UVO:[id number]  Kelsey [name] is a 77 y.o. female coming in with complaint of back and neck pain. OMT on 04/04/2023. Patient states same per usual.  Medications patient has been prescribed:   Taking:         Reviewed prior external information including notes and imaging from previsou exam, outside providers and external EMR if available.   As well as notes that were available from care everywhere and other healthcare systems.  Past medical history, social, surgical and family history all reviewed in electronic medical record.  No pertanent information unless stated regarding to the chief complaint.   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    Uterine fibroid 11/2012   See Dr. Estanislado Pandy note, surgery 11/2012; calcified- multiple   Vaginitis 09/13/2011    Allergies  Allergen Reactions   Penicillins Rash    Has patient had a PCN reaction  causing immediate rash, facial/tongue/throat swelling, SOB or lightheadedness with hypotension: no Has patient had a PCN reaction causing severe rash involving mucus membranes or skin necrosis: yes Has patient had a PCN reaction that required hospitalization: no Has patient had a PCN reaction occurring within the last 10 years: no If all of the above answers are "NO", then may proceed with Cephalosporin use.    Sulfa Antibiotics Rash    Under arm and thighs     Review of Systems:  No headache, visual changes, nausea, vomiting, diarrhea, constipation, dizziness, abdominal pain, skin rash, fevers, chills, night sweats, weight loss, swollen lymph nodes, body aches, joint swelling, chest pain, shortness of breath, mood changes. POSITIVE muscle aches  Objective  Blood pressure 104/64, pulse 98, height 5\' 4"  (1.626 m), weight 109 lb (49.4 kg), SpO2 95%.   General: No apparent distress alert and oriented x3 mood and affect normal, dressed appropriately.  HEENT: Pupils equal, extraocular movements intact  Respiratory: Patient's speak in full sentences and does not appear short of breath  Cardiovascular: No lower extremity edema, non tender, no erythema  Gait MSK:  Back significant loss of lordosis noted.  Tightness noted in the neck.  Patient does have significant loss of lordosis of the back that does cause some tightness and worsening pain with extension greater than 5 degrees  Osteopathic findings  T3 extended rotated and side bent right inhaled rib  T9 extended rotated and side bent left L2 flexed rotated and side bent right Sacrum right on right       Assessment and Plan:  No problem-specific Assessment & Plan notes found for this encounter.    Nonallopathic problems  Decision today to treat with OMT was based on Physical Exam  After verbal consent patient was treated with HVLA, ME, FPR techniques in  rib, thoracic, lumbar, and sacral  areas  Patient tolerated the procedure  well with improvement in symptoms  Patient given exercises, stretches and lifestyle modifications  See medications in patient instructions if given  Patient will follow up in 4-8 weeks    The above documentation has been reviewed and is accurate and complete Judi Saa, DO          Note: This dictation was prepared with Dragon dictation along with smaller phrase technology. Any transcriptional errors that result from this process are unintentional.

## 2023-05-02 ENCOUNTER — Ambulatory Visit: Payer: Medicare PPO | Admitting: Family Medicine

## 2023-05-02 ENCOUNTER — Encounter: Payer: Self-pay | Admitting: Family Medicine

## 2023-05-02 VITALS — BP 104/64 | HR 98 | Ht 64.0 in | Wt 109.0 lb

## 2023-05-02 DIAGNOSIS — M9908 Segmental and somatic dysfunction of rib cage: Secondary | ICD-10-CM

## 2023-05-02 DIAGNOSIS — M9903 Segmental and somatic dysfunction of lumbar region: Secondary | ICD-10-CM | POA: Diagnosis not present

## 2023-05-02 DIAGNOSIS — M9904 Segmental and somatic dysfunction of sacral region: Secondary | ICD-10-CM | POA: Diagnosis not present

## 2023-05-02 DIAGNOSIS — M48061 Spinal stenosis, lumbar region without neurogenic claudication: Secondary | ICD-10-CM | POA: Diagnosis not present

## 2023-05-02 DIAGNOSIS — M9902 Segmental and somatic dysfunction of thoracic region: Secondary | ICD-10-CM

## 2023-05-02 NOTE — Patient Instructions (Signed)
Good to see you! Try to add this to good feet See you again in 5-6 weeks

## 2023-05-02 NOTE — Assessment & Plan Note (Addendum)
Degenerative disc disease.  Discussed icing regimen and home exercises, discussed which activities to do and which ones to avoid.  Increase activity slowly over the course of next several weeks.  No significant change in medications.  Follow-up again 6 weeks worsening pain we can consider epidurals again no change in medications

## 2023-05-09 ENCOUNTER — Other Ambulatory Visit: Payer: Self-pay

## 2023-05-10 ENCOUNTER — Encounter: Payer: Self-pay | Admitting: Family Medicine

## 2023-05-10 ENCOUNTER — Ambulatory Visit: Payer: Medicare PPO | Admitting: Family Medicine

## 2023-05-10 VITALS — BP 112/58 | HR 90 | Temp 97.2°F | Ht 64.0 in | Wt 109.4 lb

## 2023-05-10 DIAGNOSIS — Z605 Target of (perceived) adverse discrimination and persecution: Secondary | ICD-10-CM | POA: Diagnosis not present

## 2023-05-10 DIAGNOSIS — R41840 Attention and concentration deficit: Secondary | ICD-10-CM

## 2023-05-10 MED ORDER — DOXYCYCLINE HYCLATE 100 MG PO TABS: 100.0000 mg | ORAL_TABLET | Freq: Two times a day (BID) | ORAL | 0 refills | Status: DC

## 2023-05-10 NOTE — Patient Instructions (Signed)
No follow-ups on file.   I will refer you to therapist. Thank you for the plants.     Great to see you today.  I have refilled the medication(s) we provide.   If labs were collected or images ordered, we will inform you of  results once we have received them and reviewed. We will contact you either by echart message, or telephone call.  Please give ample time to the testing facility, and our office to run,  receive and review results. Please do not call inquiring of results, even if you can see them in your chart. We will contact you as soon as we are able. If it has been over 1 week since the test was completed, and you have not yet heard from [place], then please call us.    - echart message- for normal results that have been seen by the patient already.   - telephone call: abnormal results or if patient has not viewed results in their echart.  If a referral to a specialist was entered for you, please call us in 2 weeks if you have not heard from the specialist office to schedule.

## 2023-05-10 NOTE — Progress Notes (Signed)
Kelsey [name] , March 10, 1946, 77 y.o., female MRN: [id number] Patient Care Team    Relationship Specialty Notifications Start End  Kelsey Leatherwood, DO PCP - General Family Medicine  11/14/22   Kelsey Elizabeth, MD Consulting Physician Gastroenterology  12/08/15   Kelsey Chimes, MD Consulting Physician Ophthalmology  12/08/15   Kelsey Saa, DO Consulting Physician Family Medicine  04/07/18   Kelsey Clines, MD Consulting Physician Neurology  01/23/21     Chief Complaint  Patient presents with   lack of focus     Subjective: Kelsey [name] is a 77 y.o. Pt presents for an OV with complaints she feels is lack of attention or focus.  She gives examples of when she is gardening, she will get caught up in her gardening and lose track of time.  She then does not have enough time for the other things she has to do in the day.  She feels she is not allocating her time appropriately for her tasks, but gets lost in the task itself. She also discusses events in which she feels peers are not listening to, or not asking her, her opinions.  She has a few particular examples she describes today in which people were publicly asked opinions or to speak, and they went straight down the line and asked everybody except for her, they skipped her.  She reports she becomes irritated and sometimes will make an off comment about being skipped, or not being listened to, and she does not like that it affects her that way or the reaction she has.     10/03/2022    9:05 AM 06/13/2022   10:58 AM 10/17/2021    8:46 AM 09/06/2021   11:59 AM 05/08/2021    9:36 AM  Depression screen PHQ 2/9  Decreased Interest 0 0 0 0 0  Down, Depressed, Hopeless 0 0 0 0 0  PHQ - 2 Score 0 0 0 0 0    Allergies  Allergen Reactions   Penicillins Rash    Has patient had a PCN reaction causing immediate rash, facial/tongue/throat swelling, SOB or lightheadedness with hypotension: no Has patient had a PCN reaction causing severe  rash involving mucus membranes or skin necrosis: yes Has patient had a PCN reaction that required hospitalization: no Has patient had a PCN reaction occurring within the last 10 years: no If all of the above answers are "NO", then may proceed with Cephalosporin use.    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   Bladder prolapse, female, acquired    Chicken pox as a child   De Quervain's tenosynovitis, left 12/11/2017   Dysuria 09/13/2011   Female bladder prolapse 09/13/2011   Fibrocystic breast 09/14/2011   GERD (gastroesophageal reflux disease)    H/O measles    H/O mumps    History of iron deficiency anemia 12/02/2013   Hyperlipidemia    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    Uterine fibroid 11/2012   See Dr. Estanislado Pandy note, surgery 11/2012; calcified- multiple   Vaginitis 09/13/2011   Past Surgical History:  Procedure Laterality Date   DILATATION & CURRETTAGE/HYSTEROSCOPY WITH RESECTOCOPE N/A 11/06/2012   Procedure: DILATATION & CURETTAGE/HYSTEROSCOPY WITH RESECTOCOPE;  Surgeon: Esmeralda Arthur, MD;  Location: [facility];  Service: Gynecology;  Laterality: N/A;   HERNIA REPAIR  1992   I & D EXTREMITY Left 03/08/2021   Procedure: IRRIGATION AND DEBRIDEMENT OF THUMB AND ARM;  Surgeon: Betha Loa, MD;  Location: [facility] OR;  Service: Orthopedics;  Laterality: Left;   small intestine hernia  1994   WISDOM TOOTH EXTRACTION     Family History  Problem Relation Age of  Onset   Heart disease Mother    Osteoporosis Mother    Cancer Father        prostate   Osteoporosis Sister    Osteoporosis Sister    Gout Brother    Gout Maternal Grandmother    Heart disease Maternal Grandmother    Alzheimer's disease Maternal Grandfather    Colon cancer Neg Hx    Colon polyps Neg Hx    Esophageal cancer Neg Hx    Rectal cancer Neg Hx    Stomach cancer Neg Hx    Allergies as of 05/10/2023       Reactions   Penicillins Rash   Has patient had a PCN reaction causing immediate rash, facial/tongue/throat swelling, SOB or lightheadedness with hypotension: no Has patient had a PCN reaction causing severe rash involving mucus membranes or skin necrosis: yes Has patient had a PCN reaction that required hospitalization: no Has patient had a PCN reaction occurring within the last 10 years: no If all of the above answers are "NO", then may proceed with Cephalosporin use.   Sulfa Antibiotics Rash   Under arm and thighs        Medication List        Accurate as of May 10, 2023  4:10 PM. If you have any questions, ask your nurse or doctor.          atorvastatin 10 MG tablet Commonly known as: LIPITOR Take 1 tablet (10 mg total) by mouth every evening.   CALCIUM 1200 PO Take 1,200 mg by mouth daily.   dexamethasone 4 MG tablet Commonly known as: DECADRON 1 tab PO every 12 hours with food started night prior to ascent  for doses   doxycycline 100 MG tablet Commonly known as: VIBRA-TABS Take 1 tablet (100 mg total) by mouth 2 (two) times daily. Started by: Felix Pacini   famotidine 20 MG tablet Commonly known as: Pepcid Take 1 tablet (20 mg total) by mouth 2 (two) times daily.   omeprazole 40 MG capsule Commonly known as: PRILOSEC TAKE 1 CAPSULE DAILY.   Vitamin D (Ergocalciferol) 1.25 MG (50000 UNIT) Caps capsule Commonly known as: DRISDOL Take 1 capsule (50,000 Units total) by mouth every 7 (seven) days.        All past medical history,  surgical history, allergies, family history, immunizations andmedications were updated in the EMR today and reviewed under the history and medication portions of their EMR.     ROS Negative, with the exception of above mentioned in HPI   Objective:  BP (!) 112/58   Pulse 90   Temp (!) 97.2 F (36.2 C)   Ht 5\' 4"  (1.626 m)  Wt 109 lb 6.4 oz (49.6 kg)   SpO2 96%   BMI 18.78 kg/m  Body mass index is 18.78 kg/m. Physical Exam Vitals and nursing note reviewed.  Constitutional:      General: She is not in acute distress.    Appearance: Normal appearance. She is normal weight. She is not ill-appearing or toxic-appearing.  HENT:     Head: Normocephalic and atraumatic.  Eyes:     General: No scleral icterus.       Right eye: No discharge.        Left eye: No discharge.     Extraocular Movements: Extraocular movements intact.     Conjunctiva/sclera: Conjunctivae normal.     Pupils: Pupils are equal, round, and reactive to light.  Skin:    Findings: No rash.  Neurological:     Mental Status: She is alert and oriented to person, place, and time. Mental status is at baseline.     Motor: No weakness.     Coordination: Coordination normal.     Gait: Gait normal.  Psychiatric:        Attention and Perception: Attention and perception normal.        Mood and Affect: Mood normal.        Speech: Speech normal.        Behavior: Behavior is hyperactive.        Thought Content: Thought content normal.        Cognition and Memory: Cognition normal. She does not exhibit impaired recent memory or impaired remote memory.        Judgment: Judgment normal.      No results found. No results found. No results found for this or any previous visit (from the past 24 hour(s)).  Assessment/Plan: FAYNE MCGUFFEE is a 77 y.o. female present for OV for  Attention and concentration deficit Looking for coping mechanisms to help her with her task/time allocation - Ambulatory referral to  Psychology  Racial discrimination Patient has examples of multiple reoccurring incidences where she was looked over, not listened to etc. secondary to her being a [place] female.  Would like coping mechanisms to help her work on her personal reactions and responses to others treating her this way. - Ambulatory referral to Psychology  Reviewed expectations re: course of current medical issues. Discussed self-management of symptoms. Outlined signs and symptoms indicating need for more acute intervention. Patient verbalized understanding and all questions were answered. Patient received an After-Visit Summary.    Orders Placed This Encounter  Procedures   Ambulatory referral to Psychology   Meds ordered this encounter  Medications   doxycycline (VIBRA-TABS) 100 MG tablet    Sig: Take 1 tablet (100 mg total) by mouth 2 (two) times daily.    Dispense:  20 tablet    Refill:  0   Referral Orders         Ambulatory referral to Psychology       Note is dictated utilizing voice recognition software. Although note has been proof read prior to signing, occasional typographical errors still can be missed. If any questions arise, please do not hesitate to call for verification.   electronically signed by:  Felix Pacini, DO  [facility] - [place]

## 2023-05-29 ENCOUNTER — Telehealth: Payer: Self-pay | Admitting: Family Medicine

## 2023-05-29 NOTE — Telephone Encounter (Signed)
  omeprazole (PRILOSEC) 40 MG capsule is needing to be filled early due to Kelsey [name] pending travel   It is approved to dispense early due to travel however she states that [facility] needs PA for this.

## 2023-05-30 ENCOUNTER — Telehealth: Payer: Self-pay

## 2023-05-30 ENCOUNTER — Other Ambulatory Visit (HOSPITAL_COMMUNITY): Payer: Self-pay

## 2023-05-31 NOTE — Progress Notes (Signed)
[name] [address] [place] [address] Phone: [phone] Subjective:   [name], am serving as a scribe for Dr. Antoine Primas.  I'm seeing this patient by the request  of:  [name], [name], DO  CC: back and neck pain follow up   [id number]  Kelsey [name] is a 77 y.o. female coming in with complaint of back and neck pain. OMT on 05/02/2023. Patient states not doing well. Wednesday had stabbing pain in LLQ. Having charlie horse all over more frequently. Neck pain on R side. Can't tilt head, but can rotate.  Medications patient has been prescribed:   Taking:         Reviewed prior external information including notes and imaging from previsou exam, outside providers and external EMR if available.   As well as notes that were available from care everywhere and other healthcare systems.  Past medical history, social, surgical and family history all reviewed in electronic medical record.  No pertanent information unless stated regarding to the chief complaint.   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    Uterine fibroid 11/2012   See Dr. Estanislado Pandy note, surgery  11/2012; calcified- multiple   Vaginitis 09/13/2011    Allergies  Allergen Reactions   Penicillins Rash    Has patient had a PCN reaction causing immediate rash, facial/tongue/throat swelling, SOB or lightheadedness with hypotension: no Has patient had a PCN reaction causing severe rash involving mucus membranes or skin necrosis: yes Has patient had a PCN reaction that required hospitalization: no Has patient had a PCN reaction occurring within the last 10 years: no If all of the above answers are "NO", then may proceed with Cephalosporin use.    Sulfa Antibiotics Rash    Under arm and thighs     Review of Systems:  No headache, visual changes, nausea, vomiting, diarrhea, constipation, dizziness, skin rash, fevers, chills, night sweats, weight loss, swollen lymph nodes, body aches, joint swelling, chest pain, shortness of breath, mood changes. POSITIVE muscle aches, abdominal pain  Objective  Blood pressure 98/62, pulse 92, height 5\' 4"  (1.626 m), weight 106 lb (48.1 kg), SpO2 97%.   General: No apparent distress alert and oriented x3 mood and affect normal, dressed appropriately.  HEENT: Pupils equal, extraocular movements intact  Respiratory: Patient's speak in full sentences and does not appear short of breath  Cardiovascular: No lower extremity edema, non tender, no erythema  MSK:  Back significant loss of lordosis noted.  Limited range of motion especially with extension of the back.  Negative straight leg test noted. Fullness in  the abdominal area on the left side.  Minor tenderness noted.  No masses felt. Osteopathic findings  C2 flexed rotated and side bent right C5 flexed rotated and side bent left T3 extended rotated and side bent right inhaled rib T9 extended rotated and side bent left L1 flexed rotated and side bent right L5 flexed rotated and side bent left. 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  Discussed which activities to do and which ones to avoid.  Increase activity slowly over the course of next several days.  Do believe the patient may have had a diverticulosis or diverticulitis flare and given antibiotics with patient is leaving the country for the next 3 months.  Patient knows worsening symptoms to seek medical attention otherwise.  Will get labs as well as a KUB to further evaluate.    Nonallopathic problems  Decision today to treat with OMT was based on Physical Exam  After verbal consent patient was treated with HVLA, ME, FPR techniques in cervical, rib, thoracic, lumbar, and sacral  areas  Patient tolerated the procedure well with improvement in symptoms  Patient given exercises, stretches and lifestyle modifications  See medications in patient instructions if given  Patient will follow up in 4-8 weeks     The above documentation has been reviewed and is accurate and complete Judi Saa, DO         Note: This dictation was prepared with Dragon dictation along with smaller phrase technology. Any transcriptional errors that result from this process are unintentional.

## 2023-06-04 ENCOUNTER — Ambulatory Visit (INDEPENDENT_AMBULATORY_CARE_PROVIDER_SITE_OTHER): Payer: Medicare PPO | Admitting: Licensed Clinical Social Worker

## 2023-06-04 DIAGNOSIS — F909 Attention-deficit hyperactivity disorder, unspecified type: Secondary | ICD-10-CM | POA: Diagnosis not present

## 2023-06-04 DIAGNOSIS — F419 Anxiety disorder, unspecified: Secondary | ICD-10-CM

## 2023-06-04 DIAGNOSIS — F901 Attention-deficit hyperactivity disorder, predominantly hyperactive type: Secondary | ICD-10-CM | POA: Insufficient documentation

## 2023-06-04 NOTE — Progress Notes (Addendum)
[facility] Counselor Initial Adult Exam  Name: Kelsey [name] Date: 06/04/2023 MRN: [id number] DOB: 1946/01/16 PCP: Felix Pacini A, DO  Time Spent: 11:13  am - 12:03 pm : 50 Minutes  Guardian/Payee:  Self/Adult    Paperwork requested: No   Reason for Visit /Presenting Problem: ADHD, Focus, Reactive behaviors and adult temper tantrums  Mental Status Exam: Appearance:   Well Groomed     Behavior:  Sharing  Motor:  Normal  Speech/Language:   Normal Rate  Affect:  Appropriate  Mood:  anxious  Thought process:  normal  Thought content:    WNL  Sensory/Perceptual disturbances:    WNL  Orientation:  oriented to person, place, and time/date  Attention:  Good  Concentration:  Good  Memory:  WNL  Fund of knowledge:   Good  Insight:    Good  Judgment:   Good  Impulse Control:  Good   Reported Symptoms:  Impulsive behavior, lashing out verbally, reactive   Risk Assessment: Danger to Self:  No Self-injurious Behavior: No Danger to Others: No Duty to Warn:no Physical Aggression / Violence:No  Access to Firearms a concern: No  Gang Involvement:No  Patient / guardian was educated about steps to take if suicide or homicide risk level increases between visits: yes While future psychiatric events cannot be accurately predicted, the patient does not currently require acute inpatient psychiatric care and does not currently meet [facility] involuntary commitment criteria.  Substance Abuse History: Current substance abuse: No     Caffeine: None Tobacco:None Alcohol:None Substance WUJ:[id number]  Past Psychiatric History:   No previous psychological problems have been observed Outpatient Providers:N/A History of Psych Hospitalization: No  Psychological Testing:  N/A    Abuse History:  Victim of: No.,  N/A    Report needed: No. Victim of Neglect:No. Perpetrator of  N/A   Witness / Exposure to Domestic Violence: No   Protective Services Involvement: No   Witness to [employer] Violence:  No   Family History:  Family History  Problem Relation Age of Onset   Heart disease Mother    Osteoporosis Mother    Cancer Father        prostate   Osteoporosis Sister    Osteoporosis Sister    Gout Brother    Gout Maternal Grandmother    Heart disease Maternal Grandmother    Alzheimer's disease Maternal Grandfather    Colon cancer Neg Hx    Colon polyps Neg Hx    Esophageal cancer Neg Hx    Rectal cancer Neg Hx    Stomach cancer Neg Hx     Living situation: the patient lives alone  Sexual Orientation: Straight  Relationship Status: widowed  Name of spouse / other:N/A If a parent, number of children / ages:3 Step daughter one adopted over age adults  Support Systems: friends  Surveyor, quantity Stress:  No   Income/Employment/Disability: Dance movement psychotherapist and Occupational psychologist Service: No   Educational History: Education: Risk manager: Buddhist  Any cultural differences that may affect / interfere with treatment:  not applicable   Recreation/Hobbies: gardening  Stressors: Health problems    Strengths: Charity fundraiser  Barriers:  None   Legal History: Pending legal issue / charges: The patient has no significant history of legal issues. 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    Uterine fibroid 11/2012   See Dr. Estanislado Pandy note, surgery 11/2012; calcified- multiple   Vaginitis 09/13/2011    Past Surgical History:  Procedure Laterality Date   DILATATION & CURRETTAGE/HYSTEROSCOPY WITH RESECTOCOPE N/A 11/06/2012   Procedure: DILATATION & CURETTAGE/HYSTEROSCOPY WITH RESECTOCOPE;  Surgeon: Esmeralda Arthur, MD;  Location: [facility];  Service: Gynecology;  Laterality: N/A;   HERNIA REPAIR  1992   I & D EXTREMITY Left 03/08/2021   Procedure: IRRIGATION AND DEBRIDEMENT OF THUMB AND ARM;  Surgeon: Betha Loa, MD;  Location: [facility] OR;  Service: Orthopedics;  Laterality: Left;   small intestine hernia  1994   WISDOM TOOTH EXTRACTION      Medications: Current Outpatient Medications  Medication Sig Dispense Refill   atorvastatin (LIPITOR) 10 MG tablet Take 1 tablet (10 mg total) by mouth every evening. 90 tablet 3   Calcium Carbonate-Vit D-Min (CALCIUM 1200 PO) Take 1,200 mg by mouth daily.     dexamethasone (DECADRON) 4 MG tablet 1 tab PO every 12 hours with food started night prior to ascent  for doses (Patient not taking: Reported on 05/10/2023) 4 tablet 0   doxycycline (VIBRA-TABS) 100 MG tablet Take 1 tablet (100 mg total) by mouth 2 (two) times daily. 20 tablet 0   famotidine (PEPCID) 20 MG tablet Take 1 tablet (20 mg total) by mouth 2 (two) times daily. 180 tablet 3   omeprazole (PRILOSEC) 40 MG capsule TAKE 1 CAPSULE DAILY. (Patient not taking: Reported on 05/10/2023) 90 capsule 3   Vitamin D, Ergocalciferol, (DRISDOL) 1.25 MG (50000 UNIT) CAPS capsule Take 1 capsule (50,000 Units total) by mouth every 7 (seven) days. 12 capsule 3   No current facility-administered medications for this visit.    Allergies  Allergen  Reactions   Penicillins Rash    Has patient had a PCN reaction causing immediate rash, facial/tongue/throat swelling, SOB or lightheadedness with hypotension: no Has patient had a PCN reaction causing severe rash involving mucus membranes or skin necrosis: yes Has patient had a PCN reaction that required hospitalization: no Has patient had a PCN reaction occurring within the last 10 years: no If all of the above answers are "NO", then may proceed with Cephalosporin use.    Sulfa Antibiotics Rash    Under arm and thighs    Diagnoses:  Anxiety, ADHD  Psychiatric Treatment: No   Plan of Care: Outpatient Therapy, Patient reported will only be able to do one session next week and then will be on a 3 month long cruise to return at the end of March 2025  Narrative:  Kelsey [name] participated from office with therapist and consented to treatment. We reviewed the limits of confidentiality prior  to the start of the evaluation. Kelsey [name] expressed understanding and agreement to proceed. Patient was almost 15 minutes late and got turned around on the location and went to the wrong place.  They were a bit frantic and apologetic about neing late and respecting time.  Patient was oriented to place and time and very courteous.  Committed to making necessary changes however their 3 month long cruise will put a halt in services as they will not be back until 09/2023.  Open to goal setting for next session and making use of the time we will have before they leave.  Ref by Carolyne Fiscal, DO    A follow-up was scheduled to create a treatment plan and begin treatment. Therapist answered  and all questions during the evaluation and contact information was provided. Client will not be able to enter into full treatment goal due to leaving the country.  Anselmo Pickler, [facility]

## 2023-06-05 ENCOUNTER — Telehealth: Payer: Self-pay | Admitting: Family Medicine

## 2023-06-05 NOTE — Telephone Encounter (Signed)
Initial Comment Caller states she is calling for herself. Since last night, she has been having left lower stomach pain. She says it is an on and off pain, it is not consistent. Translation No Nurse Assessment Nurse: Edson Snowball, RN, Alice Date/Time (Eastern Time): 06/05/2023 3:25:56 PM Confirm and document reason for call. If symptomatic, describe symptoms. ---caller states since last night they've been having sx of left upper abdominal pain that comes and goes. Does the patient have any new or worsening symptoms? ---Yes Will a triage be completed? ---Yes Related visit to physician within the last 2 weeks? ---No Does the PT have any chronic conditions? (i.e. diabetes, asthma, this includes High risk factors for pregnancy, etc.) ---Yes List chronic conditions. ---osteoporosis, high cholesterol, GERD, arthritis Is this a behavioral health or substance abuse call? ---No Guidelines Guideline Title Affirmed Question Affirmed Notes Nurse Date/Time Lamount Cohen Time) Abdominal Pain - Upper Abdominal pain Jolyne Loa 06/05/2023 3:33:48 PM Disp. Time Lamount Cohen Time) Disposition Final User 06/05/2023 3:43:19 PM Home Care Yes Edson Snowball RN, Alice Final Disposition 06/05/2023 3:43:19 PM Home Care Yes Edson Snowball, RN, Alice PLEASE NOTE: All timestamps contained within this report are represented as [place] Standard Time. CONFIDENTIALTY NOTICE: This fax transmission is intended only for the addressee. It contains information that is legally privileged, confidential or otherwise protected from use or disclosure. If you are not the intended recipient, you are strictly prohibited from reviewing, disclosing, copying using or disseminating any of this information or taking any action in reliance on or regarding this information. If you have received this fax in error, please notify us immediately by telephone so that we can arrange for its return to [place]. Phone: [phone], Toll-Free: [phone], Fax:  [phone] Page: 2 of 2 Call Id: [id number] Caller Disagree/Comply Comply Caller Understands Yes PreDisposition Call Doctor Care Advice Given Per Guideline HOME CARE: * You should be able to treat this at home. * It doesn't sound like a serious stomachache. * Here is some care advice that should help. * Antacids are medicines that help decrease (neutralize) acid in your stomach. * You can take them WHEN YOU ARE HAVING SYMPTOMS or if you think you are going to have them soon (such as at bedtime). * Dose: Take 1 tablespoon (15 ml) of liquid antacid (such as Maalox, Mylanta). * Sip small amounts at a time, until you feel better and the pain is gone. * Drink clear fluids only (such as water, flat soft drinks or half-strength Gatorade). CALL BACK IF: * You become worse CARE ADVICE given per Abdominal Pain - Upper (Adult) guideline. * With harmless causes, the pain is usually better or goes away within 2 hours. Comments User: Rodman Key, RN Date/Time Lamount Cohen Time): 06/05/2023 3:31:05 PM pain gets to 7 or 8/10 when present. occurred 5 times today User: Rodman Key, RN Date/Time Lamount Cohen Time): 06/05/2023 3:37:58 PM caller clarified sx started 15 hours ago

## 2023-06-05 NOTE — Telephone Encounter (Signed)
Pt was called based on sxs expressed in appt notes for 12.4.24 with Dr. Patsy Lager per Angela's instruction.  FYI: This call has been transferred to triage nurse: the Triage Nurse. Once the result note has been entered staff can address the message at that time.  Patient called in with the following symptoms:  Red Word:abdominal pain   Please advise at Mobile [phone] (mobile)  Message is routed to Provider Pool.

## 2023-06-09 NOTE — Progress Notes (Unsigned)
[facility] [address], [address] [place], [place] [address] [phone]  Date:  06/12/2023   Name:  Kelsey [name]   DOB:  27-Jan-1946   MRN:  [id number]  PCP:  Natalia Leatherwood, DO    Chief Complaint: No chief complaint on file.   History of Present Illness:  Kelsey [name] is a 77 y.o. very pleasant female patient who presents with the following:  Patient seen today with concern of abdominal pain Primary care provider is Dr. Hollace Kinnier as I did see her once previously, summer 2023 History of hyperlipidemia, GERD, degenerative spine disease and arthritis, osteoporosis  Today patient notes  Patient Active Problem List   Diagnosis Date Noted   Attention deficit hyperactivity disorder (ADHD), predominantly hyperactive type 06/04/2023   Cervical spondylosis 11/07/2022   Stenosis of intervertebral foramina 11/07/2022   Closed fracture of transverse process of cervical vertebra (HCC) 02/15/2022   Primary osteoarthritis of both hands 05/08/2021   Trigger finger, acquired 01/25/2021   Degenerative lumbar spinal stenosis 08/19/2018   CMC arthritis 03/13/2018   Nonallopathic lesion of lumbosacral region 01/31/2017   Nonallopathic lesion of sacral region 01/03/2017   Nonallopathic lesion of thoracic region 01/03/2017   Cervical somatic dysfunction 12/07/2016   Pelvic somatic dysfunction 12/07/2016   SI (sacroiliac) joint dysfunction 12/07/2016   Leg length discrepancy 01/25/2014   Hyperlipemia 12/02/2013   Vitamin D deficiency 12/03/2012   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   GERD (gastroesophageal reflux disease)    Osteoporosis     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   Bladder prolapse, female, acquired    Chicken pox as a child   De Quervain's tenosynovitis, left 12/11/2017   Dysuria 09/13/2011    Female bladder prolapse 09/13/2011   Fibrocystic breast 09/14/2011   GERD (gastroesophageal reflux disease)    H/O measles    H/O mumps    History of iron deficiency anemia 12/02/2013   Hyperlipidemia    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    Uterine fibroid 11/2012   See Dr. Estanislado Pandy note, surgery 11/2012; calcified- multiple   Vaginitis 09/13/2011    Past Surgical History:  Procedure Laterality Date   DILATATION & CURRETTAGE/HYSTEROSCOPY WITH RESECTOCOPE N/A 11/06/2012   Procedure: DILATATION & CURETTAGE/HYSTEROSCOPY WITH RESECTOCOPE;  Surgeon: Esmeralda Arthur, MD;  Location: [facility];  Service: Gynecology;  Laterality: N/A;   HERNIA REPAIR  1992   I & D EXTREMITY Left 03/08/2021   Procedure: IRRIGATION AND DEBRIDEMENT OF THUMB AND ARM;  Surgeon: Betha Loa, MD;  Location: [facility] OR;  Service: Orthopedics;  Laterality: Left;   small intestine hernia  1994   WISDOM TOOTH EXTRACTION      Social History   Tobacco Use   Smoking status: Former    Current packs/day: 0.00    Average packs/day: 0.3 packs/day for 10.0 years (3.0 ttl pk-yrs)    Types: Cigarettes    Start date: 07/09/1968    Quit date: 07/09/1978    Years since  quitting: 44.9   Smokeless tobacco: Never  Vaping Use   Vaping status: Never Used  Substance Use Topics   Alcohol use: Yes    Comment: 1-2 glasses of wine daily and a 12 oz beer daily   Drug use: No    Family History  Problem Relation Age of Onset   Heart disease Mother    Osteoporosis Mother    Cancer Father        prostate   Osteoporosis Sister    Osteoporosis Sister    Gout Brother    Gout Maternal Grandmother    Heart disease Maternal Grandmother    Alzheimer's disease Maternal  Grandfather    Colon cancer Neg Hx    Colon polyps Neg Hx    Esophageal cancer Neg Hx    Rectal cancer Neg Hx    Stomach cancer Neg Hx     Allergies  Allergen Reactions   Penicillins Rash    Has patient had a PCN reaction causing immediate rash, facial/tongue/throat swelling, SOB or lightheadedness with hypotension: no Has patient had a PCN reaction causing severe rash involving mucus membranes or skin necrosis: yes Has patient had a PCN reaction that required hospitalization: no Has patient had a PCN reaction occurring within the last 10 years: no If all of the above answers are "NO", then may proceed with Cephalosporin use.    Sulfa Antibiotics Rash    Under arm and thighs    Medication list has been reviewed and updated.  Current Outpatient Medications on File Prior to Visit  Medication Sig Dispense Refill   atorvastatin (LIPITOR) 10 MG tablet Take 1 tablet (10 mg total) by mouth every evening. 90 tablet 3   Calcium Carbonate-Vit D-Min (CALCIUM 1200 PO) Take 1,200 mg by mouth daily.     dexamethasone (DECADRON) 4 MG tablet 1 tab PO every 12 hours with food started night prior to ascent  for doses (Patient not taking: Reported on 05/10/2023) 4 tablet 0   doxycycline (VIBRA-TABS) 100 MG tablet Take 1 tablet (100 mg total) by mouth 2 (two) times daily. 20 tablet 0   famotidine (PEPCID) 20 MG tablet Take 1 tablet (20 mg total) by mouth 2 (two) times daily. 180 tablet 3   omeprazole (PRILOSEC) 40 MG capsule TAKE 1 CAPSULE DAILY. (Patient not taking: Reported on 05/10/2023) 90 capsule 3   Vitamin D, Ergocalciferol, (DRISDOL) 1.25 MG (50000 UNIT) CAPS capsule Take 1 capsule (50,000 Units total) by mouth every 7 (seven) days. 12 capsule 3   No current facility-administered medications on file prior to visit.    Review of Systems:  As per HPI- otherwise negative.   Physical Examination: There were no vitals filed for this visit. There were no vitals filed for this visit. There is  no height or weight on file to calculate BMI. Ideal Body Weight:    GEN: no acute distress. HEENT: Atraumatic, Normocephalic.  Ears and Nose: No external deformity. CV: RRR, No M/G/R. No JVD. No thrill. No extra heart sounds. PULM: CTA B, no wheezes, crackles, rhonchi. No retractions. No resp. distress. No accessory muscle use. ABD: S, NT, ND, +BS. No rebound. No HSM. EXTR: No c/c/e PSYCH: Normally interactive. Conversant.    Assessment and Plan: ***  Signed Abbe Amsterdam, MD

## 2023-06-10 ENCOUNTER — Ambulatory Visit (INDEPENDENT_AMBULATORY_CARE_PROVIDER_SITE_OTHER): Payer: Medicare PPO

## 2023-06-10 ENCOUNTER — Encounter: Payer: Self-pay | Admitting: Family Medicine

## 2023-06-10 ENCOUNTER — Ambulatory Visit (INDEPENDENT_AMBULATORY_CARE_PROVIDER_SITE_OTHER): Payer: Medicare PPO | Admitting: Family Medicine

## 2023-06-10 VITALS — BP 98/62 | HR 92 | Ht 64.0 in | Wt 106.0 lb

## 2023-06-10 DIAGNOSIS — M9902 Segmental and somatic dysfunction of thoracic region: Secondary | ICD-10-CM

## 2023-06-10 DIAGNOSIS — R109 Unspecified abdominal pain: Secondary | ICD-10-CM | POA: Diagnosis not present

## 2023-06-10 DIAGNOSIS — M2550 Pain in unspecified joint: Secondary | ICD-10-CM | POA: Diagnosis not present

## 2023-06-10 DIAGNOSIS — M533 Sacrococcygeal disorders, not elsewhere classified: Secondary | ICD-10-CM | POA: Diagnosis not present

## 2023-06-10 DIAGNOSIS — R1032 Left lower quadrant pain: Secondary | ICD-10-CM

## 2023-06-10 DIAGNOSIS — M9904 Segmental and somatic dysfunction of sacral region: Secondary | ICD-10-CM | POA: Diagnosis not present

## 2023-06-10 DIAGNOSIS — M9901 Segmental and somatic dysfunction of cervical region: Secondary | ICD-10-CM

## 2023-06-10 DIAGNOSIS — M9903 Segmental and somatic dysfunction of lumbar region: Secondary | ICD-10-CM | POA: Diagnosis not present

## 2023-06-10 DIAGNOSIS — M9908 Segmental and somatic dysfunction of rib cage: Secondary | ICD-10-CM

## 2023-06-10 DIAGNOSIS — K5900 Constipation, unspecified: Secondary | ICD-10-CM | POA: Diagnosis not present

## 2023-06-10 HISTORY — DX: Left lower quadrant pain: R10.32

## 2023-06-10 LAB — CBC WITH DIFFERENTIAL/PLATELET
Basophils Absolute: 0 10*3/uL (ref 0.0–0.1)
Basophils Relative: 0.6 % (ref 0.0–3.0)
Eosinophils Absolute: 0 10*3/uL (ref 0.0–0.7)
Eosinophils Relative: 1 % (ref 0.0–5.0)
HCT: 39.3 % (ref 36.0–46.0)
Hemoglobin: 13 g/dL (ref 12.0–15.0)
Lymphocytes Relative: 22.1 % (ref 12.0–46.0)
Lymphs Abs: 1 10*3/uL (ref 0.7–4.0)
MCHC: 33 g/dL (ref 30.0–36.0)
MCV: 96.4 fL (ref 78.0–100.0)
Monocytes Absolute: 0.3 10*3/uL (ref 0.1–1.0)
Monocytes Relative: 7.2 % (ref 3.0–12.0)
Neutro Abs: 3.1 10*3/uL (ref 1.4–7.7)
Neutrophils Relative %: 69.1 % (ref 43.0–77.0)
Platelets: 240 10*3/uL (ref 150.0–400.0)
RBC: 4.08 Mil/uL (ref 3.87–5.11)
RDW: 13.7 % (ref 11.5–15.5)
WBC: 4.5 10*3/uL (ref 4.0–10.5)

## 2023-06-10 LAB — COMPREHENSIVE METABOLIC PANEL
ALT: 23 U/L (ref 0–35)
AST: 33 U/L (ref 0–37)
Albumin: 4.4 g/dL (ref 3.5–5.2)
Alkaline Phosphatase: 68 U/L (ref 39–117)
BUN: 15 mg/dL (ref 6–23)
CO2: 29 meq/L (ref 19–32)
Calcium: 9.3 mg/dL (ref 8.4–10.5)
Chloride: 100 meq/L (ref 96–112)
Creatinine, Ser: 0.63 mg/dL (ref 0.40–1.20)
GFR: 85.46 mL/min (ref >60.00)
Glucose, Bld: 180 mg/dL — ABNORMAL HIGH (ref 70–99)
Potassium: 4.1 meq/L (ref 3.5–5.1)
Sodium: 136 meq/L (ref 135–145)
Total Bilirubin: 0.5 mg/dL (ref 0.2–1.2)
Total Protein: 7.3 g/dL (ref 6.0–8.3)

## 2023-06-10 LAB — SEDIMENTATION RATE: Sed Rate: 17 mm/h (ref 0–30)

## 2023-06-10 MED ORDER — METRONIDAZOLE 500 MG PO TABS: 500.0000 mg | ORAL_TABLET | Freq: Two times a day (BID) | ORAL | 0 refills | Status: AC

## 2023-06-10 MED ORDER — DOXYCYCLINE HYCLATE 100 MG PO TABS: 100.0000 mg | ORAL_TABLET | Freq: Two times a day (BID) | ORAL | 0 refills | Status: AC

## 2023-06-10 NOTE — Telephone Encounter (Signed)
Why is she scheduled with Dr. Patsy Lager on 12/4 , when I have many openings that day?

## 2023-06-10 NOTE — Assessment & Plan Note (Signed)
KUB and labs ordered today.  Worsening pain to seek medical attention, will treat with Doxy and Flagyl secondary to patient having to leave the country in the near future.

## 2023-06-10 NOTE — Patient Instructions (Signed)
Good to see you  Xray and labs today  Doxy and flagyl 2 times a day for 7 days  Have a great trip and see you in march!

## 2023-06-10 NOTE — Assessment & Plan Note (Signed)
Significant sacroiliac dysfunction as well as lower back spinal stenosis.  Discussed which activities to do and which ones to avoid.  Increase activity slowly over the course of next several days.  Do believe the patient may have had a diverticulosis or diverticulitis flare and given antibiotics with patient is leaving the country for the next 3 months.  Patient knows worsening symptoms to seek medical attention otherwise.  Will get labs as well as a KUB to further evaluate.

## 2023-06-11 NOTE — Telephone Encounter (Signed)
Advised FO staff to move pt to PCP schedule

## 2023-06-12 ENCOUNTER — Ambulatory Visit: Payer: Medicare PPO | Admitting: Family Medicine

## 2023-06-12 ENCOUNTER — Encounter: Payer: Self-pay | Admitting: Gastroenterology

## 2023-06-12 ENCOUNTER — Ambulatory Visit (HOSPITAL_BASED_OUTPATIENT_CLINIC_OR_DEPARTMENT_OTHER)
Admission: RE | Admit: 2023-06-12 | Discharge: 2023-06-12 | Disposition: A | Discharge status: Home / Self Care | Payer: Medicare PPO | Source: Ambulatory Visit | Attending: Family Medicine | Admitting: Family Medicine

## 2023-06-12 ENCOUNTER — Encounter (HOSPITAL_BASED_OUTPATIENT_CLINIC_OR_DEPARTMENT_OTHER): Payer: Self-pay

## 2023-06-12 DIAGNOSIS — Z1231 Encounter for screening mammogram for malignant neoplasm of breast: Secondary | ICD-10-CM | POA: Insufficient documentation

## 2023-07-25 ENCOUNTER — Other Ambulatory Visit: Payer: Medicare PPO

## 2023-07-25 ENCOUNTER — Ambulatory Visit: Payer: Medicare PPO

## 2023-09-30 NOTE — Progress Notes (Unsigned)
 [name] [address] [place] [address] Phone: [phone] Subjective:   Kelsey [name], am serving as a scribe for Dr. Antoine Primas.  I'm seeing this patient by the request  of:  [name], [name], DO  CC: Back and neck pain follow-up  [id number]  Kelsey [name] is a 78 y.o. female coming in with complaint of back and neck pain. OMT 06/10/2023. Patient states that her back pain stated again after a long trip to [place]. Pain in both SI joints when coughing. Is sick from traveling.   Medications patient has been prescribed: None  Taking:         Reviewed prior external information including notes and imaging from previsou exam, outside providers and external EMR if available.   As well as notes that were available from care everywhere and other healthcare systems.  Past medical history, social, surgical and family history all reviewed in electronic medical record.  No pertanent information unless stated regarding to the chief complaint.   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   Bladder prolapse, female, acquired    Chicken pox as a child   De Quervain's tenosynovitis, left 12/11/2017   Dysuria 09/13/2011   Female bladder prolapse 09/13/2011   Fibrocystic breast 09/14/2011   GERD (gastroesophageal reflux disease)    H/O measles    H/O mumps    History of iron deficiency anemia 12/02/2013   Hyperlipidemia    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   Osteoarthrosis    right shoulder   Osteoporosis    reclast in Oct   Ovarian cyst 09/14/2011   Renal cyst left   3 cm   R[facility] spotted fever) 2017   Situational anxiety 03/19/2014   R/t grieving process & caregiver burden for husband who has dementia, alzheimer's.    Uterine fibroid 11/2012   See Dr. Estanislado Pandy note, surgery 11/2012; calcified- multiple    Vaginitis 09/13/2011    Allergies  Allergen Reactions   Penicillins Rash    Has patient had a PCN reaction causing immediate rash, facial/tongue/throat swelling, SOB or lightheadedness with hypotension: no Has patient had a PCN reaction causing severe rash involving mucus membranes or skin necrosis: yes Has patient had a PCN reaction that required hospitalization: no Has patient had a PCN reaction occurring within the last 10 years: no If all of the above answers are "NO", then may proceed with Cephalosporin use.    Sulfa Antibiotics Rash    Under arm and thighs     Review of Systems:  No headache, visual changes, nausea, vomiting, diarrhea, constipation, dizziness, abdominal pain, skin rash, fevers, chills, night sweats, weight loss, swollen lymph nodes, body aches, joint swelling, chest pain, shortness of breath, mood changes. POSITIVE muscle aches  Objective  Blood pressure 100/62, pulse 68, height 5\' 4"  (1.626 m), weight 106 lb (48.1 kg), SpO2 98%.   General: No apparent distress alert and oriented x3 mood and affect normal, dressed appropriately.  HEENT: Pupils equal, extraocular movements intact  Respiratory: Patient's speak in full sentences and does not appear short of breath  Cardiovascular: No lower extremity edema, non tender, no erythema  Gait MSK:  Back does have some loss lordosis of the lumbar spine and does have some degenerative scoliosis noted.  Patient does have some tightness especially with extension of the back but  patient does have great range of motion for patient's age.  Osteopathic findings  T5 extended rotated and side bent left L2 flexed rotated and side bent right L5 flexed rotated and side bent left Sacrum right on right       Assessment and Plan:  Stenosis of intervertebral foramina Will continue to monitor, spinal stenosis and facet arthropathy contributing.  Patient will be back now after traveling 3 months at [place]  Hopefully she can get  back into the routine and this will help some of the tightness.  Patient does have significant arthritic changes and may have some exacerbation from time to time but likely nothing severe enough to stop her from activity.    Nonallopathic problems  Decision today to treat with OMT was based on Physical Exam  After verbal consent patient was treated with HVLA, ME, FPR techniques in cervical, rib, thoracic, lumbar, and sacral  areas  Patient tolerated the procedure well with improvement in symptoms  Patient given exercises, stretches and lifestyle modifications  See medications in patient instructions if given  Patient will follow up in 4-8 weeks    The above documentation has been reviewed and is accurate and complete Judi Saa, DO          Note: This dictation was prepared with Dragon dictation along with smaller phrase technology. Any transcriptional errors that result from this process are unintentional.

## 2023-10-01 ENCOUNTER — Encounter: Payer: Self-pay | Admitting: Family Medicine

## 2023-10-01 ENCOUNTER — Ambulatory Visit (INDEPENDENT_AMBULATORY_CARE_PROVIDER_SITE_OTHER): Payer: Medicare PPO | Admitting: Family Medicine

## 2023-10-01 VITALS — BP 100/62 | HR 68 | Ht 64.0 in | Wt 106.0 lb

## 2023-10-01 DIAGNOSIS — M9902 Segmental and somatic dysfunction of thoracic region: Secondary | ICD-10-CM | POA: Diagnosis not present

## 2023-10-01 DIAGNOSIS — M4800 Spinal stenosis, site unspecified: Secondary | ICD-10-CM

## 2023-10-01 DIAGNOSIS — M9903 Segmental and somatic dysfunction of lumbar region: Secondary | ICD-10-CM

## 2023-10-01 DIAGNOSIS — M9904 Segmental and somatic dysfunction of sacral region: Secondary | ICD-10-CM

## 2023-10-01 NOTE — Assessment & Plan Note (Signed)
 Will continue to monitor, spinal stenosis and facet arthropathy contributing.  Patient will be back now after traveling 3 months at [place]  Hopefully she can get back into the routine and this will help some of the tightness.  Patient does have significant arthritic changes and may have some exacerbation from time to time but likely nothing severe enough to stop her from activity.

## 2023-10-01 NOTE — Patient Instructions (Signed)
 Good to see you Trip sounded amazing Get back into routine See me in 4-5 weeks

## 2023-10-16 ENCOUNTER — Ambulatory Visit (INDEPENDENT_AMBULATORY_CARE_PROVIDER_SITE_OTHER): Payer: Medicare PPO | Admitting: *Deleted

## 2023-10-16 DIAGNOSIS — Z0000 Encounter for general adult medical examination without abnormal findings: Secondary | ICD-10-CM

## 2023-10-16 NOTE — Patient Instructions (Signed)
 Kelsey [name] , Thank you for taking time to come for your Medicare Wellness Visit. I appreciate your ongoing commitment to your health goals. Please review the following plan we discussed and let me know if I can assist you in the future.   Screening recommendations/referrals: Colonoscopy: no longer required Mammogram: up to date Bone Density: up to date Recommended yearly ophthalmology/optometry visit for glaucoma screening and checkup Recommended yearly dental visit for hygiene and checkup  Vaccinations: Influenza vaccine: up to date Pneumococcal vaccine: up to date Tdap vaccine: up to date Shingles vaccine: up to date      Preventive Care 65 Years and Older, Female Preventive care refers to lifestyle choices and visits with your health care provider that can promote health and wellness. What does preventive care include? A yearly physical exam. This is also called an annual well check. Dental exams once or twice a year. Routine eye exams. Ask your health care provider how often you should have your eyes checked. Personal lifestyle choices, including: Daily care of your teeth and gums. Regular physical activity. Eating a healthy diet. Avoiding tobacco and drug use. Limiting alcohol use. Practicing safe sex. Taking low-dose aspirin every day. Taking vitamin and mineral supplements as recommended by your health care provider. What happens during an annual well check? The services and screenings done by your health care provider during your annual well check will depend on your age, overall health, lifestyle risk factors, and family history of disease. Counseling  Your health care provider may ask you questions about your: Alcohol use. Tobacco use. Drug use. Emotional well-being. Home and relationship well-being. Sexual activity. Eating habits. History of falls. Memory and ability to understand (cognition). Work and work Astronomer. Reproductive health. Screening   You may have the following tests or measurements: Height, weight, and BMI. Blood pressure. Lipid and cholesterol levels. These may be checked every 5 years, or more frequently if you are over 24 years old. Skin check. Lung cancer screening. You may have this screening every year starting at age 65 if you have a 30-pack-year history of smoking and currently smoke or have quit within the past 15 years. Fecal occult blood test (FOBT) of the stool. You may have this test every year starting at age 76. Flexible sigmoidoscopy or colonoscopy. You may have a sigmoidoscopy every 5 years or a colonoscopy every 10 years starting at age 46. Hepatitis C blood test. Hepatitis B blood test. Sexually transmitted disease (STD) testing. Diabetes screening. This is done by checking your blood sugar (glucose) after you have not eaten for a while (fasting). You may have this done every 1-3 years. Bone density scan. This is done to screen for osteoporosis. You may have this done starting at age 35. Mammogram. This may be done every 1-2 years. Talk to your health care provider about how often you should have regular mammograms. Talk with your health care provider about your test results, treatment options, and if necessary, the need for more tests. Vaccines  Your health care provider may recommend certain vaccines, such as: Influenza vaccine. This is recommended every year. Tetanus, diphtheria, and acellular pertussis (Tdap, Td) vaccine. You may need a Td booster every 10 years. Zoster vaccine. You may need this after age 44. Pneumococcal 13-valent conjugate (PCV13) vaccine. One dose is recommended after age 76. Pneumococcal polysaccharide (PPSV23) vaccine. One dose is recommended after age 43. Talk to your health care provider about which screenings and vaccines you need and how often you need them. This  information is not intended to replace advice given to you by your health care provider. Make sure you discuss  any questions you have with your health care provider. Document Released: 07/22/2015 Document Revised: 03/14/2016 Document Reviewed: 04/26/2015 Elsevier Interactive Patient Education  2017 [employer].  Fall Prevention in the Home Falls can cause injuries. They can happen to people of all ages. There are many things you can do to make your home safe and to help prevent falls. What can I do on the outside of my home? Regularly fix the edges of walkways and driveways and fix any cracks. Remove anything that might make you trip as you walk through a door, such as a raised step or threshold. Trim any bushes or trees on the path to your home. Use bright outdoor lighting. Clear any walking paths of anything that might make someone trip, such as rocks or tools. Regularly check to see if handrails are loose or broken. Make sure that both sides of any steps have handrails. Any raised decks and porches should have guardrails on the edges. Have any leaves, snow, or ice cleared regularly. Use sand or salt on walking paths during winter. Clean up any spills in your garage right away. This includes oil or grease spills. What can I do in the bathroom? Use night lights. Install grab bars by the toilet and in the tub and shower. Do not use towel bars as grab bars. Use non-skid mats or decals in the tub or shower. If you need to sit down in the shower, use a plastic, non-slip stool. Keep the floor dry. Clean up any water that spills on the floor as soon as it happens. Remove soap buildup in the tub or shower regularly. Attach bath mats securely with double-sided non-slip rug tape. Do not have throw rugs and other things on the floor that can make you trip. What can I do in the bedroom? Use night lights. Make sure that you have a light by your bed that is easy to reach. Do not use any sheets or blankets that are too big for your bed. They should not hang down onto the floor. Have a firm chair that has  side arms. You can use this for support while you get dressed. Do not have throw rugs and other things on the floor that can make you trip. What can I do in the kitchen? Clean up any spills right away. Avoid walking on wet floors. Keep items that you use a lot in easy-to-reach places. If you need to reach something above you, use a strong step stool that has a grab bar. Keep electrical cords out of the way. Do not use floor polish or wax that makes floors slippery. If you must use wax, use non-skid floor wax. Do not have throw rugs and other things on the floor that can make you trip. What can I do with my stairs? Do not leave any items on the stairs. Make sure that there are handrails on both sides of the stairs and use them. Fix handrails that are broken or loose. Make sure that handrails are as long as the stairways. Check any carpeting to make sure that it is firmly attached to the stairs. Fix any carpet that is loose or worn. Avoid having throw rugs at the top or bottom of the stairs. If you do have throw rugs, attach them to the floor with carpet tape. Make sure that you have a light switch at the top  of the stairs and the bottom of the stairs. If you do not have them, ask someone to add them for you. What else can I do to help prevent falls? Wear shoes that: Do not have high heels. Have rubber bottoms. Are comfortable and fit you well. Are closed at the toe. Do not wear sandals. If you use a stepladder: Make sure that it is fully opened. Do not climb a closed stepladder. Make sure that both sides of the stepladder are locked into place. Ask someone to hold it for you, if possible. Clearly mark and make sure that you can see: Any grab bars or handrails. First and last steps. Where the edge of each step is. Use tools that help you move around (mobility aids) if they are needed. These include: Canes. Walkers. Scooters. Crutches. Turn on the lights when you go into a dark area.  Replace any light bulbs as soon as they burn out. Set up your furniture so you have a clear path. Avoid moving your furniture around. If any of your floors are uneven, fix them. If there are any pets around you, be aware of where they are. Review your medicines with your doctor. Some medicines can make you feel dizzy. This can increase your chance of falling. Ask your doctor what other things that you can do to help prevent falls. This information is not intended to replace advice given to you by your health care provider. Make sure you discuss any questions you have with your health care provider. Document Released: 04/21/2009 Document Revised: 12/01/2015 Document Reviewed: 07/30/2014 Elsevier Interactive Patient Education  2017 [employer].

## 2023-10-16 NOTE — Progress Notes (Signed)
 Subjective:   Kelsey [name] is a 78 y.o. female who presents for Medicare Annual (Subsequent) preventive examination.  Visit Complete: Virtual I connected with  Kelsey [name] on 10/16/23 by a audio enabled telemedicine application and verified that I am speaking with the correct person using two identifiers.  Patient Location: Home  Provider Location: Home Office  I discussed the limitations of evaluation and management by telemedicine. The patient expressed understanding and agreed to proceed.  Vital Signs: Because this visit was a virtual/telehealth visit, some criteria may be missing or patient reported. Any vitals not documented were not able to be obtained and vitals that have been documented are patient reported.  Patient Medicare AWV questionnaire was completed by the patient on 10-15-2023; I have confirmed that all information answered by patient is correct and no changes since this date.  Cardiac Risk Factors include: advanced age (>57men, >27 women)     Objective:    There were no vitals filed for this visit. There is no height or weight on file to calculate BMI.     10/16/2023    8:53 AM 10/03/2022    9:06 AM 09/06/2021   12:00 PM 03/30/2021   11:23 PM 01/23/2021   10:41 AM 08/17/2020    9:49 AM 06/07/2020   10:38 AM  Advanced Directives  Does Patient Have a Medical Advance Directive? No Yes Yes No Yes Yes Yes  Type of Furniture conservator/restorer;Living will Healthcare Power of [employer] of [place];Living will;Out of facility DNR (pink MOST or yellow form) Healthcare Power of [place];Living will Living will  Does patient want to make changes to medical advance directive?  No - Patient declined       Copy of Healthcare Power of Attorney in Chart?  Yes - validated most recent copy scanned in chart (See row information) Yes - validated most recent copy scanned in chart (See row information)   Yes - validated most recent copy  scanned in chart (See row information)   Would patient like information on creating a medical advance directive? No - Patient declined   No - Patient declined       Current Medications (verified) Outpatient Encounter Medications as of 10/16/2023  Medication Sig   atorvastatin (LIPITOR) 10 MG tablet Take 1 tablet (10 mg total) by mouth every evening.   Calcium Carbonate-Vit D-Min (CALCIUM 1200 PO) Take 1,200 mg by mouth daily.   omeprazole (PRILOSEC) 40 MG capsule TAKE 1 CAPSULE DAILY.   Vitamin D, Ergocalciferol, (DRISDOL) 1.25 MG (50000 UNIT) CAPS capsule Take 1 capsule (50,000 Units total) by mouth every 7 (seven) days.   dexamethasone (DECADRON) 4 MG tablet 1 tab PO every 12 hours with food started night prior to ascent  for doses (Patient not taking: Reported on 05/10/2023)   famotidine (PEPCID) 20 MG tablet Take 1 tablet (20 mg total) by mouth 2 (two) times daily.   No facility-administered encounter medications on file as of 10/16/2023.    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    Uterine fibroid 11/2012   See Dr. Estanislado Pandy note, surgery 11/2012;  calcified- multiple   Vaginitis 09/13/2011   Past Surgical History:  Procedure Laterality Date   DILATATION & CURRETTAGE/HYSTEROSCOPY WITH RESECTOCOPE N/A 11/06/2012   Procedure: DILATATION & CURETTAGE/HYSTEROSCOPY WITH RESECTOCOPE;  Surgeon: Esmeralda Arthur, MD;  Location: [facility];  Service: Gynecology;  Laterality: N/A;   HERNIA REPAIR  1992   I & D EXTREMITY Left 03/08/2021   Procedure: IRRIGATION AND DEBRIDEMENT OF THUMB AND ARM;  Surgeon: Betha Loa, MD;  Location: [facility] OR;  Service: Orthopedics;  Laterality: Left;   small intestine hernia  1994   WISDOM TOOTH EXTRACTION     Family History  Problem Relation Age of Onset   Heart disease Mother    Osteoporosis Mother    Cancer Father        prostate   Osteoporosis Sister    Osteoporosis Sister    Gout Brother    Gout Maternal Grandmother    Heart disease Maternal Grandmother    Alzheimer's disease Maternal Grandfather    Colon cancer Neg Hx    Colon polyps Neg Hx    Esophageal cancer Neg Hx    Rectal cancer Neg Hx    Stomach cancer Neg Hx    Social History   Socioeconomic History   Marital status: Widowed    Spouse name: Not on file   Number of children: 1   Years of education: Not on file   Highest education level: Bachelor's degree (e.g., BA, AB, BS)  Occupational History   Occupation: retired  Tobacco Use   Smoking status: Former    Current packs/day: 0.00    Average packs/day: 0.3 packs/day for 10.0 years (3.0 ttl pk-yrs)    Types: Cigarettes    Start date: 07/09/1968    Quit date: 07/09/1978    Years since quitting: 45.3   Smokeless tobacco: Never  Vaping Use   Vaping status: Never Used  Substance and Sexual Activity   Alcohol use: Yes    Comment: 1-2 glasses of wine daily and a 12 oz beer daily   Drug use: No   Sexual activity: Never  Other Topics Concern   Not on file  Social History Narrative   Right handed    Lives with husband    Social Drivers of Health   Financial Resource Strain: Low Risk   (10/16/2023)   Overall Financial Resource Strain (CARDIA)    Difficulty of Paying Living Expenses: Not hard at all  Food Insecurity: No Food Insecurity (10/16/2023)   Hunger Vital Sign    Worried About Running Out of Food in the Last Year: Never true    Ran Out of Food in the Last Year: Never true  Transportation Needs: No Transportation Needs (10/16/2023)   PRAPARE - Administrator, Civil Service (Medical): No    Lack of Transportation (Non-Medical): No  Physical Activity: Sufficiently Active (10/16/2023)   Exercise Vital Sign    Days of Exercise per  Week: 6 days    Minutes of Exercise per Session: 40 min  Stress: No Stress Concern Present (10/16/2023)   [employer] of Occupational Health - Occupational Stress Questionnaire    Feeling of Stress : Not at all  Social Connections: Moderately Isolated (10/16/2023)   Social Connection and Isolation Panel [NHANES]    Frequency of Communication with Friends and Family: More than three times a week    Frequency of Social Gatherings with Friends and Family: More than three times a week    Attends Religious Services: Never    Database administrator or Organizations: Yes    Attends Engineer, structural: More than 4 times per year    Marital Status: Widowed    Tobacco Counseling Counseling given: Not Answered   Clinical Intake:  Pre-visit preparation completed: Yes  Pain : No/denies pain     Diabetes: No  How often do you need to have someone help you when you read instructions, pamphlets, or other written materials from your doctor or pharmacy?: 1 - Never  Interpreter Needed?: No  Information entered by :: Remi Haggard LPN   Activities of Daily Living    10/16/2023    8:56 AM 10/15/2023    8:19 PM  In your present state of health, do you have any difficulty performing the following activities:  Hearing? 0 0  Vision? 0 0  Difficulty concentrating or making decisions? 1 1  Walking or climbing stairs? 0 0   Dressing or bathing? 0 0  Doing errands, shopping? 0 0  Preparing Food and eating ? N N  Using the Toilet? N N  In the past six months, have you accidently leaked urine? N N  Do you have problems with loss of bowel control? N N  Managing your Medications? N N  Managing your Finances? N N  Housekeeping or managing your Housekeeping? Malvin Johns    Patient Care Team: Natalia Leatherwood, DO as PCP - General (Family Medicine) Charna Elizabeth, MD as Consulting Physician (Gastroenterology) Nelson Chimes, MD as Consulting Physician (Ophthalmology) Judi Saa, DO as Consulting Physician (Family Medicine) Van Clines, MD as Consulting Physician (Neurology)  Indicate any recent Medical Services you may have received from other than [facility] providers in the past year (date may be approximate).     Assessment:   This is a routine wellness examination for Nahla.  Hearing/Vision screen Hearing Screening - Comments:: No trouble hearing Vision Screening - Comments:: [facility]  Up to date   Goals Addressed             This Visit's Progress    Patient Stated   On track    Continue doing Tai Chi     Patient Stated       travel       Depression Screen    10/16/2023    8:56 AM 10/03/2022    9:05 AM 06/13/2022   10:58 AM 10/17/2021    8:46 AM 09/06/2021   11:59 AM 05/08/2021    9:36 AM 08/19/2020   10:28 AM  PHQ 2/9 Scores  PHQ - 2 Score 0 0 0 0 0 0 1  PHQ- 9 Score 0      7    Fall Risk    10/16/2023    8:52 AM 10/15/2023    8:19 PM 11/03/2022    4:11 PM 10/03/2022    9:07 AM 06/13/2022   10:58 AM  Fall Risk   Falls in the  past year? 0 0 1 1 1   Number falls in past yr: 0 0 0 1 0  Injury with Fall? 0 0 1 1 0  Comment    head   Risk for fall due to :    Impaired vision History of fall(s)  Follow up Falls evaluation completed;Education provided;Falls prevention discussed   Falls prevention discussed Falls evaluation completed    MEDICARE RISK AT HOME: Medicare Risk at Home Any stairs  in or around the home?: Yes If so, are there any without handrails?: No Home free of loose throw rugs in walkways, pet beds, electrical cords, etc?: Yes Adequate lighting in your home to reduce risk of falls?: Yes Life alert?: No Use of a cane, walker or w/c?: No Grab bars in the bathroom?: Yes Shower chair or bench in shower?: No Elevated toilet seat or a handicapped toilet?: No  TIMED UP AND GO:  Was the test performed?  No    Cognitive Function:    04/07/2018    3:17 PM  MMSE - Mini Mental State Exam  Orientation to time 5  Orientation to Place 5  Registration 3  Attention/ Calculation 5  Recall 3  Language- name 2 objects 2  Language- repeat 1  Language- follow 3 step command 3  Language- read & follow direction 1  Write a sentence 1  Copy design 1  Total score 30      01/23/2021   10:43 AM  Montreal Cognitive Assessment   Visuospatial/ Executive (0/5) 5  Naming (0/3) 3  Attention: Read list of digits (0/2) 2  Attention: Read list of letters (0/1) 1  Attention: Serial 7 subtraction starting at 100 (0/3) 3  Language: Repeat phrase (0/2) 0  Language : Fluency (0/1) 1  Abstraction (0/2) 2  Delayed Recall (0/5) 5  Orientation (0/6) 6  Total 28  Adjusted Score (based on education) 28      10/16/2023    8:54 AM 10/03/2022    9:08 AM 09/06/2021   12:03 PM 08/17/2020    9:59 AM  6CIT Screen  What Year? 0 points 0 points 0 points 0 points  What month? 0 points 0 points 0 points 0 points  What time? 0 points 0 points 0 points 0 points  Count back from 20 2 points 0 points 0 points 0 points  Months in reverse 0 points 0 points 0 points 0 points  Repeat phrase 0 points 0 points 2 points 0 points  Total Score 2 points 0 points 2 points 0 points    Immunizations Immunization History  Administered Date(s) Administered   Fluad Quad(high Dose 65+) 04/14/2019, 08/19/2020, 05/08/2021, 03/21/2022   Fluad Trivalent(High Dose 65+) 04/04/2023   Hepatitis A, Adult 03/23/2014    Influenza Whole 04/09/2011   Influenza, High Dose Seasonal PF 05/27/2015, 04/12/2016, 04/02/2017, 04/07/2018   Influenza,inj,Quad PF,6+ Mos 03/19/2014   Moderna Covid-19 Vaccine Bivalent Booster 51yrs & up 05/25/2021   PFIZER(Purple Top)SARS-COV-2 Vaccination 07/20/2019, 08/19/2019   Pneumococcal Conjugate-13 12/02/2013, 04/02/2014   Pneumococcal Polysaccharide-23 12/08/2015   Tdap 12/02/2013, 03/08/2021   Zoster Recombinant(Shingrix) 06/13/2020, 08/19/2020    TDAP status: Up to date  Flu Vaccine status: Up to date  Pneumococcal vaccine status: Up to date  Covid-19 vaccine status: Information provided on how to obtain vaccines.   Qualifies for Shingles Vaccine? No   Zostavax completed Yes   Shingrix Completed?: Yes  Screening Tests Health Maintenance  Topic Date Due   INFLUENZA VACCINE  02/07/2024  MAMMOGRAM  06/11/2024   Medicare Annual Wellness (AWV)  10/15/2024   DEXA SCAN  02/17/2025   DTaP/Tdap/Td (3 - Td or Tdap) 03/09/2031   Pneumonia Vaccine 73+ Years old  Completed   Hepatitis C Screening  Completed   Zoster Vaccines- Shingrix  Completed   HPV VACCINES  Aged Out   Colonoscopy  Discontinued   COVID-19 Vaccine  Discontinued    Health Maintenance  There are no preventive care reminders to display for this patient.   Colorectal cancer screening: No longer required.   Mammogram status: Completed  . Repeat every year  Bone Density status: Completed 2024. Results reflect: Bone density results: OSTEOPOROSIS. Repeat every 2 years.  Lung Cancer Screening: (Low Dose CT Chest recommended if Age 45-80 years, 20 pack-year currently smoking OR have quit w/in 15years.) does not qualify.   Lung Cancer Screening Referral:   Additional Screening:  Hepatitis C Screening: does not qualify; Completed 2015  Vision Screening: Recommended annual ophthalmology exams for early detection of glaucoma and other disorders of the eye. Is the patient up to date with their  annual eye exam?  Yes  Who is the provider or what is the name of the office in which the patient attends annual eye exams? [facility] If pt is not established with a provider, would they like to be referred to a provider to establish care? No .   Dental Screening: Recommended annual dental exams for proper oral hygiene    Community Resource Referral / Chronic Care Management: CRR required this visit?  No   CCM required this visit?  No     Plan:     I have personally reviewed and noted the following in the patient's chart:   Medical and social history Use of alcohol, tobacco or illicit drugs  Current medications and supplements including opioid prescriptions. Patient is not currently taking opioid prescriptions. Functional ability and status Nutritional status Physical activity Advanced directives List of other physicians Hospitalizations, surgeries, and ER visits in previous 12 months Vitals Screenings to include cognitive, depression, and falls Referrals and appointments  In addition, I have reviewed and discussed with patient certain preventive protocols, quality metrics, and best practice recommendations. A written personalized care plan for preventive services as well as general preventive health recommendations were provided to patient.     Remi Haggard, LPN   07/14/1094   After Visit Summary: (MyChart) Due to this being a telephonic visit, the after visit summary with patients personalized plan was offered to patient via MyChart   Nurse Notes:

## 2023-11-01 NOTE — Progress Notes (Signed)
 [facility] [address] [place] [address] Phone: [phone] Subjective:   Kelsey [name], am serving as a scribe for Dr. Ronnell Coins.  I'Kelsey seeing this patient by the request  of:  [name], [name], DO  CC: Back and neck pain follow-up  BJY:[id number]  Kelsey [name] is a 78 y.o. female coming in with complaint of back and neck pain. OMT on 09/11/2023. Patient states that she continues to have R glute and SI joint pain.   Medications patient has been prescribed:   Taking:         Reviewed prior external information including notes and imaging from previsou exam, outside providers and external EMR if available.   As well as notes that were available from care everywhere and other healthcare systems.  Past medical history, social, surgical and family history all reviewed in electronic medical record.  No pertanent information unless stated regarding to the chief complaint.   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    Uterine fibroid 11/2012   See Dr. Duke Gibbons note, surgery 11/2012; calcified- multiple   Vaginitis 09/13/2011    Allergies  Allergen Reactions    Penicillins Rash    Has patient had a PCN reaction causing immediate rash, facial/tongue/throat swelling, SOB or lightheadedness with hypotension: no Has patient had a PCN reaction causing severe rash involving mucus membranes or skin necrosis: yes Has patient had a PCN reaction that required hospitalization: no Has patient had a PCN reaction occurring within the last 10 years: no If all of the above answers are "NO", then may proceed with Cephalosporin use.    Sulfa Antibiotics Rash    Under arm and thighs     Review of Systems:  No headache, visual changes, nausea, vomiting, diarrhea, constipation, dizziness, abdominal pain, skin rash, fevers, chills, night sweats, weight loss, swollen lymph nodes, body aches, joint swelling, chest pain, shortness of breath, mood changes. POSITIVE muscle aches  Objective  Blood pressure 102/72, height 5\' 4"  (1.626 Kelsey), weight 105 lb (47.6 kg).   General: No apparent distress alert and oriented x3 mood and affect normal, dressed appropriately.  HEENT: Pupils equal, extraocular movements intact  Respiratory: Patient's speak in full sentences and does not appear short of breath  Cardiovascular: No lower extremity edema, non tender, no erythema  Gait MSK:  Back does have some loss of lordosis noted.  Some tenderness to palpation of the paraspinal musculature.  Tightness with certain range of motion.  Seems to have less extension of the back noted.  Osteopathic findings  C2 flexed rotated and side bent right  C6 flexed rotated and side bent left T3 extended rotated and side bent right inhaled rib T9 extended rotated and side bent left L2 flexed rotated and side bent right Sacrum right on right       Assessment and Plan:  No problem-specific Assessment & Plan notes found for this encounter.    Nonallopathic problems  Decision today to treat with OMT was based on Physical Exam  After verbal consent patient was treated with HVLA, ME, FPR  techniques in cervical, rib, thoracic, lumbar, and sacral  areas  Patient tolerated the procedure well with improvement in symptoms  Patient given exercises, stretches and lifestyle modifications  See medications in patient instructions if given  Patient will follow up in 4-8 weeks      The above documentation has been reviewed and is accurate and complete Kelsey [name] Kelsey [name], DO        Note: This dictation was prepared with Dragon dictation along with smaller phrase technology. Any transcriptional errors that result from this process are unintentional.

## 2023-11-05 ENCOUNTER — Ambulatory Visit (INDEPENDENT_AMBULATORY_CARE_PROVIDER_SITE_OTHER): Admitting: Family Medicine

## 2023-11-05 ENCOUNTER — Encounter: Payer: Self-pay | Admitting: Family Medicine

## 2023-11-05 VITALS — BP 102/72 | Ht 64.0 in | Wt 105.0 lb

## 2023-11-05 DIAGNOSIS — M9901 Segmental and somatic dysfunction of cervical region: Secondary | ICD-10-CM | POA: Diagnosis not present

## 2023-11-05 DIAGNOSIS — M4800 Spinal stenosis, site unspecified: Secondary | ICD-10-CM

## 2023-11-05 DIAGNOSIS — M9903 Segmental and somatic dysfunction of lumbar region: Secondary | ICD-10-CM

## 2023-11-05 DIAGNOSIS — M9902 Segmental and somatic dysfunction of thoracic region: Secondary | ICD-10-CM

## 2023-11-05 DIAGNOSIS — M9908 Segmental and somatic dysfunction of rib cage: Secondary | ICD-10-CM | POA: Diagnosis not present

## 2023-11-05 DIAGNOSIS — M9904 Segmental and somatic dysfunction of sacral region: Secondary | ICD-10-CM

## 2023-11-05 NOTE — Assessment & Plan Note (Signed)
 Continue to monitor closely.  Discussed icing regimen and home exercises, discussed which activities to do and which ones to avoid.  Increase activity slowly.  Discussed icing regimen of home exercises.  Still does not want to take any other significant medications.  Recently did return 78 years old. Continue to be active.  Follow-up again in 6 to 8 weeks

## 2023-11-05 NOTE — Patient Instructions (Signed)
 Try stretching after gardening See me in 4-5 weeks

## 2023-12-04 NOTE — Progress Notes (Signed)
 [facility] [address] [place] [address] Phone: [phone] Subjective:   IBryan Caprio, am serving as a scribe for Dr. Ronnell Coins.  I'm seeing this patient by the request  of:  [name], [name], DO  CC: Back and neck pain follow-up  BJY:[id number]  Kelsey [name] is a 78 y.o. female coming in with complaint of back and neck pain. OMT 11/05/2023. Patient states doing well. No new symptoms.  Patient has been doing a lot of yard work recently.  Knows that that does cause some discomfort and pain but unlikely as long as she does the stretching and icing seems to brings it to a very minimal amount of discomfort.  Medications patient has been prescribed: None  Taking:         Reviewed prior external information including notes and imaging from previsou exam, outside providers and external EMR if available.   As well as notes that were available from care everywhere and other healthcare systems.  Past medical history, social, surgical and family history all reviewed in electronic medical record.  No pertanent information unless stated regarding to the chief complaint.   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    Uterine fibroid 11/2012   See Dr. Duke Gibbons note, surgery 11/2012; calcified- multiple   Vaginitis 09/13/2011    Allergies  Allergen Reactions   Penicillins Rash    Has patient had a PCN reaction causing immediate rash, facial/tongue/throat swelling, SOB or lightheadedness with hypotension: no Has patient had a PCN reaction causing severe rash involving mucus membranes or skin necrosis: yes Has patient had a PCN reaction that required hospitalization: no Has patient had a PCN reaction occurring within the last 10 years: no If all of the above answers are "NO", then may proceed with Cephalosporin use.    Sulfa Antibiotics Rash    Under arm and thighs     Review of Systems:  No headache, visual changes, nausea, vomiting, diarrhea, constipation, dizziness, abdominal pain, skin rash, fevers, chills, night sweats, weight loss, swollen lymph nodes, body aches, joint swelling, chest pain, shortness of breath, mood changes. POSITIVE muscle aches  Objective  Blood pressure 102/60, pulse 87, height 5\' 4"  (1.626 m), weight 108 lb (49 kg), SpO2 97%.   General: No apparent distress alert and oriented x3 mood and affect normal, dressed appropriately.  HEENT: Pupils equal, extraocular movements intact  Respiratory: Patient's speak in full sentences and does not appear short of breath  Cardiovascular: No lower extremity edema, non tender, no erythema  Gait MSK:  Back significant tightness of the lumbar  spine noted.  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  Only 5 degrees of extension noted.  Osteopathic findings  T3 extended rotated and side bent right inhaled rib T9 extended rotated and side bent left L2 flexed rotated and side bent right L4 flexed rotated and side bent right Sacrum right on right       Assessment and Plan:  Stenosis of intervertebral foramina Arthritic changes noted.  Discussed icing regimen and home exercises,  discussed which activities to do and which ones to avoid.  Increase activity slowly.  Patient has done fairly well with the severe spinal stenosis noted.  Patient still wants to hold on any other type of treatment options such as injections at this time.  Patient is still very active even doing a significant amount of yard work recently removing bamboo and still doing well.  Patient knows that we can do injections if needed.    Nonallopathic problems  Decision today to treat with OMT was based on Physical Exam  After verbal consent patient was treated with  ME, FPR techniques in  rib, thoracic, lumbar, and sacral  areas avoided any HVLA on the cervical region secondary to patient's history  Patient tolerated the procedure well with improvement in symptoms  Patient given exercises, stretches and lifestyle modifications  See medications in patient instructions if given  Patient will follow up in 4-8 weeks    The above documentation has been reviewed and is accurate and complete Kelsey Salim M Ayva Veilleux, DO          Note: This dictation was prepared with Dragon dictation along with smaller phrase technology. Any transcriptional errors that result from this process are unintentional.

## 2023-12-12 ENCOUNTER — Encounter: Payer: Self-pay | Admitting: Family Medicine

## 2023-12-12 ENCOUNTER — Ambulatory Visit (INDEPENDENT_AMBULATORY_CARE_PROVIDER_SITE_OTHER): Admitting: Family Medicine

## 2023-12-12 VITALS — BP 102/60 | HR 87 | Ht 64.0 in | Wt 108.0 lb

## 2023-12-12 DIAGNOSIS — M4800 Spinal stenosis, site unspecified: Secondary | ICD-10-CM | POA: Diagnosis not present

## 2023-12-12 DIAGNOSIS — M9904 Segmental and somatic dysfunction of sacral region: Secondary | ICD-10-CM | POA: Diagnosis not present

## 2023-12-12 DIAGNOSIS — M9903 Segmental and somatic dysfunction of lumbar region: Secondary | ICD-10-CM

## 2023-12-12 DIAGNOSIS — M9902 Segmental and somatic dysfunction of thoracic region: Secondary | ICD-10-CM

## 2023-12-12 NOTE — Assessment & Plan Note (Signed)
 Arthritic changes noted.  Discussed icing regimen and home exercises, discussed which activities to do and which ones to avoid.  Increase activity slowly.  Patient has done fairly well with the severe spinal stenosis noted.  Patient still wants to hold on any other type of treatment options such as injections at this time.  Patient is still very active even doing a significant amount of yard work recently removing bamboo and still doing well.  Patient knows that we can do injections if needed.

## 2023-12-23 ENCOUNTER — Telehealth: Payer: Self-pay

## 2023-12-23 NOTE — Telephone Encounter (Signed)
 Copied from CRM [id number]. Topic: Clinical - Medication Question >> Dec 23, 2023  3:38 PM Clyde Darling P wrote: Reason for CRM: Pt would like to know if she can start back on Prolia - would like to be contacted [phone]

## 2023-12-25 ENCOUNTER — Telehealth: Payer: Self-pay

## 2023-12-25 NOTE — Telephone Encounter (Signed)
 Noted

## 2023-12-25 NOTE — Telephone Encounter (Signed)
 Prolia VOB initiated via [email or web address]  Next Prolia inj DUE: NOW

## 2023-12-25 NOTE — Addendum Note (Signed)
 Addended by: Napolean Backbone A on: 12/25/2023 12:06 PM   Modules accepted: Orders

## 2023-12-25 NOTE — Telephone Encounter (Signed)
 Prolia  benefit verification started.  Please follow CAD Authorization workflow and place a CAM order to initiate Prolia  Benefits investigation. Standard turnaround time is 2 weeks.

## 2023-12-25 NOTE — Telephone Encounter (Signed)
 Call pt concerning prolia .  I do not recall prolia  being stopped for any reason. Please clarify with patient. Maybe needs new approval- I see last approval was through 06/2023.?

## 2023-12-26 ENCOUNTER — Other Ambulatory Visit (HOSPITAL_COMMUNITY): Payer: Self-pay

## 2023-12-26 NOTE — Telephone Encounter (Signed)
 Authorization Number: [id number] 12/26/23-12/25/24

## 2023-12-30 ENCOUNTER — Other Ambulatory Visit (HOSPITAL_COMMUNITY): Payer: Self-pay

## 2023-12-30 NOTE — Telephone Encounter (Signed)
 Kelsey [name]

## 2023-12-30 NOTE — Telephone Encounter (Signed)
 Pt ready for scheduling for PROLIA  on or after : 12/30/23  Option# 1: Buy/Bill (Office supplied medication)  Out-of-pocket cost due at time of clinic visit: $0  Number of injection/visits approved: 2  Primary: UHC-MEDICARE Prolia  co-insurance: 0% Admin fee co-insurance: 0%  Secondary: [employer]-MEDSUP Prolia  co-insurance:  Admin fee co-insurance:   Medical Benefit Details: Date Benefits were checked: 12/27/23 Deductible: NO/ Coinsurance: 0%/ Admin Fee: 0%  Prior Auth: APPROVED PA# [id number]  Expiration Date: 12/26/23-12/25/24   # of doses approved: 2 ----------------------------------------------------------------------- Option# 2- Med Obtained from pharmacy:  Pharmacy benefit: Copay $--- (Paid to pharmacy) Admin Fee: --- (Pay at clinic)  Prior Auth: --- PA# Expiration Date:   # of doses approved:   If patient wants fill through the pharmacy benefit please send prescription to: ---, and include estimated need by date in rx notes. Pharmacy will ship medication directly to the office.  Patient NOT eligible for Prolia  Copay Card. Copay Card can make patient's cost as little as $25. Link to apply: [email or web address]://[email or web address]  ** This summary of benefits is an estimation of the patient's out-of-pocket cost. Exact cost may very based on individual plan coverage.

## 2024-01-03 ENCOUNTER — Telehealth: Payer: Self-pay

## 2024-01-03 NOTE — Telephone Encounter (Signed)
 Does not need to have a calcium  level checked before starting Prolia .

## 2024-01-03 NOTE — Telephone Encounter (Signed)
 Communication  Reason for CRM: Patient is calling to verify with provider if she needs her calcium  levels checked before she receives her Prolia  injection on 7/1, states she wants to make sure since she hasn't had a blood test in over 6 months.        Minna [phone]   Please advise. Pt is currently due for [facility].

## 2024-01-03 NOTE — Telephone Encounter (Signed)
 Pt made aware

## 2024-01-06 ENCOUNTER — Other Ambulatory Visit: Payer: Self-pay | Admitting: Family Medicine

## 2024-01-06 ENCOUNTER — Telehealth: Payer: Self-pay

## 2024-01-06 NOTE — Telephone Encounter (Signed)
 Communication  Reason for CRM: Patient would like her prescription of atorvastatin  (LIPITOR) 10 MG tablet to be for 90 days instead of 30 days because [facility] told her it would be the same price   LVM.

## 2024-01-06 NOTE — Telephone Encounter (Signed)
 Communication  Reason for CRM: Pt states that this medication was only sent for 30 days and the 90-day refill is the same price so she would like the 90-day refill please.        atorvastatin  (LIPITOR) 10 MG tablet        [facility] - [place], [place] - [address]    [address] [place] [address]    Phone: [phone] Fax: [phone]    Hours: Not open 24 hours   LVM.

## 2024-01-07 ENCOUNTER — Ambulatory Visit

## 2024-01-07 DIAGNOSIS — M810 Age-related osteoporosis without current pathological fracture: Secondary | ICD-10-CM | POA: Diagnosis not present

## 2024-01-07 MED ORDER — DENOSUMAB 60 MG/ML ~~LOC~~ SOSY
60.0000 mg | PREFILLED_SYRINGE | SUBCUTANEOUS | Status: AC
  Administered 2024-01-07 – 2024-07-15 (×2): 60 mg via SUBCUTANEOUS

## 2024-01-07 NOTE — Progress Notes (Signed)
 Pt presented for Prolia  injection.  Last Prolia  11/14/22  Next injection due 07/09/24

## 2024-01-09 NOTE — Progress Notes (Signed)
 [name] [address] [place] [address] Phone: [phone] Subjective:   Kelsey [name], am serving as a scribe for Dr. Arthea Claudene.  I'm seeing this patient by the request  of:  [name], [name], DO  CC: Back pain follow-up  YEP:Dlagzrupcz  Kelsey [name] is a 78 y.o. female coming in with complaint of back and neck pain. OMT on 12/12/2023. Patient states that she is using a back stretching apparatus. Wants to know if this is safe to use. Did not have back ache when she was on cruise and wonders about bed. Wore sandals that she is wearing today on cruise as well as wonders if this was helpful.   Medications patient has been prescribed:   Taking:         Reviewed prior external information including notes and imaging from previsou exam, outside providers and external EMR if available.   As well as notes that were available from care everywhere and other healthcare systems.  Past medical history, social, surgical and family history all reviewed in electronic medical record.  No pertanent information unless stated regarding to the chief complaint.   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    Uterine fibroid 11/2012   See Dr. Darcel note, surgery 11/2012; calcified- multiple   Vaginitis 09/13/2011    Allergies  Allergen Reactions   Penicillins Rash    Has patient had a PCN reaction causing immediate rash, facial/tongue/throat swelling, SOB or lightheadedness with hypotension: no Has patient had a PCN reaction causing severe rash involving mucus membranes or skin necrosis: yes Has patient had a PCN reaction that required hospitalization: no Has patient had a PCN reaction occurring within the last 10 years: no If all of the above answers are NO, then may proceed with Cephalosporin use.    Sulfa Antibiotics Rash    Under arm and thighs     Review of Systems:  No headache, visual changes, nausea, vomiting, diarrhea, constipation, dizziness, abdominal pain, skin rash, fevers, chills, night sweats, weight loss, swollen lymph nodes, body aches, joint swelling, chest pain, shortness of breath, mood changes. POSITIVE muscle aches  Objective  Blood pressure 90/62, pulse (!) 52, height 5' 4 (1.626 m), SpO2 96%.   General: No apparent distress alert and oriented x3 mood and affect normal, dressed appropriately.  HEENT: Pupils equal, extraocular movements intact  Respiratory: Patient's speak in full sentences and does not appear short of breath  Cardiovascular: No lower extremity edema, non tender, no erythema  Gait relatively normal MSK:  Back low back does have some loss of  lordosis.  Patient has had some improvement in core strengthening at the moment.  Osteopathic findings   T3 extended rotated and side bent right inhaled rib T7 extended rotated and side bent left L2 flexed rotated and side bent right Sacrum right on right       Assessment and Plan:  Degenerative lumbar spinal stenosis Known spinal stenosis.  Discussed icing regimen and home exercises, which activities to do in which ones to avoid.  Increase activity slowly.  Discussed  icing regimen.  Follow-up with me again in 6 to 8 weeks otherwise.    Nonallopathic problems  Decision today to treat with OMT was based on Physical Exam  After verbal consent patient was treated with , ME, FPR techniques in thoracic, lumbar, and sacral  areas avoided HVLA as well as treatment of the neck  Patient tolerated the procedure well with improvement in symptoms  Patient given exercises, stretches and lifestyle modifications  See medications in patient instructions if given  Patient will follow up in 4-8 weeks    The above documentation has been reviewed and is accurate and complete Tajuana Kniskern M Kyden Potash, DO          Note: This dictation was prepared with Dragon dictation along with smaller phrase technology. Any transcriptional errors that result from this process are unintentional.

## 2024-01-14 ENCOUNTER — Ambulatory Visit: Admitting: Family Medicine

## 2024-01-15 ENCOUNTER — Encounter: Payer: Self-pay | Admitting: Family Medicine

## 2024-01-15 ENCOUNTER — Ambulatory Visit: Admitting: Family Medicine

## 2024-01-15 VITALS — BP 90/62 | HR 52 | Ht 64.0 in

## 2024-01-15 DIAGNOSIS — M9903 Segmental and somatic dysfunction of lumbar region: Secondary | ICD-10-CM

## 2024-01-15 DIAGNOSIS — M9902 Segmental and somatic dysfunction of thoracic region: Secondary | ICD-10-CM | POA: Diagnosis not present

## 2024-01-15 DIAGNOSIS — M48061 Spinal stenosis, lumbar region without neurogenic claudication: Secondary | ICD-10-CM

## 2024-01-15 DIAGNOSIS — M9904 Segmental and somatic dysfunction of sacral region: Secondary | ICD-10-CM | POA: Diagnosis not present

## 2024-01-15 NOTE — Patient Instructions (Signed)
 Early Aug, Late Aug, and late Sept appointments. Okay to double Good to see you!

## 2024-01-15 NOTE — Assessment & Plan Note (Signed)
 Known spinal stenosis.  Discussed icing regimen and home exercises, which activities to do in which ones to avoid.  Increase activity slowly.  Discussed icing regimen.  Follow-up with me again in 6 to 8 weeks otherwise.

## 2024-01-29 ENCOUNTER — Other Ambulatory Visit: Payer: Self-pay | Admitting: Family Medicine

## 2024-02-04 NOTE — Progress Notes (Unsigned)
 [facility] [address] [place] [address] Phone: [phone] Subjective:   Kelsey [name], am serving as a scribe for Dr. Arthea Claudene.  I'm seeing this patient by the request  of:  [name], [name], DO  CC: back and neck pain follow up   YEP:Dlagzrupcz  Kelsey [name] is a 78 y.o. female coming in with complaint of back and neck pain. OMT on 01/15/2024. Patient states that her L arm is weak and heavy and wonders if this is from her cervical spine fx one year ago. Patient has increase in symptoms when lying in her bed on her phone.   Medications patient has been prescribed:   Taking:         Reviewed prior external information including notes and imaging from previsou exam, outside providers and external EMR if available.   As well as notes that were available from care everywhere and other healthcare systems.  Past medical history, social, surgical and family history all reviewed in electronic medical record.  No pertanent information unless stated regarding to the chief complaint.   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    Uterine fibroid 11/2012   See Dr. Darcel  note, surgery 11/2012; calcified- multiple   Vaginitis 09/13/2011    Allergies  Allergen Reactions   Penicillins Rash    Has patient had a PCN reaction causing immediate rash, facial/tongue/throat swelling, SOB or lightheadedness with hypotension: no Has patient had a PCN reaction causing severe rash involving mucus membranes or skin necrosis: yes Has patient had a PCN reaction that required hospitalization: no Has patient had a PCN reaction occurring within the last 10 years: no If all of the above answers are NO, then may proceed with Cephalosporin use.    Sulfa Antibiotics Rash    Under arm and thighs     Review of Systems:  No headache, visual changes, nausea, vomiting, diarrhea, constipation, dizziness, abdominal pain, skin rash, fevers, chills, night sweats, weight loss, swollen lymph nodes, body aches, joint swelling, chest pain, shortness of breath, mood changes. POSITIVE muscle aches  Objective  Blood pressure 108/68, height 5' 4 (1.626 m), weight 108 lb (49 kg).   General: No apparent distress alert and oriented x3 mood and affect normal, dressed appropriately.  HEENT: Pupils equal, extraocular movements intact  Respiratory: Patient's speak in full sentences and does not appear short of breath  Cardiovascular: No lower extremity edema, non tender, no erythema  Gait normal  MSK:  Back normal does have significant loss of lordosis noted.  Patient does have tightness noted in the parascapular  area.  Negative straight leg test noted today.  Patient denies any neck pain mild discomfort  Osteopathic findings  T3 extended rotated and side bent right  T9 extended rotated and side bent left L2 flexed rotated and side bent right L3 flexed rotated and side bent left L5 flexed rotated side bent left Sacrum right on right    Assessment and Plan:  Degenerative lumbar spinal stenosis DDD noted discussed which activities  No need to change anything  RTC in 6-8 weeks   SI  (sacroiliac) joint dysfunction Discussed HEP stable     Nonallopathic problems  Decision today to treat with OMT was based on Physical Exam  After verbal consent patient was treated with HVLA, ME, FPR techniques in thoracic, lumbar, and sacral  areas  Patient tolerated the procedure well with improvement in symptoms  Patient given exercises, stretches and lifestyle modifications  See medications in patient instructions if given  Patient will follow up in 4-8 weeks     The above documentation has been reviewed and is accurate and complete Kelsey Mcconathy M Natha Guin, DO         Note: This dictation was prepared with Dragon dictation along with smaller phrase technology. Any transcriptional errors that result from this process are unintentional.

## 2024-02-06 ENCOUNTER — Encounter: Payer: Self-pay | Admitting: Family Medicine

## 2024-02-06 ENCOUNTER — Ambulatory Visit: Admitting: Family Medicine

## 2024-02-06 VITALS — BP 108/68 | Ht 64.0 in | Wt 108.0 lb

## 2024-02-06 DIAGNOSIS — M9902 Segmental and somatic dysfunction of thoracic region: Secondary | ICD-10-CM

## 2024-02-06 DIAGNOSIS — M9904 Segmental and somatic dysfunction of sacral region: Secondary | ICD-10-CM

## 2024-02-06 DIAGNOSIS — M9903 Segmental and somatic dysfunction of lumbar region: Secondary | ICD-10-CM | POA: Diagnosis not present

## 2024-02-06 DIAGNOSIS — M48061 Spinal stenosis, lumbar region without neurogenic claudication: Secondary | ICD-10-CM | POA: Diagnosis not present

## 2024-02-06 DIAGNOSIS — M533 Sacrococcygeal disorders, not elsewhere classified: Secondary | ICD-10-CM | POA: Diagnosis not present

## 2024-02-06 NOTE — Assessment & Plan Note (Signed)
 Discussed HEP stable

## 2024-02-06 NOTE — Assessment & Plan Note (Signed)
 DDD noted discussed which activities  No need to change anything  RTC in 6-8 weeks

## 2024-02-06 NOTE — Patient Instructions (Signed)
 Good to see you! Get the grocery's home See you again in 1-2 months

## 2024-02-12 ENCOUNTER — Encounter: Admitting: Family Medicine

## 2024-02-12 ENCOUNTER — Ambulatory Visit (INDEPENDENT_AMBULATORY_CARE_PROVIDER_SITE_OTHER): Admitting: Family Medicine

## 2024-02-12 ENCOUNTER — Encounter: Payer: Self-pay | Admitting: Family Medicine

## 2024-02-12 DIAGNOSIS — Z91199 Patient's noncompliance with other medical treatment and regimen due to unspecified reason: Secondary | ICD-10-CM

## 2024-02-12 DIAGNOSIS — Z0000 Encounter for general adult medical examination without abnormal findings: Secondary | ICD-10-CM

## 2024-02-12 DIAGNOSIS — Z1231 Encounter for screening mammogram for malignant neoplasm of breast: Secondary | ICD-10-CM

## 2024-02-12 NOTE — Progress Notes (Signed)
   No show for Cpe- combined Chronic Conditions/illness Management

## 2024-02-12 NOTE — Patient Instructions (Addendum)

## 2024-02-14 ENCOUNTER — Encounter: Payer: Self-pay | Admitting: Family Medicine

## 2024-03-04 ENCOUNTER — Encounter: Payer: Self-pay | Admitting: Family Medicine

## 2024-03-04 ENCOUNTER — Ambulatory Visit (INDEPENDENT_AMBULATORY_CARE_PROVIDER_SITE_OTHER): Admitting: Family Medicine

## 2024-03-04 VITALS — BP 100/60 | HR 88 | Temp 97.6°F | Ht 64.0 in | Wt 106.4 lb

## 2024-03-04 DIAGNOSIS — Z1231 Encounter for screening mammogram for malignant neoplasm of breast: Secondary | ICD-10-CM

## 2024-03-04 DIAGNOSIS — E782 Mixed hyperlipidemia: Secondary | ICD-10-CM

## 2024-03-04 DIAGNOSIS — M810 Age-related osteoporosis without current pathological fracture: Secondary | ICD-10-CM | POA: Diagnosis not present

## 2024-03-04 DIAGNOSIS — K219 Gastro-esophageal reflux disease without esophagitis: Secondary | ICD-10-CM | POA: Diagnosis not present

## 2024-03-04 DIAGNOSIS — Z0000 Encounter for general adult medical examination without abnormal findings: Secondary | ICD-10-CM

## 2024-03-04 DIAGNOSIS — E559 Vitamin D deficiency, unspecified: Secondary | ICD-10-CM

## 2024-03-04 LAB — COMPREHENSIVE METABOLIC PANEL WITH GFR
ALT: 18 U/L (ref 0–35)
AST: 23 U/L (ref 0–37)
Albumin: 4.2 g/dL (ref 3.5–5.2)
Alkaline Phosphatase: 72 U/L (ref 39–117)
BUN: 17 mg/dL (ref 6–23)
CO2: 30 meq/L (ref 19–32)
Calcium: 8.8 mg/dL (ref 8.4–10.5)
Chloride: 100 meq/L (ref 96–112)
Creatinine, Ser: 0.62 mg/dL (ref 0.40–1.20)
GFR: 85.34 mL/min (ref >60.00)
Glucose, Bld: 102 mg/dL — ABNORMAL HIGH (ref 70–99)
Potassium: 4.5 meq/L (ref 3.5–5.1)
Sodium: 139 meq/L (ref 135–145)
Total Bilirubin: 0.5 mg/dL (ref 0.2–1.2)
Total Protein: 6.8 g/dL (ref 6.0–8.3)

## 2024-03-04 LAB — LIPID PANEL
Cholesterol: 256 mg/dL — ABNORMAL HIGH (ref 0–200)
HDL: 94.4 mg/dL (ref >39.00)
LDL Cholesterol: 143 mg/dL — ABNORMAL HIGH (ref 0–99)
NonHDL: 161.21
Total CHOL/HDL Ratio: 3
Triglycerides: 90 mg/dL (ref 0.0–149.0)
VLDL: 18 mg/dL (ref 0.0–40.0)

## 2024-03-04 LAB — CBC
HCT: 37.5 % (ref 36.0–46.0)
Hemoglobin: 12.5 g/dL (ref 12.0–15.0)
MCHC: 33.4 g/dL (ref 30.0–36.0)
MCV: 93.7 fl (ref 78.0–100.0)
Platelets: 250 K/uL (ref 150.0–400.0)
RBC: 4 Mil/uL (ref 3.87–5.11)
RDW: 14.3 % (ref 11.5–15.5)
WBC: 4.1 K/uL (ref 4.0–10.5)

## 2024-03-04 LAB — VITAMIN D 25 HYDROXY (VIT D DEFICIENCY, FRACTURES): VITD: 30.49 ng/mL (ref 30.00–100.00)

## 2024-03-04 LAB — TSH: TSH: 2.68 u[IU]/mL (ref 0.35–5.50)

## 2024-03-04 LAB — HEMOGLOBIN A1C: Hgb A1c MFr Bld: 6 % (ref 4.6–6.5)

## 2024-03-04 NOTE — Patient Instructions (Signed)

## 2024-03-04 NOTE — Progress Notes (Signed)
 Patient ID: Kelsey [name], female  DOB: 10-Sep-1945, 78 y.o.   MRN: [id number] Patient Care Team    Relationship Specialty Notifications Start End  Catherine Charlies LABOR, DO PCP - General Family Medicine  11/14/22   Kristie Lamprey, MD Consulting Physician Gastroenterology  12/08/15   Camillo Golas, MD Consulting Physician Ophthalmology  12/08/15   Claudene Arthea HERO, DO Consulting Physician Family Medicine  04/07/18   Georjean Darice HERO, MD Consulting Physician Neurology  01/23/21     Chief Complaint  Patient presents with   Annual Exam    Chronic Conditions/illness Management Prolia  due Jan 2026 Pt not fasting    Subjective: Kelsey [name] is a 78 y.o.  Female  present for CPE and Chronic Conditions/illness Management . All past medical history, surgical history, allergies, family history, immunizations, medications and social history were updated in the electronic medical record today. All recent labs, ED visits and hospitalizations within the last year were reviewed.  Health Maintenance: Mammogram: completed: UTD 06/12/2023 ordered for 2025 [facility] Immunizations: tdap UTD 2022, Influenza UTD  (encouraged yearly), PNA series completed, Shingrix completed Infectious disease screening: Hep C completed DEXA: last completed 02/18/2023, result -3.0, [address] Assistive device: None Oxygen use: None Patient has a Dental home. Hospitalizations/ED visits: Reviewed  Osteoporosis/vit d def: Is compliant with Prolia  every 6 months for her osteoporosis.  DEXA is due 2026.  She compliant with her vitamin D  supplementation. Next Prolia  due 07/2024   Hyperlipidemia: Patient reports compliance with Lipitor 10 mg  GERD: Reports her reflux is well-controlled with use of omeprazole  daily, without symptoms return causing chest discomfort and mild cough.       03/04/2024    9:20 AM 10/16/2023    8:56 AM 10/03/2022    9:05 AM 06/13/2022   10:58 AM 10/17/2021    8:46 AM  Depression screen PHQ  2/9  Decreased Interest 0 0 0 0 0  Down, Depressed, Hopeless 0 0 0 0 0  PHQ - 2 Score 0 0 0 0 0  Altered sleeping  0     Tired, decreased energy  0     Change in appetite  0     Feeling bad or failure about yourself   0     Trouble concentrating  0     Moving slowly or fidgety/restless  0     Suicidal thoughts  0     PHQ-9 Score  0     Difficult doing work/chores  Not difficult at all         10/03/2022    9:07 AM 11/03/2022    4:11 PM 10/15/2023    8:19 PM 10/16/2023    8:52 AM 03/04/2024    9:20 AM  Fall Risk  Falls in the past year? 1 1 0 0 0  Was there an injury with Fall? 1 1 0 0 0  Was there an injury with Fall? - Comments head      Fall Risk Category Calculator 3 2 0  0 0  Patient at Risk for Falls Due to Impaired vision    No Fall Risks  Fall risk Follow up Falls prevention discussed   Falls evaluation completed;Education provided;Falls prevention discussed Falls evaluation completed     Patient-reported       08/19/2020   10:28 AM  GAD 7 : Generalized Anxiety Score  Nervous, Anxious, on Edge 2  Control/stop worrying 2  Worry too much - different things 2  Trouble relaxing 2  Restless 1  Easily annoyed or irritable 1  Afraid - awful might happen 2  Total GAD 7 Score 12   Immunization History  Administered Date(s) Administered   Fluad Quad(high Dose 65+) 04/14/2019, 08/19/2020, 05/08/2021, 03/21/2022   Fluad Trivalent(High Dose 65+) 04/04/2023   Hepatitis A, Adult 03/23/2014   INFLUENZA, HIGH DOSE SEASONAL PF 05/27/2015, 04/12/2016, 04/02/2017, 04/07/2018   Influenza Whole 04/09/2011   Influenza,inj,Quad PF,6+ Mos 03/19/2014   Moderna Covid-19 Vaccine Bivalent Booster 43yrs & up 05/25/2021   PFIZER(Purple Top)SARS-COV-2 Vaccination 07/20/2019, 08/19/2019   Pneumococcal Conjugate-13 12/02/2013, 04/02/2014   Pneumococcal Polysaccharide-23 12/08/2015   Tdap 12/02/2013, 03/08/2021   Zoster Recombinant(Shingrix) 06/13/2020, 08/19/2020   Past Medical History:   Diagnosis Date   Abdominal pain, left lower quadrant 06/10/2023   Abnormal mini-mental status exam 07/05/2016   Allergy    Anemia    iron deficiency   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   Bladder prolapse, female, acquired    Chicken pox as a child   De Quervain's tenosynovitis, left 12/11/2017   Dysuria 09/13/2011   Female bladder prolapse 09/13/2011   Fibrocystic breast 09/14/2011   GERD (gastroesophageal reflux disease)    H/O measles    H/O mumps    History of iron deficiency anemia 12/02/2013   Hyperlipidemia    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    Uterine fibroid 11/2012   See Dr. Darcel note, surgery 11/2012; calcified- multiple   Vaginitis 09/13/2011   Allergies  Allergen Reactions   Penicillins Rash    Has patient had a PCN reaction causing immediate rash, facial/tongue/throat swelling, SOB or lightheadedness with hypotension: no Has patient had a PCN reaction causing severe rash involving mucus membranes or skin necrosis: yes Has patient had a PCN reaction that required hospitalization: no Has patient had a PCN reaction occurring within the last 10 years: no If all of the above answers are NO, then may proceed with Cephalosporin use.    Sulfa Antibiotics Rash    Under arm and thighs   Past Surgical History:  Procedure Laterality Date   DILATATION & CURRETTAGE/HYSTEROSCOPY WITH RESECTOCOPE N/A 11/06/2012   Procedure: DILATATION & CURETTAGE/HYSTEROSCOPY WITH RESECTOCOPE;  Surgeon: Nena DELENA Darcel, MD;  Location: [facility];  Service: Gynecology;  Laterality: N/A;   HERNIA REPAIR  1992   I & D EXTREMITY Left 03/08/2021   Procedure: IRRIGATION AND DEBRIDEMENT OF THUMB AND ARM;  Surgeon: Murrell Drivers, MD;  Location: [facility] OR;  Service: Orthopedics;  Laterality: Left;   small intestine hernia  1994   WISDOM TOOTH EXTRACTION     Family History  Problem Relation Age of Onset   Heart disease Mother    Osteoporosis Mother    Cancer Father        prostate   Osteoporosis Sister    Osteoporosis Sister    Gout Brother    Gout Maternal Grandmother    Heart disease Maternal Grandmother    Alzheimer's disease Maternal Grandfather    Colon cancer Neg Hx    Colon polyps Neg Hx    Esophageal cancer Neg Hx    Rectal cancer Neg Hx    Stomach cancer Neg  Hx    Social History   Social History Narrative   Right handed    Lives with husband     Allergies as of 03/04/2024       Reactions   Penicillins Rash   Has patient had a PCN reaction causing immediate rash, facial/tongue/throat swelling, SOB or lightheadedness with hypotension: no Has patient had a PCN reaction causing severe rash involving mucus membranes or skin necrosis: yes Has patient had a PCN reaction that required hospitalization: no Has patient had a PCN reaction occurring within the last 10 years: no If all of the above answers are NO, then may proceed with Cephalosporin use.   Sulfa Antibiotics Rash   Under arm and thighs        Medication List        Accurate as of March 04, 2024  9:42 AM. If you have any questions, ask your nurse or doctor.          atorvastatin  10 MG tablet Commonly known as: LIPITOR TAKE 1 TABLET EVERY EVENING   CALCIUM  1200 PO Take 1,200 mg by mouth daily.   omeprazole  40 MG capsule Commonly known as: PRILOSEC TAKE 1 CAPSULE DAILY.   Vitamin D  (Ergocalciferol ) 1.25 MG (50000 UNIT) Caps capsule Commonly known as: DRISDOL  TAKE 1 CAPSULE EVERY 7 DAYS        All past medical history, surgical history, allergies, family history, immunizations andmedications were updated in the EMR today and reviewed under the history and medication portions of their EMR.     No results  found for this or any previous visit (from the past 2160 hours).  Review of Systems  All other systems reviewed and are negative.  14 pt review of systems performed and negative (unless mentioned in an HPI)  Objective: BP 100/60   Pulse 88   Temp 97.6 F (36.4 C)   Ht 5' 4 (1.626 m)   Wt 106 lb 6.4 oz (48.3 kg)   SpO2 98%   BMI 18.26 kg/m  Physical Exam Vitals and nursing note reviewed.  Constitutional:      General: She is not in acute distress.    Appearance: Normal appearance. She is not ill-appearing or toxic-appearing.  HENT:     Head: Normocephalic and atraumatic.     Right Ear: Tympanic membrane, ear canal and external ear normal. There is no impacted cerumen.     Left Ear: Tympanic membrane, ear canal and external ear normal. There is no impacted cerumen.     Nose: No congestion or rhinorrhea.     Mouth/Throat:     Mouth: Mucous membranes are moist.     Pharynx: Oropharynx is clear. No oropharyngeal exudate or posterior oropharyngeal erythema.  Eyes:     General: No scleral icterus.       Right eye: No discharge.        Left eye: No discharge.     Extraocular Movements: Extraocular movements intact.     Conjunctiva/sclera: Conjunctivae normal.     Pupils: Pupils are equal, round, and reactive to light.  Cardiovascular:     Rate and Rhythm: Normal rate and regular rhythm.     Pulses: Normal pulses.     Heart sounds: Normal heart sounds. No murmur heard.    No friction rub. No gallop.  Pulmonary:     Effort: Pulmonary effort is normal. No respiratory distress.     Breath sounds: Normal breath sounds. No stridor. No wheezing, rhonchi or rales.  Chest:  Chest wall: No tenderness.  Abdominal:     General: Abdomen is flat. Bowel sounds are normal. There is no distension.     Palpations: Abdomen is soft. There is no mass.     Tenderness: There is no abdominal tenderness. There is no right CVA tenderness, left CVA tenderness, guarding or rebound.     Hernia: No  hernia is present.  Musculoskeletal:        General: No swelling, tenderness or deformity. Normal range of motion.     Cervical back: Normal range of motion and neck supple. No rigidity or tenderness.     Right lower leg: No edema.     Left lower leg: No edema.  Lymphadenopathy:     Cervical: No cervical adenopathy.  Skin:    General: Skin is warm and dry.     Coloration: Skin is not jaundiced or pale.     Findings: No bruising, erythema, lesion or rash.  Neurological:     General: No focal deficit present.     Mental Status: She is alert and oriented to person, place, and time. Mental status is at baseline.     Cranial Nerves: No cranial nerve deficit.     Sensory: No sensory deficit.     Motor: No weakness.     Coordination: Coordination normal.     Gait: Gait normal.     Deep Tendon Reflexes: Reflexes normal.  Psychiatric:        Mood and Affect: Mood normal.        Behavior: Behavior normal.        Thought Content: Thought content normal.        Judgment: Judgment normal.      Assessment/plan: IRINA OKELLY is a 78 y.o. female present for CPE and routine chronic conditions B12 deficiency/vitamin D  deficiency  vitamin D  levels collected today Patient is supplementation  Mixed hyperlipidemia/ Encounter for monitoring statin therapy Stable Continue atorvastatin  for added cardiovascular protection    Osteoporosis without current pathological fracture, unspecified osteoporosis type/Vitamin D  deficiency DEXA due 2026 Continue  Prolia  injection every 6 months- next 07/2024 Continue  vitamin D  supplementation Vitamin D  collected today  GERD long-term proton pump inhibitor use: Stable Continue omeprazole  40 mg daily  Breast cancer screening by mammogram - MM 3D SCREENING MAMMOGRAM BILATERAL BREAST; Future> ordered  Diabetes mellitus screening - Hemoglobin A1c> collected  Routine general medical examination at a health care facility Patient was encouraged to  exercise greater than 150 minutes a week. Patient was encouraged to choose a diet filled with fresh fruits and vegetables, and lean meats. AVS provided to patient today for education/recommendation on gender specific health and safety maintenance. Mammogram: completed: UTD 06/12/2023 ordered for 2025 [facility] Immunizations: tdap UTD 2022, Influenza UTD  (encouraged yearly), PNA series completed, Shingrix completed Infectious disease screening: Hep C completed DEXA: last completed 02/18/2023, result -3.0, [address]  Return in about 1 year (around 03/05/2025) for cpe (20 min), Routine chronic condition follow-up.  Orders Placed This Encounter  Procedures   MM 3D SCREENING MAMMOGRAM BILATERAL BREAST   CBC   Comprehensive metabolic panel with GFR   Hemoglobin A1c   Lipid panel   TSH   Vitamin D  (25 hydroxy)    No orders of the defined types were placed in this encounter.  Referral Orders  No referral(s) requested today     Electronically signed by: Charlies Bellini, DO [facility]

## 2024-03-05 ENCOUNTER — Encounter: Payer: Self-pay | Admitting: Family Medicine

## 2024-03-05 ENCOUNTER — Ambulatory Visit: Payer: Self-pay | Admitting: Family Medicine

## 2024-03-05 NOTE — Progress Notes (Signed)
 [facility] [address] [place] [address] Phone: [phone] Subjective:   Kelsey [name], am serving as a scribe for Dr. Arthea Claudene.  I'm seeing this patient by the request  of:  [name], [name], DO  CC: Back and neck pain follow-up  YEP:Dlagzrupcz  Makyra MAIREN WALLENSTEIN is a 78 y.o. female coming in with complaint of back and neck pain. OMT 02/06/2024. Patient states that she typically has pain in L SI joint but now she has pain in R hip over ASIS. Pain radiates into the glute and down into the groin. Has been stretching out back and this seems to help lumbar spine but not her hip.   Medications patient has been prescribed: None           Reviewed prior external information including notes and imaging from previsou exam, outside providers and external EMR if available.   As well as notes that were available from care everywhere and other healthcare systems.  Past medical history, social, surgical and family history all reviewed in electronic medical record.  No pertanent information unless stated regarding to the chief complaint.   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    Uterine fibroid 11/2012   See Dr. Darcel note, surgery 11/2012; calcified- multiple   Vaginitis 09/13/2011    Allergies  Allergen Reactions   Penicillins Rash    Has patient had a PCN reaction causing immediate rash, facial/tongue/throat swelling, SOB or lightheadedness with hypotension: no Has patient had a PCN reaction causing severe rash involving mucus membranes or skin necrosis: yes Has patient had a PCN reaction that required hospitalization: no Has patient had a PCN reaction occurring within the last 10 years: no If all of the above answers are NO, then may proceed with Cephalosporin use.    Sulfa Antibiotics Rash    Under arm and thighs     Review of Systems:  No headache, visual changes, nausea, vomiting, diarrhea, constipation, dizziness, abdominal pain, skin rash, fevers, chills, night sweats, weight loss, swollen lymph nodes, body aches, joint swelling, chest pain, shortness of breath, mood changes. POSITIVE muscle aches  Objective  Blood pressure 96/62, pulse 81, height 5' 4 (1.626 m), weight 106 lb (48.1 kg), SpO2 97%.   General: No apparent distress alert and oriented x3 mood and affect normal, dressed appropriately.  HEENT: Pupils equal, extraocular movements intact  Respiratory: Patient's speak in full sentences and does not appear short of breath  Cardiovascular: No lower extremity edema, non tender, no erythema  Gait MSK:  Back significant arthritic changes noted.  Does have some loss of range of motion as well provide significant tightness of the hip flexors bilaterally but does seem to be right greater than left.  Osteopathic findings   T3 extended rotated and side bent right inhaled rib T9 extended rotated and side bent left L2 flexed rotated and side bent right L3 flexed rotated and side bent left L4 flexed rotated sidebent right Sacrum right on right       Assessment and Plan:  Degenerative  lumbar spinal stenosis Degenerative disc disease still noted.  Does continue to responded relatively well to the muscle energy approach with osteopathic manipulation and the strengthening exercises.  Discussed avoiding certain activities.  Increase activity slowly.  Follow-up again in 2 months  Osteoporosis Continue same treatment    Nonallopathic problems  Decision today to treat with OMT was based on Physical Exam  After verbal consent patient was treated with HVLA, ME, FPR techniques in  thoracic, lumbar, and sacral  areas  Patient tolerated the procedure well with improvement in symptoms  Patient given exercises, stretches and lifestyle modifications  See medications in patient instructions if given  Patient will follow up in 4-8 weeks     The above documentation has been reviewed and is accurate and complete Yahshua Thibault M Mubashir Mallek, DO         Note: This dictation was prepared with Dragon dictation along with smaller phrase technology. Any transcriptional errors that result from this process are unintentional.

## 2024-03-10 ENCOUNTER — Ambulatory Visit (INDEPENDENT_AMBULATORY_CARE_PROVIDER_SITE_OTHER): Admitting: Family Medicine

## 2024-03-10 ENCOUNTER — Encounter: Payer: Self-pay | Admitting: Family Medicine

## 2024-03-10 VITALS — BP 96/62 | HR 81 | Ht 64.0 in | Wt 106.0 lb

## 2024-03-10 DIAGNOSIS — M9904 Segmental and somatic dysfunction of sacral region: Secondary | ICD-10-CM

## 2024-03-10 DIAGNOSIS — M48061 Spinal stenosis, lumbar region without neurogenic claudication: Secondary | ICD-10-CM | POA: Diagnosis not present

## 2024-03-10 DIAGNOSIS — M9902 Segmental and somatic dysfunction of thoracic region: Secondary | ICD-10-CM | POA: Diagnosis not present

## 2024-03-10 DIAGNOSIS — M9903 Segmental and somatic dysfunction of lumbar region: Secondary | ICD-10-CM

## 2024-03-10 DIAGNOSIS — M810 Age-related osteoporosis without current pathological fracture: Secondary | ICD-10-CM

## 2024-03-10 DIAGNOSIS — M9908 Segmental and somatic dysfunction of rib cage: Secondary | ICD-10-CM | POA: Diagnosis not present

## 2024-03-10 NOTE — Patient Instructions (Addendum)
 Bring back a sloth, but if you can't a picture will do Good to see you!  See you again in 6-8 weeks

## 2024-03-10 NOTE — Assessment & Plan Note (Signed)
Continue same treatment

## 2024-03-10 NOTE — Assessment & Plan Note (Signed)
 Degenerative disc disease still noted.  Does continue to responded relatively well to the muscle energy approach with osteopathic manipulation and the strengthening exercises.  Discussed avoiding certain activities.  Increase activity slowly.  Follow-up again in 2 months

## 2024-04-01 ENCOUNTER — Ambulatory Visit: Payer: Self-pay

## 2024-04-01 ENCOUNTER — Ambulatory Visit (INDEPENDENT_AMBULATORY_CARE_PROVIDER_SITE_OTHER): Admitting: Family Medicine

## 2024-04-01 ENCOUNTER — Encounter: Payer: Self-pay | Admitting: Family Medicine

## 2024-04-01 VITALS — BP 89/51 | HR 81 | Temp 98.9°F | Ht 64.0 in | Wt 107.0 lb

## 2024-04-01 DIAGNOSIS — R197 Diarrhea, unspecified: Secondary | ICD-10-CM

## 2024-04-01 MED ORDER — CIPROFLOXACIN HCL 500 MG PO TABS: 500.0000 mg | ORAL_TABLET | Freq: Two times a day (BID) | ORAL | 0 refills | Status: AC

## 2024-04-01 NOTE — Progress Notes (Signed)
 OFFICE VISIT  04/01/2024  CC:  Chief Complaint  Patient presents with   Diarrhea    Recently traveled to [place], returned Fri night; ate greens beans while away, gave her gas. Unsure when diarrhea started   Patient is a 78 y.o. female who presents for diarrhea.  HPI: She went to [place], came back about 6 days ago.  The day before coming back she felt bloated and gassy and sometime around then started to develop watery diarrhea.  Happens every time she eats.  No nausea but feels indigestion in the upper abdomen and up into the esophagus region. No fever.  No blood in stool. A friend who went with her has developed the same symptoms.  She recalls eating some beans as well as some salad.  She drank only bottled water.  No rash, no jaundice.  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    Uterine fibroid 11/2012   See Dr. Darcel note, surgery 11/2012; calcified- multiple   Vaginitis 09/13/2011    Past Surgical History:  Procedure Laterality Date   DILATATION & CURRETTAGE/HYSTEROSCOPY WITH RESECTOCOPE N/A 11/06/2012   Procedure: DILATATION & CURETTAGE/HYSTEROSCOPY WITH  RESECTOCOPE;  Surgeon: Nena DELENA Darcel, MD;  Location: [facility];  Service: Gynecology;  Laterality: N/A;   HERNIA REPAIR  1992   I & D EXTREMITY Left 03/08/2021   Procedure: IRRIGATION AND DEBRIDEMENT OF THUMB AND ARM;  Surgeon: Murrell Drivers, MD;  Location: [facility];  Service: Orthopedics;  Laterality: Left;   small intestine hernia  1994   WISDOM TOOTH EXTRACTION      Outpatient Medications Prior to Visit  Medication Sig Dispense Refill   atorvastatin  (LIPITOR) 10 MG tablet TAKE 1 TABLET EVERY EVENING 30 tablet 0   Calcium  Carbonate-Vit D-Min (CALCIUM  1200 PO) Take 1,200 mg by mouth daily.     omeprazole  (PRILOSEC) 40 MG capsule TAKE 1 CAPSULE DAILY. 90 capsule 3   Vitamin D , Ergocalciferol , (DRISDOL ) 1.25 MG (50000 UNIT) CAPS capsule TAKE 1 CAPSULE EVERY 7 DAYS 12 capsule 0   Facility-Administered Medications Prior to Visit  Medication Dose Route Frequency Provider Last Rate Last Admin   denosumab  (PROLIA ) injection 60 mg  60 mg Subcutaneous Q6 months [name], [name], DO   60 mg at 01/07/24 1332    Allergies  Allergen Reactions   Penicillins Rash    Has patient had a PCN reaction causing immediate rash, facial/tongue/throat swelling, SOB or lightheadedness with hypotension: no Has patient had a PCN reaction causing severe rash involving mucus membranes or skin necrosis: yes Has patient had  a PCN reaction that required hospitalization: no Has patient had a PCN reaction occurring within the last 10 years: no If all of the above answers are NO, then may proceed with Cephalosporin use.    Sulfa Antibiotics Rash    Under arm and thighs    Review of Systems  As per HPI  PE:    04/01/2024    3:29 PM 03/10/2024   11:05 AM 03/04/2024    9:14 AM  Vitals with BMI  Height 5' 4 5' 4 5' 4  Weight 107 lbs 106 lbs 106 lbs 6 oz  BMI 18.36 18.19 18.25  Systolic 89 96 100  Diastolic 51 62 60  Pulse 81 81 88   Exam chaperoned by Jesusa Hahn, CMA   Physical Exam  Gen: Alert, well  appearing.  Patient is oriented to person, place, time, and situation. AFFECT: pleasant, lucid thought and speech. No icterus Oral mucosa pink and moist.  No pharyngeal swelling or erythema. Lungs are clear bilaterally, breathing nonlabored. Cardiovascular: Regular rhythm and rate without murmur. Abdomen is soft, mild diffuse lower abdominal tenderness to palpation.  No guarding or rebound.  Bowel sounds are normal. Skin no rash, no jaundice.  LABS:  Last CBC Lab Results  Component Value Date   WBC 4.1 03/04/2024   HGB 12.5 03/04/2024   HCT 37.5 03/04/2024   MCV 93.7 03/04/2024   MCH 31.3 03/08/2021   RDW 14.3 03/04/2024   PLT 250.0 03/04/2024   Last metabolic panel Lab Results  Component Value Date   GLUCOSE 102 (H) 03/04/2024   NA 139 03/04/2024   K 4.5 03/04/2024   CL 100 03/04/2024   CO2 30 03/04/2024   BUN 17 03/04/2024   CREATININE 0.62 03/04/2024   GFR 85.34 03/04/2024   CALCIUM  8.8 03/04/2024   PHOS 3.6 11/28/2012   PROT 6.8 03/04/2024   ALBUMIN 4.2 03/04/2024   BILITOT 0.5 03/04/2024   ALKPHOS 72 03/04/2024   AST 23 03/04/2024   ALT 18 03/04/2024   ANIONGAP 6 08/05/2016   IMPRESSION AND PLAN:  Diarrhea, presumed infectious. Will treat for traveler's diarrhea--> Cipro  500 twice daily x 3 days. Over-the-counter Imodium discussed. Bland diet recommended, handout given.  An After Visit Summary was printed and given to the patient.  FOLLOW UP: Return if symptoms worsen or fail to improve.  Signed:  Gerlene Hockey, MD           04/01/2024

## 2024-04-01 NOTE — Telephone Encounter (Signed)
 FYI Only or Action Required?: FYI only for provider.  Patient was last seen in primary care on 03/04/2024 by Catherine Fuller A, DO.  Called Nurse Triage reporting Diarrhea.  Symptoms began several days ago.  Interventions attempted: Rest, hydration, or home remedies.  Symptoms are: unchanged.  Triage Disposition: See Physician Within 24 Hours (overriding Call PCP Within 24 Hours)  Patient/caregiver understands and will follow disposition?:    Message from Chiquita SQUIBB sent at 04/01/2024 11:15 AM EDT  Summary: Diarrhea   Patient is calling in with diarrhea, patient states this has been going on for 6 days.      Reason for Disposition  Travel to a foreign country in past month  Answer Assessment - Initial Assessment Questions Additional info: Requesting evaluation today. Scheduled acute with alternate provider.    1. DIARRHEA SEVERITY: How bad is the diarrhea? How many more stools have you had in the past 24 hours than normal?      3 times per day, about 1 hour after eating she needs to have bm.  2. ONSET: When did the diarrhea begin?      6 days 3. STOOL DESCRIPTION:  How loose or watery is the diarrhea? What is the stool color? Is there any blood or mucous in the stool?     loose 4. VOMITING: Are you also vomiting? If Yes, ask: How many times in the past 24 hours?      denies 5. ABDOMEN PAIN: Are you having any abdomen pain? If Yes, ask: What does it feel like? (e.g., crampy, dull, intermittent, constant)      denies 6. ABDOMEN PAIN SEVERITY: If present, ask: How bad is the pain?  (e.g., Scale 1-10; mild, moderate, or severe)     0/10 7. ORAL INTAKE: If vomiting, Have you been able to drink liquids? How much liquids have you had in the past 24 hours?     Not vomiting 8. HYDRATION: Any signs of dehydration? (e.g., dry mouth [not just dry lips], too weak to stand, dizziness, new weight loss) When did you last urinate?     hydrated 9. EXPOSURE:  Have you traveled to a foreign country recently? Have you been exposed to anyone with diarrhea? Could you have eaten any food that was spoiled?     [place]  10. ANTIBIOTIC USE: Are you taking antibiotics now or have you taken antibiotics in the past 2 months?        11. OTHER SYMPTOMS: Do you have any other symptoms? (e.g., fever, blood in stool)    Denies  Protocols used: Diarrhea-A-AH

## 2024-04-01 NOTE — Patient Instructions (Signed)
Bland Diet A bland diet may consist of soft foods or foods that are not high in fat or are not greasy, acidic, or spicy. Avoiding certain foods may cause less irritation to your mouth, throat, stomach, or gastrointestinal tract. Avoiding certain foods may make you feel better. Everyone's tolerances are different. A bland diet should be based on what you can tolerate and what may cause discomfort. What is my plan? Your health care provider or dietitian may recommend specific changes to your diet to treat your symptoms. These changes may include: Eating small meals frequently. Cooking food until it is soft enough to chew easily. Taking the time to chew your food thoroughly, so it is easy to swallow and digest. Avoiding foods that cause you discomfort. These may include spicy food, fried food, greasy foods, hard-to-chew foods, or citrus fruits and juices. Drinking slowly. What are tips for following this plan? Reading food labels To reduce fiber intake, look for food labels that say "whole," such as whole wheat or whole grain. Shopping Avoid food items that may have nuts or seeds. Avoid vegetables that may make you gassy or have a tough texture, such as broccoli, cauliflower, or corn. Cooking Cook foods thoroughly so they have a soft texture. Meal planning Make sure you include foods from all food groups to eat a balanced diet. Eat a variety of types of foods. Eat foods and drink beverages that do not cause you discomfort. These may include soups and broths with cooked meats, pasta, and vegetables. Lifestyle Sit up after meals, avoid tight clothing, and take time to eat and chew your food slowly. Ask your health care provider whether you should take dietary supplements. General information Mildly season your foods. Some seasonings, such as cayenne pepper, vinegar, or hot sauce, may cause irritation. The foods, beverages, or seasonings to avoid should be based on individual tolerance. What  foods should I eat? Fruits Canned or cooked fruit such as peaches, pears, or applesauce. Bananas. Vegetables Well-cooked vegetables. Canned or cooked vegetables such as carrots, green beans, beets, or spinach. Mashed or boiled potatoes. Grains  Hot cereals, such as cream of wheat and processed oatmeal. Rice. Bread, crackers, pasta, or tortillas made from refined white flour. Meats and other proteins  Eggs. Creamy peanut butter or other nut butters. Lean, well-cooked tender meats, such as beef, pork, chicken, or fish. Dairy Low-fat dairy products such as milk, cottage cheese, or yogurt. Beverages  Water. Herbal tea. Apple juice. Fats and oils Mild salad dressings. Canola or olive oil. Sweets and desserts Low-fat pudding, custard, or ice cream. Fruit gelatin. The items listed above may not be a complete list of foods and beverages you can eat. Contact a dietitian for more information. What foods should I avoid? Fruits Citrus fruits, such as oranges and grapefruit. Fruits with a stringy texture. Fruits that have lots of seeds, such as kiwi or strawberries. Dried fruits. Vegetables Raw, uncooked vegetables. Salads. Grains Whole grain breads, muffins, and cereals. Meats and other proteins Tough, fibrous meats. Highly seasoned meat such as corned beef, smoked meats, or fish. Processed high-fat meats such as brats, hot dogs, or sausage. Dairy Full-fat dairy foods such as ice cream and cheese. Beverages Caffeinated drinks. Alcohol. Seasonings and condiments Strongly flavored seasonings or condiments. Hot sauce. Salsa. Other foods Spicy foods. Fried or greasy foods. Sour foods, such as pickled or fermented foods like sauerkraut. Foods high in fiber. The items listed above may not be a complete list of foods and beverages you should  avoid. Contact a dietitian for more information. Summary A bland diet should be based on individual tolerance. It may consist of foods that are soft  textured and do not have a lot of fat, fiber, acid, or seasonings. A bland diet may be recommended because avoiding certain foods, beverages, or spices may make you feel better. This information is not intended to replace advice given to you by your health care provider. Make sure you discuss any questions you have with your health care provider. Document Revised: 05/15/2021 Document Reviewed: 05/15/2021 Elsevier Patient Education  2024 [employer].

## 2024-04-01 NOTE — Telephone Encounter (Signed)
 No further action needed.

## 2024-04-02 ENCOUNTER — Encounter: Admitting: Family Medicine

## 2024-04-05 ENCOUNTER — Other Ambulatory Visit: Payer: Self-pay | Admitting: Family Medicine

## 2024-04-15 NOTE — Progress Notes (Unsigned)
 [facility] [address] [place] [address] Phone: [phone] Subjective:   Kelsey [name], am serving as a scribe for Dr. Arthea Claudene.  I'm seeing this patient by the request  of:  [name], [name], DO  CC: Low back pain follow-up  YEP:Dlagzrupcz  Kelsey [name] is a 78 y.o. female coming in with complaint of back and neck pain. OMT 03/10/2024. Patient states that her pain is worse due to renovating house. Has been doing a lot of heavy lifting.   Medications patient has been prescribed: None  Taking:     Reviewing patient's chart recently was seen by her primary care provider for possible infectious diarrhea.  Did travel out of the country.  Given ciprofloxacin .    Reviewed prior external information including notes and imaging from previsou exam, outside providers and external EMR if available.   As well as notes that were available from care everywhere and other healthcare systems.  Past medical history, social, surgical and family history all reviewed in electronic medical record.  No pertanent information unless stated regarding to the chief complaint.   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    Uterine fibroid 11/2012   See Dr. Darcel note, surgery 11/2012; calcified- multiple   Vaginitis 09/13/2011    Allergies  Allergen Reactions   Penicillins Rash    Has patient had a PCN reaction causing immediate rash, facial/tongue/throat swelling, SOB or lightheadedness with hypotension: no Has patient had a PCN reaction causing severe rash involving mucus membranes or skin necrosis: yes Has patient had a PCN reaction that required hospitalization: no Has patient had a PCN reaction occurring within the last 10 years: no If all of the above answers are NO, then may proceed with Cephalosporin use.    Sulfa Antibiotics Rash    Under arm and thighs     Review of Systems:  No headache, visual changes, nausea, vomiting, diarrhea, constipation, dizziness, abdominal pain, skin rash, fevers, chills, night sweats, weight loss, swollen lymph nodes, body aches, joint swelling, chest pain, shortness of breath, mood changes. POSITIVE muscle aches  Objective  Blood pressure 98/66, pulse 89, height 5' 4 (1.626 m), weight 107 lb (48.5 kg), SpO2 96%.   General: No apparent distress alert and oriented x3 mood and affect normal, dressed appropriately.  HEENT: Pupils equal, extraocular movements intact  Respiratory: Patient's speak in full sentences and does not appear short of breath  Cardiovascular: No lower extremity edema, non tender, no erythema  Gait MSK:  Back does have some significant  loss of lordosis.  With good core strength though for patient's age and body habitus.  Negative straight leg test but significant tightness.  Difficulty with extension of the back  Osteopathic findings  C3 flexed rotated and side bent right C5 flexed rotated and side bent left T3 extended rotated and side bent right inhaled rib T9 extended rotated and side bent left L2 flexed rotated and side bent right L3 flexed rotated and  side bent left Sacrum right on right       Assessment and Plan:  Degenerative lumbar spinal stenosis Known significant arthritic changes.  Muscle energy techniques used after further evaluation.  Discussed with patient to continue to work on core strengthening.  Discussed icing regimen.  Follow-up again in 6 to 8 weeks    Nonallopathic problems  Decision today to treat with OMT was based on Physical Exam  After verbal consent patient was treated with  ME, FPR techniques in cervical, rib, thoracic, lumbar, and sacral  areas  Patient tolerated the procedure well with improvement in symptoms  Patient given exercises, stretches and lifestyle modifications  See medications in patient instructions if given  Patient will follow up in 8 weeks     The above documentation has been reviewed and is accurate and complete Kelsey Luddy M Teague Goynes, DO         Note: This dictation was prepared with Dragon dictation along with smaller phrase technology. Any transcriptional errors that result from this process are unintentional.

## 2024-04-16 ENCOUNTER — Other Ambulatory Visit: Payer: Self-pay | Admitting: Family Medicine

## 2024-04-16 ENCOUNTER — Encounter: Payer: Self-pay | Admitting: Family Medicine

## 2024-04-16 ENCOUNTER — Ambulatory Visit (INDEPENDENT_AMBULATORY_CARE_PROVIDER_SITE_OTHER): Admitting: Family Medicine

## 2024-04-16 VITALS — BP 98/66 | HR 89 | Ht 64.0 in | Wt 107.0 lb

## 2024-04-16 DIAGNOSIS — M9904 Segmental and somatic dysfunction of sacral region: Secondary | ICD-10-CM | POA: Diagnosis not present

## 2024-04-16 DIAGNOSIS — M9901 Segmental and somatic dysfunction of cervical region: Secondary | ICD-10-CM

## 2024-04-16 DIAGNOSIS — M9903 Segmental and somatic dysfunction of lumbar region: Secondary | ICD-10-CM

## 2024-04-16 DIAGNOSIS — M9908 Segmental and somatic dysfunction of rib cage: Secondary | ICD-10-CM | POA: Diagnosis not present

## 2024-04-16 DIAGNOSIS — M9902 Segmental and somatic dysfunction of thoracic region: Secondary | ICD-10-CM

## 2024-04-16 DIAGNOSIS — M48061 Spinal stenosis, lumbar region without neurogenic claudication: Secondary | ICD-10-CM | POA: Diagnosis not present

## 2024-04-16 NOTE — Patient Instructions (Signed)
 Good to see you! Endodontist Ice back after activity See you again in 8 weeks

## 2024-04-16 NOTE — Assessment & Plan Note (Signed)
 Known significant arthritic changes.  Muscle energy techniques used after further evaluation.  Discussed with patient to continue to work on core strengthening.  Discussed icing regimen.  Follow-up again in 6 to 8 weeks

## 2024-05-22 NOTE — Progress Notes (Signed)
 [facility] [address] [place] [address] Phone: [phone] Subjective:   ISusannah [name], am serving as a scribe for Dr. Arthea Claudene.  I'm seeing this patient by the request  of:  [name], [name], DO  CC: back and neck pain follow up   YEP:Dlagzrupcz  Kelsey [name] is a 78 y.o. female coming in with complaint of back and neck pain. OMT 04/16/2024. Patient states doing well.  Has been swimming regularly.  Feels like that is very helpful for the lower back.  Having some tightness in the cervical spine more again.  Medications patient has been prescribed: None  Taking:         Reviewed prior external information including notes and imaging from previsou exam, outside providers and external EMR if available.   As well as notes that were available from care everywhere and other healthcare systems.  Past medical history, social, surgical and family history all reviewed in electronic medical record.  No pertanent information unless stated regarding to the chief complaint.   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     Uterine fibroid 11/2012   See Dr. Darcel note, surgery 11/2012; calcified- multiple   Vaginitis 09/13/2011    Allergies  Allergen Reactions   Penicillins Rash    Has patient had a PCN reaction causing immediate rash, facial/tongue/throat swelling, SOB or lightheadedness with hypotension: no Has patient had a PCN reaction causing severe rash involving mucus membranes or skin necrosis: yes Has patient had a PCN reaction that required hospitalization: no Has patient had a PCN reaction occurring within the last 10 years: no If all of the above answers are NO, then may proceed with Cephalosporin use.    Sulfa Antibiotics Rash    Under arm and thighs     Review of Systems:  No headache, visual changes, nausea, vomiting, diarrhea, constipation, dizziness, abdominal pain, skin rash, fevers, chills, night sweats, weight loss, swollen lymph nodes, body aches, joint swelling, chest pain, shortness of breath, mood changes. POSITIVE muscle aches  Objective  Blood pressure 108/66, pulse 68, height 5' 4 (1.626 m), SpO2 98%.   General: No apparent distress alert and oriented x3 mood and affect normal, dressed appropriately.  HEENT: Pupils equal, extraocular movements intact  Respiratory: Patient's speak in full sentences and does not appear short of breath  Cardiovascular: No lower extremity edema, non tender, no erythema  Does have significant loss of lordosis of the low lumbar spine still noted but improvement actually though with some rotational component of the lower  back.  Patient's neck exam significant tightness noted with very limited sidebending bilaterally.  Osteopathic findings  C2 flexed rotated and side bent right C5 flexed rotated and side bent right T3 extended rotated and side bent right inhaled rib T9 extended rotated and side bent left L2 flexed rotated and side bent right L5 flexed rotated and side bent left Sacrum right on right    Assessment and Plan:  Degenerative  lumbar spinal stenosis Known spinal stenosis, seems to be doing better with the range of motion at this moment.  Discussed icing regimen and home exercises, discussed which activities to do and which ones to avoid.  Increase activity slowly.  Discussed icing regimen.  Follow-up again in 6 to 12 weeks otherwise.  Patient has responded well to manipulation.  With patient's history of neck fracture has not done any thing more aggressive than muscle energy on the neck since the fracture.    Nonallopathic problems  Decision today to treat with OMT was based on Physical Exam  After verbal consent patient was treated with HVLA, ME, FPR techniques in cervical, rib, thoracic, lumbar, and sacral  areas, avoided any HVLA on the cervical spine  Patient tolerated the procedure well with improvement in symptoms  Patient given exercises, stretches and lifestyle modifications  See medications in patient instructions if given  Patient will follow up in 4-8 weeks    The above documentation has been reviewed and is accurate and complete Goran Olden M Everett Ehrler, DO          Note: This dictation was prepared with Dragon dictation along with smaller phrase technology. Any transcriptional errors that result from this process are unintentional.

## 2024-05-26 ENCOUNTER — Encounter: Payer: Self-pay | Admitting: Family Medicine

## 2024-05-26 ENCOUNTER — Ambulatory Visit: Admitting: Family Medicine

## 2024-05-26 VITALS — BP 108/66 | HR 68 | Ht 64.0 in

## 2024-05-26 DIAGNOSIS — M9904 Segmental and somatic dysfunction of sacral region: Secondary | ICD-10-CM | POA: Diagnosis not present

## 2024-05-26 DIAGNOSIS — M9901 Segmental and somatic dysfunction of cervical region: Secondary | ICD-10-CM | POA: Diagnosis not present

## 2024-05-26 DIAGNOSIS — M48061 Spinal stenosis, lumbar region without neurogenic claudication: Secondary | ICD-10-CM | POA: Diagnosis not present

## 2024-05-26 DIAGNOSIS — M9908 Segmental and somatic dysfunction of rib cage: Secondary | ICD-10-CM

## 2024-05-26 DIAGNOSIS — M9903 Segmental and somatic dysfunction of lumbar region: Secondary | ICD-10-CM | POA: Diagnosis not present

## 2024-05-26 DIAGNOSIS — M9902 Segmental and somatic dysfunction of thoracic region: Secondary | ICD-10-CM

## 2024-05-26 NOTE — Assessment & Plan Note (Signed)
 Known spinal stenosis, seems to be doing better with the range of motion at this moment.  Discussed icing regimen and home exercises, discussed which activities to do and which ones to avoid.  Increase activity slowly.  Discussed icing regimen.  Follow-up again in 6 to 12 weeks otherwise.  Patient has responded well to manipulation.  With patient's history of neck fracture has not done any thing more aggressive than muscle energy on the neck since the fracture.

## 2024-05-26 NOTE — Patient Instructions (Signed)
 See me in 6-8 weeks Buy something from [employer]

## 2024-06-09 ENCOUNTER — Encounter: Admitting: Family Medicine

## 2024-06-09 ENCOUNTER — Encounter: Payer: Self-pay | Admitting: Family Medicine

## 2024-06-09 NOTE — Patient Instructions (Signed)

## 2024-06-09 NOTE — Progress Notes (Unsigned)
 error

## 2024-06-12 ENCOUNTER — Encounter: Payer: Self-pay | Admitting: Family Medicine

## 2024-06-12 ENCOUNTER — Ambulatory Visit: Admitting: Family Medicine

## 2024-06-12 VITALS — BP 100/70 | HR 69 | Temp 98.1°F | Wt 109.0 lb

## 2024-06-12 DIAGNOSIS — B001 Herpesviral vesicular dermatitis: Secondary | ICD-10-CM | POA: Insufficient documentation

## 2024-06-12 DIAGNOSIS — R4589 Other symptoms and signs involving emotional state: Secondary | ICD-10-CM | POA: Diagnosis not present

## 2024-06-12 MED ORDER — VALACYCLOVIR HCL 1 G PO TABS: ORAL_TABLET | ORAL | 0 refills | Status: AC

## 2024-06-12 MED ORDER — ACYCLOVIR 5 % EX OINT: 1.0000 | TOPICAL_OINTMENT | CUTANEOUS | 1 refills | Status: AC

## 2024-06-12 MED ORDER — ATORVASTATIN CALCIUM 10 MG PO TABS: 10.0000 mg | ORAL_TABLET | Freq: Every evening | ORAL | 3 refills | Status: AC

## 2024-06-12 NOTE — Patient Instructions (Signed)
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
## 2024-06-15 ENCOUNTER — Encounter (HOSPITAL_BASED_OUTPATIENT_CLINIC_OR_DEPARTMENT_OTHER): Payer: Self-pay

## 2024-06-15 ENCOUNTER — Inpatient Hospital Stay (HOSPITAL_BASED_OUTPATIENT_CLINIC_OR_DEPARTMENT_OTHER)
Admission: RE | Admit: 2024-06-15 | Discharge: 2024-06-15 | Disposition: A | Source: Ambulatory Visit | Attending: Family Medicine | Admitting: Family Medicine

## 2024-06-15 DIAGNOSIS — Z1231 Encounter for screening mammogram for malignant neoplasm of breast: Secondary | ICD-10-CM

## 2024-06-30 NOTE — Progress Notes (Signed)
 " [name] [address] [place] [address] Phone: [phone] Subjective:   Kelsey [name], am serving as a scribe for Dr. Arthea Claudene.  I'm seeing this patient by the request  of:  [name], [name], DO  CC:   YEP:Dlagzrupcz  Kelsey [name] is a 78 y.o. female coming in with complaint of back and neck pain. OMT on 05/26/2024. Patient states that her back is about the same as last visit. Patient concerned about recent mammogram.   Medications patient has been prescribed:   Taking:         Reviewed prior external information including notes and imaging from previsou exam, outside providers and external EMR if available.   As well as notes that were available from care everywhere and other healthcare systems.  Past medical history, social, surgical and family history all reviewed in electronic medical record.  No pertanent information unless stated regarding to the chief complaint.   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   Bladder prolapse, female, acquired    Chicken pox as a child   Closed fracture of transverse process of cervical vertebra (HCC) 02/15/2022   De Quervain's tenosynovitis, left 12/11/2017   Dysuria 09/13/2011   Female bladder prolapse 09/13/2011   Fibrocystic breast 09/14/2011   GERD (gastroesophageal reflux disease)    H/O measles    H/O mumps    History of iron deficiency anemia 12/02/2013   Hyperlipidemia    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    Trigger finger,  acquired 01/25/2021   Uterine fibroid 11/2012   See Dr. Darcel note, surgery 11/2012; calcified- multiple   Vaginitis 09/13/2011    Allergies[1]   Review of Systems:  No headache, visual changes, nausea, vomiting, diarrhea, constipation, dizziness, abdominal pain, skin rash, fevers, chills, night sweats, weight loss, swollen lymph nodes, body aches, joint swelling, chest pain, shortness of breath, mood changes. POSITIVE muscle aches  Objective  Blood pressure 96/70, pulse 85, height 5' 4 (1.626 m), weight 110 lb (49.9 kg), SpO2 97%.   General: No apparent distress alert and oriented x3 mood and affect normal, dressed appropriately.  HEENT: Pupils equal, extraocular movements intact  Respiratory: Patient's speak in full sentences and does not appear short of breath  Cardiovascular: No lower extremity edema, non tender, no erythema  Low back does have some loss of lordosis.  Tenderness to palpation in the paraspinal musculature.  Tightness noted in the thoracolumbar junction as well as in the lumbosacral area.  Only has 5 degrees of extension.  Osteopathic findings  T3 extended rotated and side bent right inhaled rib T9 extended rotated and side bent left L2 flexed rotated and side bent right Sacrum right on right     Assessment and Plan:  Degenerative lumbar spinal stenosis Significant spinal stenosis noted.  Discussed which activities to do and which ones to avoid.  Increase activity slowly.  Has done well with conservative therapy.  Discussed icing regimen.  Follow-up  again in 6 to 12 weeks    Nonallopathic problems  Decision today to treat with OMT was based on Physical Exam  After verbal consent patient was treated with HVLA, ME, FPR techniques in  rib, thoracic, lumbar, and sacral  areas  Patient tolerated the procedure well with improvement in symptoms  Patient given exercises, stretches and lifestyle modifications  See medications in patient instructions if  given  Patient will follow up in 4-8 weeks     The above documentation has been reviewed and is accurate and complete Kelsey Middlekauff M Kelsey Scobee, DO         Note: This dictation was prepared with Dragon dictation along with smaller phrase technology. Any transcriptional errors that result from this process are unintentional.            [1]  Allergies Allergen Reactions   Penicillins Rash    Has patient had a PCN reaction causing immediate rash, facial/tongue/throat swelling, SOB or lightheadedness with hypotension: no Has patient had a PCN reaction causing severe rash involving mucus membranes or skin necrosis: yes Has patient had a PCN reaction that required hospitalization: no Has patient had a PCN reaction occurring within the last 10 years: no If all of the above answers are NO, then may proceed with Cephalosporin use.    Sulfa Antibiotics Rash    Under arm and thighs   "

## 2024-07-06 ENCOUNTER — Encounter: Payer: Self-pay | Admitting: Family Medicine

## 2024-07-06 ENCOUNTER — Ambulatory Visit: Admitting: Family Medicine

## 2024-07-06 VITALS — BP 96/70 | HR 85 | Ht 64.0 in | Wt 110.0 lb

## 2024-07-06 DIAGNOSIS — M48061 Spinal stenosis, lumbar region without neurogenic claudication: Secondary | ICD-10-CM | POA: Diagnosis not present

## 2024-07-06 DIAGNOSIS — M9904 Segmental and somatic dysfunction of sacral region: Secondary | ICD-10-CM

## 2024-07-06 DIAGNOSIS — M9902 Segmental and somatic dysfunction of thoracic region: Secondary | ICD-10-CM | POA: Diagnosis not present

## 2024-07-06 DIAGNOSIS — M9903 Segmental and somatic dysfunction of lumbar region: Secondary | ICD-10-CM | POA: Diagnosis not present

## 2024-07-06 NOTE — Assessment & Plan Note (Signed)
 Significant spinal stenosis noted.  Discussed which activities to do and which ones to avoid.  Increase activity slowly.  Has done well with conservative therapy.  Discussed icing regimen.  Follow-up again in 6 to 12 weeks

## 2024-07-06 NOTE — Patient Instructions (Signed)
 Great to see you Enjoy those long noodles See me in 8-10 months

## 2024-07-10 ENCOUNTER — Telehealth: Payer: Self-pay

## 2024-07-10 ENCOUNTER — Ambulatory Visit

## 2024-07-10 ENCOUNTER — Other Ambulatory Visit (HOSPITAL_COMMUNITY): Payer: Self-pay

## 2024-07-10 NOTE — Telephone Encounter (Addendum)
 Prolia VOB initiated via [email or web address]  Next Prolia inj DUE: NOW

## 2024-07-14 ENCOUNTER — Other Ambulatory Visit (HOSPITAL_COMMUNITY): Payer: Self-pay

## 2024-07-14 NOTE — Telephone Encounter (Signed)
" ° °  [employer] cannot give estimate, following Medicare Advantage guideline (20% coinsurance, 20% admin fee)   "

## 2024-07-14 NOTE — Telephone Encounter (Signed)
 MEDICAL PA SUBMITTED VIA LATENT. KEY: [id number]   APPROVED

## 2024-07-15 ENCOUNTER — Other Ambulatory Visit (HOSPITAL_COMMUNITY): Payer: Self-pay

## 2024-07-15 ENCOUNTER — Ambulatory Visit (INDEPENDENT_AMBULATORY_CARE_PROVIDER_SITE_OTHER)

## 2024-07-15 DIAGNOSIS — M810 Age-related osteoporosis without current pathological fracture: Secondary | ICD-10-CM | POA: Diagnosis not present

## 2024-07-15 NOTE — Progress Notes (Signed)
 Pt in for Prolia  injection  Injection tolerated well  Next injection scheduled for 6 months.

## 2024-07-15 NOTE — Telephone Encounter (Signed)
 Pt ready for scheduling for PROLIA  on or after : 07/15/24  Option# 1: Buy/Bill (Office supplied medication)  Out-of-pocket cost due at time of clinic visit: $377  Number of injection/visits approved: 2  Primary: [employer] Prolia  co-insurance: 20% Admin fee co-insurance: 20%  Secondary: --- Prolia  co-insurance:  Admin fee co-insurance:   Medical Benefit Details: Date Benefits were checked: 07/14/24 Deductible: NO/ Coinsurance: 20%/ Admin Fee: 20%  Prior Auth: APPROVED PA# [id number] Expiration Date: 07/14/24-07/08/25  # of doses approved: 2 ----------------------------------------------------------------------- Option# 2- Med Obtained from pharmacy:  Pharmacy benefit: Copay $--- (Paid to pharmacy) Admin Fee: --- (Pay at clinic)  Prior Auth: --- PA# Expiration Date:   # of doses approved:   If patient wants fill through the pharmacy benefit please send prescription to: ---, and include estimated need by date in rx notes. Pharmacy will ship medication directly to the office.  Patient NOT eligible for Prolia  Copay Card. Copay Card can make patient's cost as little as $25. Link to apply: [email or web address]  ** This summary of benefits is an estimation of the patient's out-of-pocket cost. Exact cost may very based on individual plan coverage.

## 2024-07-27 ENCOUNTER — Ambulatory Visit: Admitting: Psychology

## 2024-07-27 DIAGNOSIS — F4321 Adjustment disorder with depressed mood: Secondary | ICD-10-CM

## 2024-07-27 DIAGNOSIS — F909 Attention-deficit hyperactivity disorder, unspecified type: Secondary | ICD-10-CM | POA: Diagnosis not present

## 2024-07-27 DIAGNOSIS — F411 Generalized anxiety disorder: Secondary | ICD-10-CM | POA: Diagnosis not present

## 2024-07-27 NOTE — Progress Notes (Signed)
 "                                                            Main Office Phone: [phone] Fax: [phone]   Date: July 27, 2024  Name: Kelsey [name]  MRN: [id number] Appointment Start Time: 9:06am Duration: 63 minutes Provider: Wyatt Fire, Psy.D. Type of Session: Intake for Individual Therapy  Type of Contact: In-Person   Informed Consent: The provider's role was explained to Kelsey [name]. The provider reviewed and discussed issues of confidentiality, privacy, and limits therein (e.g., reporting obligations). In addition to verbal informed consent, written informed consent for psychological services was obtained prior to the initial appointment. Since the clinic is not a 24/7 crisis center, mental health emergency resources were shared and this  provider explained MyChart, e-mail, voicemail, and/or other messaging systems should be utilized only for non-emergency reasons. This provider also explained that information obtained during appointments will be securely placed in Adriane's medical record. Kelsey [name] verbally consented to proceed. All questions/concerns related to new patient paperwork were addressed.   Chief Complaint/HPI: [name] was referred by Charlies Bellini, DO due to ADHD and stress  During today's appointment, Kelsey [name] reported ongoing conflict with friends and family. She shared she went on a cruise last year from [place], noting she made many friends. She stated three friends from the cruise came to visit her in [place], adding the visit went well; however, she noted issues after their departure. Kelsey [name] explained her home was under renovation at the time and many of her belongings were easily accessible. Her friends that were visiting reportedly sorted through her belongings with out her consent and used her clothes in the guest room as rags and also made rugs. She further shared feeling judged by her friend's comments. Additionally, Kelsey [name] shared her friends left money  for the days they stayed, noting It hurt my feelings. The visit left her feeling not good enough and inadequate. She indicated she received multiple calls that she ignored, noting a plan to travel to [place] in April where she will see them. Additionally, she will see them for a cruise in August. Regarding family, Kelsey [name] shared her sister resides in [place] and described having a positive relationship with her; however, she discussed an incident that occurred on New Year's Eve. She described feeling left out as the plans did not go as agreed upon. Reactions to recent conflicts made her feel immature. Additionally, she discussed ongoing stressors related to her son, as she often has to financially support him. As such, Kelsey [name] expressed a desire to learn effective communication to address the abovementioned concerns with her friends and family. Furthermore, Kelsey [name] reported experiencing the following: procrastination; difficulty organizing; difficulty with time management; and forgetfulness. She was reportedly diagnosed by Dr. Bellini by ADHD last year and was referred to [facility] to Tawni Louder, [facility] last year for therapeutic services. She did not complete an ADHD evaluation.   Mental Status Examination:  Appearance: neat Behavior: appropriate to circumstances Mood: neutral Affect: mood congruent Speech: WNL Eye Contact: intermittent Psychomotor Activity: WNL Gait: WNL Thought Process: linear, logical, and goal directed and denies suicidal, homicidal, and self-harm ideation, plan and intent  Thought Content/Perception: no hallucinations, delusions, bizarre thinking or behavior endorsed or observed Orientation: AAOx4 Memory/Concentration: intact Insight/Judgment: fair  Family & Psychosocial History: Kelsey [name] reported she is widowed, noting her husband passed away two years ago. She stated she has one adult son. She indicated she is currently retired. Additionally, Kelsey [name] shared  her highest level of education obtained is a bachelor's degree. Currently, Kelsey [name]'s social support system consists of her older sister, noting She is my guiding light. She further shared her neighbors and son are also part of her support system. Moreover, [name] stated she resides with her dog Kelsey [name]). The following hobbies were identified: yoga, tai chi, and swim.   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   Bladder prolapse, female, acquired    Chicken pox as a child   Closed fracture of transverse process of cervical vertebra (HCC) 02/15/2022   De Quervain's tenosynovitis, left 12/11/2017   Dysuria 09/13/2011   Female bladder prolapse 09/13/2011   Fibrocystic breast 09/14/2011   GERD (gastroesophageal reflux disease)    H/O measles    H/O mumps    History of iron deficiency anemia 12/02/2013   Hyperlipidemia    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    Trigger finger, acquired 01/25/2021   Uterine fibroid 11/2012   See Dr. Darcel note, surgery 11/2012; calcified- multiple   Vaginitis 09/13/2011    Past Surgical History:  Procedure Laterality Date   DILATATION & CURRETTAGE/HYSTEROSCOPY WITH RESECTOCOPE N/A 11/06/2012   Procedure: DILATATION & CURETTAGE/HYSTEROSCOPY WITH RESECTOCOPE;  Surgeon: Nena DELENA Darcel, MD;  Location: [facility] OR[facility];  Service: Gynecology;  Laterality: N/A;   HERNIA REPAIR  1992   I & D EXTREMITY Left 03/08/2021   Procedure: IRRIGATION AND DEBRIDEMENT OF THUMB AND ARM;  Surgeon: Murrell Drivers, MD;  Location: [facility]  OR;  Service: Orthopedics;  Laterality: Left;   small intestine hernia  1994   WISDOM TOOTH EXTRACTION      Current Outpatient Medications  Medication Sig Dispense Refill   acyclovir  ointment (ZOVIRAX ) 5 % Apply 1 Application topically every 3 (three) hours. 15 g 1   atorvastatin  (LIPITOR) 10 MG tablet Take 1 tablet (10 mg total) by mouth every evening. 90 tablet 3   Calcium  Carbonate-Vit D-Min (CALCIUM  1200 PO) Take 1,200 mg by mouth daily.     omeprazole  (PRILOSEC) 40 MG capsule TAKE 1 CAPSULE DAILY 90 capsule 3   valACYclovir  (VALTREX ) 1000 MG tablet 2 tabs by mouth at onset of symptoms, and 2 tabs 12 hours later 8 tablet 0   Vitamin D , Ergocalciferol , (DRISDOL ) 1.25 MG (50000 UNIT) CAPS capsule TAKE 1 CAPSULE EVERY 7 DAYS 12 capsule 3   Current Facility-Administered Medications  Medication Dose Route Frequency Provider Last Rate Last Admin   denosumab  (PROLIA ) injection 60 mg  60 mg Subcutaneous Q6 months [name], [name], DO   60 mg at 07/15/24 1549   Allergies[1]  Mental Health History: Detria reported she attended one appointment with Tawni Louder, [facility] last year, but stopped attending as she left for a cruise. Chart review revealed the following diagnoses by [facility]: Anxiety  and ADHD, Unspecified. Chart review also revealed she underwent a neuropsychological evaluation in 2022 and there were no significant findings related to cognitive concerns. Additionally, chart review further revealed diagnosis of anxiety by other providers. She reported there is no history of psychotropic medications. Donnamae reported there is no history of hospitalizations for psychiatric concerns. Jannett denied a family history of mental health/substance abuse related concerns. Furthermore, Ronnell disclosed a history of emotional trauma. She recalled her son is diagnosed with ADHD and he was sent to a special school, adding her son hitchhiked from [place]  to [place] . The school reportedly  informed Aaryana, Your son is going to be a criminal. She denied a history of physical  and sexual abuse, as well as neglect.   Sade endorsed current alcohol use (1-2 standard drinks approximately few times a month).  She denied current tobacco use. She denied illicit/recreational substance use. Furthermore, Fontaine indicated she is not experiencing the following: hallucinations and delusions, paranoia, and symptoms of mania . She also denied history of and current suicidal ideation, plan, and intent; history of and current homicidal ideation, plan, and intent; and history of and current engagement in self-harm. While future psychiatric events cannot be accurately predicted, the patient does not currently require acute inpatient psychiatric care and does not currently meet [place]  involuntary commitment criteria.  Legal History: Alysabeth reported there is no history of legal involvement.   Structured Assessments Results: The Patient Health Questionnaire-9 (PHQ-9) is a self-report measure that assesses symptoms and severity of depression over the course of the last two weeks. Jermia obtained a score of 10 suggesting moderate depression. Altagracia finds the endorsed symptoms to be very difficult. [0= Not at all; 1= Several days; 2= More than half the days; 3= Nearly every day] Little interest or pleasure in doing things 0  Feeling down, depressed, or hopeless 3  Trouble falling or staying asleep, or sleeping too much 0  Feeling tired or having little energy 1  Poor appetite or overeating 0  Feeling bad about yourself --- or that you are a failure or have let yourself or your family down 3  Trouble concentrating on things, such as reading the newspaper or watching television 3  Moving or speaking so slowly that other people could have noticed? Or the opposite --- being so fidgety or restless that you have been moving around a lot more than usual 0  Thoughts that you would be better off dead or  hurting yourself in some way 0  PHQ-9 Score 10    The Generalized Anxiety Disorder-7 (GAD-7) is a brief self-report measure that assesses symptoms of anxiety over the course of the last two weeks. Mikinzie obtained a score of 9 suggesting mild anxiety. Nitika finds the endorsed symptoms to be somewhat difficult. [0= Not at all; 1= Several days; 2= Over half the days; 3= Nearly every day] Feeling nervous, anxious, on edge 0  Not being able to stop or control worrying 1  Worrying too much about different things 3  Trouble relaxing 3  Being so restless that it's hard to sit still 0  Becoming easily annoyed or irritable 2  Feeling afraid as if something awful might happen 0  GAD-7 Score 9   Interventions:  Conducted a chart review Focused on rapport building Verbally administered PHQ-9 and GAD-7 for symptom monitoring Provided emphatic reflections and validation Conducted a clinical interview  Provisional DSM-5 Diagnosis(es): Cordia discussed recent conflicts with friends and family, and described experiencing social withdrawal, decreased  mood, and other items endorsed on the PHQ-9. Chart review revealed a history of anxiety preceding the aforementioned conflicts and she endorsed various items on the GAD-7. Furthermore, Randall reported she was diagnosed with ADHD by her PCP, Dr. Catherine. She denied a hx of medication for ADHD-related concerns and attended an initial appointment with a therapist. During today's appointment, she endorsed experiencing the following ADHD-related symptoms: procrastination; difficulty organizing; difficulty with time management; and forgetfulness. 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 The diagnosis of Unspecified Attention-Deficit/Hyperactivity Disorder was assigned based on Glorianna's self-report and chart review as this  provider did not assess/evaluate for a diagnosis of ADHD.  Plan: Cailan appears able and willing to participate as evidenced engagement in reciprocal conversation and asking questions as needed for clarification. The next appointment is scheduled for 08/03/2024 at 9am, which will be in-person. For homework, Anber was encouraged to reflect on goal(s) for treatment, as a treatment plan will be developed at the next appointment. This provider will regularly review the treatment plan and medical chart to keep informed of status changes.    Wyatt SHAUNNA Fire, PsyD         [1]  Allergies Allergen Reactions   Penicillins Rash    Has patient had a PCN reaction causing immediate rash, facial/tongue/throat swelling, SOB or lightheadedness with hypotension: no Has patient had a PCN reaction causing severe rash involving mucus membranes or skin necrosis: yes Has patient had a PCN reaction that required hospitalization: no Has patient had a PCN reaction occurring within the last 10 years: no If all of the above answers are NO, then may proceed with Cephalosporin use.    Sulfa Antibiotics Rash    Under arm and thighs   "

## 2024-08-03 ENCOUNTER — Ambulatory Visit: Admitting: Psychology

## 2024-08-03 DIAGNOSIS — F4321 Adjustment disorder with depressed mood: Secondary | ICD-10-CM

## 2024-08-03 DIAGNOSIS — F411 Generalized anxiety disorder: Secondary | ICD-10-CM

## 2024-08-03 DIAGNOSIS — F909 Attention-deficit hyperactivity disorder, unspecified type: Secondary | ICD-10-CM

## 2024-08-03 NOTE — Progress Notes (Addendum)
 "                                        Main Office Phone: [phone] Fax: [phone]                                                             Behavioral Health Treatment Plan   Name: Kelsey [name] Insurance: [facility] MEDICARE (ID: [id number]; Group #: [id number])  MRN: [id number]  Treatment Plan Development Date: August 03, 2024   Strengths: Spirituality, culture, hopefulness  Supports: Friends, son, and sister  Theatre Manager of Needs: Kelsey [name] shared a desire to be able to confidently chat with others. She also expressed desire to establish boundaries and express her needs.   Treatment Level: Outpatient individual therapeutic services   Client Treatment Preferences: Kelsey [name] expressed desire to have assistance with problem solving and processing events. She requested to meet with this provider every 1-2 weeks to address identified goals.  Diagnosis: Adjustment Disorder, With Depressed Mood  Symptoms: The development of emotional or behavioral symptoms in response to an identifiable stressor(s) occurring within 3 months of the onset of the stressor(s), including feeling of inadequacy and low mood.  Goal:  Improve mood and reduce stress using cognitive and behavioral techniques to manage symptoms  Objectives (Target Date For All Objectives: 08/03/2025):  Enhance problem-solving abilities and develop skills to address and resolve challenges Interrupt negative thinking and increase positive thoughts and feelings Practice mindfulness  Progress Documentation: Initial treatment plan    Interventions: Eclectic therapeutic approach utilizing techniques from Cognitive Behavioral Therapy (CBT), Client Centered Therapy, Dialectical Behavior Therapy (CBT), Interpersonal Therapy, and Acceptance and Commitment Therapy (ACT); therapeutic approach will include various interventions, such as validation, reflections, mindfulness, thought defusion, cognitive reframing, boundary setting,  values assessment, psychoeducation, motivational interviewing, relaxation training, goal setting, problem solving  Diagnosis: Unspecified Attention-Deficit/Hyperactivity Disorder  Symptoms: Often fails to give close attention to details or makes careless mistakes in schoolwork, at work, or during other activities (e.g., overlooks or misses details, work is inaccurate)., Often has difficulty organizing tasks and activities (e.g., difficulty managing sequential tasks; difficulty keeping materials and belongings in order; messy, disorganized work; has poor time management; fails to meet deadlines)., Often avoids, dislikes, or is reluctant to engage in tasks that require sustained mental effort (e.g., schoolwork or homework; for older adolescents and adults, preparing reports, completing forms, reviewing lengthy papers)., Is often forgetful in daily activities (e.g., doing chores, running errands; for older adolescents and adults, returning calls, paying bills, keeping appointments)., Is often on the go acting as if driven by a motor (e.g., is unable to be or uncomfortable being still for extended time, as in restaurants, meetings; may be experienced by others as being restless or difficult to keep up with)., and Often has trouble waiting his/her turn (e.g., while waiting in line).  Goals: Increase understand\ing regarding ADHD diagnosis, treatment, and need for various interventions Learn coping strategies to reduce effect of symptoms on day-to-day functioning  Objectives (Target Date For All Objectives: 08/03/2025):  Provide feedback regarding past and current symptoms of ADHD and their effects on day-to-day functioning Receive psychoeducation regarding ADHD diagnosis via therapeutic process, handouts, and literature Learn and consistently employ the  necessary tools and skills to manage ADHD symptoms including creating the use of reminders, planners, organizational skills Identify, challenge, and  manage negative self-talk that contribute to dysfunctional feelings and behaviors  Progress Documentation: Initial treatment plan  Interventions: Eclectic therapeutic approach utilizing techniques from Cognitive Behavioral Therapy (CBT), Client Centered Therapy, Dialectical Behavior Therapy (CBT), Interpersonal Therapy, and Acceptance and Commitment Therapy (ACT); therapeutic approach will include various interventions, such as validation, reflections, mindfulness, thought defusion, cognitive reframing, boundary setting, values assessment, psychoeducation, motivational interviewing, relaxation training, goal setting, problem solving  Diagnosis: Generalized Anxiety Disorder  Symptoms:Excessive and/or unrealistic worry that is difficult to control occurring more days than not for at least 6 months about a number of events or activities, Difficulty managing worry, and Difficulty concentrating or mind going blank  Goals: Improve daily functioning by reducing overall anxiety symptoms including intensity, frequency, and duration of symptoms Learn and employ coping strategies and skills to reduce symptoms of anxiety, worry, and improve functioning day-to-day Address negative cognitions, distortions, and behaviors that contribute to anxiety symptoms and affect overall functioning  Objectives (Target Date For All Objectives: 08/03/2025):  Express understanding of anxiety diagnosis, treatment approaches and the need to address the thoughts, feelings, behaviors, and physiological symptoms associated with the symptoms and diagnosis Develop coping tools to manage anxiety, such as mindfulness, acceptance, relaxation, reframing, and challenging negative thoughts and feelings Identify, challenge, and manage negative self-talk, negative thinking about self and others, and maintain mindfulness regarding self-talk and cognitive distortions  Progress Documentation: Initial treatment plan  Interventions: Eclectic  therapeutic approach utilizing techniques from Cognitive Behavioral Therapy (CBT), Client Centered Therapy, Dialectical Behavior Therapy (CBT), Interpersonal Therapy, and Acceptance and Commitment Therapy (ACT); therapeutic approach will include various interventions, such as validation, reflections, mindfulness, thought defusion, cognitive reframing, boundary setting, values assessment, psychoeducation, motivational interviewing, relaxation training, goal setting, problem solving  Expected duration of treatment: 12 months  Party responsible for implementation of interventions: Kelsey [name] (patient) and Wyatt SHAUNNA Fire, PsyD. (Provider)  This plan has been reviewed and created by the following participants: Kelsey [name] (patient) and Wyatt SHAUNNA Fire, PsyD. (Provider)  A new plan will be created at least every 12 months.  The patient fully participated in the development of treatment plan with the provider and verbally consents to such treatment.   Patient Treatment Plan Signature Obtained: Kelsey [name] provided verbal approval of this treatment plan and provided verbal consent to proceed with services. This treatment plan will be sent to Lila electronically to be signed and returned prior to the next appointment.    Wyatt SHAUNNA Fire, PsyD     "

## 2024-08-03 NOTE — Progress Notes (Addendum)
" ° ° °                                     Main Office Phone: [phone] Fax: [phone]  Date: August 03, 2024  Name: Kelsey [name]. [place] MRN: [id number] Appointment Start Time: 9:00am Duration: 66 minutes Type of Session: Individual Therapy  Location of Patient: Home Location of Provider: Provider's Home (private office) Type of Contact: Telepsychological Visit via Caregility (video & audio capabilities)  Session Content: Today's appointment was a telepsychological visit. Jayme provided verbal consent for today's telepsychological appointment and she is aware she is responsible for securing confidentiality on her end of the session. Prior to proceeding with today's appointment, [name]'s physical location at the time of this appointment was obtained as well a phone number she could be reached at in the event of technical difficulties. Waldine and this provider participated in today's telepsychological service.   This provider and [name] established an agenda for today's appointment, which included development of a treatment plan and processing a recent event. [name] identified a desire to focus on the following goal: confidently chat with others. This was further explored and processed. Other goals were determined. Psychoeducation provided regarding Ciclaly's diagnoses. Developed treatment plan. Genecis provided verbal approval of the treatment plan and acknowledged understanding that a form will be sent electronically to sign and return prior to the next appointment. Additionally, Julicia shared about a recent event with her son. Associated thoughts and feelings were explored and processed. Remainder of today's appointment focused on encouraging [name] to identify aspects of the challenges with her relationship with her son that she can control and what is outside of her control. She was encouraged to reflect further for homework. Overall, [name] was receptive to today's appointment as evidenced by  openness to sharing, responsiveness to feedback, and willingness to reflect on her role within parent-child relationship.  Mental Status Examination:  Appearance: neat Behavior: appropriate to circumstances Mood: anxious Affect: mood congruent Speech: WNL Eye Contact: appropriate Psychomotor Activity: appropriate  Gait: unable to assess Thought Process: linear, logical, and goal directed and no evidence or endorsement of suicidal, homicidal, and self-harm ideation, plan and intent  Thought Content/Perception: no hallucinations, delusions, bizarre thinking or behavior endorsed or observed Orientation: AAOx4 Memory/Concentration: intact Insight/Judgment: fair  Interventions:  Conducted a brief chart review Provided empathic reflections and validation Collaborated with patient to develop a treatment plan Processed thoughts and feelings Provided positive reinforcement   DSM-5 Diagnosis(es): 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 Treatment plan is attached to this encounter.   Plan: The next appointment is scheduled on 08/10/2024 at 1pm via Caregility. The next session will focus on working towards the established treatment goals. [name] will electronically sign and return treatment plan.     Wyatt SHAUNNA Fire, PsyD  "

## 2024-08-10 ENCOUNTER — Ambulatory Visit: Admitting: Psychology

## 2024-08-10 DIAGNOSIS — F411 Generalized anxiety disorder: Secondary | ICD-10-CM

## 2024-08-10 DIAGNOSIS — F909 Attention-deficit hyperactivity disorder, unspecified type: Secondary | ICD-10-CM

## 2024-08-10 DIAGNOSIS — F4321 Adjustment disorder with depressed mood: Secondary | ICD-10-CM

## 2024-08-18 ENCOUNTER — Ambulatory Visit: Admitting: Psychology

## 2024-08-18 ENCOUNTER — Ambulatory Visit: Admitting: Family Medicine

## 2024-10-21 ENCOUNTER — Encounter

## 2025-01-11 ENCOUNTER — Ambulatory Visit
